# Patient Record
Sex: Female | Born: 1935 | Race: White | Hispanic: No | Smoking: Never smoker
Health system: Southern US, Community
[De-identification: ages and names within clinical notes are randomized; demographics above are authoritative.]

## PROBLEM LIST (undated history)

## (undated) DIAGNOSIS — M1990 Unspecified osteoarthritis, unspecified site: Secondary | ICD-10-CM

## (undated) DIAGNOSIS — D649 Anemia, unspecified: Secondary | ICD-10-CM

## (undated) DIAGNOSIS — E785 Hyperlipidemia, unspecified: Secondary | ICD-10-CM

## (undated) DIAGNOSIS — S32511A Fracture of superior rim of right pubis, initial encounter for closed fracture: Secondary | ICD-10-CM

## (undated) DIAGNOSIS — F419 Anxiety disorder, unspecified: Secondary | ICD-10-CM

## (undated) DIAGNOSIS — O2230 Deep phlebothrombosis in pregnancy, unspecified trimester: Secondary | ICD-10-CM

## (undated) DIAGNOSIS — Z950 Presence of cardiac pacemaker: Secondary | ICD-10-CM

## (undated) DIAGNOSIS — I517 Cardiomegaly: Secondary | ICD-10-CM

## (undated) DIAGNOSIS — S72142A Displaced intertrochanteric fracture of left femur, initial encounter for closed fracture: Secondary | ICD-10-CM

## (undated) DIAGNOSIS — F1020 Alcohol dependence, uncomplicated: Secondary | ICD-10-CM

## (undated) DIAGNOSIS — F32A Depression, unspecified: Secondary | ICD-10-CM

## (undated) DIAGNOSIS — S32059A Unspecified fracture of fifth lumbar vertebra, initial encounter for closed fracture: Secondary | ICD-10-CM

## (undated) DIAGNOSIS — I219 Acute myocardial infarction, unspecified: Secondary | ICD-10-CM

## (undated) DIAGNOSIS — M4800 Spinal stenosis, site unspecified: Secondary | ICD-10-CM

## (undated) DIAGNOSIS — G4700 Insomnia, unspecified: Secondary | ICD-10-CM

## (undated) DIAGNOSIS — I7090 Unspecified atherosclerosis: Secondary | ICD-10-CM

## (undated) DIAGNOSIS — S329XXA Fracture of unspecified parts of lumbosacral spine and pelvis, initial encounter for closed fracture: Secondary | ICD-10-CM

## (undated) DIAGNOSIS — I82409 Acute embolism and thrombosis of unspecified deep veins of unspecified lower extremity: Secondary | ICD-10-CM

## (undated) DIAGNOSIS — S5291XA Unspecified fracture of right forearm, initial encounter for closed fracture: Secondary | ICD-10-CM

## (undated) DIAGNOSIS — R2681 Unsteadiness on feet: Secondary | ICD-10-CM

## (undated) DIAGNOSIS — I2510 Atherosclerotic heart disease of native coronary artery without angina pectoris: Secondary | ICD-10-CM

## (undated) DIAGNOSIS — M21371 Foot drop, right foot: Secondary | ICD-10-CM

## (undated) DIAGNOSIS — K219 Gastro-esophageal reflux disease without esophagitis: Secondary | ICD-10-CM

## (undated) DIAGNOSIS — G319 Degenerative disease of nervous system, unspecified: Secondary | ICD-10-CM

## (undated) DIAGNOSIS — K5900 Constipation, unspecified: Secondary | ICD-10-CM

## (undated) DIAGNOSIS — F329 Major depressive disorder, single episode, unspecified: Secondary | ICD-10-CM

## (undated) DIAGNOSIS — C50919 Malignant neoplasm of unspecified site of unspecified female breast: Secondary | ICD-10-CM

## (undated) DIAGNOSIS — S3210XA Unspecified fracture of sacrum, initial encounter for closed fracture: Secondary | ICD-10-CM

## (undated) DIAGNOSIS — Z9181 History of falling: Secondary | ICD-10-CM

## (undated) DIAGNOSIS — E559 Vitamin D deficiency, unspecified: Principal | ICD-10-CM

## (undated) DIAGNOSIS — S32009A Unspecified fracture of unspecified lumbar vertebra, initial encounter for closed fracture: Secondary | ICD-10-CM

## (undated) DIAGNOSIS — Z7901 Long term (current) use of anticoagulants: Secondary | ICD-10-CM

## (undated) DIAGNOSIS — N390 Urinary tract infection, site not specified: Secondary | ICD-10-CM

## (undated) DIAGNOSIS — M21372 Foot drop, left foot: Secondary | ICD-10-CM

## (undated) DIAGNOSIS — I679 Cerebrovascular disease, unspecified: Secondary | ICD-10-CM

## (undated) DIAGNOSIS — R001 Bradycardia, unspecified: Secondary | ICD-10-CM

## (undated) DIAGNOSIS — I209 Angina pectoris, unspecified: Secondary | ICD-10-CM

## (undated) DIAGNOSIS — E538 Deficiency of other specified B group vitamins: Principal | ICD-10-CM

## (undated) DIAGNOSIS — C801 Malignant (primary) neoplasm, unspecified: Secondary | ICD-10-CM

## (undated) DIAGNOSIS — I2699 Other pulmonary embolism without acute cor pulmonale: Secondary | ICD-10-CM

## (undated) DIAGNOSIS — I10 Essential (primary) hypertension: Secondary | ICD-10-CM

## (undated) HISTORY — DX: Unspecified fracture of unspecified lumbar vertebra, initial encounter for closed fracture: S32.009A

## (undated) HISTORY — DX: Unsteadiness on feet: R26.81

## (undated) HISTORY — DX: Constipation, unspecified: K59.00

## (undated) HISTORY — PX: HIP FRACTURE SURGERY: SHX118

## (undated) HISTORY — DX: Displaced intertrochanteric fracture of left femur, initial encounter for closed fracture: S72.142A

## (undated) HISTORY — DX: Vitamin D deficiency, unspecified: E55.9

## (undated) HISTORY — DX: Fracture of superior rim of right pubis, initial encounter for closed fracture: S32.511A

## (undated) HISTORY — DX: Unspecified atherosclerosis: I70.90

## (undated) HISTORY — PX: ELBOW SURGERY: SHX618

## (undated) HISTORY — DX: History of falling: Z91.81

## (undated) HISTORY — DX: Acute myocardial infarction, unspecified: I21.9

## (undated) HISTORY — DX: Insomnia, unspecified: G47.00

## (undated) HISTORY — DX: Cardiomegaly: I51.7

## (undated) HISTORY — DX: Cerebrovascular disease, unspecified: I67.9

## (undated) HISTORY — DX: Degenerative disease of nervous system, unspecified: G31.9

## (undated) HISTORY — PX: ABDOMINAL HYSTERECTOMY: SHX81

## (undated) HISTORY — DX: Atherosclerotic heart disease of native coronary artery without angina pectoris: I25.10

## (undated) HISTORY — DX: Foot drop, right foot: M21.371

## (undated) HISTORY — DX: Deficiency of other specified B group vitamins: E53.8

## (undated) HISTORY — DX: Fracture of unspecified parts of lumbosacral spine and pelvis, initial encounter for closed fracture: S32.9XXA

## (undated) HISTORY — DX: Unspecified fracture of sacrum, initial encounter for closed fracture: S32.10XA

## (undated) HISTORY — PX: CATARACT EXTRACTION: SUR2

## (undated) HISTORY — DX: Anemia, unspecified: D64.9

## (undated) HISTORY — DX: Unspecified fracture of right forearm, initial encounter for closed fracture: S52.91XA

## (undated) HISTORY — DX: Alcohol dependence, uncomplicated: F10.20

## (undated) HISTORY — DX: Unspecified fracture of fifth lumbar vertebra, initial encounter for closed fracture: S32.059A

## (undated) HISTORY — DX: Unspecified osteoarthritis, unspecified site: M19.90

## (undated) HISTORY — DX: Long term (current) use of anticoagulants: Z79.01

## (undated) HISTORY — DX: Urinary tract infection, site not specified: N39.0

---
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
## 2000-02-27 ENCOUNTER — Encounter: Admission: RE | Admit: 2000-02-27 | Discharge: 2000-02-27 | Payer: Self-pay | Admitting: Gastroenterology

## 2000-02-27 ENCOUNTER — Encounter: Payer: Self-pay | Admitting: Gastroenterology

## 2000-11-07 ENCOUNTER — Encounter: Payer: Self-pay | Admitting: Gastroenterology

## 2000-11-07 ENCOUNTER — Encounter: Admission: RE | Admit: 2000-11-07 | Discharge: 2000-11-07 | Payer: Self-pay | Admitting: Gastroenterology

## 2001-03-17 ENCOUNTER — Encounter: Admission: RE | Admit: 2001-03-17 | Discharge: 2001-03-17 | Payer: Self-pay | Admitting: Gastroenterology

## 2001-03-17 ENCOUNTER — Encounter: Payer: Self-pay | Admitting: Gastroenterology

## 2003-03-15 ENCOUNTER — Ambulatory Visit (HOSPITAL_COMMUNITY): Admission: RE | Admit: 2003-03-15 | Discharge: 2003-03-15 | Payer: Self-pay | Admitting: Gastroenterology

## 2003-03-15 ENCOUNTER — Encounter: Payer: Self-pay | Admitting: Gastroenterology

## 2003-03-21 ENCOUNTER — Encounter (INDEPENDENT_AMBULATORY_CARE_PROVIDER_SITE_OTHER): Payer: Self-pay | Admitting: Specialist

## 2003-03-21 ENCOUNTER — Encounter: Payer: Self-pay | Admitting: Gastroenterology

## 2003-03-21 ENCOUNTER — Encounter: Admission: RE | Admit: 2003-03-21 | Discharge: 2003-03-21 | Payer: Self-pay | Admitting: Gastroenterology

## 2003-03-21 ENCOUNTER — Encounter (INDEPENDENT_AMBULATORY_CARE_PROVIDER_SITE_OTHER): Payer: Self-pay | Admitting: Radiology

## 2003-04-04 ENCOUNTER — Encounter: Admission: RE | Admit: 2003-04-04 | Discharge: 2003-04-04 | Payer: Self-pay | Admitting: General Surgery

## 2003-04-04 ENCOUNTER — Encounter: Payer: Self-pay | Admitting: General Surgery

## 2003-04-06 ENCOUNTER — Ambulatory Visit (HOSPITAL_COMMUNITY): Admission: RE | Admit: 2003-04-06 | Discharge: 2003-04-06 | Payer: Self-pay | Admitting: General Surgery

## 2003-04-06 ENCOUNTER — Encounter: Admission: RE | Admit: 2003-04-06 | Discharge: 2003-04-06 | Payer: Self-pay | Admitting: General Surgery

## 2003-04-06 ENCOUNTER — Ambulatory Visit (HOSPITAL_BASED_OUTPATIENT_CLINIC_OR_DEPARTMENT_OTHER): Admission: RE | Admit: 2003-04-06 | Discharge: 2003-04-06 | Payer: Self-pay | Admitting: General Surgery

## 2003-04-06 ENCOUNTER — Encounter: Payer: Self-pay | Admitting: General Surgery

## 2003-04-06 ENCOUNTER — Encounter (INDEPENDENT_AMBULATORY_CARE_PROVIDER_SITE_OTHER): Payer: Self-pay | Admitting: *Deleted

## 2003-04-14 ENCOUNTER — Ambulatory Visit: Admission: RE | Admit: 2003-04-14 | Discharge: 2003-06-23 | Payer: Self-pay | Admitting: Radiation Oncology

## 2003-08-16 ENCOUNTER — Encounter: Admission: RE | Admit: 2003-08-16 | Discharge: 2003-08-16 | Payer: Self-pay | Admitting: General Surgery

## 2003-12-26 ENCOUNTER — Encounter: Admission: RE | Admit: 2003-12-26 | Discharge: 2003-12-26 | Payer: Self-pay | Admitting: Sports Medicine

## 2004-03-22 ENCOUNTER — Encounter: Admission: RE | Admit: 2004-03-22 | Discharge: 2004-03-22 | Payer: Self-pay | Admitting: Sports Medicine

## 2004-04-06 ENCOUNTER — Encounter: Admission: RE | Admit: 2004-04-06 | Discharge: 2004-04-06 | Payer: Self-pay | Admitting: General Surgery

## 2004-05-31 ENCOUNTER — Encounter: Admission: RE | Admit: 2004-05-31 | Discharge: 2004-05-31 | Payer: Self-pay | Admitting: Sports Medicine

## 2004-07-18 ENCOUNTER — Encounter: Admission: RE | Admit: 2004-07-18 | Discharge: 2004-07-18 | Payer: Self-pay | Admitting: Sports Medicine

## 2004-10-03 ENCOUNTER — Encounter: Admission: RE | Admit: 2004-10-03 | Discharge: 2004-10-03 | Payer: Self-pay | Admitting: Sports Medicine

## 2004-11-15 ENCOUNTER — Encounter: Admission: RE | Admit: 2004-11-15 | Discharge: 2004-11-15 | Payer: Self-pay | Admitting: Sports Medicine

## 2004-12-24 ENCOUNTER — Ambulatory Visit: Payer: Self-pay | Admitting: Oncology

## 2005-01-17 ENCOUNTER — Encounter: Admission: RE | Admit: 2005-01-17 | Discharge: 2005-01-17 | Payer: Self-pay | Admitting: Sports Medicine

## 2005-04-19 ENCOUNTER — Encounter: Admission: RE | Admit: 2005-04-19 | Discharge: 2005-04-19 | Payer: Self-pay | Admitting: Gastroenterology

## 2005-05-21 ENCOUNTER — Encounter: Admission: RE | Admit: 2005-05-21 | Discharge: 2005-05-21 | Payer: Self-pay | Admitting: Sports Medicine

## 2005-07-09 ENCOUNTER — Encounter: Admission: RE | Admit: 2005-07-09 | Discharge: 2005-07-09 | Payer: Self-pay | Admitting: Sports Medicine

## 2005-09-12 ENCOUNTER — Encounter: Admission: RE | Admit: 2005-09-12 | Discharge: 2005-09-12 | Payer: Self-pay | Admitting: Gastroenterology

## 2005-09-18 ENCOUNTER — Encounter: Admission: RE | Admit: 2005-09-18 | Discharge: 2005-09-18 | Payer: Self-pay | Admitting: Sports Medicine

## 2005-10-08 ENCOUNTER — Encounter: Admission: RE | Admit: 2005-10-08 | Discharge: 2005-10-08 | Payer: Self-pay | Admitting: Sports Medicine

## 2005-12-19 ENCOUNTER — Encounter: Admission: RE | Admit: 2005-12-19 | Discharge: 2005-12-19 | Payer: Self-pay | Admitting: Sports Medicine

## 2005-12-27 ENCOUNTER — Ambulatory Visit: Payer: Self-pay | Admitting: Oncology

## 2006-01-06 LAB — CANCER ANTIGEN 27.29: CA 27.29: 25 U/mL (ref 0–39)

## 2006-01-06 LAB — CBC WITH DIFFERENTIAL/PLATELET
Basophils Absolute: 0 10*3/uL (ref 0.0–0.1)
EOS%: 0.5 % (ref 0.0–7.0)
HCT: 42.5 % (ref 34.8–46.6)
HGB: 14.5 g/dL (ref 11.6–15.9)
LYMPH%: 23 % (ref 14.0–48.0)
MCH: 32.8 pg (ref 26.0–34.0)
MCHC: 34.1 g/dL (ref 32.0–36.0)
MONO#: 0.8 10*3/uL (ref 0.1–0.9)
NEUT%: 68.1 % (ref 39.6–76.8)
Platelets: 278 10*3/uL (ref 145–400)
lymph#: 2.4 10*3/uL (ref 0.9–3.3)

## 2006-01-06 LAB — COMPREHENSIVE METABOLIC PANEL
BUN: 16 mg/dL (ref 6–23)
CO2: 26 mEq/L (ref 19–32)
Calcium: 9.1 mg/dL (ref 8.4–10.5)
Chloride: 104 mEq/L (ref 96–112)
Creatinine, Ser: 0.58 mg/dL (ref 0.40–1.20)
Total Bilirubin: 0.5 mg/dL (ref 0.3–1.2)

## 2006-04-28 ENCOUNTER — Encounter: Admission: RE | Admit: 2006-04-28 | Discharge: 2006-04-28 | Payer: Self-pay | Admitting: General Surgery

## 2006-05-19 ENCOUNTER — Encounter: Admission: RE | Admit: 2006-05-19 | Discharge: 2006-05-19 | Payer: Self-pay | Admitting: Sports Medicine

## 2006-08-12 ENCOUNTER — Encounter: Admission: RE | Admit: 2006-08-12 | Discharge: 2006-08-12 | Payer: Self-pay | Admitting: Sports Medicine

## 2006-10-16 ENCOUNTER — Encounter: Admission: RE | Admit: 2006-10-16 | Discharge: 2006-10-16 | Payer: Self-pay | Admitting: Sports Medicine

## 2007-01-16 ENCOUNTER — Ambulatory Visit: Payer: Self-pay | Admitting: Oncology

## 2007-01-21 LAB — COMPREHENSIVE METABOLIC PANEL
ALT: 9 U/L (ref 0–35)
AST: 13 U/L (ref 0–37)
Albumin: 4.2 g/dL (ref 3.5–5.2)
Alkaline Phosphatase: 135 U/L — ABNORMAL HIGH (ref 39–117)
BUN: 12 mg/dL (ref 6–23)
CO2: 25 mEq/L (ref 19–32)
Calcium: 8.7 mg/dL (ref 8.4–10.5)
Chloride: 109 mEq/L (ref 96–112)
Creatinine, Ser: 0.61 mg/dL (ref 0.40–1.20)
Glucose, Bld: 84 mg/dL (ref 70–99)
Potassium: 4.1 mEq/L (ref 3.5–5.3)
Sodium: 142 mEq/L (ref 135–145)
Total Bilirubin: 0.4 mg/dL (ref 0.3–1.2)
Total Protein: 6.4 g/dL (ref 6.0–8.3)

## 2007-01-21 LAB — CBC WITH DIFFERENTIAL/PLATELET
Basophils Absolute: 0 10*3/uL (ref 0.0–0.1)
EOS%: 1.2 % (ref 0.0–7.0)
Eosinophils Absolute: 0.1 10*3/uL (ref 0.0–0.5)
HCT: 40.7 % (ref 34.8–46.6)
HGB: 13.9 g/dL (ref 11.6–15.9)
MONO#: 0.5 10*3/uL (ref 0.1–0.9)
NEUT#: 3.1 10*3/uL (ref 1.5–6.5)
RDW: 13.6 % (ref 11.3–14.5)
lymph#: 1.6 10*3/uL (ref 0.9–3.3)

## 2007-01-21 LAB — CANCER ANTIGEN 27.29: CA 27.29: 21 U/mL (ref 0–39)

## 2007-05-06 ENCOUNTER — Encounter: Admission: RE | Admit: 2007-05-06 | Discharge: 2007-05-06 | Payer: Self-pay | Admitting: Gastroenterology

## 2007-05-18 ENCOUNTER — Encounter: Admission: RE | Admit: 2007-05-18 | Discharge: 2007-05-18 | Payer: Self-pay | Admitting: Sports Medicine

## 2007-09-22 ENCOUNTER — Encounter: Admission: RE | Admit: 2007-09-22 | Discharge: 2007-09-22 | Payer: Self-pay | Admitting: Sports Medicine

## 2008-01-19 ENCOUNTER — Ambulatory Visit: Payer: Self-pay | Admitting: Oncology

## 2008-01-21 LAB — CBC WITH DIFFERENTIAL/PLATELET
BASO%: 0.4 % (ref 0.0–2.0)
EOS%: 1.2 % (ref 0.0–7.0)
LYMPH%: 29.8 % (ref 14.0–48.0)
MCH: 32 pg (ref 26.0–34.0)
MCHC: 34.1 g/dL (ref 32.0–36.0)
MCV: 93.8 fL (ref 81.0–101.0)
MONO%: 7.1 % (ref 0.0–13.0)
NEUT#: 4.1 10*3/uL (ref 1.5–6.5)
Platelets: 274 10*3/uL (ref 145–400)
RBC: 4.03 10*6/uL (ref 3.70–5.32)
RDW: 13.3 % (ref 11.3–14.5)

## 2008-01-22 LAB — COMPREHENSIVE METABOLIC PANEL
AST: 13 U/L (ref 0–37)
Albumin: 4.1 g/dL (ref 3.5–5.2)
Alkaline Phosphatase: 68 U/L (ref 39–117)
Glucose, Bld: 87 mg/dL (ref 70–99)
Potassium: 3.8 mEq/L (ref 3.5–5.3)
Sodium: 140 mEq/L (ref 135–145)
Total Bilirubin: 0.4 mg/dL (ref 0.3–1.2)
Total Protein: 6.2 g/dL (ref 6.0–8.3)

## 2008-01-22 LAB — VITAMIN D 25 HYDROXY (VIT D DEFICIENCY, FRACTURES): Vit D, 25-Hydroxy: 34 ng/mL (ref 30–89)

## 2008-03-30 ENCOUNTER — Encounter: Admission: RE | Admit: 2008-03-30 | Discharge: 2008-03-30 | Payer: Self-pay | Admitting: Sports Medicine

## 2008-06-07 ENCOUNTER — Encounter: Admission: RE | Admit: 2008-06-07 | Discharge: 2008-06-07 | Payer: Self-pay | Admitting: Gastroenterology

## 2008-07-13 ENCOUNTER — Encounter: Admission: RE | Admit: 2008-07-13 | Discharge: 2008-07-13 | Payer: Self-pay | Admitting: Sports Medicine

## 2008-10-18 ENCOUNTER — Encounter: Admission: RE | Admit: 2008-10-18 | Discharge: 2008-10-18 | Payer: Self-pay | Admitting: Sports Medicine

## 2009-01-19 ENCOUNTER — Encounter: Admission: RE | Admit: 2009-01-19 | Discharge: 2009-01-19 | Payer: Self-pay | Admitting: Sports Medicine

## 2009-01-20 ENCOUNTER — Ambulatory Visit: Payer: Self-pay | Admitting: Oncology

## 2009-01-25 LAB — CBC WITH DIFFERENTIAL/PLATELET
Basophils Absolute: 0 10*3/uL (ref 0.0–0.1)
EOS%: 0.1 % (ref 0.0–7.0)
Eosinophils Absolute: 0 10*3/uL (ref 0.0–0.5)
HGB: 13.8 g/dL (ref 11.6–15.9)
MCH: 32.7 pg (ref 25.1–34.0)
NEUT#: 9.5 10*3/uL — ABNORMAL HIGH (ref 1.5–6.5)
RBC: 4.23 10*6/uL (ref 3.70–5.45)
RDW: 13.8 % (ref 11.2–14.5)
lymph#: 1.5 10*3/uL (ref 0.9–3.3)

## 2009-01-25 LAB — COMPREHENSIVE METABOLIC PANEL
ALT: 23 U/L (ref 0–35)
AST: 25 U/L (ref 0–37)
Albumin: 3.8 g/dL (ref 3.5–5.2)
Alkaline Phosphatase: 65 U/L (ref 39–117)
BUN: 29 mg/dL — ABNORMAL HIGH (ref 6–23)
Calcium: 9 mg/dL (ref 8.4–10.5)
Chloride: 101 mEq/L (ref 96–112)
Potassium: 3.4 mEq/L — ABNORMAL LOW (ref 3.5–5.3)
Sodium: 136 mEq/L (ref 135–145)
Total Protein: 6.7 g/dL (ref 6.0–8.3)

## 2009-04-18 ENCOUNTER — Encounter: Admission: RE | Admit: 2009-04-18 | Discharge: 2009-04-18 | Payer: Self-pay | Admitting: Sports Medicine

## 2009-06-09 ENCOUNTER — Encounter: Admission: RE | Admit: 2009-06-09 | Discharge: 2009-06-09 | Payer: Self-pay | Admitting: Oncology

## 2009-06-20 ENCOUNTER — Encounter: Admission: RE | Admit: 2009-06-20 | Discharge: 2009-06-20 | Payer: Self-pay

## 2009-07-01 DIAGNOSIS — I82409 Acute embolism and thrombosis of unspecified deep veins of unspecified lower extremity: Secondary | ICD-10-CM

## 2009-07-01 DIAGNOSIS — S32511A Fracture of superior rim of right pubis, initial encounter for closed fracture: Secondary | ICD-10-CM

## 2009-07-01 HISTORY — DX: Fracture of superior rim of right pubis, initial encounter for closed fracture: S32.511A

## 2009-07-01 HISTORY — DX: Acute embolism and thrombosis of unspecified deep veins of unspecified lower extremity: I82.409

## 2009-08-25 ENCOUNTER — Encounter: Admission: RE | Admit: 2009-08-25 | Discharge: 2009-08-25 | Payer: Self-pay | Admitting: Sports Medicine

## 2009-10-02 ENCOUNTER — Encounter: Admission: RE | Admit: 2009-10-02 | Discharge: 2009-10-02 | Payer: Self-pay | Admitting: Internal Medicine

## 2010-02-08 ENCOUNTER — Ambulatory Visit: Payer: Self-pay | Admitting: Oncology

## 2010-02-12 LAB — COMPREHENSIVE METABOLIC PANEL
Albumin: 4.4 g/dL (ref 3.5–5.2)
BUN: 24 mg/dL — ABNORMAL HIGH (ref 6–23)
CO2: 26 mEq/L (ref 19–32)
Calcium: 9.8 mg/dL (ref 8.4–10.5)
Chloride: 101 mEq/L (ref 96–112)
Creatinine, Ser: 1.21 mg/dL — ABNORMAL HIGH (ref 0.40–1.20)
Potassium: 3.6 mEq/L (ref 3.5–5.3)

## 2010-02-12 LAB — CBC WITH DIFFERENTIAL/PLATELET
Basophils Absolute: 0 10*3/uL (ref 0.0–0.1)
Eosinophils Absolute: 0.1 10*3/uL (ref 0.0–0.5)
HCT: 41.2 % (ref 34.8–46.6)
HGB: 13.5 g/dL (ref 11.6–15.9)
MONO#: 0.5 10*3/uL (ref 0.1–0.9)
NEUT#: 4.5 10*3/uL (ref 1.5–6.5)
NEUT%: 63.9 % (ref 38.4–76.8)
WBC: 7 10*3/uL (ref 3.9–10.3)
lymph#: 1.9 10*3/uL (ref 0.9–3.3)

## 2010-02-12 LAB — CANCER ANTIGEN 27.29: CA 27.29: 32 U/mL (ref 0–39)

## 2010-07-01 DIAGNOSIS — I2699 Other pulmonary embolism without acute cor pulmonale: Secondary | ICD-10-CM

## 2010-07-01 HISTORY — DX: Other pulmonary embolism without acute cor pulmonale: I26.99

## 2010-07-11 ENCOUNTER — Encounter
Admission: RE | Admit: 2010-07-11 | Discharge: 2010-07-11 | Payer: Self-pay | Source: Home / Self Care | Attending: Family Medicine | Admitting: Family Medicine

## 2010-07-22 ENCOUNTER — Encounter: Payer: Self-pay | Admitting: Sports Medicine

## 2010-07-22 ENCOUNTER — Encounter: Payer: Self-pay | Admitting: Gastroenterology

## 2010-07-24 ENCOUNTER — Inpatient Hospital Stay (HOSPITAL_COMMUNITY)
Admission: EM | Admit: 2010-07-24 | Discharge: 2010-07-27 | Payer: Self-pay | Source: Home / Self Care | Attending: Internal Medicine | Admitting: Internal Medicine

## 2010-07-25 LAB — CK TOTAL AND CKMB (NOT AT ARMC)
CK, MB: 6.4 ng/mL (ref 0.3–4.0)
CK, MB: 8.8 ng/mL (ref 0.3–4.0)
Relative Index: 7.8 — ABNORMAL HIGH (ref 0.0–2.5)
Total CK: 114 U/L (ref 7–177)
Total CK: 115 U/L (ref 7–177)
Total CK: 119 U/L (ref 7–177)

## 2010-07-25 LAB — BASIC METABOLIC PANEL
CO2: 24 mEq/L (ref 19–32)
Calcium: 9 mg/dL (ref 8.4–10.5)
Chloride: 99 mEq/L (ref 96–112)
Creatinine, Ser: 1.11 mg/dL (ref 0.4–1.2)
Potassium: 3.3 mEq/L — ABNORMAL LOW (ref 3.5–5.1)
Sodium: 135 mEq/L (ref 135–145)

## 2010-07-25 LAB — TROPONIN I
Troponin I: 1.13 ng/mL (ref 0.00–0.06)
Troponin I: 1.16 ng/mL (ref 0.00–0.06)

## 2010-07-25 LAB — URINALYSIS, ROUTINE W REFLEX MICROSCOPIC
Specific Gravity, Urine: 1.013 (ref 1.005–1.030)
Urine Glucose, Fasting: NEGATIVE mg/dL
Urobilinogen, UA: 0.2 mg/dL (ref 0.0–1.0)

## 2010-07-25 LAB — HEMOGLOBIN A1C
Hgb A1c MFr Bld: 5.6 % (ref <5.7)
Mean Plasma Glucose: 114 mg/dL (ref <117)

## 2010-07-25 LAB — POCT CARDIAC MARKERS
CKMB, poc: 6.8 ng/mL (ref 1.0–8.0)
Myoglobin, poc: 457 ng/mL (ref 12–200)
Troponin i, poc: 0.4 ng/mL (ref 0.00–0.09)
Troponin i, poc: 0.64 ng/mL (ref 0.00–0.09)

## 2010-07-25 LAB — DIFFERENTIAL
Basophils Absolute: 0 10*3/uL (ref 0.0–0.1)
Eosinophils Absolute: 0 10*3/uL (ref 0.0–0.7)
Lymphocytes Relative: 5 % — ABNORMAL LOW (ref 12–46)
Neutro Abs: 16.4 10*3/uL — ABNORMAL HIGH (ref 1.7–7.7)
Neutrophils Relative %: 85 % — ABNORMAL HIGH (ref 43–77)

## 2010-07-25 LAB — HEPARIN LEVEL (UNFRACTIONATED)
Heparin Unfractionated: 0.32 IU/mL (ref 0.30–0.70)
Heparin Unfractionated: 0.27 IU/mL — ABNORMAL LOW (ref 0.30–0.70)

## 2010-07-25 LAB — MRSA PCR SCREENING: MRSA by PCR: NEGATIVE

## 2010-07-25 LAB — LIPID PANEL
HDL: 111 mg/dL (ref >39)
VLDL: 15 mg/dL (ref 0–40)

## 2010-07-25 LAB — URINE MICROSCOPIC-ADD ON

## 2010-07-25 LAB — CBC: Platelets: 165 10*3/uL (ref 150–400)

## 2010-07-25 LAB — PROTIME-INR: Prothrombin Time: 14.1 seconds (ref 11.6–15.2)

## 2010-07-26 LAB — CARDIAC PANEL(CRET KIN+CKTOT+MB+TROPI)
CK, MB: 4.8 ng/mL — ABNORMAL HIGH (ref 0.3–4.0)
Relative Index: 4.3 — ABNORMAL HIGH (ref 0.0–2.5)
Relative Index: 4.4 — ABNORMAL HIGH (ref 0.0–2.5)
Total CK: 108 U/L (ref 7–177)
Troponin I: 0.41 ng/mL — ABNORMAL HIGH (ref 0.00–0.06)

## 2010-07-26 LAB — BASIC METABOLIC PANEL
CO2: 23 mEq/L (ref 19–32)
Chloride: 111 mEq/L (ref 96–112)
Creatinine, Ser: 0.8 mg/dL (ref 0.4–1.2)
GFR calc Af Amer: >60 mL/min (ref >60)
Potassium: 4.9 mEq/L (ref 3.5–5.1)
Sodium: 142 mEq/L (ref 135–145)

## 2010-07-26 LAB — PROTIME-INR: Prothrombin Time: 14.6 seconds (ref 11.6–15.2)

## 2010-07-26 LAB — CBC
HCT: 36.2 % (ref 36.0–46.0)
Hemoglobin: 12.1 g/dL (ref 12.0–15.0)
MCHC: 33.4 g/dL (ref 30.0–36.0)
RBC: 3.88 MIL/uL (ref 3.87–5.11)

## 2010-07-26 LAB — HEPARIN LEVEL (UNFRACTIONATED): Heparin Unfractionated: 0.43 IU/mL (ref 0.30–0.70)

## 2010-07-26 LAB — LIPID PANEL
Total CHOL/HDL Ratio: 2.1 RATIO
VLDL: 15 mg/dL (ref 0–40)

## 2010-07-26 LAB — MAGNESIUM: Magnesium: 1.7 mg/dL (ref 1.5–2.5)

## 2010-07-27 LAB — CBC
HCT: 32.7 % — ABNORMAL LOW (ref 36.0–46.0)
Hemoglobin: 11 g/dL — ABNORMAL LOW (ref 12.0–15.0)
MCHC: 33.6 g/dL (ref 30.0–36.0)
RBC: 3.5 MIL/uL — ABNORMAL LOW (ref 3.87–5.11)
WBC: 10.4 10*3/uL (ref 4.0–10.5)

## 2010-07-27 LAB — PROTIME-INR: INR: 1.76 — ABNORMAL HIGH (ref 0.00–1.49)

## 2010-07-27 LAB — BASIC METABOLIC PANEL
CO2: 25 mEq/L (ref 19–32)
Calcium: 8.9 mg/dL (ref 8.4–10.5)
GFR calc Af Amer: >60 mL/min (ref >60)
Glucose, Bld: 121 mg/dL — ABNORMAL HIGH (ref 70–99)
Potassium: 4.7 mEq/L (ref 3.5–5.1)
Sodium: 142 mEq/L (ref 135–145)

## 2010-07-27 LAB — URINE CULTURE
Colony Count: NO GROWTH
Culture: NO GROWTH
Special Requests: NEGATIVE

## 2010-07-27 NOTE — Discharge Summary (Signed)
Cathy Wallace, Cathy Wallace             ACCOUNT NO.:  [id number]  MEDICAL RECORD NO.:  [id number]          PATIENT TYPE:  INP  LOCATION:  1224                         FACILITY:  [facility]  PHYSICIAN:  Andreas Blower, MD       DATE OF BIRTH:  1917-05-02  DATE OF ADMISSION:  07/24/2010 DATE OF DISCHARGE:                              DISCHARGE SUMMARY   PRIMARY CARE PHYSICIAN:  Dr. Elby Showers.  CARDIOLOGIST:  The patient's cardiologist is [facility].  DISCHARGE DIAGNOSES: 1. Extensive bilateral pulmonary embolisms. 2. Syncope. 3. Minimal nasal fractures that are not grossly displaced. 4. Mild hypotension. 5. Leukocytosis. 6. Urinary tract infection. 7. Spinal stenosis. 8. Bradycardia. 9. Prior history of deep vein thrombosis approximately a year ago,     completed 76-month course of Coumadin. 10.Non-ST-elevation myocardial infarction due to pulmonary embolism. 11.Hypertension.  DISCHARGE MEDICATIONS: 1. Aspirin 81 mg p.o. daily to continue for 7 days, will defer to her     primary care physician whether to discontinue aspirin in light of     Coumadin. 2. Ciprofloxacin 500 mg p.o. daily for one more day. 3. Lovenox 600 mg p.o. twice daily for at least 3 more days, to be     discontinued after INR is greater than 2.0. 4. Warfarin 2 mg p.o. q.p.m. 5. Calcium with vitamin D 600 mg p.o. daily 6. Diltiazem extended release 240 mg p.o. daily 7. Loratadine 10 mg daily as needed. 8. Fioricet 2 tablets p.o. twice daily. 9. Gabapentin 300 mg p.o. 3 times a daily. 10.Imodium 1 mg daily as needed for diarrhea. 11.Ativan 1 mg 1 to 2 tablets 3 times day a day as needed for anxiety. 12.Omeprazole 20 mg p.o. daily 13.Tramadol/acetaminophen 37.5-325 mg p.o. twice daily as needed for     pain. 14.Triamterene/hydrochlorothiazide 37.5/25 p.o. daily. 15.Visine over-the-counter 2 drops for dry eyes daily as needed. 16.Vitamin C over-the-counter p.o. 3 times a week on Monday,     Wednesday,  Friday. 17.Vitamin E over-the-counter 1 tablet p.o. 3 times a week Monday,     Wednesday, Friday.  BRIEF ADMITTING HISTORY AND PHYSICAL:  Cathy Wallace is a 77 year old Caucasian female who had a syncopal episode on July 24, 2010, with nondisplaced nasal fractures who presented on July 24, 2010.  RADIOLOGY/IMAGING: 1. The patient had chest x-ray, 2-view, which shows no active     cardiopulmonary disease. 2. The patient had a head CT without contrast which shows minimal     nasal fractures. 3. The patient had a CT of the head without contrast which showed no     acute findings. 4. The patient had a CT of the maxillofacial without contrast which     showed minimal nasal fracture. 5. The patient had x-ray of the left knee which showed no acute bony     abnormality. 6. The patient had a CT angiogram with contrast which showed extensive     bilateral pulmonary emboli.  Associated right ventricular strain     pattern with marked flattening of left ventricle. 7. The patient had a 2-D echocardiogram which showed left ventricular     cavity size was normal, wall  thickness was normal, systolic     function was vigorous, ejection fraction was 65% to 70%.  Aortic     valve showed mild regurgitation.  The mitral valve showed calcified     annulus.  Right ventricle cavity size was mildly dilated.  Systolic     function was mildly reduced.  Right atrium was moderately dilated.     Tricuspid valve showed moderate regurgitation.  HOSPITAL COURSE BY PROBLEM: 1. Extensive bilateral pulmonary embolism.  Initially, the patient was     started on heparin which was then transitioned to Lovenox and     Coumadin combination.  The patient will continue Lovenox atleast     until July 29, 2010.  Lovenox to be discontinued after INR is     therapeutic above 2.  Since this is the patient's second event with     second episode of spontaneous clot formation, the patient will most     likely need lifelong  Coumadin therapy. 2. Syncope most likely secondary to bilateral PE.  Head CT was     negative.  No further events on tele. 3. Non-ST-elevation MI.  Initially prior to CT angio was done which     revealed bilateral PE.  The patient had elevated troponin for which     she was started on heparin.  After CT angiogram was done, the     patient cause for elevated troponin was attributed to bilateral PE.     The patient during the course of hospital stay had [facility]     cardiology evaluate the patient.  The patient declined cath or     any aggressive cardiac workup. 4. Leukocytosis most likely reactive leukocytosis versus mild UTI. 5. Mild urinary tract infection.  The patient was started on     ciprofloxacin which she will continue for 1 more days to complete     at least 3-day course of antibiotics. 6. Mild hypotension, resolved during the course of hospital stay. 7. Bradycardia.  The patient was started on metoprolol from non-ST-     elevation MI, inaddition of home medication of diltiazem.     However, given the patient's age and heart rate being in the     mid 48s, decision was made to discontinue metoprolol.  The     patient will be continued on diltiazem. 8. Minimal nasal fractures that are nondisplaced, conservative manage     for now.  The patient has some ecchymosis bilaterally beside her     nose which, as of fact, will improve with time.  LABORATORY DATA:  The patient had a CBC of 10.4, hemoglobin 11.0, hematocrit 37.4, platelet count 198.  INR 1.76.  Electrolytes normal with a creatinine of 0.97.  Troponin initially was 1.16 which trended down to 0.41.  LDL was 83.  UA was negative for nitrites, had trace leukocytes.  DISPOSITION AND FOLLOWUP:  The patient to follow up with Dr. Clent Ridges in 1 week.  The patient to have her PT/INR checked at [facility] on July 30, 2010, or at her PCP's office on July 30, 2010 if it cannot be done at [facility].  Time spent on discharge,  talking to the patient, patient's daughter, and coordinating care was 35 minutes.   Andreas Blower, MD   SR/MEDQ  D:  07/27/2010  T:  07/27/2010  Job:  [id number]  cc:   Dr. Clent Ridges  Electronically Signed by Wardell Heath REDDY  on 07/27/2010 08:25:23 PM

## 2010-07-31 NOTE — Consult Note (Signed)
NAMEALEXANDERA, Wallace NO.:  [id number]  MEDICAL RECORD NO.:  [id number]          PATIENT TYPE:  INP  LOCATION:  1224                         FACILITY:  [facility]  PHYSICIAN:  Lonia Blood, M.D.DATE OF BIRTH:  1916-09-20  DATE OF CONSULTATION:  07/25/2010 DATE OF DISCHARGE:                                CONSULTATION   REFERRING PHYSICIAN:  Corky Crafts, MD  PRIMARY CARE PHYSICIAN:  Elby Showers, MD  REASON FOR CONSULTATION:  Newly diagnosed bilateral pulmonary emboli.  HISTORY OF PRESENT ILLNESS:  Ms. Cathy Wallace is a remarkably independent 77 year old female who enjoys very good health with exception to chronic lumbar spinal stenosis.  She suffered a syncopal spell on July 24, 2010, during which she experienced nondisplaced nasal bone fractures.  She apparently was ambulating and then simply woke up an unspecified time later on the floor with bleeding from her nose.  During her evaluation in the emergency room, she was found to have mildly elevated troponins and therefore Cardiology was called.  The patient was admitted to the acute unit and placed on heparin anticoagulation with a presumptive initial diagnosis of a non-ST elevation MI.  After her hospitalization, the cardiology group continued to pursue the diagnostic evaluation and ultimately a CT angio of the chest was accomplished, which revealed multiple bilateral pulmonary emboli.  There was also a suggestion of severe right ventricular strain and flattening of the left ventricle noted at the time of CT scanning. The patient has had an echocardiogram during her hospital stay, which reveals actually only a mildly dilated right ventricle and right atrium and preserved LV systolic function with an EF of 65%.  PA pressure is moderately elevated at 44.  The patient herself is doing remarkably well.  She had no time has had any chest pain whatsoever.  She denies any complaints of  shortness of breath.  She is not hypoxic.  She is suffering some relative hypotension given the fact that she does have a baseline history of hypertension, requiring medical therapy.  Of note is the fact that the patient was diagnosed with a DVT approximately 1 year ago.  This apparently was a spontaneous DVT and not associated with surgery, long travel, or other known clear precipitating factors.  The patient's only risk factor appears to be her spinal stenosis, which causes her to lead a rather inactive lifestyle, though she is quite unhappy about this.  Nonetheless with the  diagnosis of DVT, the patient received 9 months of Coumadin therapy.  This Coumadin therapy ceased approximately 3 months ago.  The patient has not noticed any recurrent swelling of the lower extremities with exception to transient bilateral ankle edema, which she states occurs when she is on her feet.  REVIEW OF SYSTEMS:  Comprehensive review of systems is carried out and is unremarkable with exception to the positive elements noted in history of present illness above.  PAST MEDICAL HISTORY: 1. Known lumbar spinal stenosis. 2. Hypertension. 3. Prior history of DVT.     a.     Diagnosed approximately 1 year ago.     b.     Completed 9  months of Coumadin therapy.     c.     Coumadin therapy discontinued approximately 3 months ago. 4. No code blue/do not resuscitate.  OUTPATIENT MEDICATIONS:  A complete list of the patient's outpatient medications is available in the chart.  It is fully reviewed by this dictating physician.  ALLERGIES:  No known drug allergies.  FAMILY HISTORY:  Noncontributory, but reviewed with the patient.  SOCIAL HISTORY:  The patient is a widow.  She is retired.  She does not smoke nor has she ever to an extent.  She does not drink alcohol to excess.  She lives at [facility] in independent living unit.  PHYSICAL EXAMINATION:  VITAL SIGNS:  Temperature of 98.0,  blood pressure 106/91, heart rate 69, respiratory rate 18, O2 saturation is 96% on room air. GENERAL:  A well-developed and well-nourished female, in no acute respiratory distress. HEENT:  Normocephalic, atraumatic.  Pupils equal, round, reactive to light and accommodation.  Extraocular muscles intact bilaterally.  OC/OP clear. NECK:  No lymphadenopathy.  No thyromegaly. LUNGS:  Clear to auscultation bilaterally without wheezes or rhonchi. CARDIOVASCULAR:  Regular rate and rhythm without murmur, gallop, or rub with normal S1 and S2. ABDOMEN:  Nontender, nondistended, soft.  Bowel sounds present.  No organomegaly.  No rebound.  No ascites.  EXTREMITIES:  No significant cyanosis, clubbing, edema bilateral lower extremities. NEUROLOGIC:  Nonfocal neurologic exam - the patient is alert and oriented x4.  DATA REVIEWED:  White count was elevated at admission at 19.3 with a normal hemoglobin, platelet count, and MCV.  Potassium was low at 3.3 with electrolytes otherwise being balanced.  Serum glucose was elevated at 168, hemoglobin A1c was noted to be 5.6.  TSH is within normal range. Urinalysis suggest possible cystitis.  CT angio of the chest reveals extensive bilateral pulmonary emboli with RV strain and flattening of the LV.  Echocardiogram reveals EF of 65% to 75% with normal LV function, PA pressure of 44 mmHg, and mildly dilated right ventricle and right atrium.  Most recent troponin was 1.16 with a downward trend and a CK total of 119 and MB of 9.3.  RECOMMENDATIONS: 1. Extensive bilateral pulmonary emboli with question of RV strain -     at the present time, the patient is hemodynamically stable.     Fortunately, she has been fully anticoagulated since time of her     admission on heparin out of concern for non-ST segment elevation     MI.  It would now appear clear that the patient's modest troponin     elevation was likely related to pulmonary emboli and not a coronary      syndrome at all.  Nonetheless, the treatment that she has received     has been appropriate and that she simply needs full     anticoagulation.  We will add Coumadin to her treatment regimen in     anticipation that she will now need lifelong Coumadin therapy with     this being her second episode of apparent spontaneous clot     formation.  Though CT angio suggested quite dramatic right     ventricular strain, the patient's echocardiogram does not     corroborate this evidence.  Clinically, the patient is stable.  As     a result, she clearly does not need TPA at this time.  In this     patient who has just suffered a fall and has a known nasal  fracture, one would argue against TPA anyway unless the patient was     clearly actively dying with hemodynamic collapse.  Fortunately,     that is not an issue that we have to address at this point as the     patient is, in fact, quite stable.  We will continue her heparin as     you have done.  We will add Coumadin to her regimen in anticipation     of ultimate discharge back to [facility].  She will likely need     Lovenox at the time of her discharge as I suspect she will be     stable for discharge before her Coumadin has reached its     therapeutic level. 2. Mild hypotension - the patient is mildly hypotensive given the fact     that she has a baseline hypertension.  This is most likely related     to poor return of blood to the left atrium due to the patient's     multiple pulmonary emboli.  We will need to ensure the patient     remains well hydrated with her newly acquired and hopefully short-     term relative right ventricular failure.  I will discontinue her     diuretic.  We will gently hydrate her given that we know she has a     preserved LV function.  We will watch her blood pressure closely. 3. Syncope - it appears quite clear that the patient likely suffers     syncope as a result of multiple showering pulmonary emboli.      Further evaluation is not needed at this time. 4. Spinal stenosis - we will continue the patient's home medication     regimen. 5. No code blue status/do not resuscitate - we will continue on the     patient's wishes as these are indeed appropriate in her setting.  Thank you very much for your consultation on this remarkable and very pleasant lady.  Given that her primary issue has now proven to be medically related and not directly related to her coronary arteries, I feel it is appropriate for [place] to assume her primary care during her hospital stay.  As a result, I will change her attending to that of [facility].  We appreciate the opportunity to participate in her care.     Lonia Blood, M.D.     JTM/MEDQ  D:  07/25/2010  T:  07/25/2010  Job:  [id number]  cc:   Elby Showers, MD Fax: [phone]  Electronically Signed by Jetty Duhamel M.D. on 07/31/2010 09:50:14 AM

## 2010-09-07 NOTE — H&P (Signed)
Cathy Wallace, Cathy Wallace NO.:  [id number]  MEDICAL RECORD NO.:  [id number]          PATIENT TYPE:  EMS  LOCATION:  ED                           FACILITY:  [facility]  PHYSICIAN:  Wendi Snipes, MD DATE OF BIRTH:  1917/03/03  DATE OF ADMISSION:  07/24/2010 DATE OF DISCHARGE:                             HISTORY & PHYSICAL   ADMITTING DOCTOR:  Georga Hacking, M.D.  PRIMARY CARE DOCTOR:  Dr. Clent Ridges  CHIEF COMPLAINTS:  Fall.  HISTORY OF PRESENT ILLNESS:  This is a 77 year old white female with a history of spinal stenosis, hypertension, here after a syncopal episode today.  She states she has been feeling poorly for 6 months and is experiencing daily slowly progressive pain from her lower back secondary to spinal stenosis.  She states today she was feeling particularly weak and she experienced a mechanical fall earlier on the day.  However, she was walking in later on and her next experience was waking up on the ground with a bloody nose.  Previous to this, she did not feel any chest pain, palpitations, and she has not experienced any of these symptoms recently.  She has recently stopped Coumadin for treating a DVT and has not experienced any shortness of breath or increased lower extremity edema.  She was evaluated in emergency department and was found to be chest pain free though her troponin was elevated on initial labs.  PAST MEDICAL HISTORY: 1. Hypertension. 2. Spinal stenosis. 3. History of DVT.  She recently stopped Coumadin 3 months ago after 9-     month therapy.  ALLERGIES:  No known drug allergies.  MEDICATIONS ON ADMISSION:  Ativan, Cardizem, Fioricet, hydrochlorothiazide/triamterene  SOCIAL HISTORY:  She lives in [place] alone.  She is retired.  She does not smoke.  FAMILY HISTORY:  Negative for premature coronary artery disease.  CODE STATUS:  She is a DNR.  REVIEW OF SYSTEMS:  All 14 systems were reviewed and were negative except  as mentioned in detail in HPI.  PHYSICAL EXAMINATION:  VITAL SIGNS:  Blood pressure is 105/54, respiratory rate 16, pulse 88, satting 94% on room air. GENERAL:  She is 77 year old white female appearing stated age, no acute distress. HEENT:  Moist mucous membranes.  Pupils equal, round, reactive to light accommodation.  Anicteric sclerae.  She has bruised and swollen nose with associated ecchymoses. CARDIOVASCULAR:  Irregular rate and rhythm.  No murmurs, rubs or gallops. LUNGS:  Clear to auscultation bilaterally. ABDOMEN:  Nontender, nondistended.  Positive bowel sounds.  No mass. EXTREMITIES:  No clubbing, cyanosis, edema. NEUROLOGIC:  Alert and oriented x3.  Cranial nerves II through XII grossly intact.  No focal neurologic deficits. PSYCH:  Mood and affect are appropriate. SKIN:  Warm, dry and intact.  No rash except for bruising on her nose.  RADIOLOGY:  CT of her head showed nasal fracture and no intracranial process.  Chest x-ray showed no acute cardiopulmonary disease.  EKG showed normal sinus rhythm with a rate of 89 beats per minute with inferolateral ST depressions, 0.5 mm.  LABORATORY REVIEW:  White count 19.3, hematocrit 41, platelets 165. Potassium is 3.3, creatinine  is 1.1.  Troponin was 1.1 initially and then rose to 1.6.  ASSESSMENT/PLAN:  This is a 77 year old with syncope of unclear etiology found to have increased troponin: 1. Non-ST elevation myocardial infarction.  She does not have any     chest pain, though she has subtle ischemic changes on her EKG.  She     currently refuses any invasive procedures such as a heart cath.  We     will proceed conservative management at this point and I have     started her on heparin drip, aspirin and cycle cardiac enzymes.  We     will also additionally check a 2-D echocardiogram in the morning. 2. Syncope.  This may be a mechanical fall secondary to lower     extremity weakness.  However, it is unclear and I do not  suspect     malignant arrhythmia.  We will check a 2-D echocardiogram for     structural heart disease and monitor on telemetry. 3. Increased white blood cell count.  This may be due to stress     response and we will monitor this though I will consider hematology     consult if it remains elevated.     Wendi Snipes, MD     BHH/MEDQ  D:  07/25/2010  T:  07/25/2010  Job:  [id number]  Electronically Signed by Jim Desanctis MD on 09/07/2010 12:52:04 PM

## 2010-11-16 NOTE — Op Note (Signed)
Cathy Wallace, Cathy Wallace                       ACCOUNT NO.:  [id number]   MEDICAL RECORD NO.:  [id number]                   PATIENT TYPE:  AMB   LOCATION:  DSC                                  FACILITY:  [facility]   PHYSICIAN:  Rose Phi. Maple Hudson, M.D.                DATE OF BIRTH:  May 07, 1917   DATE OF PROCEDURE:  04/06/2003  DATE OF DISCHARGE:                                 OPERATIVE REPORT   PREOPERATIVE DIAGNOSIS:  Stage I carcinoma of the left breast.   POSTOPERATIVE DIAGNOSIS:  Stage I carcinoma of the left breast.   OPERATION:  1. Blue dye injection.  2. Left partial mastectomy with needle localization and specimen     mammography.  3. Left sentinel lymph node biopsy.   SURGEON:  Rose Phi. Maple Hudson, M.D.   ANESTHESIA:  General.   DESCRIPTION OF PROCEDURE:  Prior to coming to the operating room a mc of  technetium sulfur colloid was injected intradermally.  Also a localizing  wire had been placed for this nonpalpable tumor at the 6 o'clock position of  her left breast.  After suitable general anesthesia was induced, the patient  was placed in the supine position with the arms extended on the arm board,  and 5 mL of a mixture of 2 mL of methylene blue and 3 mL of injectable  saline was then injected in the subareolar tissue, and the breast gently  massaged for about three minutes.  After prepping and draping in the standard fashion, a curved incision in the  inferior portion of the left breast, using the previously-placed wire as a  guide, was then made.  Then an excision of the wire and surrounding tissue,  including the malignant nodule was then carried out.  Specimen oriented for  the pathologist.  The specimen mammography confirmed the removal of the  lesion.  While that was being done, a short transverse left axillary incision was  then made with dissection through the subcutaneous tissue to the  clavipectoral fascia.  Just deep to the fascia was the blue lymphatics,  going  into a blue and hot lymph node, and then adjacent to it was a hot  lymph node that was not blue.  These were removed as sentinel nodes.  There  were no palpable nodes, and no other blue or hot nodes.  Touch preps of the sentinel nodes were negative for metastatic disease, and  touch prep of the margins were clean.  The incisions were closed with #3-0 Vicryl and subcuticular #4-0 Monocryl  and Steri-Strips.  Dressings were applied.  The patient was transferred to the recovery room in satisfactory condition,  having tolerated the procedure well.  Rose Phi. Maple Hudson, M.D.    PRY/MEDQ  D:  04/06/2003  T:  04/06/2003  Job:  [id number]   cc:   Tasia Catchings, M.D.  [address]  [place]  [place] [address]  Fax: [phone]

## 2011-07-02 DIAGNOSIS — F419 Anxiety disorder, unspecified: Secondary | ICD-10-CM

## 2011-07-02 HISTORY — DX: Anxiety disorder, unspecified: F41.9

## 2011-11-21 ENCOUNTER — Other Ambulatory Visit: Payer: Self-pay | Admitting: Dermatology

## 2012-01-26 IMAGING — US US EXTREM LOW VENOUS*R*
1 series · 14 of 24 positions shown · non-contrast
Comparison: None.

CLINICAL DATA: Leg pain and swelling.

RIGHT LOWER EXTREMITY VENOUS DUPLEX ULTRASOUND
TECHNIQUE: Gray-scale sonography with graded compression, as well
as color Doppler and duplex ultrasound, were performed to evaluate
the deep venous system of the lower extremity from the level of the
common femoral vein through the popliteal and proximal calf veins.
Spectral Doppler was utilized to evaluate flow at rest and with
distal augmentation maneuvers.

[Series 1: us extrem low venous*right* · 14 of 36 slices shown]
[im 1/36]
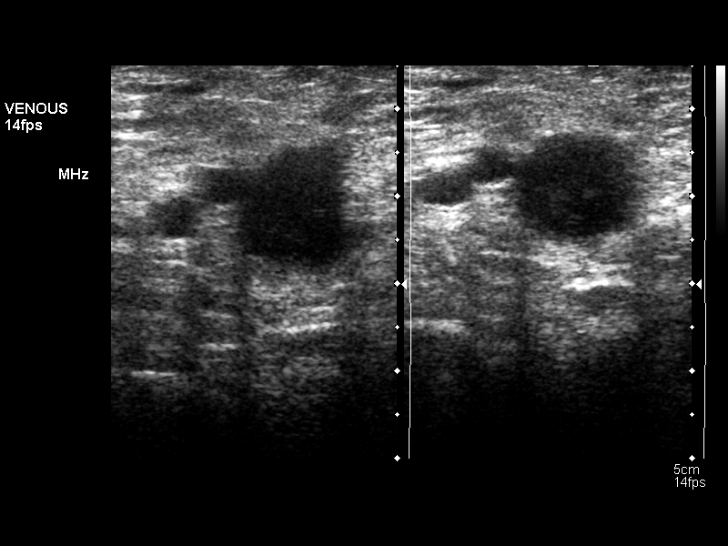
[im 4/36]
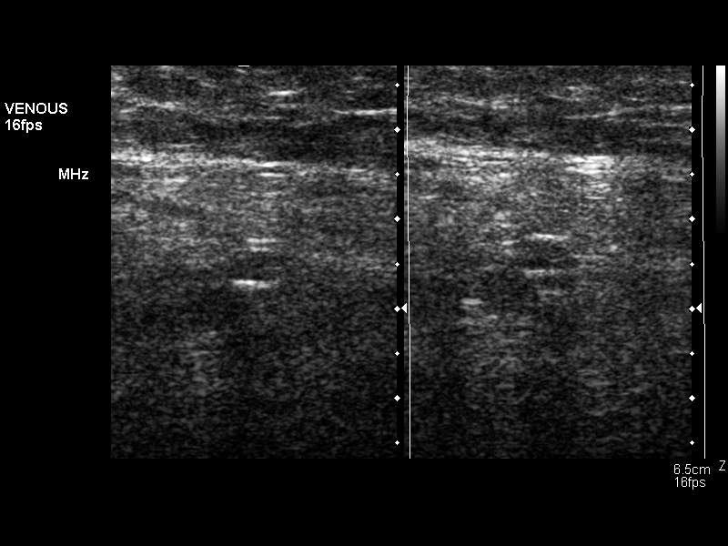
[im 7/36]
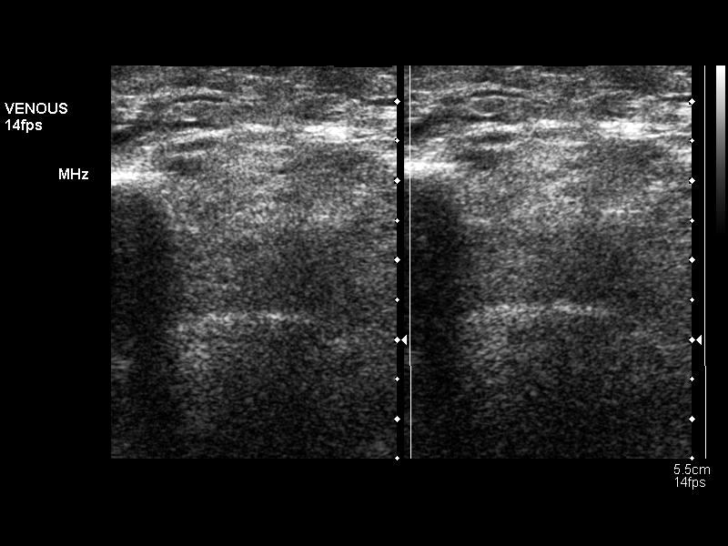
[im 10/36]
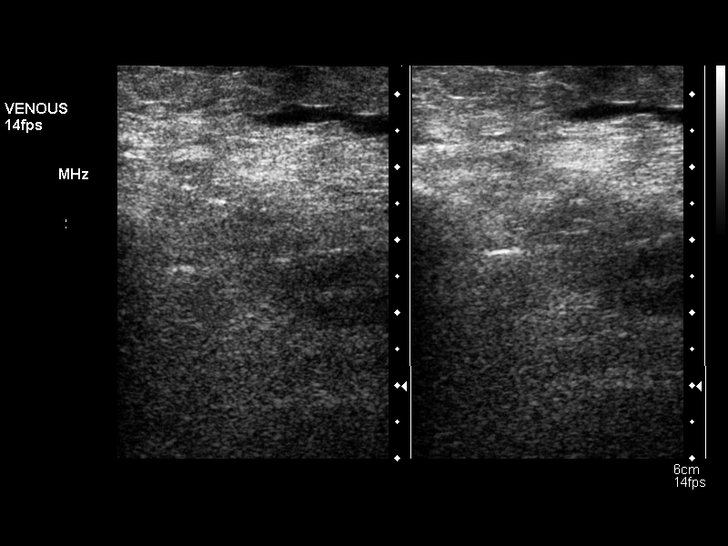
[im 11/36]
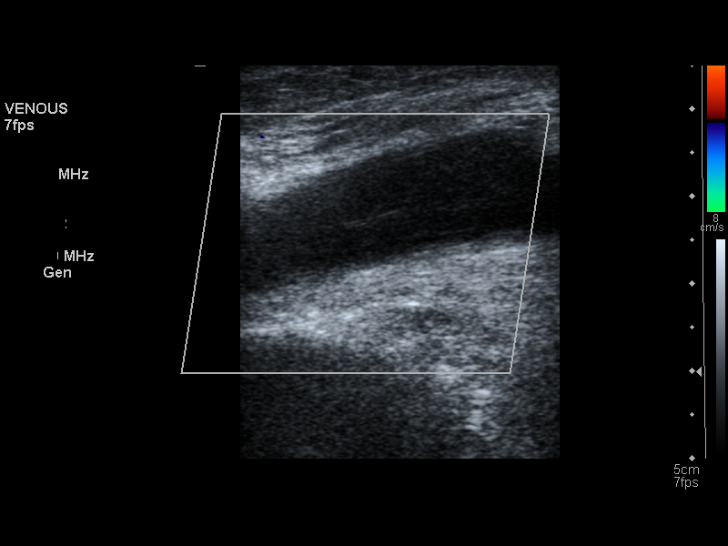
[im 14/36]
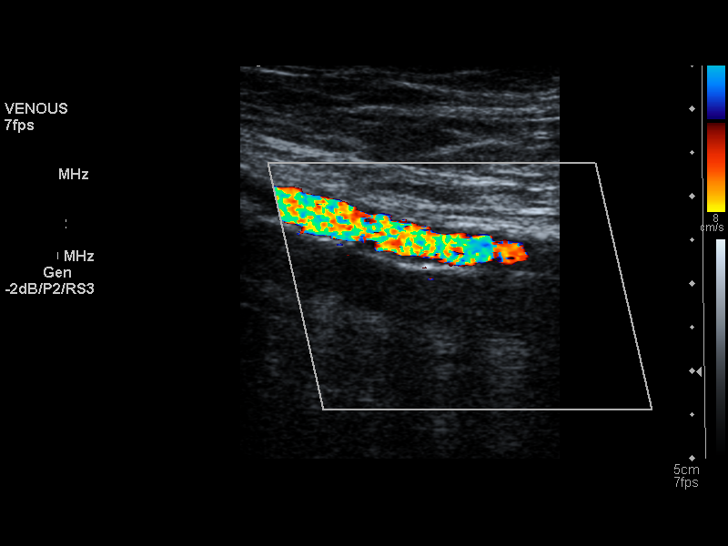
[im 17/36]
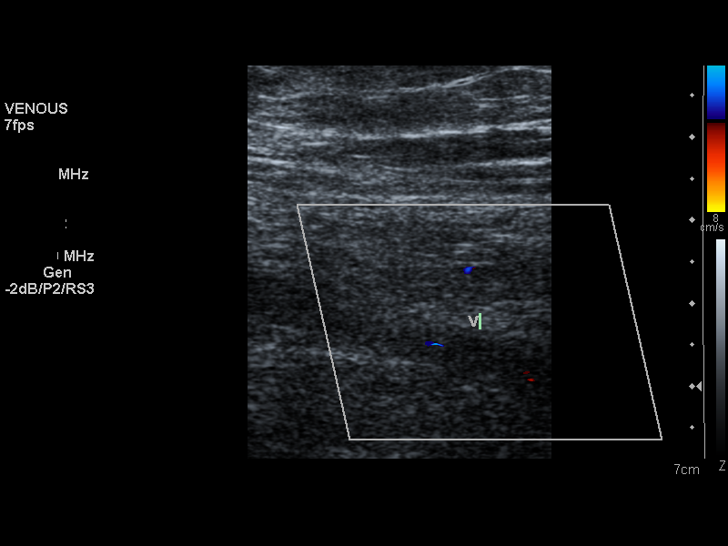
[im 19/36]
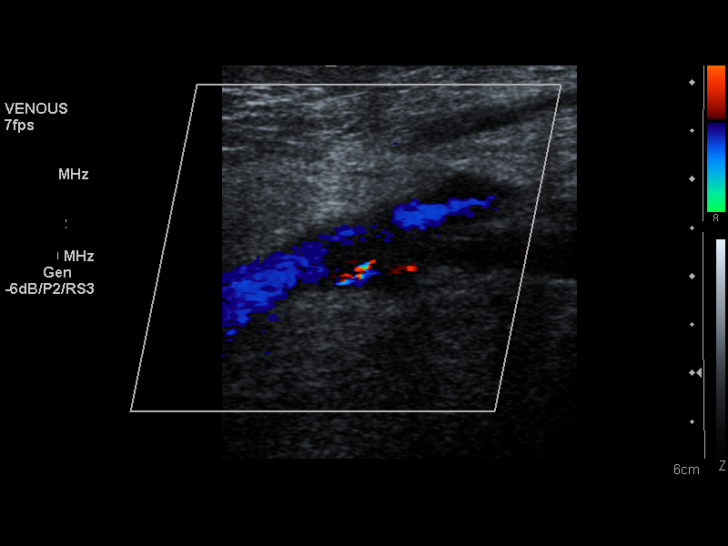
[im 22/36]
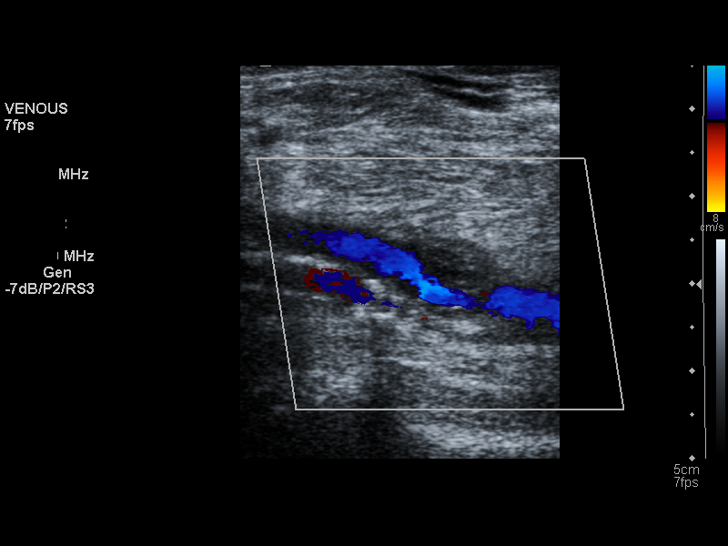
[im 25/36]
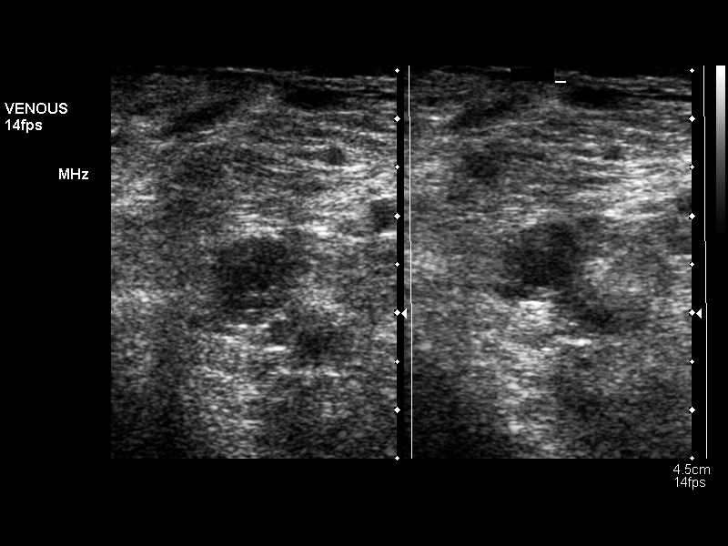
[im 28/36]
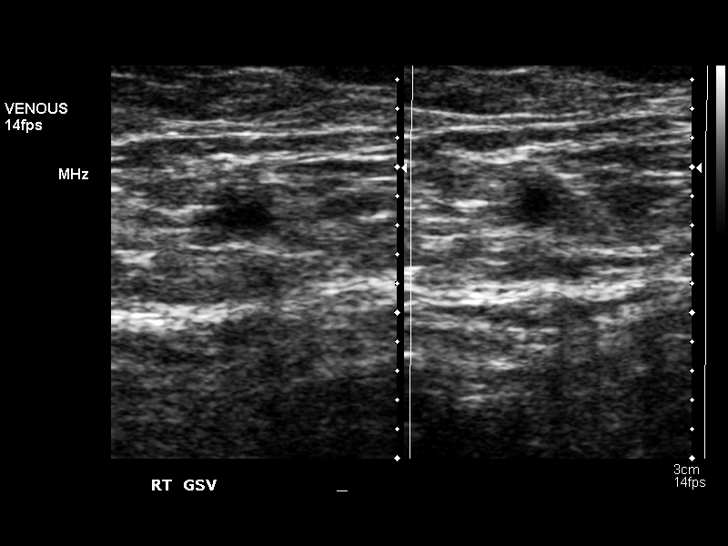
[im 29/36]
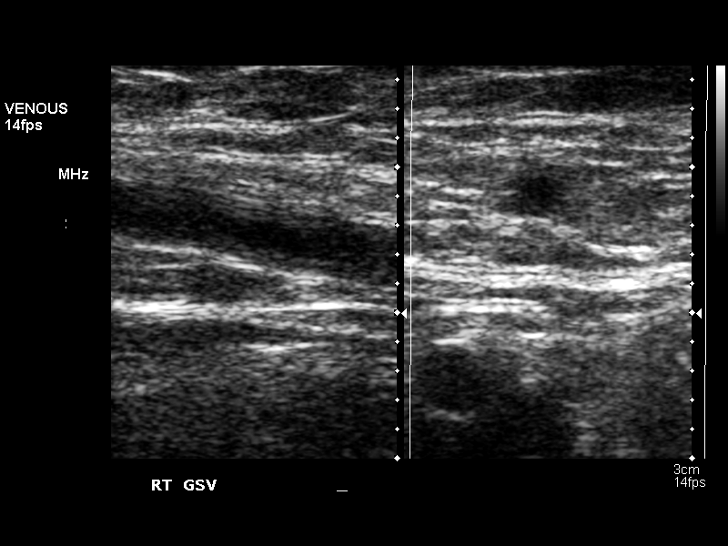
[im 32/36]
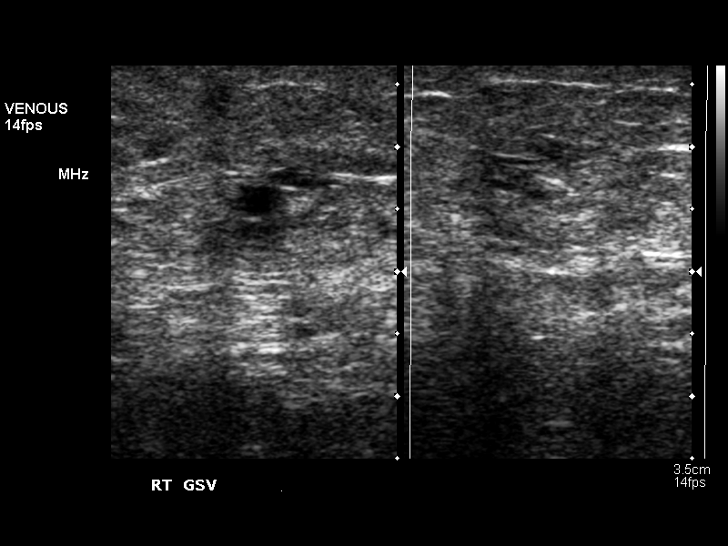
[im 36/36]
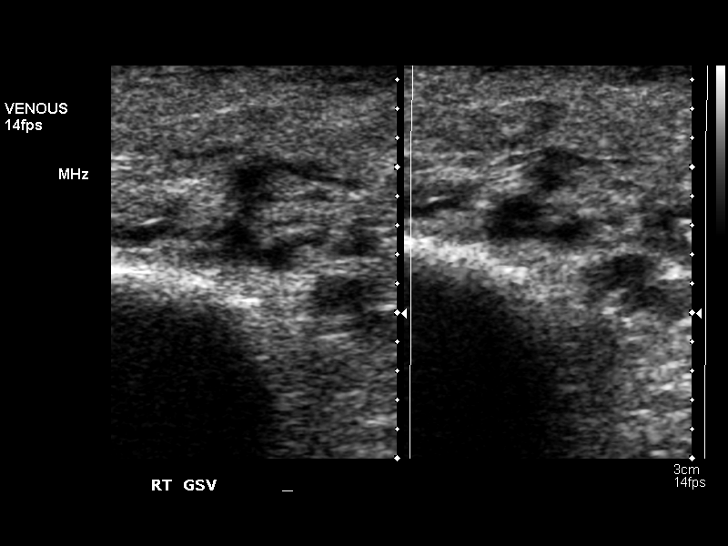

[14 of 24 positions shown; findings below may reference images not displayed]

FINDINGS: There is lack of compressibility in the right common
femoral, profunda femoral, and femoral veins.  Decreased
compressibility is seen in the right popliteal vein.  Greater
saphenous vein is noncompressible at the saphenofemoral junction,
but compresses distally.  Posterior tibial veins are poorly
visualized.
IMPRESSION: Right lower extremity deep venous thrombosis from the
saphenofemoral junction to the popliteal vein. Posterior tibial
veins are poorly visualized.  This report was called [REDACTED], in
Dr. [REDACTED], at [DATE].

## 2012-07-01 DIAGNOSIS — S5291XA Unspecified fracture of right forearm, initial encounter for closed fracture: Secondary | ICD-10-CM

## 2012-07-01 DIAGNOSIS — I209 Angina pectoris, unspecified: Secondary | ICD-10-CM

## 2012-07-01 DIAGNOSIS — M21371 Foot drop, right foot: Secondary | ICD-10-CM

## 2012-07-01 DIAGNOSIS — E785 Hyperlipidemia, unspecified: Secondary | ICD-10-CM

## 2012-07-01 DIAGNOSIS — Z9181 History of falling: Secondary | ICD-10-CM

## 2012-07-01 DIAGNOSIS — R2681 Unsteadiness on feet: Secondary | ICD-10-CM

## 2012-07-01 DIAGNOSIS — I2510 Atherosclerotic heart disease of native coronary artery without angina pectoris: Secondary | ICD-10-CM

## 2012-07-01 HISTORY — DX: Foot drop, right foot: M21.371

## 2012-07-01 HISTORY — DX: Unspecified fracture of right forearm, initial encounter for closed fracture: S52.91XA

## 2012-07-01 HISTORY — DX: Angina pectoris, unspecified: I20.9

## 2012-07-01 HISTORY — DX: History of falling: Z91.81

## 2012-07-01 HISTORY — DX: Atherosclerotic heart disease of native coronary artery without angina pectoris: I25.10

## 2012-07-01 HISTORY — DX: Unsteadiness on feet: R26.81

## 2012-07-01 HISTORY — DX: Hyperlipidemia, unspecified: E78.5

## 2012-08-08 ENCOUNTER — Encounter (HOSPITAL_COMMUNITY)
Admission: EM | Disposition: A | Discharge status: Skilled Nursing Facility | Payer: Self-pay | Source: Home / Self Care | Attending: Cardiovascular Disease

## 2012-08-08 ENCOUNTER — Other Ambulatory Visit: Payer: Self-pay

## 2012-08-08 ENCOUNTER — Ambulatory Visit (HOSPITAL_COMMUNITY): Admit: 2012-08-08 | Payer: Self-pay | Admitting: Cardiovascular Disease

## 2012-08-08 ENCOUNTER — Inpatient Hospital Stay (HOSPITAL_COMMUNITY)
Admission: EM | Admit: 2012-08-08 | Discharge: 2012-08-12 | DRG: 249 | Disposition: A | Discharge status: Skilled Nursing Facility | Payer: Medicare Other | Attending: Cardiovascular Disease | Admitting: Cardiovascular Disease

## 2012-08-08 ENCOUNTER — Encounter (HOSPITAL_COMMUNITY): Payer: Self-pay | Admitting: Cardiology

## 2012-08-08 DIAGNOSIS — I214 Non-ST elevation (NSTEMI) myocardial infarction: Principal | ICD-10-CM | POA: Diagnosis present

## 2012-08-08 DIAGNOSIS — I2782 Chronic pulmonary embolism: Secondary | ICD-10-CM | POA: Diagnosis present

## 2012-08-08 DIAGNOSIS — Z86718 Personal history of other venous thrombosis and embolism: Secondary | ICD-10-CM

## 2012-08-08 DIAGNOSIS — I2699 Other pulmonary embolism without acute cor pulmonale: Secondary | ICD-10-CM | POA: Diagnosis present

## 2012-08-08 DIAGNOSIS — N289 Disorder of kidney and ureter, unspecified: Secondary | ICD-10-CM | POA: Diagnosis present

## 2012-08-08 DIAGNOSIS — I213 ST elevation (STEMI) myocardial infarction of unspecified site: Secondary | ICD-10-CM | POA: Diagnosis present

## 2012-08-08 DIAGNOSIS — I498 Other specified cardiac arrhythmias: Secondary | ICD-10-CM | POA: Diagnosis present

## 2012-08-08 DIAGNOSIS — I82409 Acute embolism and thrombosis of unspecified deep veins of unspecified lower extremity: Secondary | ICD-10-CM | POA: Diagnosis present

## 2012-08-08 DIAGNOSIS — M4800 Spinal stenosis, site unspecified: Secondary | ICD-10-CM | POA: Diagnosis present

## 2012-08-08 DIAGNOSIS — I10 Essential (primary) hypertension: Secondary | ICD-10-CM | POA: Diagnosis present

## 2012-08-08 DIAGNOSIS — I442 Atrioventricular block, complete: Secondary | ICD-10-CM | POA: Diagnosis present

## 2012-08-08 DIAGNOSIS — R001 Bradycardia, unspecified: Secondary | ICD-10-CM | POA: Diagnosis present

## 2012-08-08 DIAGNOSIS — I2510 Atherosclerotic heart disease of native coronary artery without angina pectoris: Secondary | ICD-10-CM | POA: Diagnosis present

## 2012-08-08 DIAGNOSIS — Z7901 Long term (current) use of anticoagulants: Secondary | ICD-10-CM

## 2012-08-08 HISTORY — DX: Bradycardia, unspecified: R00.1

## 2012-08-08 HISTORY — DX: Other pulmonary embolism without acute cor pulmonale: I26.99

## 2012-08-08 HISTORY — DX: Essential (primary) hypertension: I10

## 2012-08-08 HISTORY — PX: PERCUTANEOUS CORONARY STENT INTERVENTION (PCI-S): SHX5485

## 2012-08-08 HISTORY — PX: CARDIAC CATHETERIZATION: SHX172

## 2012-08-08 HISTORY — DX: Spinal stenosis, site unspecified: M48.00

## 2012-08-08 HISTORY — PX: LEFT HEART CATHETERIZATION WITH CORONARY ANGIOGRAM: SHX5451

## 2012-08-08 HISTORY — DX: Acute embolism and thrombosis of unspecified deep veins of unspecified lower extremity: I82.409

## 2012-08-08 LAB — CBC
Hemoglobin: 12 g/dL (ref 12.0–15.0)
MCH: 30.4 pg (ref 26.0–34.0)
MCHC: 33.7 g/dL (ref 30.0–36.0)
Platelets: 277 10*3/uL (ref 150–400)
RBC: 3.95 MIL/uL (ref 3.87–5.11)

## 2012-08-08 LAB — POCT I-STAT, CHEM 8
BUN: 28 mg/dL — ABNORMAL HIGH (ref 6–23)
Chloride: 105 mEq/L (ref 96–112)
Creatinine, Ser: 1.8 mg/dL — ABNORMAL HIGH (ref 0.50–1.10)
Glucose, Bld: 169 mg/dL — ABNORMAL HIGH (ref 70–99)
Hemoglobin: 12.6 g/dL (ref 12.0–15.0)
Potassium: 4 mEq/L (ref 3.5–5.1)
Sodium: 138 mEq/L (ref 135–145)

## 2012-08-08 LAB — COMPREHENSIVE METABOLIC PANEL
ALT: 23 U/L (ref 0–35)
AST: 57 U/L — ABNORMAL HIGH (ref 0–37)
Alkaline Phosphatase: 91 U/L (ref 39–117)
Calcium: 10 mg/dL (ref 8.4–10.5)
Potassium: 4 mEq/L (ref 3.5–5.1)
Sodium: 137 mEq/L (ref 135–145)
Total Protein: 6.2 g/dL (ref 6.0–8.3)

## 2012-08-08 LAB — APTT: aPTT: 30 seconds (ref 24–37)

## 2012-08-08 LAB — POCT I-STAT TROPONIN I

## 2012-08-08 SURGERY — LEFT HEART CATHETERIZATION WITH CORONARY ANGIOGRAM
Anesthesia: LOCAL

## 2012-08-08 MED ORDER — FENTANYL CITRATE 0.05 MG/ML IJ SOLN
INTRAMUSCULAR | Status: AC
  Filled 2012-08-08: qty 2

## 2012-08-08 MED ORDER — ASPIRIN EC 325 MG PO TBEC
325.0000 mg | DELAYED_RELEASE_TABLET | Freq: Every day | ORAL | Status: DC
  Filled 2012-08-08: qty 1

## 2012-08-08 MED ORDER — LIDOCAINE HCL (PF) 1 % IJ SOLN
INTRAMUSCULAR | Status: AC
  Filled 2012-08-08: qty 30

## 2012-08-08 MED ORDER — MIDAZOLAM HCL 2 MG/2ML IJ SOLN
INTRAMUSCULAR | Status: AC
  Filled 2012-08-08: qty 2

## 2012-08-08 MED ORDER — ONDANSETRON HCL 4 MG/2ML IJ SOLN: 4.0000 mg | Freq: Four times a day (QID) | INTRAMUSCULAR | Status: DC | PRN

## 2012-08-08 MED ORDER — ASPIRIN 81 MG PO CHEW: 324.0000 mg | CHEWABLE_TABLET | Freq: Once | ORAL | Status: DC

## 2012-08-08 MED ORDER — HEPARIN SODIUM (PORCINE) 5000 UNIT/ML IJ SOLN
60.0000 [IU]/kg | INTRAMUSCULAR | Status: AC
  Administered 2012-08-08: 3400 [IU] via INTRAVENOUS
  Filled 2012-08-08: qty 1

## 2012-08-08 MED ORDER — SODIUM CHLORIDE 0.9 % IV SOLN
INTRAVENOUS | Status: DC
  Administered 2012-08-09 – 2012-08-11 (×5): via INTRAVENOUS

## 2012-08-08 MED ORDER — BIVALIRUDIN 250 MG IV SOLR
INTRAVENOUS | Status: AC
  Filled 2012-08-08: qty 250

## 2012-08-08 MED ORDER — ATROPINE SULFATE 1 MG/ML IJ SOLN
INTRAMUSCULAR | Status: AC
  Filled 2012-08-08: qty 1

## 2012-08-08 MED ORDER — ACETAMINOPHEN 325 MG PO TABS: 650.0000 mg | ORAL_TABLET | ORAL | Status: DC | PRN

## 2012-08-08 MED ORDER — CLOPIDOGREL BISULFATE 75 MG PO TABS
ORAL_TABLET | ORAL | Status: AC
  Filled 2012-08-08: qty 2

## 2012-08-08 MED ORDER — HEPARIN (PORCINE) IN NACL 2-0.9 UNIT/ML-% IJ SOLN
INTRAMUSCULAR | Status: AC
  Filled 2012-08-08: qty 1000

## 2012-08-08 MED ORDER — ASPIRIN EC 81 MG PO TBEC: 81.0000 mg | DELAYED_RELEASE_TABLET | Freq: Every day | ORAL | Status: DC

## 2012-08-08 MED ORDER — ONDANSETRON HCL 4 MG/2ML IJ SOLN
INTRAMUSCULAR | Status: AC
  Filled 2012-08-08: qty 2

## 2012-08-08 MED ORDER — CLOPIDOGREL BISULFATE 75 MG PO TABS
75.0000 mg | ORAL_TABLET | Freq: Every day | ORAL | Status: DC
  Administered 2012-08-09 – 2012-08-12 (×4): 75 mg via ORAL
  Filled 2012-08-08 (×6): qty 1

## 2012-08-08 MED ORDER — SODIUM CHLORIDE 0.9 % IV SOLN: 0.2500 mg/kg/h | INTRAVENOUS | Status: AC

## 2012-08-08 MED ORDER — SODIUM CHLORIDE 0.9 % IV SOLN
INTRAVENOUS | Status: DC
  Administered 2012-08-08: 1000 mL via INTRAVENOUS

## 2012-08-08 MED ORDER — NITROGLYCERIN 0.4 MG SL SUBL: 0.4000 mg | SUBLINGUAL_TABLET | SUBLINGUAL | Status: DC | PRN

## 2012-08-08 MED ORDER — NITROGLYCERIN 1 MG/10 ML FOR IR/CATH LAB
INTRA_ARTERIAL | Status: AC
  Filled 2012-08-08: qty 10

## 2012-08-08 MED ORDER — SIMVASTATIN 20 MG PO TABS
20.0000 mg | ORAL_TABLET | Freq: Every day | ORAL | Status: DC
  Administered 2012-08-09 – 2012-08-11 (×3): 20 mg via ORAL
  Filled 2012-08-08 (×4): qty 1

## 2012-08-08 MED ORDER — CLOPIDOGREL BISULFATE 75 MG PO TABS
ORAL_TABLET | ORAL | Status: AC
  Filled 2012-08-08: qty 6

## 2012-08-08 MED ORDER — NITROGLYCERIN IN D5W 200-5 MCG/ML-% IV SOLN
2.0000 ug/min | INTRAVENOUS | Status: DC
Start: 2012-08-08 — End: 2012-08-12

## 2012-08-08 MED ORDER — DOPAMINE-DEXTROSE 3.2-5 MG/ML-% IV SOLN
INTRAVENOUS | Status: AC
  Filled 2012-08-08: qty 250

## 2012-08-08 NOTE — ED Notes (Signed)
Lab at bedside

## 2012-08-08 NOTE — ED Notes (Signed)
Pt to Cath lab report given to Cath lab staff

## 2012-08-08 NOTE — ED Notes (Signed)
cardfiologist at bedside

## 2012-08-08 NOTE — Cardiovascular Report (Signed)
[name], [name] NO.:  [id number]  MEDICAL RECORD NO.:  [id number]  LOCATION:  2913                         FACILITY:  [facility]  PHYSICIAN:  Nicki Guadalajara, M.D.     DATE OF BIRTH:  Apr 03, 1917  DATE OF PROCEDURE: DATE OF DISCHARGE:                           CARDIAC CATHETERIZATION   PROCEDURE:  Acute ST-segment elevation inferior wall myocardial infarction, with marked bradycardia and heart block.  INDICATIONS:  Ms. Cathy Wallace is a 79 year old, fairly active independent woman who lives in independent living at [facility]. There is remote history of DVT as well as PE and patient had been on Coumadin therapy at some point.  Apparently at 10 o'clock this morning, she developed several hours of chest tightness.  Ultimately, her chest pain did somewhat improve.  It later returned and she was associated with significant nausea.  This evening, she presented to [facility] Emergency Room where ECG in the ER showed an inferior ST-segment elevation myocardial infarction with 1-2 mm ST-elevation in leads 2, 3 and F with possible 2:1 block.  Options were discussed with the patient and she wished aggressive intervention with cardiac catheterization, and was brought to the catheterization laboratory for acute catheterization.  DESCRIPTION OF PROCEDURE:  The patient was prepped and draped in usual fashion.  In the emergency room, she had received 3500 units of heparin. ACT was 196.  Her INR came back 1.16.  Right femoral artery was punctured anteriorly and a 6-French sheath was inserted.  Initially, a 6- [place] left catheter was inserted and selective angiography of left coronary artery was performed.  A right bypass guide 6-French was then inserted which showed total occlusion of the proximal RCA with TIMI 0 flow.  There was poor backup.  The patient was given Angiomax bolus plus infusion.  Asahi medium wire was able was advanced into the RCA. Initial 2.0 x12 Sprinter  balloon was used for initial balloon inflation at the site of total occlusion.  The patient did receive atropine 0.5 mg x2.  A temporary pacemaker was inserted via the right femoral vein and advanced to the RV apex.  With balloon support, the wire was able to be passed down to the distal RCA.  Multiple dilatations were done with a 2- 0 Sprinter balloon, but there was significant thrombus burden.  The balloon was then removed and an Expressway thrombectomy catheter was inserted.  Five runs were made into the RCA with significant clot retrieval.  With the patient's age of 34, she was not felt to be an Effient candidate and with her age, the decision was made to give Plavix 600 mg for antiplatelet therapy.  ACT was documented to be therapeutic with Angiomax.  A 2.5 x 15 mm Emerge balloon was then used for improved dilatation.  Since there was segmental disease beyond the initial occlusion, initial inflation was made in the mid RCA.  A 2.75 x 32 mm VeriFLEX bare-metal stent was then inserted to cover the proximal to mid segment, and all lesions.  This was dilated x2 up to 11 atmospheres. Noncompliant Sprinter balloon, 2.75 x 21 mm was used for post stent dilatation up to 2.80 mm.  Scout angiography confirmed an excellent angiographic  result with brisk TIMI-3 flow and no evidence for dissection.  Of note, the patient also at the start of the procedure, was started on dopamine.  Once the patient had brisk flow and had improved blood pressure, the dopamine was ultimately weaned and discontinued.  The right system was then removed and a pigtail catheter was inserted for LV pressure recording.  Left ventriculography was not performed due to the patient's renal insufficiency in this 79 year old female.  LVEDP was low at 5.6, suggesting the patient was drying, her fluids were increased.  Once the vessel was opened.  The patient no longer needed her temporary pacemaker and ultimately this was  removed. Thus, the arterial and venous sheaths were sutured in place.  The patient will be maintained on Angiomax infusion for 4 hours following the procedure, allowing time for the anti-platelet therapy to take affect.  She left the catheterization laboratory pain-free in stable condition.  During the early portion of the procedure, however with reperfusion, she did become nauseated and did require 2 mg of Zofran.  HEMODYNAMIC DATA:  Central aortic pressure 120/60.  Left ventricle pressure 120/5.  ANGIOGRAPHIC DATA:  Left main coronary artery was angiographically normal and bifurcated into an LAD and left circumflex system.  The LAD was moderate-sized vessel that was free of significant disease. However, there did appear to be 30-50% narrowing and a prominent septal proximal septal perforating artery.  The LAD gave rise to three diagonal vessels.  The circumflex vessel was angiographically normal and gave rise to 2 marginal vessels.  The right coronary artery had upward takeoff and then was totally occluded with proximal segment in the region of a shepherd crook configuration.  There was TIMI 0 flow.  Once the right coronary artery was able to be opened, there was significant thrombus burden, which significantly improved following 5 passes of thrombectomy.  There was luminal irregularity of approximately 60-70% in the proximal to mid segment.  Following ultimate stenting with a 2.75 x 32 mm bare-metal VeriFLEX stent post dilated to 2.8 mm the proximal to mid region was covered.  The entire region was reduced to 0%.  There was brisk TIMI-3 flow.  There was no evidence for dissection.  IMPRESSION: 1. Acute ST-segment elevation myocardial infarction secondary to total     proximal RCA occlusion, complicated by bradycardia with possible     2:1 AV block. 2. Normal left main.  Circumflex and LAD system with insignificant 30%     narrowing in the septal perforating artery. 3.  Temporary pacemaker for marked bradycardia with heart rates into     the 30s and heart block. 4. Successful percutaneous coronary intervention of the right coronary     artery with PTCA, thrombectomy, ultimate stenting with a 2.75 x 32     mm VeriFLEX bare-metal stent post dilated 2.8 mm. 5. 600 mg Plavix, Angiomax, bolus plus infusion, transient dopamine     and IC nitroglycerin, as well as Zofran during the procedure.  The patient arrived to [facility] Emergency Room at 1923, to the cardiac catheterization laboratory at 20.05 and time to first balloon inflation was 20:30, giving a daughter balloon time of 67 minutes from presentation to [facility] ER and 25 minutes from presentation to the catheterization laboratory.          ______________________________ Nicki Guadalajara, M.D.     TK/MEDQ  D:  08/08/2012  T:  08/08/2012  Job:  [id number]

## 2012-08-08 NOTE — H&P (Signed)
Patient ID: Cathy Wallace MRN: [id number], DOB/AGE: 09/24/1916   Admit date: 08/08/2012   Primary Physician: Pearla Dubonnet, MD Primary Cardiologist: Dr Tresa Endo (new)  HPI:    79 y/o active female who lives at [facility], admitted this evening with complaints of weakness. She apparently could not get up off the floor. She reportedly had 3 hours of chest pain around 10am today.  Her EKG in the ER showed sinus bradycardia and inferior ST elevation. After discussion with the pt and family it was felt that despite the pt's advanced age she was active and otherwise healthy and it would be appropriate to take her to the cath lab. Her past medical history is significant for DVT in 2011. She was Rx'd with Warfarin for 9 mos. Three months after Warfarin was stopped she was admitted for a "NSTEMI" with syncope. She was worked up by Dr Eldridge Dace and found to have bilateral pulmonary embolism. She reportedly is on Warfarin now but her INR on admission was 1.16. She is a DNR.   Problem List: Past Medical History  Diagnosis Date  . DVT (deep venous thrombosis) 2011  . Pulmonary embolism Jan 2012  . HTN (hypertension)   . Spinal stenosis     No past surgical history on file.   Allergies:  Allergies  Allergen Reactions  . Codeine   . Penicillins      Home Medications Warfarin Diltiazem 240 Omeprazole 20mg  Maxzide 37.5/25 Sertraline 25mg  Gabapentin 300mg  TID Fioricet prn     No family history on file.   History   Social History  . Marital Status: Married    Spouse Name: N/A    Number of Children: N/A  . Years of Education: N/A   Occupational History  . Not on file.   Social History Main Topics  . Smoking status: Never Smoker   . Smokeless tobacco: Not on file  . Alcohol Use: Not on file  . Drug Use: Not on file  . Sexually Active: Not on file   Other Topics Concern  . Not on file   Social History Narrative  . No narrative on file     Review of  Systems: Unobtainable as pt is currently in cath lab   Physical Exam: Blood pressure 98/79, pulse 41, temperature 97.4 F (36.3 C), temperature source Oral, resp. rate 15, height 5\' 3"  (1.6 m), weight 56.7 kg (125 lb), SpO2 100.00%.  Per Dr Tresa Endo, pt in cath lab    Labs:   Results for orders placed during the hospital encounter of 08/08/12 (from the past 24 hour(s))  APTT     Status: None   Collection Time    08/08/12  7:36 PM      Result Value Range   aPTT 30  24 - 37 seconds  CBC     Status: Abnormal   Collection Time    08/08/12  7:36 PM      Result Value Range   WBC 10.9 (*) 4.0 - 10.5 K/uL   RBC 3.95  3.87 - 5.11 MIL/uL   Hemoglobin 12.0  12.0 - 15.0 g/dL   HCT 01.0 (*) 27.2 - 53.6 %   MCV 90.1  78.0 - 100.0 fL   MCH 30.4  26.0 - 34.0 pg   MCHC 33.7  30.0 - 36.0 g/dL   RDW 64.4 (*) 03.4 - 74.2 %   Platelets 277  150 - 400 K/uL  COMPREHENSIVE METABOLIC PANEL     Status: Abnormal   Collection  Time    08/08/12  7:36 PM      Result Value Range   Sodium 137  135 - 145 mEq/L   Potassium 4.0  3.5 - 5.1 mEq/L   Chloride 100  96 - 112 mEq/L   CO2 21  19 - 32 mEq/L   Glucose, Bld 172 (*) 70 - 99 mg/dL   BUN 27 (*) 6 - 23 mg/dL   Creatinine, Ser 4.09 (*) 0.50 - 1.10 mg/dL   Calcium 81.1  8.4 - 91.4 mg/dL   Total Protein 6.2  6.0 - 8.3 g/dL   Albumin 3.2 (*) 3.5 - 5.2 g/dL   AST 57 (*) 0 - 37 U/L   ALT 23  0 - 35 U/L   Alkaline Phosphatase 91  39 - 117 U/L   Total Bilirubin 0.3  0.3 - 1.2 mg/dL   GFR calc non Af Amer 26 (*) >90 mL/min   GFR calc Af Amer 31 (*) >90 mL/min  PROTIME-INR     Status: None   Collection Time    08/08/12  7:36 PM      Result Value Range   Prothrombin Time 14.6  11.6 - 15.2 seconds   INR 1.16  0.00 - 1.49  POCT I-STAT TROPONIN I     Status: Abnormal   Collection Time    08/08/12  8:01 PM      Result Value Range   Troponin i, poc 3.89 (*) 0.00 - 0.08 ng/mL   Comment NOTIFIED PHYSICIAN     Comment 3           POCT I-STAT, CHEM 8      Status: Abnormal   Collection Time    08/08/12  8:03 PM      Result Value Range   Sodium 138  135 - 145 mEq/L   Potassium 4.0  3.5 - 5.1 mEq/L   Chloride 105  96 - 112 mEq/L   BUN 28 (*) 6 - 23 mg/dL   Creatinine, Ser 7.82 (*) 0.50 - 1.10 mg/dL   Glucose, Bld 956 (*) 70 - 99 mg/dL   Calcium, Ion 2.13  0.86 - 1.30 mmol/L   TCO2 23  0 - 100 mmol/L   Hemoglobin 12.6  12.0 - 15.0 g/dL   HCT 57.8  46.9 - 62.9 %     Radiology/Studies: No results found.  EKG:NSR, SB, inferior ST elevation  ASSESSMENT AND PLAN:  Principal Problem:   STEMI (ST elevation myocardial infarction) Active Problems:   Pulmonary embolism, Jan 2012   DVT (deep venous thrombosis), 2011   Acute renal insufficiency   HTN (hypertension)   Spinal stenosis    Signed, Abelino Derrick, PA-C 08/08/2012, 9:08 PM   Patient seen and examined. Agree with assessment and plan. Very pleasant 79 yo who remains active in independent living who developed chest pain today commencing at 10:00 am. She later became nauseated and tonight presented to ER where  ECG suggested STEMI inferiorly. Pt wished to be treated aggressively with cardiac catheterization and is now taken to cath lab for acute cath.   Lennette Bihari, MD, [facility] 08/08/2012 9:55 PM

## 2012-08-08 NOTE — ED Notes (Signed)
MD at bedside. 

## 2012-08-08 NOTE — ED Notes (Signed)
Pt had chest pain this am that has now resolved. Pt c/o ongoing weakness today

## 2012-08-08 NOTE — CV Procedure (Signed)
Emergency Cardiac Catheterization/Temporary Pacemaker/Thrombectomy/PTCA/Stent to RCA   Cathy Wallace, 79 y.o., female  Full note dictated; see diagram in chart  DICTATION # [phone], [id number]  Ao: 120/60 LV: 120/5  LM: nl LAD: no significant stenosis, 30% septal stenosis LCX: nl RCA: total prox occlusion with [place] 0 flow  PCI to RCA utilizing Asahi medium wire, 2.0 x 12 Sprinter, Expressway thrombectomy 5 runs, 2.5 x 15 Emerge, 2.75 x 32 BM Veriflex stent, and 2.75 [place] Sprinter: 100% and prox to mid  60 - 70% stenosis to 0.  Temporary pacemaker inserted for heart block and P in 30's. Received atropine and dopamine transiently.  LV gram not done due to renal insufficiency.  Lennette Bihari, MD, [facility] 08/08/2012 10:31 PM

## 2012-08-08 NOTE — ED Provider Notes (Signed)
History  This chart was scribed for Cathy Racer, MD by Bennett Scrape, ED Scribe. This patient was seen in room A06C/A06C and the patient's care was started at 7:28 PM.  CSN: [id number]  Arrival date & time 08/08/12  Cathy Wallace   First MD Initiated Contact with Patient 08/08/12 1928      Chief Complaint  Patient presents with  . Weakness    Patient is a 79 y.o. female presenting with weakness. The history is provided by the patient. No language interpreter was used.  Weakness This is a new problem. The current episode started 1 to 2 hours ago. The problem occurs constantly. The problem has not changed since onset.Associated symptoms include chest pain (earlier today, not currently). Pertinent negatives include no abdominal pain. She has tried nothing for the symptoms.    Cathy Wallace is a 79 y.o. female brought in by ambulance from [facility], who presents to the Emergency Department complaining of weakness described as an inability to get up off the floor that she noticed about 1.5 hours ago. She also reports having 3 hours of central CP that radiated into her mid back and down her left arm that occurred around 10 AM today. She states that she took ASA and Tums with improvement in the CP. She denies having CP currently. She denies having prior episodes of similar symptoms. She reports that she is currently on coumadin. She states that she has a h/o GERD but states that this episode was more severe than prior GERD episodes. Pt states that at baseline she is able to care for herself. She denies abdominal pain, leg swelling, nausea and emesis as associated symptoms. She has a h/o GERD but denies having a h/o prior cardiac or lung problems.   No past medical history on file. GERD per pt  No past surgical history on file.  No family history on file.  History  Substance Use Topics  . Smoking status: Not on file  . Smokeless tobacco: Not on file  . Alcohol Use: Not  on file    OB History   No data available      Review of Systems  Constitutional: Negative for fever and chills.  Cardiovascular: Positive for chest pain (earlier today, not currently). Negative for leg swelling.  Gastrointestinal: Negative for nausea, vomiting, abdominal pain and diarrhea.  Neurological: Positive for weakness.  All other systems reviewed and are negative.    Allergies  Codeine and Penicillins  Home Medications  No current outpatient prescriptions on file.  Triage Vitals: BP 92/48  Pulse 40  Temp(Src) 97.4 F (36.3 C) (Oral)  Resp 20  SpO2 99%  Physical Exam  Nursing note and vitals reviewed. Constitutional: She is oriented to person, place, and time. She appears well-developed and well-nourished. No distress.  HENT:  Head: Normocephalic and atraumatic.  Eyes: Conjunctivae and EOM are normal. Pupils are equal, round, and reactive to light.  Neck: Neck supple. No tracheal deviation present.  Cardiovascular: Normal rate and regular rhythm.   HR is 75  Pulmonary/Chest: Effort normal and breath sounds normal. No respiratory distress.  Abdominal: Soft. There is no tenderness.  Musculoskeletal: Normal range of motion.  Neurological: She is alert and oriented to person, place, and time.  Skin: Skin is warm and dry.  Psychiatric: She has a normal mood and affect. Her behavior is normal.    ED Course  Procedures (including critical care time)  DIAGNOSTIC STUDIES: Oxygen Saturation is 99% on room air,  normal by my interpretation.    COORDINATION OF CARE: 7:44 PM-Consult complete with [name], Cardiologist. Patient case explained and discussed. [name] expresses some concern over taking the pt at age 81 to the cath lab. Will fax over EKGs for his review. Call ended at 7:46 PM.  7:49 PM-Discussed treatment plan which includes cardiac cath with pt at bedside and pt agreed to plan. Pt had 6 or 7 beats of V-tachy.  7:50 PM- Finished consult with Dr.  Tresa Endo who states that pt's EKG appears to show an inferior STEMI. Informed [name] of pt agreeing to cardiac cath. He agrees to admission for cardiac cath.  7:55 PM- Cardiology fellow is present at pt's bedside.  Labs Reviewed  APTT  CBC  COMPREHENSIVE METABOLIC PANEL  PROTIME-INR   No results found.   No diagnosis found.   Date: 08/08/2012  Rate: 40  Rhythm: sinus bradycardia  QRS Axis: normal  Intervals: PR prolonged  ST/T Wave abnormalities: ST elevations inferiorly  Conduction Disutrbances:first-degree A-V block   Narrative Interpretation:   Old EKG Reviewed: changes noted  CRITICAL CARE Performed by: Ranae Palms, Sybol Morre   Total critical care time: 20  Critical care time was exclusive of separately billable procedures and treating other patients.  Critical care was necessary to treat or prevent imminent or life-threatening deterioration.  Critical care was time spent personally by me on the following activities: development of treatment plan with patient and/or surrogate as well as nursing, discussions with consultants, evaluation of patient's response to treatment, examination of patient, obtaining history from patient or surrogate, ordering and performing treatments and interventions, ordering and review of laboratory studies, ordering and review of radiographic studies, pulse oximetry and re-evaluation of patient's condition.   MDM  I personally performed the services described in this documentation, which was scribed in my presence. The recorded information has been reviewed and is accurate.     Cathy Racer, MD 08/08/12 2011

## 2012-08-08 NOTE — ED Notes (Signed)
Awaiting Heparin from Pharmacy.

## 2012-08-08 NOTE — ED Notes (Signed)
Pt son to bedside.  Bracelet given to son.

## 2012-08-09 ENCOUNTER — Encounter (HOSPITAL_COMMUNITY): Payer: Self-pay

## 2012-08-09 ENCOUNTER — Inpatient Hospital Stay (HOSPITAL_COMMUNITY): Payer: Medicare Other

## 2012-08-09 DIAGNOSIS — I2510 Atherosclerotic heart disease of native coronary artery without angina pectoris: Secondary | ICD-10-CM | POA: Diagnosis present

## 2012-08-09 DIAGNOSIS — R001 Bradycardia, unspecified: Secondary | ICD-10-CM | POA: Diagnosis present

## 2012-08-09 DIAGNOSIS — I442 Atrioventricular block, complete: Secondary | ICD-10-CM | POA: Diagnosis present

## 2012-08-09 HISTORY — DX: Bradycardia, unspecified: R00.1

## 2012-08-09 LAB — CBC
Hemoglobin: 10.5 g/dL — ABNORMAL LOW (ref 12.0–15.0)
RBC: 3.57 MIL/uL — ABNORMAL LOW (ref 3.87–5.11)

## 2012-08-09 LAB — BASIC METABOLIC PANEL
CO2: 19 mEq/L (ref 19–32)
GFR calc non Af Amer: 29 mL/min — ABNORMAL LOW (ref >90)
Glucose, Bld: 109 mg/dL — ABNORMAL HIGH (ref 70–99)
Potassium: 3.5 mEq/L (ref 3.5–5.1)
Sodium: 138 mEq/L (ref 135–145)

## 2012-08-09 LAB — TROPONIN I
Troponin I: >20 ng/mL (ref <0.30)
Troponin I: >20 ng/mL (ref <0.30)
Troponin I: >20 ng/mL (ref <0.30)

## 2012-08-09 LAB — HEPARIN LEVEL (UNFRACTIONATED): Heparin Unfractionated: 0.52 IU/mL (ref 0.30–0.70)

## 2012-08-09 LAB — POCT ACTIVATED CLOTTING TIME: Activated Clotting Time: 165 seconds

## 2012-08-09 MED ORDER — ATROPINE SULFATE 1 MG/ML IJ SOLN
INTRAMUSCULAR | Status: AC
  Filled 2012-08-09: qty 1

## 2012-08-09 MED ORDER — METOPROLOL TARTRATE 12.5 MG HALF TABLET
12.5000 mg | ORAL_TABLET | Freq: Two times a day (BID) | ORAL | Status: DC
  Administered 2012-08-10 – 2012-08-11 (×4): 12.5 mg via ORAL
  Filled 2012-08-09 (×8): qty 1

## 2012-08-09 MED ORDER — LORAZEPAM 1 MG PO TABS
1.0000 mg | ORAL_TABLET | Freq: Four times a day (QID) | ORAL | Status: DC | PRN
  Administered 2012-08-09 – 2012-08-12 (×7): 1 mg via ORAL
  Filled 2012-08-09 (×7): qty 1

## 2012-08-09 MED ORDER — ASPIRIN EC 81 MG PO TBEC
81.0000 mg | DELAYED_RELEASE_TABLET | Freq: Every day | ORAL | Status: DC
  Administered 2012-08-09 – 2012-08-12 (×4): 81 mg via ORAL
  Filled 2012-08-09 (×4): qty 1

## 2012-08-09 MED ORDER — HEPARIN (PORCINE) IN NACL 100-0.45 UNIT/ML-% IJ SOLN
850.0000 [IU]/h | INTRAMUSCULAR | Status: DC
  Administered 2012-08-09: 850 [IU]/h via INTRAVENOUS
  Filled 2012-08-09: qty 250

## 2012-08-09 NOTE — Progress Notes (Signed)
Chaplain Note:  Chaplain responded to page to Cath Lab team, providing spiritual comfort and support for pt's family.  Chaplain supported pt's family and Cath Lab team by acting as liaison between lab and Pt's family.  Pt's family and Cath Lab team expressed appreciation for chaplain support.  Chaplain will follow up as needed.  08/09/12 0000  Clinical Encounter Type  Visited With Patient;Family  Visit Type Spiritual support  Referral From Other (Comment) (Page to Cath Lab Team)  Spiritual Encounters  Spiritual Needs Emotional  Stress Factors  Patient Stress Factors Health changes;Major life changes  Family Stress Factors Lack of knowledge;Major life changes  Verdie Shire, [address] [phone]

## 2012-08-09 NOTE — Progress Notes (Signed)
Subjective: Mild chest pressure this am, no nausea  Objective: Vital signs in last 24 hours: Temp:  [97.4 F (36.3 C)-98.6 F (37 C)] 98.6 F (37 C) (02/09 0800) Pulse Rate:  [35-87] 63 (02/09 0800) Resp:  [8-59] 14 (02/09 0800) BP: (88-127)/(43-79) 97/50 mmHg (02/09 0800) SpO2:  [95 %-100 %] 96 % (02/09 0800) Arterial Line BP: (117-152)/(52-77) 121/52 mmHg (02/09 0630) Weight:  [56.7 kg (125 lb)] 56.7 kg (125 lb) (02/08 1940) Weight change:    Intake/Output from previous day: +646 02/08 0701 - 02/09 0700 In: 646.2 [I.V.:646.2] Out: -  Intake/Output this shift:    PE: General:alert and oriented, pleasant affect, answered questions Heart:S1S2 RRR Lungs:clear ant. Abd:+ BS soft, non tender Ext:no edema, rt. Pedal + with doppler, lt 1+ Neuro:alert and oriented X 3 MAE follows commands   Lab Results:  Recent Labs  08/08/12 1936 08/08/12 2003 08/09/12 0555  WBC 10.9*  --  8.5  HGB 12.0 12.6 10.5*  HCT 35.6* 37.0 31.9*  PLT 277  --  240   BMET  Recent Labs  08/08/12 1936 08/08/12 2003 08/09/12 0555  NA 137 138 138  K 4.0 4.0 3.5  CL 100 105 106  CO2 21  --  19  GLUCOSE 172* 169* 109*  BUN 27* 28* 25*  CREATININE 1.61* 1.80* 1.51*  CALCIUM 10.0  --  9.1    Recent Labs  08/09/12 0555  TROPONINI >20.00*    Lab Results  Component Value Date   CHOL  Value: 191        ATP III CLASSIFICATION:  <200     mg/dL   Desirable  161-096  mg/dL   Borderline High  >=045    mg/dL   High        10/07/8117   HDL 93 07/26/2010   LDLCALC  Value: 83        Total Cholesterol/HDL:CHD Risk Coronary Heart Disease Risk Table                     Men   Women  1/2 Average Risk   3.4   3.3  Average Risk       5.0   4.4  2 X Average Risk   9.6   7.1  3 X Average Risk  23.4   11.0        Use the calculated Patient Ratio above and the CHD Risk Table to determine the patient's CHD Risk.        ATP III CLASSIFICATION (LDL):  <100     mg/dL   Optimal  147-829  mg/dL   Near or Above                     Optimal  130-159  mg/dL   Borderline  562-130  mg/dL   High  >865     mg/dL   Very High 7/84/6962   TRIG 77 07/26/2010   CHOLHDL 2.1 07/26/2010   Lab Results  Component Value Date   HGBA1C  Value: 5.6 (NOTE)                                                                       According to the ADA  Clinical Practice Recommendations for 2011, when HbA1c is used as a screening test:   >=6.5%   Diagnostic of Diabetes Mellitus           (if abnormal result  is confirmed)  5.7-6.4%   Increased risk of developing Diabetes Mellitus  References:Diagnosis and Classification of Diabetes Mellitus,Diabetes Care,2011,34(Suppl 1):S62-S69 and Standards of Medical Care in         Diabetes - 2011,Diabetes Care,2011,34  (Suppl 1):S11-S61. 07/25/2010     Lab Results  Component Value Date   TSH 0.643 07/25/2010    Hepatic Function Panel  Recent Labs  08/08/12 1936  PROT 6.2  ALBUMIN 3.2*  AST 57*  ALT 23  ALKPHOS 91  BILITOT 0.3     Studies/Results: cardiac cath emergent 08/08/12: LM: nl  LAD: no significant stenosis, 30% septal stenosis  LCX: nl  RCA: total prox occlusion with Timi 0 flow  PCI to RCA utilizing Asahi medium wire, 2.0 x 12 Sprinter, Express[place] thrombectomy 5 runs, 2.5 x 15 Emerge, 2.75 x 32 BM Veriflex stent, and 2.75 [place] Sprinter: 100% and prox to mid 60 - 70% stenosis to 0.  Temporary pacemaker inserted for heart block and P in 30's. Received atropine and dopamine transiently.      Medications: I have reviewed the patient's current medications. Marland Kitchen aspirin  324 mg Oral Once  . aspirin EC  325 mg Oral Daily  . clopidogrel  75 mg Oral Q breakfast  . simvastatin  20 mg Oral q1800   Assessment/Plan: Principal Problem:   STEMI (ST elevation myocardial infarction), to RCA with PCI 08/08/12 Active Problems:   Acute renal insufficiency   CAD (coronary artery disease), residual 30% septal stenosis   Bradycardia, on admitand cath lab requiring temp pacer for cath   Pulmonary  embolism, Jan 2012   DVT (deep venous thrombosis), 2011   HTN (hypertension)   Spinal stenosis   Complete heart block, transient with STEMI  PLAN: one troponin 3.69, second in process. Cr improved. K+ borderline will replace. will check CXR. Troponin now >20. Was on coumadin previously for DVT/PE. Though INR was not therapeutic on admit. ? Resume coumadin.  Discussed DNR, she does not want to be shocked or intubated but she does agree to IV management of BP/ arrhythmias if needed.    Cannot void in bed. Will do I&O cath now then bedrest is up at 1100AM.     Pt not placed on BB due to bradycardia on admit. BP borderline.  IV fluids at 125 ? Decrease today.   Echo ordered.  LOS: 1 day   INGOLD,LAURA R 08/09/2012, 8:30 AM    Patient seen and examined. Agree with assessment and plan. No recurrent chest pain. Rhythm stable, sinus rhythm in 60 - 70's. Trop >20. ECG with resolution of ST elevation. Start heparin 6 hrs after sheath out. Will decrease ASA to 81 mg tomorrow with Plavix. Defer coumadin for now to avoid triple drug therapy in this 79 yo female. Follow BP and HR and start very low dose beta-blocker when able to tolerate. 2d echo to assess LV function (LV gram not done due to renal insufficiency).   Lennette Bihari, MD, [facility] 08/09/2012 8:42 AM

## 2012-08-09 NOTE — Progress Notes (Signed)
Utilization Review Completed.Denna Fryberger T2/03/2013  

## 2012-08-09 NOTE — Progress Notes (Signed)
Sheath pulled at [phone]. Pressure held for 30 minutes. Patient tolerated well. Right groin soft, level 0. Pressure dressing applied. Right pedal pulse dopplerable during procedure.

## 2012-08-09 NOTE — Progress Notes (Signed)
ANTICOAGULATION CONSULT NOTE - Initial Consult  Pharmacy Consult for Heparin  Indication: hx of dvt/pe   Allergies  Allergen Reactions  . Codeine Nausea And Vomiting  . Penicillins Rash    Patient Measurements: Height: 5\' 3"  (160 cm) Weight: 125 lb (56.7 kg) IBW/kg (Calculated) : 52.4  Vital Signs: Temp: 98.6 F (37 C) (02/09 0800) Temp src: Oral (02/09 0800) BP: 97/50 mmHg (02/09 0800) Pulse Rate: 63 (02/09 0800)  Labs:  Recent Labs  08/08/12 1936 08/08/12 2003 08/09/12 0555  HGB 12.0 12.6 10.5*  HCT 35.6* 37.0 31.9*  PLT 277  --  240  APTT 30  --   --   LABPROT 14.6  --   --   INR 1.16  --   --   CREATININE 1.61* 1.80* 1.51*  TROPONINI  --   --  >20.00*    Estimated Creatinine Clearance: 19.3 ml/min (by C-G formula based on Cr of 1.51).   Medical History: Past Medical History  Diagnosis Date  . DVT (deep venous thrombosis) 2011  . Pulmonary embolism Jan 2012  . HTN (hypertension)   . Spinal stenosis   . Bradycardia, on admit 08/09/2012    Medications:  Coumadin PTA for hx of dvt/pe at a dose of 1.25mg  on TTSS only  Assessment: Miss Cathy Wallace is a 65 YOF admitted on 2/8 and is s/p cath on 2/9 with STEMI. Noted she has a history of DVT/PE and supposedly takes coumadin at home. Her INR on admission was only 1.16. Heparin is to begin 6 hours s/p sheath removal (0630) with no bolus per MD. Her CBC on admit was within normal limits. Plts good at 240.   Goal of Therapy:  Heparin level 0.3-0.7 units/ml Monitor platelets by anticoagulation protocol: Yes   Plan:  Begin heparin at 850 units/hr starting at 1230 F/u with 8 hour Heparin level, timed collect Daily heparin level and CBC  Thank you,  Brett Fairy, PharmD, BCPS 08/09/2012 9:03 AM

## 2012-08-09 NOTE — Progress Notes (Signed)
ANTICOAGULATION CONSULT NOTE   Pharmacy Consult for Heparin  Indication: hx of dvt/pe   Allergies  Allergen Reactions  . Codeine Nausea And Vomiting  . Penicillins Rash    Patient Measurements: Height: 5\' 3"  (160 cm) Weight: 125 lb (56.7 kg) IBW/kg (Calculated) : 52.4  Vital Signs: Temp: 99.6 F (37.6 C) (02/09 2000) Temp src: Oral (02/09 2000) BP: 83/48 mmHg (02/09 2000) Pulse Rate: 75 (02/09 1700)  Labs:  Recent Labs  08/08/12 1936 08/08/12 2003 08/09/12 0555 08/09/12 1240 08/09/12 1851 08/09/12 2009  HGB 12.0 12.6 10.5*  --   --   --   HCT 35.6* 37.0 31.9*  --   --   --   PLT 277  --  240  --   --   --   APTT 30  --   --   --   --   --   LABPROT 14.6  --   --   --   --   --   INR 1.16  --   --   --   --   --   HEPARINUNFRC  --   --   --   --   --  0.52  CREATININE 1.61* 1.80* 1.51*  --   --   --   TROPONINI  --   --  >20.00* >20.00* >20.00*  --     Estimated Creatinine Clearance: 19.3 ml/min (by C-G formula based on Cr of 1.51).   Medical History: Past Medical History  Diagnosis Date  . DVT (deep venous thrombosis) 2011  . Pulmonary embolism Jan 2012  . HTN (hypertension)   . Spinal stenosis   . Bradycardia, on admit 08/09/2012    Medications:  Coumadin PTA for hx of dvt/pe at a dose of 1.25mg  on TTSS only  Assessment: Cathy Wallace is a 89 YOF admitted on 2/8 and is s/p cath on 2/9 with STEMI. Noted she has a history of DVT/PE and supposedly takes coumadin at home. Her INR on admission was only 1.16.   Heparin level therapeutic  Goal of Therapy:  Heparin level 0.3-0.7 units/ml Monitor platelets by anticoagulation protocol: Yes   Plan:  1) Continue heparin at 850 units / hr 2) Follow up AM level  Thank you,  Cathy Wallace, PharmD 08/09/2012 9:00 PM

## 2012-08-10 DIAGNOSIS — I219 Acute myocardial infarction, unspecified: Secondary | ICD-10-CM

## 2012-08-10 HISTORY — DX: Acute myocardial infarction, unspecified: I21.9

## 2012-08-10 LAB — BASIC METABOLIC PANEL
BUN: 26 mg/dL — ABNORMAL HIGH (ref 6–23)
Chloride: 107 mEq/L (ref 96–112)
GFR calc Af Amer: 36 mL/min — ABNORMAL LOW (ref >90)
GFR calc non Af Amer: 31 mL/min — ABNORMAL LOW (ref >90)
Potassium: 3 mEq/L — ABNORMAL LOW (ref 3.5–5.1)

## 2012-08-10 LAB — CBC
HCT: 30.5 % — ABNORMAL LOW (ref 36.0–46.0)
HCT: 29.6 % — ABNORMAL LOW (ref 36.0–46.0)
Hemoglobin: 10.2 g/dL — ABNORMAL LOW (ref 12.0–15.0)
Hemoglobin: 10 g/dL — ABNORMAL LOW (ref 12.0–15.0)
MCH: 30.3 pg (ref 26.0–34.0)
MCH: 30.6 pg (ref 26.0–34.0)
MCHC: 33.4 g/dL (ref 30.0–36.0)
MCHC: 33.8 g/dL (ref 30.0–36.0)
MCV: 90.5 fL (ref 78.0–100.0)
RDW: 16.8 % — ABNORMAL HIGH (ref 11.5–15.5)
RDW: 16.7 % — ABNORMAL HIGH (ref 11.5–15.5)

## 2012-08-10 LAB — TROPONIN I: Troponin I: >20 ng/mL (ref <0.30)

## 2012-08-10 LAB — PRO B NATRIURETIC PEPTIDE: Pro B Natriuretic peptide (BNP): 5090 pg/mL — ABNORMAL HIGH (ref 0–450)

## 2012-08-10 LAB — TSH: TSH: 2.082 u[IU]/mL (ref 0.350–4.500)

## 2012-08-10 LAB — POCT ACTIVATED CLOTTING TIME: Activated Clotting Time: 192 seconds

## 2012-08-10 LAB — HEMOGLOBIN A1C: Hgb A1c MFr Bld: 6.1 % — ABNORMAL HIGH (ref <5.7)

## 2012-08-10 LAB — HEPARIN LEVEL (UNFRACTIONATED): Heparin Unfractionated: 0.85 IU/mL — ABNORMAL HIGH (ref 0.30–0.70)

## 2012-08-10 MED ORDER — POTASSIUM CHLORIDE CRYS ER 20 MEQ PO TBCR
EXTENDED_RELEASE_TABLET | ORAL | Status: AC
  Administered 2012-08-10: 40 meq via ORAL
  Filled 2012-08-10: qty 2

## 2012-08-10 MED ORDER — POTASSIUM CHLORIDE CRYS ER 20 MEQ PO TBCR
40.0000 meq | EXTENDED_RELEASE_TABLET | Freq: Once | ORAL | Status: AC
  Administered 2012-08-10: 40 meq via ORAL

## 2012-08-10 MED ORDER — HEPARIN (PORCINE) IN NACL 100-0.45 UNIT/ML-% IJ SOLN
700.0000 [IU]/h | INTRAMUSCULAR | Status: DC
  Filled 2012-08-10: qty 250

## 2012-08-10 MED ORDER — LOPERAMIDE HCL 2 MG PO CAPS
2.0000 mg | ORAL_CAPSULE | ORAL | Status: DC | PRN
  Administered 2012-08-10 (×2): 2 mg via ORAL
  Filled 2012-08-10 (×2): qty 1

## 2012-08-10 MED FILL — Dextrose Inj 5%: INTRAVENOUS | Qty: 50 | Status: AC

## 2012-08-10 NOTE — Progress Notes (Signed)
  Echocardiogram 2D Echocardiogram has been performed.  Armella Stogner 08/10/2012, 11:40 AM

## 2012-08-10 NOTE — Care Management Note (Addendum)
    Page 1 of 1   08/12/2012     1:09:15 PM   CARE MANAGEMENT NOTE 08/12/2012  Patient:  NICOLA, HEINEMANN   Account Number:  [id number]  Date Initiated:  08/10/2012  Documentation initiated by:  Junius Creamer  Subjective/Objective Assessment:   adm w mi     Action/Plan:   lives in indep apt at [facility] but she states she may go to rehab at [facility]. will make sw ref.pcp dr r gates   Anticipated DC Date:  08/12/2012   Anticipated DC Plan:  SKILLED NURSING FACILITY  In-house referral  Clinical Social Worker      DC Planning Services  CM consult      Choice offered to / List presented to:             Status of service:  Completed, signed off Medicare Important Message given?   (If response is "NO", the following Medicare IM given date fields will be blank) Date Medicare IM given:   Date Additional Medicare IM given:    Discharge Disposition:  SKILLED NURSING FACILITY  Per UR Regulation:  Reviewed for med. necessity/level of care/duration of stay  If discussed at Long Length of Stay Meetings, dates discussed:    Comments:  08/12/12 Talli Kimmer,RN,BSN [phone] PT DISCHARGED TO SNF TODAY, PER CSW ARRRANGEMENTS.  08/11/12 Collier Bohnet,RN,BSN [phone] PT FROM INDEPENDENT LIVING AT [facility]; WISHES TO DC TO SKILLED NURSING AT [facility].  CSW FOLLOWING TO FACILITATE THIS.  WILL FOLLOW.  2/10 1240 debbie dowell rn,bsn

## 2012-08-10 NOTE — Progress Notes (Signed)
Clinical Social Work Department BRIEF PSYCHOSOCIAL ASSESSMENT 08/10/2012  Patient:  Cathy Wallace, Cathy Wallace     Account Number:  [id number]     Admit date:  08/08/2012  Clinical Social Worker:  Dennison Bulla  Date/Time:  08/10/2012 04:00 PM  Referred by:  Physician  Date Referred:  08/10/2012 Referred for  SNF Placement   Other Referral:   Interview type:  Patient Other interview type:    PSYCHOSOCIAL DATA Living Status:  FACILITY Admitted from facility:  [facility] Level of care:  Independent Living Primary support name:  Peyton Najjar Primary support relationship to patient:  CHILD, ADULT Degree of support available:   Strong    CURRENT CONCERNS Current Concerns  Post-Acute Placement   Other Concerns:    SOCIAL WORK ASSESSMENT / PLAN CSW received referral due to patient being admitted from a facility. CSW met with patient and son at bedside. Patient agreeable to son involvement.    CSW introduced myself and explained role. Patient lives at independent living at [facility] but reports that she feels she will need some rehab prior to returning to her apartment. CSW explained process and patient agreeable for CSW to contact facility. CSW spoke with Durward Mallard at [facility] who is agreeable to patient returning to SNF at dc.    CSW completed FL2 and placed in chart for MD signature. CSW will continue to follow.   Assessment/plan status:  Psychosocial Support/Ongoing Assessment of Needs Other assessment/ plan:   Information/referral to community resources:   Patient denied SNF list due to living at [facility] who provides all three levels of care. Patient desires to return to [facility] at dc.    PATIENT'S/FAMILY'S RESPONSE TO PLAN OF CARE: Patient alert and oriented. Patient and son engaged in assessment and appreciative of CSW consult.

## 2012-08-10 NOTE — Progress Notes (Signed)
[facility]  Subjective: SOB.  No Cp  Objective: Vital signs in last 24 hours: Temp:  [97.8 F (36.6 C)-99.6 F (37.6 C)] 97.8 F (36.6 C) (02/10 0400) Pulse Rate:  [62-75] 70 (02/09 2200) Resp:  [11-21] 16 (02/10 0400) BP: (64-153)/(38-108) 105/76 mmHg (02/10 0600) SpO2:  [90 %-97 %] 96 % (02/10 0400) Last BM Date: 08/07/12  Intake/Output from previous day: 02/09 0701 - 02/10 0700 In: 2971.4 [P.O.:840; I.V.:2131.4] Out: 1451 [Urine:1450; Stool:1] Intake/Output this shift:    Medications Current Facility-Administered Medications  Medication Dose Route Frequency Provider Last Rate Last Dose  . 0.9 %  sodium chloride infusion   Intravenous Continuous Loren Racer, MD 999 mL/hr at 08/08/12 1938 1,000 mL at 08/08/12 1938  . 0.9 %  sodium chloride infusion   Intravenous Continuous Nada Boozer, NP 75 mL/hr at 08/09/12 1506    . acetaminophen (TYLENOL) tablet 650 mg  650 mg Oral Q4H PRN Lennette Bihari, MD      . aspirin EC tablet 81 mg  81 mg Oral Daily Lennette Bihari, MD   81 mg at 08/09/12 1057  . clopidogrel (PLAVIX) tablet 75 mg  75 mg Oral Q breakfast Lennette Bihari, MD   75 mg at 08/10/12 0757  . heparin ADULT infusion 100 units/mL (25000 units/250 mL)  700 Units/hr Intravenous Continuous Lennette Bihari, MD 7 mL/hr at 08/10/12 0800 700 Units/hr at 08/10/12 0800  . LORazepam (ATIVAN) tablet 1 mg  1 mg Oral Q6H PRN Nada Boozer, NP   1 mg at 08/10/12 0757  . metoprolol tartrate (LOPRESSOR) tablet 12.5 mg  12.5 mg Oral BID Nada Boozer, NP      . nitroGLYCERIN (NITROSTAT) SL tablet 0.4 mg  0.4 mg Sublingual Q5 Min x 3 PRN Eda Paschal [place], PA      . nitroGLYCERIN 0.2 mg/mL in dextrose 5 % infusion  2-200 mcg/min Intravenous Continuous Lennette Bihari, MD      . ondansetron [facility]) injection 4 mg  4 mg Intravenous Q6H PRN Lennette Bihari, MD      . simvastatin (ZOCOR) tablet 20 mg  20 mg Oral q1800 Eda Paschal [place], [place]   20 mg at 08/09/12 1720     PE: General appearance: alert, cooperative and no distress Lungs: clear to auscultation bilaterally Heart: regular rate and rhythm, S1, S2 normal, no murmur, click, rub or gallop Extremities: No LEE Pulses: 2+ and symmetric Skin: Warm and dry Neurologic: Grossly normal  Lab Results:   Recent Labs  08/08/12 1936 08/08/12 2003 08/09/12 0555 08/10/12 0600  WBC 10.9*  --  8.5 9.1  HGB 12.0 12.6 10.5* 10.2*  HCT 35.6* 37.0 31.9* 30.5*  PLT 277  --  240 207   BMET  Recent Labs  08/08/12 1936 08/08/12 2003 08/09/12 0555  NA 137 138 138  K 4.0 4.0 3.5  CL 100 105 106  CO2 21  --  19  GLUCOSE 172* 169* 109*  BUN 27* 28* 25*  CREATININE 1.61* 1.80* 1.51*  CALCIUM 10.0  --  9.1   PT/INR  Recent Labs  08/08/12 1936  LABPROT 14.6  INR 1.16   Lipid Panel     Component Value Date/Time   CHOL  Value: 191        ATP III CLASSIFICATION:  <200     mg/dL   Desirable  469-629  mg/dL   Borderline High  >=528    mg/dL   High  07/26/2010 0345   TRIG 77 07/26/2010 0345   HDL 93 07/26/2010 0345   CHOLHDL 2.1 07/26/2010 0345   VLDL 15 07/26/2010 0345   LDLCALC  Value: 83        Total Cholesterol/HDL:CHD Risk Coronary Heart Disease Risk Table                     Men   Women  1/2 Average Risk   3.4   3.3  Average Risk       5.0   4.4  2 X Average Risk   9.6   7.1  3 X Average Risk  23.4   11.0        Use the calculated Patient Ratio above and the CHD Risk Table to determine the patient's CHD Risk.        ATP III CLASSIFICATION (LDL):  <100     mg/dL   Optimal  478-295  mg/dL   Near or Above                    Optimal  130-159  mg/dL   Borderline  621-308  mg/dL   High  >657     mg/dL   Very High 8/46/9629 5284    Cardiac Panel (last 3 results)  Recent Labs  08/09/12 1240 08/09/12 1851 08/10/12 0600  TROPONINI >20.00* >20.00* >20.00*     Studies/Results: Full note dictated; see diagram in chart  DICTATION # [id number], [id number]  Ao: 120/60  LV: 120/5  LM: nl  LAD: no  significant stenosis, 30% septal stenosis  LCX: nl  RCA: total prox occlusion with Timi 0 flow  PCI to RCA utilizing Asahi medium wire, 2.0 x 12 Sprinter, Expressway thrombectomy 5 runs, 2.5 x 15 Emerge, 2.75 x 32 BM Veriflex stent, and 2.75 [place] Sprinter: 100% and prox to mid 60 - 70% stenosis to 0.  Temporary pacemaker inserted for heart block and P in 30's. Received atropine and dopamine transiently.  LV gram not done due to renal insufficiency.  Lennette Bihari, MD, [facility]  08/08/2012  10:31 PM  Assessment/Plan Principal Problem:   STEMI (ST elevation myocardial infarction), to RCA with PCI 08/08/12 Active Problems:   Pulmonary embolism, Jan 2012   DVT (deep venous thrombosis), 2011   HTN (hypertension)   Acute renal insufficiency   Spinal stenosis   CAD (coronary artery disease), residual 30% septal stenosis   Bradycardia, on admitand cath lab requiring temp pacer for cath   Complete heart block, transient with STEMI  Plan:   S/P STEMI.  PCI to RCA utilizing Asahi medium wire, 2.0 x 12 Sprinter, Expressway thrombectomy 5 runs, 2.5 x 15 Emerge, 2.75 x 32 BM Veriflex stent, and 2.75 [place] Sprinter: 100% and prox to mid 60 - 70% stenosis to 0.  Temporary pacemaker inserted for heart block and P in 30's.  BP and HR stable.  ASA, plavix, lopressor.    BNP, TSH and A1C  in process.    DC heparin.   Transfer to tele later today   LOS: 2 days    HAGER, BRYAN 08/10/2012 8:38 AM  Patient seen and examined. Agree with assessment and plan. Day 2 s/p IMI with trop >20. No recurrent anginal symptoms or recurrent heart block. Has not yet received metoprolol secondary to BP. Will decrease fluids to 50 cc/hr, still not eating well. Renal function pending from this am.  Will transfer to 2000 later today. Aim for DC on 08/12/12. DC  heparin; will not restart coumadin while on ASA/Plavix.   Lennette Bihari, MD, [facility] 08/10/2012 8:57 AM

## 2012-08-10 NOTE — Progress Notes (Addendum)
ANTICOAGULATION CONSULT NOTE  Pharmacy Consult for Heparin  Indication: H/O DVT/PE  Allergies  Allergen Reactions  . Codeine Nausea And Vomiting  . Penicillins Rash    Patient Measurements: Height: 5\' 3"  (160 cm) Weight: 125 lb (56.7 kg) IBW/kg (Calculated) : 52.4  Vital Signs: Temp: 97.8 F (36.6 C) (02/10 0400) Temp src: Oral (02/10 0000) BP: 105/76 mmHg (02/10 0600) Pulse Rate: 70 (02/09 2200)  Labs:  Recent Labs  08/08/12 1936 08/08/12 2003 08/09/12 0555 08/09/12 1240 08/09/12 1851 08/09/12 2009 08/10/12 0600  HGB 12.0 12.6 10.5*  --   --   --  10.2*  HCT 35.6* 37.0 31.9*  --   --   --  30.5*  PLT 277  --  240  --   --   --  207  APTT 30  --   --   --   --   --   --   LABPROT 14.6  --   --   --   --   --   --   INR 1.16  --   --   --   --   --   --   HEPARINUNFRC  --   --   --   --   --  0.52 0.85*  CREATININE 1.61* 1.80* 1.51*  --   --   --   --   TROPONINI  --   --  >20.00* >20.00* >20.00*  --   --     Estimated Creatinine Clearance: 19.3 ml/min (by C-G formula based on Cr of 1.51).  Assessment:  79 YO Female with a history of DVT/PE s/p cath 2/9 for heparin.  RN reports some bleeding around IV sites as level has trended upward from yesterday's level.  Heparin level therapeutic  Goal of Therapy:  Heparin level 0.3-0.7 units/ml Monitor platelets by anticoagulation protocol: Yes   Plan:  Hold heparin x 1 hour, then decrease heparin 700 units/hr Check heparin level in 8 hours.  Geannie Risen, PharmD, BCPS  08/10/2012 7:07 AM

## 2012-08-10 NOTE — Progress Notes (Signed)
Clinical Social Work Department CLINICAL SOCIAL WORK PLACEMENT NOTE 08/10/2012  Patient:  Cathy Wallace, Cathy Wallace  Account Number:  [id number] Admit date:  08/08/2012  Clinical Social Worker:  Unk Lightning, LCSW  Date/time:  08/10/2012 04:00 PM  Clinical Social Work is seeking post-discharge placement for this patient at the following level of care:   SKILLED NURSING   (*CSW will update this form in Epic as items are completed)    N/A Patient/family provided with [facility] Department of Clinical Social Work's list of facilities offering this level of care within the geographic area requested by the patient (or if unable, by the patient's family).     N/A Patient/family informed of their freedom to choose among providers that offer the needed level of care, that participate in Medicare, Medicaid or managed care program needed by the patient, have an available bed and are willing to accept the patient.      N/APatient/family informed of [facility]' ownership interest in [facility], as well as of the fact that they are under no obligation to receive care at this facility.  PASARR submitted to EDS on 08/10/2012 PASARR number received from EDS on 08/10/2012  FL2 transmitted to all facilities in geographic area requested by pt/family on  08/10/2012 FL2 transmitted to all facilities within larger geographic area on   Patient informed that his/her managed care company has contracts with or will negotiate with  certain facilities, including the following:     Patient/family informed of bed offers received:  08/10/2012 Patient chooses bed at [facility] Physician recommends and patient chooses bed at    Patient to be transferred to [facility] on   Patient to be transferred to facility by   The following physician request were entered in Epic:   Additional Comments:

## 2012-08-10 NOTE — Progress Notes (Signed)
CARDIAC REHAB PHASE I   PRE:  Rate/Rhythm: 79 SR  BP:  Supine: 125/68  Sitting:   Standing:    SaO2: 93 RA  MODE:  Ambulation: 210 ft   POST:  Rate/Rhythem: 104 ST  BP:  Supine: 132/80  Sitting:   Standing:    SaO2: 95 RA 1020-1045 Assisted X 2 and used walker to ambulate. Gait steady  with walker. Pt tolerated walk well without c/o of cp, states that she SOB by end of walk. Pt tired by end of walk, back to bed. VS stable. Pt given MI book and discussed stent card. Call light in reach.  Beatrix Fetters

## 2012-08-11 LAB — BASIC METABOLIC PANEL
BUN: 25 mg/dL — ABNORMAL HIGH (ref 6–23)
CO2: 19 mEq/L (ref 19–32)
Chloride: 111 mEq/L (ref 96–112)
GFR calc Af Amer: 50 mL/min — ABNORMAL LOW (ref >90)
Glucose, Bld: 144 mg/dL — ABNORMAL HIGH (ref 70–99)
Potassium: 2.9 mEq/L — ABNORMAL LOW (ref 3.5–5.1)

## 2012-08-11 MED ORDER — POTASSIUM CHLORIDE CRYS ER 20 MEQ PO TBCR
40.0000 meq | EXTENDED_RELEASE_TABLET | Freq: Once | ORAL | Status: AC
  Administered 2012-08-11: 40 meq via ORAL
  Filled 2012-08-11: qty 2

## 2012-08-11 NOTE — Progress Notes (Signed)
08/11/2012 1150 Pt. Potassium noted to be 2.9 on labs this am. [facility] paged and made aware. Verbal orders received to give 40 meq Po KCL now. Orders enacted. Will continue to closely monitor patient.  Loyalty Brashier, Blanchard Kelch

## 2012-08-11 NOTE — Progress Notes (Signed)
[facility]  Subjective: Denies CP. Feels a bit depressed being in hospital. Ready to go home soon. Pt reports having hematochezia x 1, yesterday morning. She reported issue to [name] yesterday. The patient states that [name] believed it to be related to the combination of blood thinners and antiplatelets. She denies further episodes.   Objective: Vital signs in last 24 hours: Temp:  [97.7 F (36.5 C)-99.1 F (37.3 C)] 97.7 F (36.5 C) (02/11 0456) Pulse Rate:  [62-76] 62 (02/11 0456) Resp:  [16-18] 18 (02/11 0456) BP: (121-150)/(58-98) 148/64 mmHg (02/11 0456) SpO2:  [93 %-99 %] 99 % (02/11 0456) Last BM Date: 08/10/12  Intake/Output from previous day: 02/10 0701 - 02/11 0700 In: 1482 [P.O.:600; I.V.:882] Out: -  Intake/Output this shift:    Medications Current Facility-Administered Medications  Medication Dose Route Frequency Provider Last Rate Last Dose  . 0.9 %  sodium chloride infusion   Intravenous Continuous Loren Racer, MD 999 mL/hr at 08/08/12 1938 1,000 mL at 08/08/12 1938  . 0.9 %  sodium chloride infusion   Intravenous Continuous Lennette Bihari, MD 50 mL/hr at 08/10/12 2149    . acetaminophen (TYLENOL) tablet 650 mg  650 mg Oral Q4H PRN Lennette Bihari, MD      . aspirin EC tablet 81 mg  81 mg Oral Daily Lennette Bihari, MD   81 mg at 08/10/12 1011  . clopidogrel (PLAVIX) tablet 75 mg  75 mg Oral Q breakfast Lennette Bihari, MD   75 mg at 08/10/12 0757  . loperamide (IMODIUM) capsule 2 mg  2 mg Oral PRN Wilburt Finlay, PA   2 mg at 08/10/12 1218  . LORazepam (ATIVAN) tablet 1 mg  1 mg Oral Q6H PRN Nada Boozer, NP   1 mg at 08/10/12 1550  . metoprolol tartrate (LOPRESSOR) tablet 12.5 mg  12.5 mg Oral BID Nada Boozer, NP   12.5 mg at 08/10/12 2149  . nitroGLYCERIN (NITROSTAT) SL tablet 0.4 mg  0.4 mg Sublingual Q5 Min x 3 PRN Eda Paschal Kilroy, PA      . nitroGLYCERIN 0.2 mg/mL in dextrose 5 % infusion  2-200 mcg/min Intravenous  Continuous Lennette Bihari, MD      . ondansetron [facility]) injection 4 mg  4 mg Intravenous Q6H PRN Lennette Bihari, MD      . simvastatin (ZOCOR) tablet 20 mg  20 mg Oral q1800 Eda Paschal [place], [place]   20 mg at 08/10/12 1550    PE: General appearance: alert, cooperative and no distress Lungs: clear to auscultation bilaterally Heart: regular rate and rhythm Extremities: no LEE Pulses: 2+ and symmetric Skin: warm and dry Neurologic: Grossly normal  Lab Results:   Recent Labs  08/09/12 0555 08/10/12 0600 08/10/12 1552  WBC 8.5 9.1 10.8*  HGB 10.5* 10.2* 10.0*  HCT 31.9* 30.5* 29.6*  PLT 240 207 238   BMET  Recent Labs  08/08/12 1936 08/08/12 2003 08/09/12 0555 08/10/12 0600  NA 137 138 138 140  K 4.0 4.0 3.5 3.0*  CL 100 105 106 107  CO2 21  --  19 19  GLUCOSE 172* 169* 109* 103*  BUN 27* 28* 25* 26*  CREATININE 1.61* 1.80* 1.51* 1.40*  CALCIUM 10.0  --  9.1 8.6   PT/INR  Recent Labs  08/08/12 1936  LABPROT 14.6  INR 1.16   Cardiac Panel (last 3 results)  Recent Labs  08/09/12 1851 08/10/12 0600 08/11/12 0620  TROPONINI >  20.00* >20.00* 10.61*    Studies/Results: Stool guiac 2/10 - posititive  Assessment/Plan  Principal Problem:   STEMI (ST elevation myocardial infarction), to RCA with PCI 08/08/12 Active Problems:   Pulmonary embolism, Jan 2012   DVT (deep venous thrombosis), 2011   HTN (hypertension)   Acute renal insufficiency   Spinal stenosis   CAD (coronary artery disease), residual 30% septal stenosis   Bradycardia, on admitand cath lab requiring temp pacer for cath   Complete heart block, transient with STEMI   Plan: Day 3 S/P STEMI. PCI and stenting to RCA, using a BMS. Troponin level down-trending. CP free. She is on ASA and Plavix for dual antiplatelet therapy. Hematochezia has resolved. Heparin was D/C yesterday. Home warfarin will not be resumed. H/H stable. Low dose BB was resumed yesterday. It has initially held due to hypotension. BP  is now stable. Most recent BP was 148/64. Pt is also on a statin. Not on an ACE-I. Consider adding ACE-I once renal function improves. Cr. Has been down-trending, but remains elevated. Cr yesterday was 1.40. Today's labs pending. Will continue to follow. Pt has temporary pacer for heart block. HR stable. No red alarms on telemetry overnight.    LOS: 3 days    Brittainy M. Delmer Islam  Agree with note written by Boyce Medici  PAC  No CP. POD #3 inferior STEMI Rx with BMS to RCA. Good LV fxn. Lives at [facility]. [facility]. Ambulate today. Can prob be D/Cd home tomorrow to Rehab portion of [facility]. Pts Son to arrange. ROV with DR. Perry Mount J 08/11/2012 8:53 AM 08/11/2012 7:58 AM

## 2012-08-11 NOTE — Progress Notes (Signed)
CARDIAC REHAB PHASE I   PRE:  Rate/Rhythm: 62SR  BP:  Supine:   Sitting: 150/72  Standing:    SaO2: 93%RA  MODE:  Ambulation: 340 ft   POST:  Rate/Rhythem: 69SR  BP:  Supine:   Sitting: 150/72  Standing:    SaO2: 99%RA 1140-1205 Pt walked 340 ft on RA with rolling walker and asst x 1. Gait fairly steady. Used gait belt. Encouraged pt to walk as tolerated at Rehab. Pt states very nervous and would like something for her nerves. Notified pt's RN.  To chair with call bell. No CP.  Duanne Limerick

## 2012-08-12 LAB — BASIC METABOLIC PANEL
BUN: 19 mg/dL (ref 6–23)
CO2: 17 mEq/L — ABNORMAL LOW (ref 19–32)
Chloride: 114 mEq/L — ABNORMAL HIGH (ref 96–112)
GFR calc Af Amer: 55 mL/min — ABNORMAL LOW (ref >90)
Potassium: 3.8 mEq/L (ref 3.5–5.1)

## 2012-08-12 MED ORDER — CLOPIDOGREL BISULFATE 75 MG PO TABS: 75.0000 mg | ORAL_TABLET | Freq: Every day | ORAL | Status: DC

## 2012-08-12 MED ORDER — NITROGLYCERIN 0.4 MG SL SUBL: 0.4000 mg | SUBLINGUAL_TABLET | SUBLINGUAL | Status: AC | PRN

## 2012-08-12 MED ORDER — ASPIRIN 81 MG PO TBEC: 81.0000 mg | DELAYED_RELEASE_TABLET | Freq: Every day | ORAL | Status: DC

## 2012-08-12 MED ORDER — METOPROLOL TARTRATE 25 MG PO TABS
25.0000 mg | ORAL_TABLET | Freq: Two times a day (BID) | ORAL | Status: DC
  Filled 2012-08-12 (×2): qty 1

## 2012-08-12 MED ORDER — METOPROLOL TARTRATE 25 MG PO TABS: 25.0000 mg | ORAL_TABLET | Freq: Two times a day (BID) | ORAL | Status: DC

## 2012-08-12 MED ORDER — SIMVASTATIN 20 MG PO TABS: 20.0000 mg | ORAL_TABLET | Freq: Every day | ORAL | Status: DC

## 2012-08-12 NOTE — Progress Notes (Signed)
Pt. Seen and examined. Agree with the NP/PA-C note as written.  No further chest pain or bradycardia. Blood pressure is elevated, will treat. Ok for d/c today to rehab. Follow-up in our office in 5-7 days.  Chrystie Nose, MD, [facility] Attending Cardiologist [facility]

## 2012-08-12 NOTE — Progress Notes (Signed)
[id number] Cardiac Rehab On arrival to pt's room she was up in room anxious wanting to get on clothes to go home. Attempted to get pt to relax and explained to her that we have to wait for discharge order before she could go to SNF. Got pt in recliner and things where she could reach them and reassured her. This seemed to make pt more comfortable. Completed NTG education with her. She voices understanding.

## 2012-08-12 NOTE — Discharge Summary (Signed)
Physician Discharge Summary  Patient ID: MONETTE OMARA MRN: [id number] DOB/AGE: 01/08/1917 79 y.o.  Admit date: 08/08/2012 Discharge date: 08/12/2012  Admission Diagnoses:  STEMI  Discharge Diagnoses:  Principal Problem:   STEMI (ST elevation myocardial infarction), to RCA with PCI 08/08/12 Active Problems:   Pulmonary embolism, Jan 2012   DVT (deep venous thrombosis), 2011   HTN (hypertension)   Acute renal insufficiency   Spinal stenosis   CAD (coronary artery disease), residual 30% septal stenosis   Bradycardia, on admitand cath lab requiring temp pacer for cath   Complete heart block, transient with STEMI   Discharged Condition: stable  Hospital Course:   79 y/o active female who lives at [facility], admitted with complaints of weakness. She apparently could not get up off the floor. She reportedly had 3 hours of chest pain around 10am. Her EKG in the ER showed sinus bradycardia and inferior ST elevation. After discussion with the pt and family it was felt that despite the pt's advanced age she was active and otherwise healthy and it would be appropriate to take her to the cath lab. Her past medical history is significant for DVT in 2011. She was Rx'd with Warfarin for 9 mos. Three months after Warfarin was stopped she was admitted for a "NSTEMI" with syncope. She was worked up by Dr Eldridge Dace and found to have bilateral pulmonary embolism. She reportedly is on Warfarin now but her INR on admission was 1.16. She is a DNR.  She was taken to the cath lab and received a BM Veriflex stent to the RCA.  A temporary PM was placed for heart block and ultimately removed.  She was continued on IV fluids to help with hypotension.  Lopressor was initiated then titrated to 25mg   BID.  2D echo showed good LV function 55-60%.  Grade one diastolic dysfunction.  Mild Pulm HTN.   Heparin was discontinued and it was decided not to restart coumadin.  She was seen by Dr. Rennis Golden who felt she was stable for  DC to [facility].  ROV with [name].   Consults: None  Significant Diagnostic Studies: 2D Echo, Study Conclusions  - Left ventricle: Systolic function was normal. The estimated ejection fraction was in the range of 55% to 60%. Although no diagnostic regional wall motion abnormality was identified, this possibility cannot be completely excluded on the basis of this study. Doppler parameters are consistent with abnormal left ventricular relaxation (grade 1 diastolic dysfunction). - Ventricular septum: The contour showed diastolic flattening. These changes are consistent with RV volume overload. - Aortic valve: Trivial regurgitation. - Mitral valve: Calcified annulus. - Right ventricle: The cavity size was moderately dilated. Systolic function was mildly reduced. - Right atrium: The atrium was mildly dilated. - Atrial septum: A septal defect cannot be excluded. - Tricuspid valve: Moderate-severe regurgitation directed centrally. - Pulmonary arteries: PA peak pressure: 40mm Hg (S).    DICTATION # [id number], [id number]  Ao: 120/60  LV: 120/5  LM: nl  LAD: no significant stenosis, 30% septal stenosis  LCX: nl  RCA: total prox occlusion with Timi 0 flow  PCI to RCA utilizing Asahi medium wire, 2.0 x 12 Sprinter, Expressway thrombectomy 5 runs, 2.5 x 15 Emerge, 2.75 x 32 BM Veriflex stent, and 2.75 Kyle Sprinter: 100% and prox to mid 60 - 70% stenosis to 0.  Temporary pacemaker inserted for heart block and P in 30's. Received atropine and dopamine transiently.  LV gram not done due to renal insufficiency.  Nicki Guadalajara  A, MD, [facility]  08/08/2012  CBC    Component Value Date/Time   WBC 10.8* 08/10/2012 1552   WBC 7.0 02/12/2010 1327   RBC 3.27* 08/10/2012 1552   RBC 4.41 02/12/2010 1327   HGB 10.0* 08/10/2012 1552   HGB 13.5 02/12/2010 1327   HCT 29.6* 08/10/2012 1552   HCT 41.2 02/12/2010 1327   PLT 238 08/10/2012 1552   PLT 283 02/12/2010 1327   MCV 90.5 08/10/2012 1552   MCV 93.5  02/12/2010 1327   MCH 30.6 08/10/2012 1552   MCH 30.6 02/12/2010 1327   MCHC 33.8 08/10/2012 1552   MCHC 32.7 02/12/2010 1327   RDW 16.7* 08/10/2012 1552   RDW 15.2* 02/12/2010 1327   LYMPHSABS 1.0 07/24/2010 2130   LYMPHSABS 1.9 02/12/2010 1327   MONOABS 1.8* 07/24/2010 2130   MONOABS 0.5 02/12/2010 1327   EOSABS 0.0 07/24/2010 2130   EOSABS 0.1 02/12/2010 1327   BASOSABS 0.0 07/24/2010 2130   BASOSABS 0.0 02/12/2010 1327    BMET    Component Value Date/Time   NA 144 08/12/2012 0645   K 3.8 08/12/2012 0645   CL 114* 08/12/2012 0645   CO2 17* 08/12/2012 0645   GLUCOSE 109* 08/12/2012 0645   BUN 19 08/12/2012 0645   CREATININE 0.99 08/12/2012 0645   CALCIUM 8.4 08/12/2012 0645   GFRNONAA 48* 08/12/2012 0645   GFRAA 55* 08/12/2012 0645    Lipid Panel     Component Value Date/Time   CHOL  Value: 191        ATP III CLASSIFICATION:  <200     mg/dL   Desirable  409-811  mg/dL   Borderline High  >=914    mg/dL   High        7/82/9562 0345   TRIG 77 07/26/2010 0345   HDL 93 07/26/2010 0345   CHOLHDL 2.1 07/26/2010 0345   VLDL 15 07/26/2010 0345   LDLCALC  Value: 83        Total Cholesterol/HDL:CHD Risk Coronary Heart Disease Risk Table                     Men   Women  1/2 Average Risk   3.4   3.3  Average Risk       5.0   4.4  2 X Average Risk   9.6   7.1  3 X Average Risk  23.4   11.0        Use the calculated Patient Ratio above and the CHD Risk Table to determine the patient's CHD Risk.        ATP III CLASSIFICATION (LDL):  <100     mg/dL   Optimal  130-865  mg/dL   Near or Above                    Optimal  130-159  mg/dL   Borderline  784-696  mg/dL   High  >295     mg/dL   Very High 2/84/1324 4010       Discharge Exam: Blood pressure 158/72, pulse 68, temperature 98.3 F (36.8 C), temperature source Oral, resp. rate 18, height 5\' 3"  (1.6 m), weight 56.7 kg (125 lb), SpO2 98.00%.   Disposition:   Discharge Orders   Future Orders Complete By Expires     Diet - low sodium heart healthy  As  directed     Increase activity slowly  As directed  Medication List    STOP taking these medications       diltiazem 180 MG 24 hr capsule  Commonly known as:  DILACOR XR     triamterene-hydrochlorothiazide 37.5-25 MG per capsule  Commonly known as:  DYAZIDE     warfarin 2.5 MG tablet  Commonly known as:  COUMADIN      TAKE these medications       aspirin 81 MG EC tablet  Take 1 tablet (81 mg total) by mouth daily.     clopidogrel 75 MG tablet  Commonly known as:  PLAVIX  Take 1 tablet (75 mg total) by mouth daily with breakfast.     gabapentin 300 MG capsule  Commonly known as:  NEURONTIN  Take 300 mg by mouth 3 (three) times daily as needed. Back pain     loratadine 10 MG tablet  Commonly known as:  CLARITIN  Take 10 mg by mouth daily.     LORazepam 1 MG tablet  Commonly known as:  ATIVAN  Take 1-2 mg by mouth See admin instructions. Take 1 tablet in the morning as needed and 2 at bedtime     metoprolol tartrate 25 MG tablet  Commonly known as:  LOPRESSOR  Take 1 tablet (25 mg total) by mouth 2 (two) times daily.     nitroGLYCERIN 0.4 MG SL tablet  Commonly known as:  NITROSTAT  Place 1 tablet (0.4 mg total) under the tongue every 5 (five) minutes x 3 doses as needed for chest pain.     sertraline 25 MG tablet  Commonly known as:  ZOLOFT  Take 25 mg by mouth daily.     simvastatin 20 MG tablet  Commonly known as:  ZOCOR  Take 1 tablet (20 mg total) by mouth daily at 6 PM.           Follow-up Information   Follow up with Abelino Derrick, PA On 08/24/2012. (11:15 am)    Contact information:   [address] [place] [address] [phone]       Signed: Wilburt Finlay 08/12/2012, 10:33 AM

## 2012-08-12 NOTE — Clinical Social Work Note (Signed)
CSW faxed dc info. Pt's son at bedside and able to transport Pt to [facility]. RN given number to call report to SNF. No further CSW needs.   Frederico Hamman, LCSW  [phone]

## 2012-08-12 NOTE — Progress Notes (Signed)
[facility]  Subjective: No complaints.  Ready to go home!  Objective: Vital signs in last 24 hours: Temp:  [97.9 F (36.6 C)-98.3 F (36.8 C)] 98.3 F (36.8 C) (02/12 0456) Pulse Rate:  [60-99] 99 (02/12 0456) Resp:  [18] 18 (02/12 0456) BP: (131-155)/(58-80) 155/80 mmHg (02/12 0456) SpO2:  [97 %-99 %] 98 % (02/12 0456) Last BM Date: 08/10/12  Intake/Output from previous day: 02/11 0701 - 02/12 0700 In: 720 [P.O.:720] Out: 201 [Urine:200; Stool:1] Intake/Output this shift:    Medications Current Facility-Administered Medications  Medication Dose Route Frequency Provider Last Rate Last Dose  . 0.9 %  sodium chloride infusion   Intravenous Continuous Loren Racer, MD 999 mL/hr at 08/08/12 1938 1,000 mL at 08/08/12 1938  . 0.9 %  sodium chloride infusion   Intravenous Continuous Lennette Bihari, MD 50 mL/hr at 08/11/12 1737    . acetaminophen (TYLENOL) tablet 650 mg  650 mg Oral Q4H PRN Lennette Bihari, MD      . aspirin EC tablet 81 mg  81 mg Oral Daily Lennette Bihari, MD   81 mg at 08/11/12 0948  . clopidogrel (PLAVIX) tablet 75 mg  75 mg Oral Q breakfast Lennette Bihari, MD   75 mg at 08/11/12 0948  . loperamide (IMODIUM) capsule 2 mg  2 mg Oral PRN Wilburt Finlay, PA   2 mg at 08/10/12 1218  . LORazepam (ATIVAN) tablet 1 mg  1 mg Oral Q6H PRN Nada Boozer, NP   1 mg at 08/12/12 1610  . metoprolol tartrate (LOPRESSOR) tablet 12.5 mg  12.5 mg Oral BID Nada Boozer, NP   12.5 mg at 08/11/12 2106  . nitroGLYCERIN (NITROSTAT) SL tablet 0.4 mg  0.4 mg Sublingual Q5 Min x 3 PRN Eda Paschal Kilroy, PA      . nitroGLYCERIN 0.2 mg/mL in dextrose 5 % infusion  2-200 mcg/min Intravenous Continuous Lennette Bihari, MD      . ondansetron [facility]) injection 4 mg  4 mg Intravenous Q6H PRN Lennette Bihari, MD      . simvastatin (ZOCOR) tablet 20 mg  20 mg Oral q1800 Eda Paschal [place], [place]   20 mg at 08/11/12 1703    PE: General appearance: alert, cooperative and no  distress Lungs: clear to auscultation bilaterally Heart: regular rate and rhythm, S1, S2 normal, no murmur, click, rub or gallop Extremities: No LEE Pulses: 2+ and symmetric Skin: WArm and dry Neurologic: Grossly normal  Lab Results:   Recent Labs  08/10/12 0600 08/10/12 1552  WBC 9.1 10.8*  HGB 10.2* 10.0*  HCT 30.5* 29.6*  PLT 207 238   BMET  Recent Labs  08/10/12 0600 08/11/12 0835 08/11/12 2037 08/12/12 0645  NA 140 142  --  144  K 3.0* 2.9* 3.9 3.8  CL 107 111  --  114*  CO2 19 19  --  17*  GLUCOSE 103* 144*  --  109*  BUN 26* 25*  --  19  CREATININE 1.40* 1.08  --  0.99  CALCIUM 8.6 8.4  --  8.4    Assessment/Plan   Principal Problem:   STEMI (ST elevation myocardial infarction), to RCA with PCI 08/08/12 Active Problems:   Pulmonary embolism, Jan 2012   DVT (deep venous thrombosis), 2011   HTN (hypertension)   Acute renal insufficiency   Spinal stenosis   CAD (coronary artery disease), residual 30% septal stenosis   Bradycardia, on admitand cath lab requiring temp  pacer for cath   Complete heart block, transient with STEMI  Plan:  S/P STEMI. POD #4.  PCI to RCA utilizing Asahi medium wire, 2.0 x 12 Sprinter, Expressway thrombectomy 5 runs, 2.5 x 15 Emerge, 2.75 x 32 BM Veriflex stent, and 2.75 Lillian Sprinter: 100% and prox to mid 60 - 70% stenosis to 0.  Temporary pacemaker inserted for heart block and P in 30's. BP and HR stable. ASA, plavix, lopressor, zocor.   BP elevated.  Will increase lopressor to 25mg  BID.   DC to rehab today.   LOS: 4 days    Irja Wheless 08/12/2012 9:22 AM

## 2012-08-12 NOTE — Clinical Social Work Note (Signed)
CSW confirmed SNF bed ready this morning at [facility]. Pt anxious to dc. CSW will fax over dc summary once ready for dc.   Frederico Hamman, LCSW [phone]

## 2012-08-12 NOTE — Evaluation (Addendum)
Physical Therapy Evaluation Patient Details Name: Cathy Wallace MRN: [id number] DOB: 1917/05/24 Today's Date: 08/12/2012 Time: 4098-1191 PT Time Calculation (min): 13 min  PT Assessment / Plan / Recommendation Clinical Impression  Pt s/p MI with PCI with decr mobility secondary to decr endurance and decr balance after procedure and bedrest.  Will benefit from PT to address endurance and balance.  Needs NHP with therapy.  Plan is to go today therefore no goals set in hospital.      PT Assessment  All further PT needs can be met in the next venue of care (Pt d/cing to SNF today; will not set in hospital goals)    Follow Up Recommendations  SNF;Supervision/Assistance - 24 hour                Equipment Recommendations  None recommended by PT               Precautions / Restrictions Precautions Precautions: Fall Restrictions Weight Bearing Restrictions: No   Pertinent Vitals/Pain VSS< No pain      Mobility  Bed Mobility Bed Mobility: Not assessed Transfers Transfers: Sit to Stand;Stand to Sit Sit to Stand: 4: Min assist;With upper extremity assist;With armrests;From chair/3-in-1 Stand to Sit: 4: Min assist;With upper extremity assist;With armrests;To chair/3-in-1 Details for Transfer Assistance: Needs steadying assist and cues for hand placement for sit to stand.  Pt with poor awareness for safety as well.   Ambulation/Gait Ambulation/Gait Assistance: 4: Min assist Ambulation Distance (Feet): 50 Feet Assistive device: None Ambulation/Gait Assistance Details: Pt unsteady ambulating with NT in room without device.  Pt needs to use RW but even with RW demonstrating unsafe behavior today.  Agree with Rehab prior to d/c home.  Gait Pattern: Trunk flexed;Step-to pattern;Shuffle;Decreased step length - right;Decreased step length - left Gait velocity: decreased General Gait Details: Pt grabbing furniture as she was ambulating around the room. Stairs: No Wheelchair  Mobility Wheelchair Mobility: No         PT Diagnosis: Generalized weakness  PT Problem List: Decreased activity tolerance;Decreased mobility;Decreased balance;Decreased knowledge of precautions;Decreased safety awareness;Decreased knowledge of use of DME PT Treatment Interventions: Gait training;DME instruction;Functional mobility training;Balance training;Therapeutic activities;Patient/family education   PT Goals  N/A secondary to d/c home  Visit Information  Last PT Received On: 08/12/12 Assistance Needed: +1    Subjective Data  Subjective: "I need to get out of here." Patient Stated Goal: Go to Rehab at [facility]   Prior Functioning  Home Living Lives With: Alone Available Help at Discharge: Other (Comment) (planning to go to [facility] for therapy) Type of Home: Independent living facility Home Access: Level entry Home Layout: One level Bathroom Shower/Tub: Tub/shower unit;Walk-in shower Bathroom Toilet: Standard Home Adaptive Equipment: Bedside commode/3-in-1;Grab bars around toilet;Grab bars in shower;Hand-held shower hose;Shower chair with back;Walker - four wheeled;Walker - rolling;Tub transfer bench Prior Function Level of Independence: Independent with assistive device(s) Able to Take Stairs?: No Driving: No Vocation: Retired Musician: No difficulties    Copywriter, advertising Overall Cognitive Status: Appears within functional limits for tasks assessed/performed Arousal/Alertness: Awake/alert Orientation Level: Appears intact for tasks assessed Behavior During Session: [facility] for tasks performed    Extremity/Trunk Assessment Right Lower Extremity Assessment RLE ROM/Strength/Tone: [facility] for tasks assessed Left Lower Extremity Assessment LLE ROM/Strength/Tone: [facility] for tasks assessed Trunk Assessment Trunk Assessment: Kyphotic   Balance Static Standing Balance Static Standing - Balance Support: During functional activity;No upper  extremity supported Static Standing - Level of Assistance: 4: Min assist Static Standing -  Comment/# of Minutes: needs steadying assist for static stance secondary to poor postural stability  End of Session PT - End of Session Equipment Utilized During Treatment: Gait belt Activity Tolerance: Patient tolerated treatment well Patient left: in chair;with call bell/phone within reach;with family/visitor present Nurse Communication: Mobility status       INGOLD,Marysue Fait 08/12/2012, 12:15 PM  [facility] [phone] (pager)

## 2012-08-12 NOTE — Progress Notes (Signed)
Noted pt has been accepted to [facility]. Will defer OT eval to [facility] since pt set to leave today.  Please reorder if pt is not d/c'd  For some reason. Tory Emerald, [place] [phone]

## 2012-11-10 ENCOUNTER — Inpatient Hospital Stay (HOSPITAL_COMMUNITY)
Admission: EM | Admit: 2012-11-10 | Discharge: 2012-11-13 | DRG: 536 | Disposition: A | Discharge status: Skilled Nursing Facility | Payer: Medicare Other | Attending: Internal Medicine | Admitting: Internal Medicine

## 2012-11-10 ENCOUNTER — Encounter (HOSPITAL_COMMUNITY): Payer: Self-pay | Admitting: Radiology

## 2012-11-10 ENCOUNTER — Emergency Department (HOSPITAL_COMMUNITY): Payer: Medicare Other

## 2012-11-10 DIAGNOSIS — Z86711 Personal history of pulmonary embolism: Secondary | ICD-10-CM

## 2012-11-10 DIAGNOSIS — S3210XA Unspecified fracture of sacrum, initial encounter for closed fracture: Secondary | ICD-10-CM | POA: Diagnosis present

## 2012-11-10 DIAGNOSIS — I82409 Acute embolism and thrombosis of unspecified deep veins of unspecified lower extremity: Secondary | ICD-10-CM | POA: Diagnosis present

## 2012-11-10 DIAGNOSIS — I2699 Other pulmonary embolism without acute cor pulmonale: Secondary | ICD-10-CM | POA: Diagnosis present

## 2012-11-10 DIAGNOSIS — Y9210 Unspecified residential institution as the place of occurrence of the external cause: Secondary | ICD-10-CM | POA: Diagnosis present

## 2012-11-10 DIAGNOSIS — M810 Age-related osteoporosis without current pathological fracture: Secondary | ICD-10-CM | POA: Diagnosis present

## 2012-11-10 DIAGNOSIS — K921 Melena: Secondary | ICD-10-CM | POA: Diagnosis present

## 2012-11-10 DIAGNOSIS — E739 Lactose intolerance, unspecified: Secondary | ICD-10-CM | POA: Diagnosis present

## 2012-11-10 DIAGNOSIS — D500 Iron deficiency anemia secondary to blood loss (chronic): Secondary | ICD-10-CM | POA: Diagnosis present

## 2012-11-10 DIAGNOSIS — I2510 Atherosclerotic heart disease of native coronary artery without angina pectoris: Secondary | ICD-10-CM

## 2012-11-10 DIAGNOSIS — Z86718 Personal history of other venous thrombosis and embolism: Secondary | ICD-10-CM

## 2012-11-10 DIAGNOSIS — S32591A Other specified fracture of right pubis, initial encounter for closed fracture: Secondary | ICD-10-CM

## 2012-11-10 DIAGNOSIS — M4800 Spinal stenosis, site unspecified: Secondary | ICD-10-CM | POA: Diagnosis present

## 2012-11-10 DIAGNOSIS — Z7901 Long term (current) use of anticoagulants: Secondary | ICD-10-CM

## 2012-11-10 DIAGNOSIS — S32009A Unspecified fracture of unspecified lumbar vertebra, initial encounter for closed fracture: Secondary | ICD-10-CM | POA: Diagnosis present

## 2012-11-10 DIAGNOSIS — E559 Vitamin D deficiency, unspecified: Secondary | ICD-10-CM | POA: Diagnosis present

## 2012-11-10 DIAGNOSIS — I10 Essential (primary) hypertension: Secondary | ICD-10-CM

## 2012-11-10 DIAGNOSIS — S32509A Unspecified fracture of unspecified pubis, initial encounter for closed fracture: Principal | ICD-10-CM | POA: Diagnosis present

## 2012-11-10 DIAGNOSIS — E876 Hypokalemia: Secondary | ICD-10-CM | POA: Diagnosis not present

## 2012-11-10 DIAGNOSIS — D649 Anemia, unspecified: Secondary | ICD-10-CM

## 2012-11-10 DIAGNOSIS — K922 Gastrointestinal hemorrhage, unspecified: Secondary | ICD-10-CM

## 2012-11-10 DIAGNOSIS — Z79899 Other long term (current) drug therapy: Secondary | ICD-10-CM

## 2012-11-10 DIAGNOSIS — W19XXXA Unspecified fall, initial encounter: Secondary | ICD-10-CM | POA: Diagnosis present

## 2012-11-10 DIAGNOSIS — K219 Gastro-esophageal reflux disease without esophagitis: Secondary | ICD-10-CM | POA: Diagnosis present

## 2012-11-10 DIAGNOSIS — Z66 Do not resuscitate: Secondary | ICD-10-CM | POA: Diagnosis present

## 2012-11-10 DIAGNOSIS — S329XXA Fracture of unspecified parts of lumbosacral spine and pelvis, initial encounter for closed fracture: Secondary | ICD-10-CM

## 2012-11-10 HISTORY — DX: Depression, unspecified: F32.A

## 2012-11-10 HISTORY — DX: Unspecified osteoarthritis, unspecified site: M19.90

## 2012-11-10 HISTORY — DX: Malignant (primary) neoplasm, unspecified: C80.1

## 2012-11-10 HISTORY — DX: Presence of cardiac pacemaker: Z95.0

## 2012-11-10 HISTORY — DX: Gastro-esophageal reflux disease without esophagitis: K21.9

## 2012-11-10 HISTORY — DX: Anxiety disorder, unspecified: F41.9

## 2012-11-10 HISTORY — DX: Major depressive disorder, single episode, unspecified: F32.9

## 2012-11-10 LAB — URINALYSIS, ROUTINE W REFLEX MICROSCOPIC
Hgb urine dipstick: NEGATIVE
Nitrite: NEGATIVE
Protein, ur: NEGATIVE mg/dL
Specific Gravity, Urine: 1.025 (ref 1.005–1.030)
Urobilinogen, UA: 0.2 mg/dL (ref 0.0–1.0)

## 2012-11-10 LAB — POCT I-STAT, CHEM 8
Calcium, Ion: 1.18 mmol/L (ref 1.13–1.30)
Creatinine, Ser: 1 mg/dL (ref 0.50–1.10)
Glucose, Bld: 123 mg/dL — ABNORMAL HIGH (ref 70–99)
HCT: 28 % — ABNORMAL LOW (ref 36.0–46.0)
Hemoglobin: 9.5 g/dL — ABNORMAL LOW (ref 12.0–15.0)
Potassium: 3.3 mEq/L — ABNORMAL LOW (ref 3.5–5.1)
TCO2: 24 mmol/L (ref 0–100)

## 2012-11-10 LAB — CBC WITH DIFFERENTIAL/PLATELET
Basophils Relative: 0 % (ref 0–1)
HCT: 27.8 % — ABNORMAL LOW (ref 36.0–46.0)
Hemoglobin: 8.6 g/dL — ABNORMAL LOW (ref 12.0–15.0)
Lymphocytes Relative: 14 % (ref 12–46)
MCHC: 30.9 g/dL (ref 30.0–36.0)
MCV: 78.3 fL (ref 78.0–100.0)
Monocytes Absolute: 0.9 10*3/uL (ref 0.1–1.0)
Monocytes Relative: 8 % (ref 3–12)
Neutro Abs: 8.9 10*3/uL — ABNORMAL HIGH (ref 1.7–7.7)

## 2012-11-10 LAB — PROTIME-INR
INR: 3.17 — ABNORMAL HIGH (ref 0.00–1.49)
Prothrombin Time: 30.8 seconds — ABNORMAL HIGH (ref 11.6–15.2)

## 2012-11-10 MED ORDER — ONDANSETRON HCL 4 MG/2ML IJ SOLN
4.0000 mg | Freq: Once | INTRAMUSCULAR | Status: AC
  Administered 2012-11-10: 4 mg via INTRAVENOUS
  Filled 2012-11-10: qty 2

## 2012-11-10 MED ORDER — FENTANYL CITRATE 0.05 MG/ML IJ SOLN
50.0000 ug | Freq: Once | INTRAMUSCULAR | Status: AC
  Administered 2012-11-10: 50 ug via INTRAVENOUS
  Filled 2012-11-10: qty 2

## 2012-11-10 NOTE — ED Provider Notes (Signed)
Patient is a 79 y/o female who presents from [facility] with right groin pain with inability to bear weight on her R leg. Patient fell about 4 weeks ago and had another fall about 2 weeks ago. CT pelvis significant for displaced fractures of the right superior and inferior pubic rami as well as fat stranding within the retropubic space - ? blood secondary to the rami fractures. Labs significant for drop in Hgb to 8.6 from baseline of ~10.5. Will consult orthopedics as well as hospitalist for admission.   Has spoken to Dr. Carola Frost regarding the patient. Dr. Carola Frost to see in AM for further evaluation. Consult to internal medicine placed for admission.  Dr. Ellis Savage to admit.   Results for orders placed during the hospital encounter of 11/10/12  CBC WITH DIFFERENTIAL      Result Value Range   WBC 11.3 (*) 4.0 - 10.5 K/uL   RBC 3.55 (*) 3.87 - 5.11 MIL/uL   Hemoglobin 8.6 (*) 12.0 - 15.0 g/dL   HCT 16.1 (*) 09.6 - 04.5 %   MCV 78.3  78.0 - 100.0 fL   MCH 24.2 (*) 26.0 - 34.0 pg   MCHC 30.9  30.0 - 36.0 g/dL   RDW 40.9 (*) 81.1 - 91.4 %   Platelets 472 (*) 150 - 400 K/uL   Neutrophils Relative % 78 (*) 43 - 77 %   Neutro Abs 8.9 (*) 1.7 - 7.7 K/uL   Lymphocytes Relative 14  12 - 46 %   Lymphs Abs 1.6  0.7 - 4.0 K/uL   Monocytes Relative 8  3 - 12 %   Monocytes Absolute 0.9  0.1 - 1.0 K/uL   Eosinophils Relative 0  0 - 5 %   Eosinophils Absolute 0.0  0.0 - 0.7 K/uL   Basophils Relative 0  0 - 1 %   Basophils Absolute 0.0  0.0 - 0.1 K/uL  PROTIME-INR      Result Value Range   Prothrombin Time 30.8 (*) 11.6 - 15.2 seconds   INR 3.17 (*) 0.00 - 1.49  URINALYSIS, ROUTINE W REFLEX MICROSCOPIC      Result Value Range   Color, Urine YELLOW  YELLOW   APPearance CLEAR  CLEAR   Specific Gravity, Urine 1.025  1.005 - 1.030   pH 5.0  5.0 - 8.0   Glucose, UA NEGATIVE  NEGATIVE mg/dL   Hgb urine dipstick NEGATIVE  NEGATIVE   Bilirubin Urine SMALL (*) NEGATIVE   Ketones, ur 15  (*) NEGATIVE mg/dL   Protein, ur NEGATIVE  NEGATIVE mg/dL   Urobilinogen, UA 0.2  0.0 - 1.0 mg/dL   Nitrite NEGATIVE  NEGATIVE   Leukocytes, UA NEGATIVE  NEGATIVE  POCT I-STAT, CHEM 8      Result Value Range   Sodium 139  135 - 145 mEq/L   Potassium 3.3 (*) 3.5 - 5.1 mEq/L   Chloride 104  96 - 112 mEq/L   BUN 20  6 - 23 mg/dL   Creatinine, Ser 7.82  0.50 - 1.10 mg/dL   Glucose, Bld 956 (*) 70 - 99 mg/dL   Calcium, Ion 2.13  0.86 - 1.30 mmol/L   TCO2 24  0 - 100 mmol/L   Hemoglobin 9.5 (*) 12.0 - 15.0 g/dL   HCT 57.8 (*) 46.9 - 62.9 %   Dg Lumbar Spine Complete  11/10/2012  *RADIOLOGY REPORT*  Clinical Data: Groin pain.  LUMBAR SPINE - COMPLETE 4+ VIEW  Comparison: None.  Findings: There  is a transitional anatomy at the lumbosacral junction.  Severe compression fracture at L1 with moderate compression fractures at L3 and L4.  The L1 compression fracture appears chronic.  The L3 and L4 compression fractures are age indeterminate.  Diffuse osteopenia.  IMPRESSION: Old severe L1 compression fracture.  Age indeterminate moderate compression fractures at L3 and L4.   Original Report Authenticated By: Charlett Nose, M.D.    Dg Hip Complete Right  11/10/2012  *RADIOLOGY REPORT*  Clinical Data: Fall, groin pain.  RIGHT HIP - COMPLETE 2+ VIEW  Comparison: None.  Findings: There are fractures through the right superior and inferior pubic rami, slightly displaced.  Diffuse bone demineralization.  No proximal femoral abnormality.  Joint spaces are symmetric within the hip and SI joints.  IMPRESSION: Right superior and inferior pubic rami fractures.   Original Report Authenticated By: Charlett Nose, M.D.    Ct Pelvis Wo Contrast  11/10/2012  *RADIOLOGY REPORT*  Clinical Data: Fall, rami fractures  CT PELVIS WITHOUT CONTRAST  Technique:  Multidetector CT imaging of the pelvis was performed following the standard protocol without intravenous contrast.  Comparison: 11/10/2012 radiograph  Findings: Multiple  images are degraded by motion.  No atherosclerotic disease of the aorta and branch vessels.  There is a fat stranding within the retropubic space, abutting the anterior margin of the bladder.  Diffuse osteopenia.  Bilateral sacral ala fractures are nondisplaced.  Fracture of the L5 left transverse process.  Displaced fractures of the right parasymphyseal superior pubic ramus and inferior pubic ramus.  The femoral heads remains seated within the acetabula.  No displaced femur fracture is identified.  IMPRESSION: Displaced fractures of the right superior and inferior pubic rami.  Fat stranding within the retropubic space may reflect blood secondary to the rami fractures.  A bladder injury cannot be excluded in the appropriate clinical setting.  Bilateral nondisplaced sacral fractures.  No femoral fracture identified.  If clinical concern persists, MRI has increased sensitivity.  Discussed via telephone with Dr. Rulon Abide at 10:58 p.m. on 11/10/2012.   Original Report Authenticated By: Jearld Lesch, M.D.       Antony Madura, PA-C 11/11/12 4098

## 2012-11-10 NOTE — ED Provider Notes (Signed)
History     CSN: [id number]  Arrival date & time 11/10/12  1807   First MD Initiated Contact with Patient 11/10/12 1816      Chief Complaint  Patient presents with  . Groin Pain    (Consider location/radiation/quality/duration/timing/severity/associated sxs/prior treatment) HPI Comments: Patient presents with right groin pain. She lives [facility]. She states she fell about 4 weeks ago and had another fall about 2 weeks ago. She states she fell onto her back. She's been complaining of some pain to her lower back but more recently has increased pain to her right groin. She states they did x-rays of her back and her hip about 3 weeks ago. She states there is no evidence of fracture at that time. She's having increasing difficulty bearing weight on her right leg. She also was seen by her primary care provider today who noted that her hemoglobin was lower than normal. She denies any other pain complaints other than her right hip. She says it's worse with bearing weight and movement the right leg. She has some mild pain to her low back but denies any neck or other back pain. She denies any other injuries from the fall. Of note she is on Coumadin.  Patient is a 79 y.o. female presenting with groin pain.  Groin Pain Pertinent negatives include no chest pain, no abdominal pain, no headaches and no shortness of breath.    Past Medical History  Diagnosis Date  . DVT (deep venous thrombosis) 2011  . Pulmonary embolism Jan 2012  . HTN (hypertension)   . Spinal stenosis   . Bradycardia, on admit 08/09/2012    No past surgical history on file.  No family history on file.  History  Substance Use Topics  . Smoking status: Never Smoker   . Smokeless tobacco: Not on file  . Alcohol Use: Not on file    OB History   Grav Para Term Preterm Abortions TAB SAB Ect Mult Living                  Review of Systems  Constitutional: Negative for fever, chills, diaphoresis and  fatigue.  HENT: Negative for congestion, rhinorrhea and sneezing.   Eyes: Negative.   Respiratory: Negative for cough, chest tightness and shortness of breath.   Cardiovascular: Negative for chest pain and leg swelling.  Gastrointestinal: Negative for nausea, vomiting, abdominal pain, diarrhea and blood in stool.  Genitourinary: Negative for frequency, hematuria, flank pain and difficulty urinating.  Musculoskeletal: Positive for back pain and arthralgias.  Skin: Negative for rash.  Neurological: Negative for dizziness, speech difficulty, weakness, numbness and headaches.    Allergies  Aspirin; Benadryl; Codeine; and Penicillins  Home Medications   Current Outpatient Rx  Name  Route  Sig  Dispense  Refill  . butalbital-acetaminophen-caffeine (FIORICET, ESGIC) 50-325-40 MG per tablet   Oral   Take 1 tablet by mouth 2 (two) times daily as needed for pain or headache.         . diltiazem (CARDIZEM CD) 180 MG 24 hr capsule   Oral   Take 180 mg by mouth daily.         . fluticasone (FLONASE) 50 MCG/ACT nasal spray   Nasal   Place 2 sprays into the nose daily.         Marland Kitchen loratadine (CLARITIN) 10 MG tablet   Oral   Take 10 mg by mouth daily as needed for allergies.          Marland Kitchen  LORazepam (ATIVAN) 1 MG tablet   Oral   Take 1-2 mg by mouth See admin instructions. Take 1 tablet in the morning as needed and 2 at bedtime         . meloxicam (MOBIC) 15 MG tablet   Oral   Take 15 mg by mouth daily.         . metoprolol tartrate (LOPRESSOR) 25 MG tablet   Oral   Take 12.5 mg by mouth 2 (two) times daily.         . sertraline (ZOLOFT) 25 MG tablet   Oral   Take 25 mg by mouth daily.         Marland Kitchen triamterene-hydrochlorothiazide (DYAZIDE) 37.5-25 MG per capsule   Oral   Take 1 capsule by mouth daily.         Marland Kitchen warfarin (COUMADIN) 2.5 MG tablet   Oral   Take 1.25 mg by mouth daily.         . nitroGLYCERIN (NITROSTAT) 0.4 MG SL tablet   Sublingual   Place 1  tablet (0.4 mg total) under the tongue every 5 (five) minutes x 3 doses as needed for chest pain.   25 tablet   12     BP 136/86  Pulse 60  Temp(Src) 97.3 F (36.3 C) (Oral)  Resp 19  SpO2 97%  Physical Exam  Constitutional: She is oriented to person, place, and time. She appears well-developed and well-nourished.  HENT:  Head: Normocephalic and atraumatic.  Mouth/Throat: Oropharynx is clear and moist.  Eyes: Pupils are equal, round, and reactive to light.  Neck: Normal range of motion. Neck supple.  No pain to the cervical or thoracic spine. She has some moderate tenderness to the mid and lower lumbosacral spine. No step-offs or deformities are noted.  Cardiovascular: Normal rate, regular rhythm and normal heart sounds.   Pulmonary/Chest: Effort normal and breath sounds normal. No respiratory distress. She has no wheezes. She has no rales. She exhibits no tenderness.  Abdominal: Soft. Bowel sounds are normal. There is no tenderness. There is no rebound and no guarding.  Musculoskeletal: Normal range of motion. She exhibits no edema.  Patient has some mild tenderness on palpation of the pubic symphysis. She has pain on range of motion of the right hip. There is no pain to the knee or ankle. Distal pulses are intact.  Lymphadenopathy:    She has no cervical adenopathy.  Neurological: She is alert and oriented to person, place, and time.  Skin: Skin is warm and dry. No rash noted.  Psychiatric: She has a normal mood and affect.    ED Course  Procedures (including critical care time)  Results for orders placed during the hospital encounter of 11/10/12  CBC WITH DIFFERENTIAL      Result Value Range   WBC 11.3 (*) 4.0 - 10.5 K/uL   RBC 3.55 (*) 3.87 - 5.11 MIL/uL   Hemoglobin 8.6 (*) 12.0 - 15.0 g/dL   HCT 82.9 (*) 56.2 - 13.0 %   MCV 78.3  78.0 - 100.0 fL   MCH 24.2 (*) 26.0 - 34.0 pg   MCHC 30.9  30.0 - 36.0 g/dL   RDW 86.5 (*) 78.4 - 69.6 %   Platelets 472 (*) 150 - 400  K/uL   Neutrophils Relative % 78 (*) 43 - 77 %   Neutro Abs 8.9 (*) 1.7 - 7.7 K/uL   Lymphocytes Relative 14  12 - 46 %   Lymphs Abs 1.6  0.7 - 4.0 K/uL   Monocytes Relative 8  3 - 12 %   Monocytes Absolute 0.9  0.1 - 1.0 K/uL   Eosinophils Relative 0  0 - 5 %   Eosinophils Absolute 0.0  0.0 - 0.7 K/uL   Basophils Relative 0  0 - 1 %   Basophils Absolute 0.0  0.0 - 0.1 K/uL  PROTIME-INR      Result Value Range   Prothrombin Time 30.8 (*) 11.6 - 15.2 seconds   INR 3.17 (*) 0.00 - 1.49  POCT I-STAT, CHEM 8      Result Value Range   Sodium 139  135 - 145 mEq/L   Potassium 3.3 (*) 3.5 - 5.1 mEq/L   Chloride 104  96 - 112 mEq/L   BUN 20  6 - 23 mg/dL   Creatinine, Ser 1.61  0.50 - 1.10 mg/dL   Glucose, Bld 096 (*) 70 - 99 mg/dL   Calcium, Ion 0.45  4.09 - 1.30 mmol/L   TCO2 24  0 - 100 mmol/L   Hemoglobin 9.5 (*) 12.0 - 15.0 g/dL   HCT 81.1 (*) 91.4 - 78.2 %   No results found.   Date: 11/10/2012  Rate: 58  Rhythm: normal sinus rhythm  QRS Axis: normal  Intervals: normal  ST/T Wave abnormalities: nonspecific ST/T changes  Conduction Disutrbances:none  Narrative Interpretation:   Old EKG Reviewed: none available, similar to EKG in Feb   No results found.    No diagnosis found.    MDM  Patient with right hip pain and anemia. Her x-rays show pubic rami fractures. However given her drop in hemoglobin and inability to bear weight on the right side I will go ahead and do a CT scan of the hip to rule out an occult fracture. I anticipate that she will need to be admitted regardless due to her drop in hemoglobin. I will turn over in his the patient to the oncoming mid-level provider pending results of the CT scan.        Rolan Bucco, MD 11/10/12 2100

## 2012-11-10 NOTE — ED Notes (Signed)
MD at bedside. 

## 2012-11-10 NOTE — ED Notes (Signed)
Patient being transported to Xray via stretcher

## 2012-11-10 NOTE — ED Notes (Addendum)
Per EMS pt from [facility]. Pt fell 2-3 weeks ago, dx home. Three days ago pt started to have right groin/hip pain. Denies fall/ injury to cause new pain. Seen today by PCP, x ray of hip was performed which was negative but pt slightly anemic so sent here for further tx. Pt hasn't been able to walk since yesterday due to pain. AO x4. VSS.

## 2012-11-11 ENCOUNTER — Encounter (HOSPITAL_COMMUNITY): Payer: Self-pay | Admitting: General Practice

## 2012-11-11 ENCOUNTER — Inpatient Hospital Stay (HOSPITAL_COMMUNITY): Payer: Medicare Other

## 2012-11-11 DIAGNOSIS — S329XXA Fracture of unspecified parts of lumbosacral spine and pelvis, initial encounter for closed fracture: Secondary | ICD-10-CM

## 2012-11-11 DIAGNOSIS — S32509A Unspecified fracture of unspecified pubis, initial encounter for closed fracture: Principal | ICD-10-CM

## 2012-11-11 DIAGNOSIS — K922 Gastrointestinal hemorrhage, unspecified: Secondary | ICD-10-CM

## 2012-11-11 DIAGNOSIS — D649 Anemia, unspecified: Secondary | ICD-10-CM | POA: Diagnosis present

## 2012-11-11 DIAGNOSIS — I2510 Atherosclerotic heart disease of native coronary artery without angina pectoris: Secondary | ICD-10-CM

## 2012-11-11 HISTORY — DX: Fracture of unspecified parts of lumbosacral spine and pelvis, initial encounter for closed fracture: S32.9XXA

## 2012-11-11 LAB — CBC
HCT: 26.9 % — ABNORMAL LOW (ref 36.0–46.0)
HCT: 27.5 % — ABNORMAL LOW (ref 36.0–46.0)
HCT: 28 % — ABNORMAL LOW (ref 36.0–46.0)
Hemoglobin: 8.4 g/dL — ABNORMAL LOW (ref 12.0–15.0)
Hemoglobin: 8.8 g/dL — ABNORMAL LOW (ref 12.0–15.0)
MCH: 24.6 pg — ABNORMAL LOW (ref 26.0–34.0)
MCH: 24.6 pg — ABNORMAL LOW (ref 26.0–34.0)
MCH: 24.8 pg — ABNORMAL LOW (ref 26.0–34.0)
MCHC: 31.2 g/dL (ref 30.0–36.0)
MCHC: 31.4 g/dL (ref 30.0–36.0)
MCV: 78.8 fL (ref 78.0–100.0)
RBC: 3.42 MIL/uL — ABNORMAL LOW (ref 3.87–5.11)
RDW: 18.2 % — ABNORMAL HIGH (ref 11.5–15.5)
RDW: 18.4 % — ABNORMAL HIGH (ref 11.5–15.5)
WBC: 13.2 10*3/uL — ABNORMAL HIGH (ref 4.0–10.5)

## 2012-11-11 LAB — ABO/RH: ABO/RH(D): O POS

## 2012-11-11 MED ORDER — FOLIC ACID 1 MG PO TABS
1.0000 mg | ORAL_TABLET | Freq: Every day | ORAL | Status: DC
  Administered 2012-11-11 – 2012-11-13 (×3): 1 mg via ORAL
  Filled 2012-11-11 (×3): qty 1

## 2012-11-11 MED ORDER — SERTRALINE HCL 25 MG PO TABS
25.0000 mg | ORAL_TABLET | Freq: Every day | ORAL | Status: DC
  Administered 2012-11-11 – 2012-11-13 (×3): 25 mg via ORAL
  Filled 2012-11-11 (×3): qty 1

## 2012-11-11 MED ORDER — ONDANSETRON HCL 4 MG PO TABS: 4.0000 mg | ORAL_TABLET | Freq: Four times a day (QID) | ORAL | Status: DC | PRN

## 2012-11-11 MED ORDER — SODIUM CHLORIDE 0.9 % IJ SOLN
3.0000 mL | Freq: Two times a day (BID) | INTRAMUSCULAR | Status: DC
  Administered 2012-11-11 – 2012-11-12 (×3): 3 mL via INTRAVENOUS

## 2012-11-11 MED ORDER — LORAZEPAM 1 MG PO TABS: 1.0000 mg | ORAL_TABLET | ORAL | Status: DC

## 2012-11-11 MED ORDER — ONDANSETRON HCL 4 MG/2ML IJ SOLN: 4.0000 mg | Freq: Four times a day (QID) | INTRAMUSCULAR | Status: DC | PRN

## 2012-11-11 MED ORDER — NITROGLYCERIN 0.4 MG SL SUBL: 0.4000 mg | SUBLINGUAL_TABLET | SUBLINGUAL | Status: DC | PRN

## 2012-11-11 MED ORDER — ACETAMINOPHEN 325 MG PO TABS: 325.0000 mg | ORAL_TABLET | Freq: Four times a day (QID) | ORAL | Status: DC | PRN

## 2012-11-11 MED ORDER — PANTOPRAZOLE SODIUM 40 MG PO TBEC
40.0000 mg | DELAYED_RELEASE_TABLET | Freq: Every day | ORAL | Status: DC
  Administered 2012-11-11 – 2012-11-13 (×3): 40 mg via ORAL
  Filled 2012-11-11 (×2): qty 1

## 2012-11-11 MED ORDER — FLUTICASONE PROPIONATE 50 MCG/ACT NA SUSP
2.0000 | Freq: Every day | NASAL | Status: DC
  Administered 2012-11-11 – 2012-11-12 (×2): 2 via NASAL
  Filled 2012-11-11: qty 16

## 2012-11-11 MED ORDER — LORATADINE 10 MG PO TABS
10.0000 mg | ORAL_TABLET | Freq: Every day | ORAL | Status: DC | PRN
  Filled 2012-11-11: qty 1

## 2012-11-11 MED ORDER — VITAMIN B-1 100 MG PO TABS
100.0000 mg | ORAL_TABLET | Freq: Every day | ORAL | Status: DC
  Administered 2012-11-11 – 2012-11-13 (×3): 100 mg via ORAL
  Filled 2012-11-11 (×3): qty 1

## 2012-11-11 MED ORDER — METOPROLOL TARTRATE 12.5 MG HALF TABLET
12.5000 mg | ORAL_TABLET | Freq: Two times a day (BID) | ORAL | Status: DC
  Administered 2012-11-11 – 2012-11-13 (×4): 12.5 mg via ORAL
  Filled 2012-11-11 (×6): qty 1

## 2012-11-11 MED ORDER — LORAZEPAM 1 MG PO TABS
1.0000 mg | ORAL_TABLET | Freq: Four times a day (QID) | ORAL | Status: DC | PRN
  Administered 2012-11-12: 1 mg via ORAL
  Filled 2012-11-11: qty 1

## 2012-11-11 MED ORDER — THIAMINE HCL 100 MG/ML IJ SOLN
100.0000 mg | Freq: Every day | INTRAMUSCULAR | Status: DC
  Filled 2012-11-11 (×3): qty 1

## 2012-11-11 MED ORDER — LORAZEPAM 2 MG/ML IJ SOLN
1.0000 mg | Freq: Four times a day (QID) | INTRAMUSCULAR | Status: DC | PRN
  Administered 2012-11-11: 22:00:00 via INTRAVENOUS
  Filled 2012-11-11: qty 1

## 2012-11-11 MED ORDER — SODIUM CHLORIDE 0.9 % IJ SOLN: 3.0000 mL | INTRAMUSCULAR | Status: DC | PRN

## 2012-11-11 MED ORDER — LORAZEPAM 2 MG/ML IJ SOLN
0.0000 mg | Freq: Four times a day (QID) | INTRAMUSCULAR | Status: AC
  Administered 2012-11-12: 1 mg via INTRAVENOUS
  Filled 2012-11-11: qty 1

## 2012-11-11 MED ORDER — MORPHINE SULFATE 2 MG/ML IJ SOLN
1.0000 mg | INTRAMUSCULAR | Status: DC | PRN
  Administered 2012-11-11: 1 mg via INTRAVENOUS
  Filled 2012-11-11: qty 1

## 2012-11-11 MED ORDER — LORAZEPAM 2 MG/ML IJ SOLN: 0.0000 mg | Freq: Two times a day (BID) | INTRAMUSCULAR | Status: DC

## 2012-11-11 MED ORDER — SODIUM CHLORIDE 0.9 % IV SOLN: 250.0000 mL | INTRAVENOUS | Status: DC | PRN

## 2012-11-11 MED ORDER — MORPHINE SULFATE 2 MG/ML IJ SOLN: 2.0000 mg | INTRAMUSCULAR | Status: DC | PRN

## 2012-11-11 MED ORDER — TRIAMTERENE-HCTZ 37.5-25 MG PO CAPS
1.0000 | ORAL_CAPSULE | Freq: Every day | ORAL | Status: DC
  Filled 2012-11-11: qty 1

## 2012-11-11 MED ORDER — TRIAMTERENE-HCTZ 37.5-25 MG PO TABS
1.0000 | ORAL_TABLET | Freq: Every day | ORAL | Status: DC
  Administered 2012-11-11 – 2012-11-13 (×3): 1 via ORAL
  Filled 2012-11-11 (×3): qty 1

## 2012-11-11 MED ORDER — DILTIAZEM HCL ER COATED BEADS 180 MG PO CP24
180.0000 mg | ORAL_CAPSULE | Freq: Every day | ORAL | Status: DC
  Administered 2012-11-11 – 2012-11-13 (×3): 180 mg via ORAL
  Filled 2012-11-11 (×3): qty 1

## 2012-11-11 MED ORDER — HYDROCODONE-ACETAMINOPHEN 5-325 MG PO TABS
1.0000 | ORAL_TABLET | Freq: Four times a day (QID) | ORAL | Status: DC | PRN
  Administered 2012-11-11: 1 via ORAL
  Administered 2012-11-11 – 2012-11-12 (×2): 2 via ORAL
  Administered 2012-11-12: 1 via ORAL
  Filled 2012-11-11 (×2): qty 1
  Filled 2012-11-11 (×2): qty 2

## 2012-11-11 MED ORDER — SODIUM CHLORIDE 0.9 % IJ SOLN
3.0000 mL | Freq: Two times a day (BID) | INTRAMUSCULAR | Status: DC
  Administered 2012-11-11: 3 mL via INTRAVENOUS

## 2012-11-11 MED ORDER — ADULT MULTIVITAMIN W/MINERALS CH
1.0000 | ORAL_TABLET | Freq: Every day | ORAL | Status: DC
  Administered 2012-11-11 – 2012-11-13 (×3): 1 via ORAL
  Filled 2012-11-11 (×3): qty 1

## 2012-11-11 MED ORDER — DOCUSATE SODIUM 100 MG PO CAPS
100.0000 mg | ORAL_CAPSULE | Freq: Two times a day (BID) | ORAL | Status: DC
  Administered 2012-11-11 – 2012-11-13 (×5): 100 mg via ORAL
  Filled 2012-11-11 (×6): qty 1

## 2012-11-11 NOTE — H&P (Signed)
[facility] History and Physical  Cathy Wallace ZOX:[id number] DOB: [birth year]    PCP:   Pearla Dubonnet, MD   Chief Complaint: pain in her hips unable to walk.  HPI: Cathy Wallace is an 79 y.o. female presents to ER from her [facility] home complaining of pain in her right hip for the past 4 weeks.  She has hx of DVT, and PE on chronic anticoagulation with coumadin, along with HTN, spinal stenosis which are all stable.  She fell about 4 weeks ago, had pain in her hips, but it improved.  She fell again 2 weeks ago, and had progressive increased in pain to the point where she can no longer walk.  In the ER, evaluation included a pelvic CT which showed displaced sup and inf ramus Fx, along with bilateral nondisplaced sacral Fx, Left L5 transverse process Fx.  She was incidentally found to have a Hb of 8.6 grams where her usual Hb is 10grams/DL.  There were some stranding in the pelvic CT suggestive of some retroperitoneal bleed.  This could be bladder disruption, but she has no GU complaints.  She did say that she had one bout of melanotic stool without any abdominal pain.  She denied using any ASA, but has been on Mobic along with Coumadin.  She has had hx of guaic positive stool.  Rewiew of Systems:  Constitutional: Negative for malaise, fever and chills. No significant weight loss or weight gain Eyes: Negative for eye pain, redness and discharge, diplopia, visual changes, or flashes of light. ENMT: Negative for ear pain, hoarseness, nasal congestion, sinus pressure and sore throat. No headaches; tinnitus, drooling, or problem swallowing. Cardiovascular: Negative for chest pain, palpitations, diaphoresis, dyspnea and peripheral edema. ; No orthopnea, PND Respiratory: Negative for cough, hemoptysis, wheezing and stridor. No pleuritic chestpain. Gastrointestinal: Negative for nausea, vomiting, diarrhea, constipation, abdominal pain, blood in stool, hematemesis, jaundice and rectal  bleeding.    Genitourinary: Negative for frequency, dysuria, incontinence,flank pain and hematuria; Musculoskeletal: Negative for back pain and neck pain. Negative for swelling and trauma.;  Skin: . Negative for pruritus, rash, abrasions, bruising and skin lesion.; ulcerations Neuro: Negative for headache, lightheadedness and neck stiffness. Negative for weakness, altered level of consciousness , altered mental status, extremity weakness, burning feet, involuntary movement, seizure and syncope.  Psych: negative for anxiety, depression, insomnia, tearfulness, panic attacks, hallucinations, paranoia, suicidal or homicidal ideation    Past Medical History  Diagnosis Date  . DVT (deep venous thrombosis) 2011  . Pulmonary embolism Jan 2012  . HTN (hypertension)   . Spinal stenosis   . Bradycardia, on admit 08/09/2012    No past surgical history on file.  Medications:  HOME MEDS: Prior to Admission medications   Medication Sig Start Date End Date Taking? Authorizing Provider  butalbital-acetaminophen-caffeine (FIORICET, ESGIC) 50-325-40 MG per tablet Take 1 tablet by mouth 2 (two) times daily as needed for pain or headache.   Yes Historical Provider, MD  diltiazem (CARDIZEM CD) 180 MG 24 hr capsule Take 180 mg by mouth daily.   Yes Historical Provider, MD  fluticasone (FLONASE) 50 MCG/ACT nasal spray Place 2 sprays into the nose daily.   Yes Historical Provider, MD  loratadine (CLARITIN) 10 MG tablet Take 10 mg by mouth daily as needed for allergies.    Yes Historical Provider, MD  LORazepam (ATIVAN) 1 MG tablet Take 1-2 mg by mouth See admin instructions. Take 1 tablet in the morning as needed and 2 at bedtime   Yes  Historical Provider, MD  meloxicam (MOBIC) 15 MG tablet Take 15 mg by mouth daily.   Yes Historical Provider, MD  metoprolol tartrate (LOPRESSOR) 25 MG tablet Take 12.5 mg by mouth 2 (two) times daily.   Yes Historical Provider, MD  sertraline (ZOLOFT) 25 MG tablet Take 25 mg by  mouth daily.   Yes Historical Provider, MD  triamterene-hydrochlorothiazide (DYAZIDE) 37.5-25 MG per capsule Take 1 capsule by mouth daily.   Yes Historical Provider, MD  warfarin (COUMADIN) 2.5 MG tablet Take 1.25 mg by mouth daily.   Yes Historical Provider, MD  nitroGLYCERIN (NITROSTAT) 0.4 MG SL tablet Place 1 tablet (0.4 mg total) under the tongue every 5 (five) minutes x 3 doses as needed for chest pain. 08/12/12   Wilburt Finlay, PA-C     Allergies:  Allergies  Allergen Reactions  . Aspirin   . Benadryl (Diphenhydramine Hcl)   . Codeine Nausea And Vomiting  . Penicillins Rash    Social History:   reports that she has never smoked. She does not have any smokeless tobacco history on file. Her alcohol and drug histories are not on file.  Family History: No family history on file.   Physical Exam: Filed Vitals:   11/10/12 1915 11/10/12 1930 11/10/12 1945 11/11/12 0215  BP: 141/57 136/86  153/62  Pulse: 55 54 60 61  Temp:    98.3 F (36.8 C)  TempSrc:    Oral  Resp: 15 19 19 20   Height:    5\' 3"  (1.6 m)  Weight:    51.71 kg (114 lb)  SpO2: 98% 100% 97% 94%   Blood pressure 153/62, pulse 61, temperature 98.3 F (36.8 C), temperature source Oral, resp. rate 20, height 5\' 3"  (1.6 m), weight 51.71 kg (114 lb), SpO2 94.00%.  GEN:  Pleasant  patient lying in the stretcher in no acute distress; cooperative with exam. PSYCH:  alert and oriented x4; does not appear anxious or depressed; affect is appropriate. HEENT: Mucous membranes pink and anicteric; PERRLA; EOM intact; no cervical lymphadenopathy nor thyromegaly or carotid bruit; no JVD; There were no stridor. Neck is very supple. Breasts:: Not examined CHEST WALL: No tenderness CHEST: Normal respiration, clear to auscultation bilaterally.  HEART: Regular rate and rhythm.  There are no murmur, rub, or gallops.   BACK: No kyphosis or scoliosis; no CVA tenderness ABDOMEN: soft and non-tender; no masses, no organomegaly, normal  abdominal bowel sounds; no pannus; no intertriginous candida. There is no rebound and no distention. Rectal Exam: Not done EXTREMITIES: No bone or joint deformity; age-appropriate arthropathy of the hands and knees; no edema; no ulcerations.  There is no calf tenderness. She has pain in the right hip with any movement of her right leg.  Neurovasc exams are intact. Genitalia: not examined PULSES: 2+ and symmetric SKIN: Normal hydration no rash or ulceration CNS: Cranial nerves 2-12 grossly intact no focal lateralizing neurologic deficit.  Speech is fluent; uvula elevated with phonation, facial symmetry and tongue midline. DTR are normal bilaterally, cerebella exam is intact, barbinski is negative and strengths are equaled bilaterally.  No sensory loss.   Labs on Admission:  Basic Metabolic Panel:  Recent Labs Lab 11/10/12 2014  NA 139  K 3.3*  CL 104  GLUCOSE 123*  BUN 20  CREATININE 1.00   Liver Function Tests: No results found for this basename: AST, ALT, ALKPHOS, BILITOT, PROT, ALBUMIN,  in the last 168 hours No results found for this basename: LIPASE, AMYLASE,  in the last  168 hours No results found for this basename: AMMONIA,  in the last 168 hours CBC:  Recent Labs Lab 11/10/12 1949 11/10/12 2014  WBC 11.3*  --   NEUTROABS 8.9*  --   HGB 8.6* 9.5*  HCT 27.8* 28.0*  MCV 78.3  --   PLT 472*  --    Cardiac Enzymes: No results found for this basename: CKTOTAL, CKMB, CKMBINDEX, TROPONINI,  in the last 168 hours  CBG: No results found for this basename: GLUCAP,  in the last 168 hours   Radiological Exams on Admission: Dg Lumbar Spine Complete  11/10/2012   *RADIOLOGY REPORT*  Clinical Data: Groin pain.  LUMBAR SPINE - COMPLETE 4+ VIEW  Comparison: None.  Findings: There is a transitional anatomy at the lumbosacral junction.  Severe compression fracture at L1 with moderate compression fractures at L3 and L4.  The L1 compression fracture appears chronic.  The L3 and L4  compression fractures are age indeterminate.  Diffuse osteopenia.  IMPRESSION: Old severe L1 compression fracture.  Age indeterminate moderate compression fractures at L3 and L4.   Original Report Authenticated By: Charlett Nose, M.D.   Dg Hip Complete Right  11/10/2012   *RADIOLOGY REPORT*  Clinical Data: Fall, groin pain.  RIGHT HIP - COMPLETE 2+ VIEW  Comparison: None.  Findings: There are fractures through the right superior and inferior pubic rami, slightly displaced.  Diffuse bone demineralization.  No proximal femoral abnormality.  Joint spaces are symmetric within the hip and SI joints.  IMPRESSION: Right superior and inferior pubic rami fractures.   Original Report Authenticated By: Charlett Nose, M.D.   Ct Pelvis Wo Contrast  11/10/2012   *RADIOLOGY REPORT*  Clinical Data: Fall, rami fractures  CT PELVIS WITHOUT CONTRAST  Technique:  Multidetector CT imaging of the pelvis was performed following the standard protocol without intravenous contrast.  Comparison: 11/10/2012 radiograph  Findings: Multiple images are degraded by motion.  No atherosclerotic disease of the aorta and branch vessels.  There is a fat stranding within the retropubic space, abutting the anterior margin of the bladder.  Diffuse osteopenia.  Bilateral sacral ala fractures are nondisplaced.  Fracture of the L5 left transverse process.  Displaced fractures of the right parasymphyseal superior pubic ramus and inferior pubic ramus.  The femoral heads remains seated within the acetabula.  No displaced femur fracture is identified.  IMPRESSION: Displaced fractures of the right superior and inferior pubic rami.  Fat stranding within the retropubic space may reflect blood secondary to the rami fractures.  A bladder injury cannot be excluded in the appropriate clinical setting.  Bilateral nondisplaced sacral fractures.  No femoral fracture identified.  If clinical concern persists, MRI has increased sensitivity.  Discussed via telephone with  Dr. Rulon Abide at 10:58 p.m. on 11/10/2012.   Original Report Authenticated By: Jearld Lesch, M.D.    Assessment/Plan Present on Admission:  . Anemia . Spinal stenosis . HTN (hypertension) . CAD (coronary artery disease), residual 30% septal stenosis . Pulmonary embolism, Jan 2012 . DVT (deep venous thrombosis), 2011  PLAN:  She has likely nonsurgical hip fracture, but will defer to ortho who will see her later today.  In the interim, will give her pain medication.  For the anemia, I don't think she has significant bleeding.  I don't think it is in her hip, therefore, it may be a slow upper GI bleed.  Since she has supratherapeutic INR, and with GI bleed, I will hold her coumadin.  Will start her on an PPI  and check H/H q 6 hours.  Type and Screen was done.  I started her on clear liquid.  Please consult GI in the morning as she may need to be scoped.  She is stable and will be admitted to Dr Isac Sarna service as per prior arrangement.  Her daughter took me aside and told me she drink alcohol a bit too much.  Will place her on Ativan CIWA protocol.  I also discuss her code status and confirmed she is a DNR.  Will honor her wishes. Thank you for allowing me to partake in the care of your nice patient.  Other plans as per orders.  Code Status: DNR.   Houston Siren, MD. [facility] Pager [phone] 7pm to 7am.  11/11/2012, 4:54 AM

## 2012-11-11 NOTE — Progress Notes (Signed)
Referral received today for SNF referral to [facility] or a [facility].  Full assessment to follow.   Sherald Barge, LCSW-A Clinical Social Worker [phone]

## 2012-11-11 NOTE — Consult Note (Signed)
Orthopaedic Trauma Service consult  Requesting: M. Fredderick Phenix, MD (EDP) Reason: pelvic ring fracture, insufficiency fracture   HPI  Patient is a very pleasant 79 year old white female who resides at [facility]. Over the last 6 weeks or so she's had several incidences where she has fallen. Most recent one was about a week and a half to 2 weeks ago when she fell with her walker. She does not recall the exact mechanism. She did have some pain after the fall but was able to mobilize. About 5 days ago she began to have a tremendous difficulty ambulating particularly using her right leg. She did wait a couple days and ultimately presented to the emergency department yesterday for evaluation. hip x-rays and CT scan were performed and demonstrated the insufficiency fractures to the right superior inferior pubic rami as well as bilateral sacral ala fractures in the L5 transverse process fracture on the left side. Orthopedic service was consulted for management of these injuries.  The patient is currently in 3 [place] 28 she does complain of groin and back pain. She is noted tremendous difficulty bearing weight on her right side. She denies any new-onset numbness or tingling. No additional injuries as a result of the fall and noted. She denies any chest pain or shortness of breath. No nausea or vomiting. No abdominal pain. No lightheadedness. No loss of consciousness after her falls.  Past Medical History  Diagnosis Date  . DVT (deep venous thrombosis) 2011  . Pulmonary embolism Jan 2012  . HTN (hypertension)   . Spinal stenosis   . Bradycardia, on admit 08/09/2012   Surgical hx   Denies  Social Hx   Pt lives at [employer] a walker at home for ambulation  Family hx   Denies  Medications Prior to Admission  Medication Sig Dispense Refill  . butalbital-acetaminophen-caffeine (FIORICET, ESGIC) 50-325-40 MG per tablet Take 1 tablet by mouth 2 (two) times daily as needed for pain or  headache.      . diltiazem (CARDIZEM CD) 180 MG 24 hr capsule Take 180 mg by mouth daily.      . fluticasone (FLONASE) 50 MCG/ACT nasal spray Place 2 sprays into the nose daily.      Marland Kitchen loratadine (CLARITIN) 10 MG tablet Take 10 mg by mouth daily as needed for allergies.       Marland Kitchen LORazepam (ATIVAN) 1 MG tablet Take 1-2 mg by mouth See admin instructions. Take 1 tablet in the morning as needed and 2 at bedtime      . meloxicam (MOBIC) 15 MG tablet Take 15 mg by mouth daily.      . metoprolol tartrate (LOPRESSOR) 25 MG tablet Take 12.5 mg by mouth 2 (two) times daily.      . sertraline (ZOLOFT) 25 MG tablet Take 25 mg by mouth daily.      Marland Kitchen triamterene-hydrochlorothiazide (DYAZIDE) 37.5-25 MG per capsule Take 1 capsule by mouth daily.      Marland Kitchen warfarin (COUMADIN) 2.5 MG tablet Take 1.25 mg by mouth daily.      . nitroGLYCERIN (NITROSTAT) 0.4 MG SL tablet Place 1 tablet (0.4 mg total) under the tongue every 5 (five) minutes x 3 doses as needed for chest pain.  25 tablet  12   I have reviewed current meds as well  Review of Systems  Constitutional: Negative for fever and chills.  Respiratory: Negative for shortness of breath.   Cardiovascular: Negative for chest pain.  Gastrointestinal: Negative for abdominal pain.  Musculoskeletal:       R hip/groin pain, back pain   Neurological: Negative for dizziness, tingling, sensory change, focal weakness and headaches.   Exam  BP 110/62  Pulse 68  Temp(Src) 98.4 F (36.9 C) (Oral)  Resp 18  Ht 5\' 3"  (1.6 m)  Wt 51.71 kg (114 lb)  BMI 20.2 kg/m2  SpO2 96%  Physical Exam  Constitutional: She is oriented to person, place, and time. She is cooperative. No distress.  Very pleasant 79 y/o female  Cardiovascular:  Reg, s1 and s2  Pulmonary/Chest:  Clear B   Musculoskeletal:  Pelvis   No instability   Minimal discomfort with lateral compression L>R   Groin pain with mov't of R hip  B LEx   As above   DPN, SPN, TN sensation intact B    EHL,  FHL, AT, PT, peroneals, gastroc motor intact   EHL 5/5 B    + DP pulse B    No significant swelling   Ext warm   No gross deformities noted to LEx B    Femur, knee, tibia, ankle and foot w/o acute findings B, no pain with evaluation   Neurological: She is alert and oriented to person, place, and time.    Imaging   CT pelvis/xray pelvis       B sacral ala fxs      L L5 TVP fx      R superior and inferior rami fxs  Assessment and plan  79 year old female status post ground level fall with insufficiency fractures to pelvic ring  1. Fall 2. pelvic ring fractures, insufficiency type fracture, stable  WBAT  ROM as tolerated  No activity restrictions  To tolerance with pain   PT/OT evals  Ice prn    Will check vitamin D levels as pt has clinical osteoporosis. Also vitamin d deficiency has been associated with increased fall risk   3. DVT/PE prophylaxis  Pt on chronic coumadin due to PE/DVT hx   Currently on hold due to elevated INR   Defer to primary  4. Dispo  Continue per primary   PT/OT  Non-op management of ortho issues    Will follow along   Mearl Latin, PA-C [facility] [phone] (P) 11/11/2012 11:45 AM

## 2012-11-11 NOTE — Progress Notes (Addendum)
Subjective: Patient found to have pelvic and sacral fractures.  She also fractured a transverse process of L5.  Hemoglobin is low and she did have one melanotic stool.  She is slightly super therapeutic on INR and will hold Coumadin.  Consider GI evaluation.  Recovery process from fracture will be slow  Objective: Weight change:   Intake/Output Summary (Last 24 hours) at 11/11/12 0830 Last data filed at 11/10/12 2118  Gross per 24 hour  Intake      0 ml  Output    150 ml  Net   -150 ml   Filed Vitals:   11/10/12 1930 11/10/12 1945 11/11/12 0215 11/11/12 0800  BP: 136/86  153/62 142/74  Pulse: 54 60 61 60  Temp:   98.3 F (36.8 C) 98.4 F (36.9 C)  TempSrc:   Oral Oral  Resp: 19 19 20 18   Height:   5\' 3"  (1.6 m)   Weight:   51.71 kg (114 lb)   SpO2: 100% 97% 94% 96%    General Appearance: Alert, cooperative, complaining of moderate pelvic pain, appears stated age Lungs: Clear to auscultation bilaterally, respirations unlabored Heart: Regular  rhythm, S1 and S2 normal, no murmur, rub or gallop Abdomen: Soft, non-tender, bowel sounds active all four quadrants, no masses, no organomegaly Extremities: Right hip pain with movement of the right leg.  No significant peripheral edema Neuro: Moves feet well.  Nonfocal  Lab Results: Results for orders placed during the hospital encounter of 11/10/12 (from the past 48 hour(s))  CBC WITH DIFFERENTIAL     Status: Abnormal   Collection Time    11/10/12  7:49 PM      Result Value Range   WBC 11.3 (*) 4.0 - 10.5 K/uL   RBC 3.55 (*) 3.87 - 5.11 MIL/uL   Hemoglobin 8.6 (*) 12.0 - 15.0 g/dL   HCT 16.1 (*) 09.6 - 04.5 %   MCV 78.3  78.0 - 100.0 fL   MCH 24.2 (*) 26.0 - 34.0 pg   MCHC 30.9  30.0 - 36.0 g/dL   RDW 40.9 (*) 81.1 - 91.4 %   Platelets 472 (*) 150 - 400 K/uL   Neutrophils Relative % 78 (*) 43 - 77 %   Neutro Abs 8.9 (*) 1.7 - 7.7 K/uL   Lymphocytes Relative 14  12 - 46 %   Lymphs Abs 1.6  0.7 - 4.0 K/uL   Monocytes Relative  8  3 - 12 %   Monocytes Absolute 0.9  0.1 - 1.0 K/uL   Eosinophils Relative 0  0 - 5 %   Eosinophils Absolute 0.0  0.0 - 0.7 K/uL   Basophils Relative 0  0 - 1 %   Basophils Absolute 0.0  0.0 - 0.1 K/uL  PROTIME-INR     Status: Abnormal   Collection Time    11/10/12  7:49 PM      Result Value Range   Prothrombin Time 30.8 (*) 11.6 - 15.2 seconds   INR 3.17 (*) 0.00 - 1.49  POCT I-STAT, CHEM 8     Status: Abnormal   Collection Time    11/10/12  8:14 PM      Result Value Range   Sodium 139  135 - 145 mEq/L   Potassium 3.3 (*) 3.5 - 5.1 mEq/L   Chloride 104  96 - 112 mEq/L   BUN 20  6 - 23 mg/dL   Creatinine, Ser 7.82  0.50 - 1.10 mg/dL   Glucose, Bld 956 (*)  70 - 99 mg/dL   Calcium, Ion 1.61  0.96 - 1.30 mmol/L   TCO2 24  0 - 100 mmol/L   Hemoglobin 9.5 (*) 12.0 - 15.0 g/dL   HCT 04.5 (*) 40.9 - 81.1 %  URINALYSIS, ROUTINE W REFLEX MICROSCOPIC     Status: Abnormal   Collection Time    11/10/12  9:16 PM      Result Value Range   Color, Urine YELLOW  YELLOW   APPearance CLEAR  CLEAR   Specific Gravity, Urine 1.025  1.005 - 1.030   pH 5.0  5.0 - 8.0   Glucose, UA NEGATIVE  NEGATIVE mg/dL   Hgb urine dipstick NEGATIVE  NEGATIVE   Bilirubin Urine SMALL (*) NEGATIVE   Ketones, ur 15 (*) NEGATIVE mg/dL   Protein, ur NEGATIVE  NEGATIVE mg/dL   Urobilinogen, UA 0.2  0.0 - 1.0 mg/dL   Nitrite NEGATIVE  NEGATIVE   Leukocytes, UA NEGATIVE  NEGATIVE   Comment: MICROSCOPIC NOT DONE ON URINES WITH NEGATIVE PROTEIN, BLOOD, LEUKOCYTES, NITRITE, OR GLUCOSE <1000 mg/dL.  CBC     Status: Abnormal   Collection Time    11/11/12  3:31 AM      Result Value Range   WBC 11.3 (*) 4.0 - 10.5 K/uL   RBC 3.42 (*) 3.87 - 5.11 MIL/uL   Hemoglobin 8.4 (*) 12.0 - 15.0 g/dL   HCT 91.4 (*) 78.2 - 95.6 %   MCV 78.7  78.0 - 100.0 fL   MCH 24.6 (*) 26.0 - 34.0 pg   MCHC 31.2  30.0 - 36.0 g/dL   RDW 21.3 (*) 08.6 - 57.8 %   Platelets 411 (*) 150 - 400 K/uL  TYPE AND SCREEN     Status: None    Collection Time    11/11/12  4:58 AM      Result Value Range   ABO/RH(D) O POS     Antibody Screen NEG     Sample Expiration 11/14/2012    ABO/RH     Status: None   Collection Time    11/11/12  4:58 AM      Result Value Range   ABO/RH(D) O POS      Studies/Results: Dg Lumbar Spine Complete  11/10/2012   *RADIOLOGY REPORT*  Clinical Data: Groin pain.  LUMBAR SPINE - COMPLETE 4+ VIEW  Comparison: None.  Findings: There is a transitional anatomy at the lumbosacral junction.  Severe compression fracture at L1 with moderate compression fractures at L3 and L4.  The L1 compression fracture appears chronic.  The L3 and L4 compression fractures are age indeterminate.  Diffuse osteopenia.  IMPRESSION: Old severe L1 compression fracture.  Age indeterminate moderate compression fractures at L3 and L4.   Original Report Authenticated By: Charlett Nose, M.D.   Dg Hip Complete Right  11/10/2012   *RADIOLOGY REPORT*  Clinical Data: Fall, groin pain.  RIGHT HIP - COMPLETE 2+ VIEW  Comparison: None.  Findings: There are fractures through the right superior and inferior pubic rami, slightly displaced.  Diffuse bone demineralization.  No proximal femoral abnormality.  Joint spaces are symmetric within the hip and SI joints.  IMPRESSION: Right superior and inferior pubic rami fractures.   Original Report Authenticated By: Charlett Nose, M.D.   Ct Pelvis Wo Contrast  11/10/2012   *RADIOLOGY REPORT*  Clinical Data: Fall, rami fractures  CT PELVIS WITHOUT CONTRAST  Technique:  Multidetector CT imaging of the pelvis was performed following the standard protocol without intravenous contrast.  Comparison:  11/10/2012 radiograph  Findings: Multiple images are degraded by motion.  No atherosclerotic disease of the aorta and branch vessels.  There is a fat stranding within the retropubic space, abutting the anterior margin of the bladder.  Diffuse osteopenia.  Bilateral sacral ala fractures are nondisplaced.  Fracture of the L5  left transverse process.  Displaced fractures of the right parasymphyseal superior pubic ramus and inferior pubic ramus.  The femoral heads remains seated within the acetabula.  No displaced femur fracture is identified.  IMPRESSION: Displaced fractures of the right superior and inferior pubic rami.  Fat stranding within the retropubic space may reflect blood secondary to the rami fractures.  A bladder injury cannot be excluded in the appropriate clinical setting.  Bilateral nondisplaced sacral fractures.  No femoral fracture identified.  If clinical concern persists, MRI has increased sensitivity.  Discussed via telephone with Dr. Rulon Abide at 10:58 p.m. on 11/10/2012.   Original Report Authenticated By: Jearld Lesch, M.D.   Medications: Scheduled Meds: . diltiazem  180 mg Oral Daily  . docusate sodium  100 mg Oral BID  . fluticasone  2 spray Each Nare Daily  . folic acid  1 mg Oral Daily  . LORazepam  0-4 mg Intravenous Q6H   Followed by  . [START ON 11/13/2012] LORazepam  0-4 mg Intravenous Q12H  . metoprolol tartrate  12.5 mg Oral BID  . multivitamin with minerals  1 tablet Oral Daily  . pantoprazole  40 mg Oral Q1200  . sertraline  25 mg Oral Daily  . sodium chloride  3 mL Intravenous Q12H  . sodium chloride  3 mL Intravenous Q12H  . thiamine  100 mg Oral Daily   Or  . thiamine  100 mg Intravenous Daily  . triamterene-hydrochlorothiazide  1 tablet Oral Daily   Continuous Infusions:  PRN Meds:.sodium chloride, loratadine, LORazepam, LORazepam, morphine injection, nitroGLYCERIN, ondansetron (ZOFRAN) IV, ondansetron, sodium chloride  Assessment/Plan: Principal Problem:   Closed pelvic fracture   Sacral fracture   L5 transverse process fracture   Probable nonsurgical hip fracture - orthopedics to see Active Problems:   Pulmonary embolism, Jan 2012   DVT (deep venous thrombosis), 2011   HTN (hypertension) - controlled   Spinal stenosis - aware   CAD (coronary artery disease),  residual 30% septal stenosis   Anemia - possibly from pelvic bleed and also possibly from chronic GI bleed.  [facility] consult gastroenterology for possible upper endoscopy over the next few days.  Check hemoglobin in a.m.   Anticoagulation therapy - on hold until INR below 2.0 and bleeding shows no signs of continuing   No CODE BLUE    LOS: 1 day   Pearla Dubonnet, MD 11/11/2012, 8:30 AM

## 2012-11-12 DIAGNOSIS — K219 Gastro-esophageal reflux disease without esophagitis: Secondary | ICD-10-CM | POA: Diagnosis present

## 2012-11-12 DIAGNOSIS — M810 Age-related osteoporosis without current pathological fracture: Secondary | ICD-10-CM | POA: Diagnosis present

## 2012-11-12 DIAGNOSIS — E739 Lactose intolerance, unspecified: Secondary | ICD-10-CM | POA: Diagnosis present

## 2012-11-12 DIAGNOSIS — C50919 Malignant neoplasm of unspecified site of unspecified female breast: Secondary | ICD-10-CM | POA: Insufficient documentation

## 2012-11-12 DIAGNOSIS — K5731 Diverticulosis of large intestine without perforation or abscess with bleeding: Secondary | ICD-10-CM | POA: Insufficient documentation

## 2012-11-12 LAB — BASIC METABOLIC PANEL
CO2: 21 mEq/L (ref 19–32)
Calcium: 8.9 mg/dL (ref 8.4–10.5)
Chloride: 102 mEq/L (ref 96–112)
Creatinine, Ser: 1.56 mg/dL — ABNORMAL HIGH (ref 0.50–1.10)
Glucose, Bld: 106 mg/dL — ABNORMAL HIGH (ref 70–99)

## 2012-11-12 LAB — CBC
MCH: 24.9 pg — ABNORMAL LOW (ref 26.0–34.0)
MCV: 78.3 fL (ref 78.0–100.0)
Platelets: 434 10*3/uL — ABNORMAL HIGH (ref 150–400)
RDW: 18.4 % — ABNORMAL HIGH (ref 11.5–15.5)

## 2012-11-12 LAB — OCCULT BLOOD X 1 CARD TO LAB, STOOL: Fecal Occult Bld: NEGATIVE

## 2012-11-12 MED ORDER — VITAMIN D3 25 MCG (1000 UNIT) PO TABS
5000.0000 [IU] | ORAL_TABLET | Freq: Every day | ORAL | Status: DC
  Administered 2012-11-12 – 2012-11-13 (×2): 5000 [IU] via ORAL
  Filled 2012-11-12 (×3): qty 5

## 2012-11-12 MED ORDER — FUROSEMIDE 10 MG/ML IJ SOLN
INTRAMUSCULAR | Status: AC
  Administered 2012-11-12: 20 mg via INTRAVENOUS
  Filled 2012-11-12: qty 4

## 2012-11-12 MED ORDER — FUROSEMIDE 10 MG/ML IJ SOLN: 20.0000 mg | Freq: Once | INTRAMUSCULAR | Status: AC

## 2012-11-12 NOTE — Evaluation (Signed)
Physical Therapy Evaluation Patient Details Name: Cathy Wallace MRN: [id number] DOB: 07-27-1916 Today's Date: 11/12/2012 Time:  -     PT Assessment / Plan / Recommendation Clinical Impression  79 yo female admitted with R displaced sup/inf ramus fracture, bil nondisplaced sacral fx, L L5 transverse fx. Pt is from [facility] where she has personal aide from 9-3. On eval, pt required Min assist for mobility. Pt was unable to ambulate due to pain. Recommend SNF for continued rehab    PT Assessment  Patient needs continued PT services    Follow Up Recommendations  SNF;Supervision/Assistance - 24 hour    Does the patient have the potential to tolerate intense rehabilitation      Barriers to Discharge        Equipment Recommendations  None recommended by PT    Recommendations for Other Services OT consult   Frequency Min 3X/week    Precautions / Restrictions Precautions Precautions: Fall   Pertinent Vitals/Pain R groin area with activity-unrated      Mobility  Bed Mobility Bed Mobility: Supine to Sit;Sit to Supine Supine to Sit: 4: Min assist Sitting - Scoot to Edge of Bed: 4: Min assist Sit to Supine: 4: Min assist Details for Bed Mobility Assistance: Assist to get to EOB-used bedpad to assist with scooting. Assist to get LEs back onto bed. Increased time.  Transfers Transfers: Sit to Stand;Stand to Sit Sit to Stand: 4: Min assist Stand to Sit: 4: Min guard Details for Transfer Assistance: assist to rise, stabilize.  Ambulation/Gait Ambulation/Gait Assistance: Not tested (comment) Ambulation/Gait Assistance Details: pt unable due to pain    Exercises     PT Diagnosis: Difficulty walking;Abnormality of gait;Acute pain  PT Problem List: Decreased range of motion;Decreased activity tolerance;Decreased mobility;Pain;Decreased knowledge of use of DME;Decreased balance PT Treatment Interventions: DME instruction;Gait training;Functional mobility  training;Therapeutic activities;Therapeutic exercise;Balance training;Patient/family education   PT Goals Acute Rehab PT Goals PT Goal Formulation: With patient Time For Goal Achievement: 11/26/12 Potential to Achieve Goals: Good Pt will go Supine/Side to Sit: with supervision PT Goal: Supine/Side to Sit - Progress: Goal set today Pt will go Sit to Supine/Side: with supervision PT Goal: Sit to Supine/Side - Progress: Goal set today Pt will go Sit to Stand: with supervision PT Goal: Sit to Stand - Progress: Goal set today Pt will Transfer Bed to Chair/Chair to Bed: with supervision PT Transfer Goal: Bed to Chair/Chair to Bed - Progress: Goal set today Pt will Ambulate: 1 - 15 feet;with supervision;with least restrictive assistive device PT Goal: Ambulate - Progress: Goal set today  Visit Information  Assistance Needed: +1    Subjective Data      Prior Functioning  Home Living Available Help at Discharge: Personal care attendant Type of Home: Independent living facility ([facility]) Home Access: Level entry;Elevator Home Layout: One level Bathroom Shower/Tub: Health visitor: Handicapped height Home Adaptive Equipment: Grab bars around toilet;Walker - four wheeled Additional Comments: Pt is planning to d/c to rehab section of [facility]. Prior Function Level of Independence: Independent with assistive device(s) Needs Assistance: Bathing;Dressing Bath: Supervision/set-up Dressing: Supervision/set-up Able to Take Stairs?: No Driving: No Comments: ambulates to dining room for meals Communication Communication: No difficulties    Cognition  Cognition Arousal/Alertness: Awake/alert Behavior During Therapy: WFL for tasks assessed/performed Overall Cognitive Status: Within Functional Limits for tasks assessed (some minor memory issues)    Extremity/Trunk Assessment Right Upper Extremity Assessment RUE ROM/Strength/Tone: [facility] for tasks assessed Left Upper  Extremity Assessment LUE  ROM/Strength/Tone: [facility] for tasks assessed Right Lower Extremity Assessment RLE ROM/Strength/Tone: Unable to fully assess;Due to pain Left Lower Extremity Assessment LLE ROM/Strength/Tone: [facility] for tasks assessed   Balance Balance Balance Assessed: Yes Static Sitting Balance Static Sitting - Balance Support: Bilateral upper extremity supported;Feet unsupported Static Sitting - Level of Assistance: 6: Modified independent (Device/Increase time) Static Sitting - Comment/# of Minutes: sat eob for a few minutes. pt stated pain was not bad in sitting position Static Standing Balance Static Standing - Balance Support: Bilateral upper extremity supported Static Standing - Level of Assistance: 5: Stand by assistance Static Standing - Comment/# of Minutes: stood at eob for ~1 minute. Had pt attempt a step but L LE too painful. Had pt weigh shfit laterally but shift to R side was painful as well  End of Session PT - End of Session Activity Tolerance: Patient limited by pain Patient left: in bed;with call bell/phone within reach;with family/visitor present  GP     Rebeca Alert, MPT Pager: [phone]

## 2012-11-12 NOTE — Progress Notes (Signed)
Dr Particia Lather made aware of pt HR 53.  Per MD, okay to hold patient night dose of lopressor 12.5mg .  Pt assymptomatic.

## 2012-11-12 NOTE — ED Provider Notes (Signed)
Medical screening examination/treatment/procedure(s) were performed by non-physician practitioner and as supervising physician I was immediately available for consultation/collaboration.  John-Adam Isaiha Asare, M.D.      John-Adam Eulogio Requena, MD 11/12/12 0432 

## 2012-11-12 NOTE — Evaluation (Signed)
Occupational Therapy Evaluation Patient Details Name: Cathy Wallace MRN: [id number] DOB: [birth year] Today's Date: 11/12/2012 Time: 7829-5621 OT Time Calculation (min): 24 min  OT Assessment / Plan / Recommendation Clinical Impression  Pt admitted with Right groin/hip pain and great difficulty walking. CT pelvis significant for displaced fractures of the right superior and inferior pubic rami as well as fat stranding within the retropubic space.  Pt reports at least 2 falls within past 4 weeks.  Will continue to follow acutely. Re[facility]ending [facility] (pt hoping to go to [place] rehab).    OT Assessment  Patient needs continued OT Services    Follow Up Recommendations  SNF;Supervision/Assistance - 24 hour ([facility])    Barriers to Discharge      Equipment Recommendations  3 in 1 bedside comode    Recommendations for Other Services    Frequency  Min 2X/week    Precautions / Restrictions Precautions Precautions: Fall   Pertinent Vitals/Pain See vitals    ADL  Eating/Feeding: Performed;Independent Where Assessed - Eating/Feeding: Bed level Grooming: Performed;Brushing hair;Set up Where Assessed - Grooming: Unsupported sitting Upper Body Bathing: Simulated;Set up Where Assessed - Upper Body Bathing: Unsupported sitting Lower Body Bathing: Simulated;Moderate assistance Where Assessed - Lower Body Bathing: Supported sit to stand Upper Body Dressing: Simulated;Set up Where Assessed - Upper Body Dressing: Unsupported sitting Lower Body Dressing: Simulated;Maximal assistance Where Assessed - Lower Body Dressing: Supported sit to stand Toilet Transfer: Simulated;Min Pension scheme manager Method: Sit to Barista:  (bed) Equipment Used: Gait belt;Rolling walker Transfers/Ambulation Related to ADLs: min guard for sit<>stand from bed.  Pt refusing to ambulate due to pain (not experiencing significant pain in standing but anxious about  experiencing it if she moves). Pt stating over and over, "I can't put weight through my right leg because it hurts too bad." However, while pt was standing she was putting significant, if not equal, weight through RLE. ADL Comments: Limited by pelvic pain.    OT Diagnosis: Generalized weakness;Acute pain  OT Problem List: Decreased strength;Decreased activity tolerance;Pain;Decreased knowledge of use of DME or AE OT Treatment Interventions: Self-care/ADL training;DME and/or AE instruction;Therapeutic activities;Patient/family education   OT Goals Acute Rehab OT Goals OT Goal Formulation: With patient Time For Goal Achievement: 11/19/12 Potential to Achieve Goals: Good ADL Goals Pt Will Perform Grooming: with modified independence;Standing at sink ADL Goal: Grooming - Progress: Goal set today Pt Will Transfer to Toilet: with modified independence;Ambulation;with DME;Comfort height toilet ADL Goal: Toilet Transfer - Progress: Goal set today Pt Will Perform Toileting - Clothing Manipulation: with modified independence;Sitting on 3-in-1 or toilet;Standing ADL Goal: Toileting - Clothing Manipulation - Progress: Goal set today Pt Will Perform Toileting - Hygiene: with modified independence;Sit to stand from 3-in-1/toilet ADL Goal: Toileting - Hygiene - Progress: Goal set today Miscellaneous OT Goals Miscellaneous OT Goal #1: Pt will perform bed mobility at mod I level as precursor for EOB ADLs. OT Goal: Miscellaneous Goal #1 - Progress: Goal set today  Visit Information  Last OT Received On: 11/12/12 Assistance Needed: +1 PT/OT Co-Evaluation/Treatment: Yes    Subjective Data      Prior Functioning     Home Living Available Help at Discharge: Personal care attendant Type of Home: Independent living facility ([facility]) Home Access: Level entry;Elevator Home Layout: One level Bathroom Shower/Tub: Health visitor: Handicapped height Home Adaptive Equipment: Grab  bars around toilet;Walker - four wheeled Additional Comments: Pt is planning to d/c to rehab section of [facility].  Prior Function Level of Independence: Independent with assistive device(s) Needs Assistance: Bathing;Dressing Bath: Supervision/set-up Dressing: Supervision/set-up Able to Take Stairs?: No Driving: No Comments: ambulates to dining room for meals Communication Communication: No difficulties         Vision/Perception Vision - History Baseline Vision: Wears glasses only for reading   Cognition  Cognition Arousal/Alertness: Awake/alert Behavior During Therapy: WFL for tasks assessed/performed Overall Cognitive Status: Within Functional Limits for tasks assessed (some memory deficits- pt unable to recall her age but able to correctly state birth date. Likely age related)    Extremity/Trunk Assessment Right Upper Extremity Assessment RUE ROM/Strength/Tone: [facility] for tasks assessed Left Upper Extremity Assessment LUE ROM/Strength/Tone: WFL for tasks assessed     Mobility Bed Mobility Bed Mobility: Supine to Sit;Sitting - Scoot to [place] of Bed;Sit to Supine Supine to Sit: 4: Min assist Sitting - Scoot to [place] of Bed: 4: Min guard Sit to Supine: 4: Min assist Details for Bed Mobility Assistance: Pt came up to sit on left side of bed (too painful to go toward right due to RLE pain). Assist to bring LEs back into bed. Transfers Transfers: Sit to Stand;Stand to Sit Sit to Stand: 4: Min guard;From bed Stand to Sit: 4: Min guard;To bed     Exercise     Balance     End of Session OT - End of Session Equipment Utilized During Treatment: Gait belt Activity Tolerance: Patient limited by pain Patient left: in bed;with call bell/phone within reach;with family/visitor present Nurse Communication: Mobility status;Patient requests pain meds  GO    11/12/2012 Cipriano Mile OTR/L Pager [phone] Office [phone]  Cipriano Mile 11/12/2012, 3:26 PM

## 2012-11-12 NOTE — Progress Notes (Signed)
Subjective: Cathy Wallace is is feeling well this morning but quite weak.  Hemoglobin is less than 8.  Pelvic pain tolerable.  We'll plan to transfuse today and back to [facility] tomorrow with rehabilitation.  Blood pressure low this morning but not tachycardic or short of breath  Objective: Weight change:   Intake/Output Summary (Last 24 hours) at 11/12/12 0742 Last data filed at 11/11/12 1700  Gross per 24 hour  Intake    480 ml  Output    300 ml  Net    180 ml   Filed Vitals:   11/11/12 1400 11/11/12 2015 11/12/12 0205 11/12/12 0218  BP: 102/54 114/41 125/35 73/59  Pulse: 67 59 47   Temp: 98.9 F (37.2 C) 98.6 F (37 C) 97.6 F (36.4 C)   TempSrc:  Oral Oral   Resp: 16 18 16    Height:      Weight:      SpO2: 94% 97% 91%    General Appearance: Alert, cooperative, complaining of moderate pelvic pain, appears stated age, pale-appearing  Lungs: Clear to auscultation bilaterally, respirations unlabored  Heart: Regular rhythm, S1 and S2 normal, no murmur, rub or gallop  Abdomen: Soft, non-tender, bowel sounds active all four quadrants, no masses, no organomegaly  Extremities: Right hip pain with movement of the right leg. No significant peripheral edema  Neuro: Moves feet well. Nonfocal      Lab Results: Results for orders placed during the hospital encounter of 11/10/12 (from the past 48 hour(s))  CBC WITH DIFFERENTIAL     Status: Abnormal   Collection Time    11/10/12  7:49 PM      Result Value Range   WBC 11.3 (*) 4.0 - 10.5 K/uL   RBC 3.55 (*) 3.87 - 5.11 MIL/uL   Hemoglobin 8.6 (*) 12.0 - 15.0 g/dL   HCT 14.7 (*) 82.9 - 56.2 %   MCV 78.3  78.0 - 100.0 fL   MCH 24.2 (*) 26.0 - 34.0 pg   MCHC 30.9  30.0 - 36.0 g/dL   RDW 13.0 (*) 86.5 - 78.4 %   Platelets 472 (*) 150 - 400 K/uL   Neutrophils Relative % 78 (*) 43 - 77 %   Neutro Abs 8.9 (*) 1.7 - 7.7 K/uL   Lymphocytes Relative 14  12 - 46 %   Lymphs Abs 1.6  0.7 - 4.0 K/uL   Monocytes Relative 8  3 - 12 %    Monocytes Absolute 0.9  0.1 - 1.0 K/uL   Eosinophils Relative 0  0 - 5 %   Eosinophils Absolute 0.0  0.0 - 0.7 K/uL   Basophils Relative 0  0 - 1 %   Basophils Absolute 0.0  0.0 - 0.1 K/uL  PROTIME-INR     Status: Abnormal   Collection Time    11/10/12  7:49 PM      Result Value Range   Prothrombin Time 30.8 (*) 11.6 - 15.2 seconds   INR 3.17 (*) 0.00 - 1.49  POCT I-STAT, CHEM 8     Status: Abnormal   Collection Time    11/10/12  8:14 PM      Result Value Range   Sodium 139  135 - 145 mEq/L   Potassium 3.3 (*) 3.5 - 5.1 mEq/L   Chloride 104  96 - 112 mEq/L   BUN 20  6 - 23 mg/dL   Creatinine, Ser 6.96  0.50 - 1.10 mg/dL   Glucose, Bld 295 (*) 70 - 99 mg/dL  Calcium, Ion 1.18  1.13 - 1.30 mmol/L   TCO2 24  0 - 100 mmol/L   Hemoglobin 9.5 (*) 12.0 - 15.0 g/dL   HCT 16.1 (*) 09.6 - 04.5 %  URINALYSIS, ROUTINE W REFLEX MICROSCOPIC     Status: Abnormal   Collection Time    11/10/12  9:16 PM      Result Value Range   Color, Urine YELLOW  YELLOW   APPearance CLEAR  CLEAR   Specific Gravity, Urine 1.025  1.005 - 1.030   pH 5.0  5.0 - 8.0   Glucose, UA NEGATIVE  NEGATIVE mg/dL   Hgb urine dipstick NEGATIVE  NEGATIVE   Bilirubin Urine SMALL (*) NEGATIVE   Ketones, ur 15 (*) NEGATIVE mg/dL   Protein, ur NEGATIVE  NEGATIVE mg/dL   Urobilinogen, UA 0.2  0.0 - 1.0 mg/dL   Nitrite NEGATIVE  NEGATIVE   Leukocytes, UA NEGATIVE  NEGATIVE   Comment: MICROSCOPIC NOT DONE ON URINES WITH NEGATIVE PROTEIN, BLOOD, LEUKOCYTES, NITRITE, OR GLUCOSE <1000 mg/dL.  CBC     Status: Abnormal   Collection Time    11/11/12  3:31 AM      Result Value Range   WBC 11.3 (*) 4.0 - 10.5 K/uL   RBC 3.42 (*) 3.87 - 5.11 MIL/uL   Hemoglobin 8.4 (*) 12.0 - 15.0 g/dL   HCT 40.9 (*) 81.1 - 91.4 %   MCV 78.7  78.0 - 100.0 fL   MCH 24.6 (*) 26.0 - 34.0 pg   MCHC 31.2  30.0 - 36.0 g/dL   RDW 78.2 (*) 95.6 - 21.3 %   Platelets 411 (*) 150 - 400 K/uL  TYPE AND SCREEN     Status: None   Collection Time     11/11/12  4:58 AM      Result Value Range   ABO/RH(D) O POS     Antibody Screen NEG     Sample Expiration 11/14/2012    ABO/RH     Status: None   Collection Time    11/11/12  4:58 AM      Result Value Range   ABO/RH(D) O POS    CBC     Status: Abnormal   Collection Time    11/11/12 11:15 AM      Result Value Range   WBC 13.2 (*) 4.0 - 10.5 K/uL   RBC 3.49 (*) 3.87 - 5.11 MIL/uL   Hemoglobin 8.6 (*) 12.0 - 15.0 g/dL   HCT 08.6 (*) 57.8 - 46.9 %   MCV 78.8  78.0 - 100.0 fL   MCH 24.6 (*) 26.0 - 34.0 pg   MCHC 31.3  30.0 - 36.0 g/dL   RDW 62.9 (*) 52.8 - 41.3 %   Platelets 493 (*) 150 - 400 K/uL  CBC     Status: Abnormal   Collection Time    11/11/12  5:35 PM      Result Value Range   WBC 11.9 (*) 4.0 - 10.5 K/uL   RBC 3.55 (*) 3.87 - 5.11 MIL/uL   Hemoglobin 8.8 (*) 12.0 - 15.0 g/dL   HCT 24.4 (*) 01.0 - 27.2 %   MCV 78.9  78.0 - 100.0 fL   MCH 24.8 (*) 26.0 - 34.0 pg   MCHC 31.4  30.0 - 36.0 g/dL   RDW 53.6 (*) 64.4 - 03.4 %   Platelets 502 (*) 150 - 400 K/uL  VITAMIN D 25 HYDROXY     Status: Abnormal   Collection Time  11/11/12  5:35 PM      Result Value Range   Vit D, 25-Hydroxy 13 (*) 30 - 89 ng/mL   Comment: (NOTE)     This assay accurately quantifies Vitamin D, which is the sum of the     25-Hydroxy forms of Vitamin D2 and D3.  Studies have shown that the     optimum concentration of 25-Hydroxy Vitamin D is 30 ng/mL or higher.      Concentrations of Vitamin D between 20 and 29 ng/mL are considered to     be insufficient and concentrations less than 20 ng/mL are considered     to be deficient for Vitamin D.  CBC     Status: Abnormal   Collection Time    11/11/12 11:13 PM      Result Value Range   WBC 12.0 (*) 4.0 - 10.5 K/uL   RBC 3.13 (*) 3.87 - 5.11 MIL/uL   Hemoglobin 7.8 (*) 12.0 - 15.0 g/dL   HCT 16.1 (*) 09.6 - 04.5 %   MCV 78.3  78.0 - 100.0 fL   MCH 24.9 (*) 26.0 - 34.0 pg   MCHC 31.8  30.0 - 36.0 g/dL   RDW 40.9 (*) 81.1 - 91.4 %   Platelets  434 (*) 150 - 400 K/uL  HEMOGLOBIN AND HEMATOCRIT, BLOOD     Status: Abnormal   Collection Time    11/12/12  6:45 AM      Result Value Range   Hemoglobin 8.1 (*) 12.0 - 15.0 g/dL   HCT 78.2 (*) 95.6 - 21.3 %    Studies/Results: Dg Lumbar Spine Complete  11/10/2012   *RADIOLOGY REPORT*  Clinical Data: Groin pain.  LUMBAR SPINE - COMPLETE 4+ VIEW  Comparison: None.  Findings: There is a transitional anatomy at the lumbosacral junction.  Severe compression fracture at L1 with moderate compression fractures at L3 and L4.  The L1 compression fracture appears chronic.  The L3 and L4 compression fractures are age indeterminate.  Diffuse osteopenia.  IMPRESSION: Old severe L1 compression fracture.  Age indeterminate moderate compression fractures at L3 and L4.   Original Report Authenticated By: Charlett Nose, M.D.   Dg Hip Complete Right  11/10/2012   *RADIOLOGY REPORT*  Clinical Data: Fall, groin pain.  RIGHT HIP - COMPLETE 2+ VIEW  Comparison: None.  Findings: There are fractures through the right superior and inferior pubic rami, slightly displaced.  Diffuse bone demineralization.  No proximal femoral abnormality.  Joint spaces are symmetric within the hip and SI joints.  IMPRESSION: Right superior and inferior pubic rami fractures.   Original Report Authenticated By: Charlett Nose, M.D.   Ct Pelvis Wo Contrast  11/10/2012   *RADIOLOGY REPORT*  Clinical Data: Fall, rami fractures  CT PELVIS WITHOUT CONTRAST  Technique:  Multidetector CT imaging of the pelvis was performed following the standard protocol without intravenous contrast.  Comparison: 11/10/2012 radiograph  Findings: Multiple images are degraded by motion.  No atherosclerotic disease of the aorta and branch vessels.  There is a fat stranding within the retropubic space, abutting the anterior margin of the bladder.  Diffuse osteopenia.  Bilateral sacral ala fractures are nondisplaced.  Fracture of the L5 left transverse process.  Displaced  fractures of the right parasymphyseal superior pubic ramus and inferior pubic ramus.  The femoral heads remains seated within the acetabula.  No displaced femur fracture is identified.  IMPRESSION: Displaced fractures of the right superior and inferior pubic rami.  Fat stranding within the retropubic  space may reflect blood secondary to the rami fractures.  A bladder injury cannot be excluded in the appropriate clinical setting.  Bilateral nondisplaced sacral fractures.  No femoral fracture identified.  If clinical concern persists, MRI has increased sensitivity.  Discussed via telephone with Dr. Rulon Abide at 10:58 p.m. on 11/10/2012.   Original Report Authenticated By: Jearld Lesch, M.D.   Dg Pelvis Comp Min 3v  11/11/2012   *RADIOLOGY REPORT*  Clinical Data: Pelvic fractures.  JUDET PELVIS - 3+ VIEW  Comparison: CT scan dated to 11/10/2012  Findings: There is slight displacement of the comminuted fracture of the right superior pubic ramus and right pubic body.  Minimal displacement of the right inferior pubic ramus fracture.  The bilateral sacral ala fractures are not appreciable on these radiographs.  Diffuse osteopenia.  IMPRESSION: Slightly displaced fractures of the right inferior and superior pubic rami and right pubic body.   Original Report Authenticated By: Francene Boyers, M.D.   Medications: Scheduled Meds: . diltiazem  180 mg Oral Daily  . docusate sodium  100 mg Oral BID  . fluticasone  2 spray Each Nare Daily  . folic acid  1 mg Oral Daily  . furosemide  20 mg Intravenous Once  . LORazepam  0-4 mg Intravenous Q6H   Followed by  . [START ON 11/13/2012] LORazepam  0-4 mg Intravenous Q12H  . metoprolol tartrate  12.5 mg Oral BID  . multivitamin with minerals  1 tablet Oral Daily  . pantoprazole  40 mg Oral Q1200  . sertraline  25 mg Oral Daily  . sodium chloride  3 mL Intravenous Q12H  . sodium chloride  3 mL Intravenous Q12H  . thiamine  100 mg Oral Daily   Or  . thiamine  100 mg  Intravenous Daily  . triamterene-hydrochlorothiazide  1 tablet Oral Daily   Continuous Infusions:  PRN Meds:.sodium chloride, acetaminophen, HYDROcodone-acetaminophen, loratadine, LORazepam, LORazepam, morphine injection, nitroGLYCERIN, ondansetron (ZOFRAN) IV, ondansetron, sodium chloride  Assessment/Plan:  Principal Problem:  Closed pelvic fracture - continue pain medication as needed and physical therapy and occupational therapy as tolerated Sacral fracture  L5 transverse process fracture  Probable nonsurgical hip fracture - nonsurgical.  Orthopedics consulted yesterday.  Weightbearing as tolerated  Active Problems:  Pulmonary embolism, Jan 2012 Vitamin D deficiency - replace with 5000 units daily  DVT (deep venous thrombosis), 2011  HTN (hypertension) - controlled  Spinal stenosis - aware  CAD (coronary artery disease), residual 30% septal stenosis  Anemia - possibly from pelvic bleed and also possibly from chronic GI bleed. [facility] consult gastroenterology for possible upper endoscopy over the next few days. Check hemoglobin in a.m.  Anticoagulation therapy - on hold until INR below 2.0 and bleeding shows no signs of continuing - INR today pending.  When INR less than 2.0, we'll resume Coumadin to keep INR between 2 and 2.5 No CODE BLUE  Disposition: Transfuse 2 units today and make sure that hemoglobin is stable over 24 hours and then transferred back to [facility] for skilled nursing for physical therapy and occupational therapy.   LOS: 2 days   Pearla Dubonnet, MD 11/12/2012, 7:42 AM

## 2012-11-12 NOTE — Clinical Documentation Improvement (Signed)
Anemia Blood Loss Clarification  THIS DOCUMENT IS NOT A PERMANENT PART OF THE MEDICAL RECORD  RESPOND TO THE THIS QUERY, FOLLOW THE INSTRUCTIONS BELOW:  1. If needed, update documentation for the patient's encounter via the notes activity.  2. Access this query again and click edit on the In [employer].  3. After updating, or not, click F2 to complete all highlighted (required) fields concerning your review. Select "additional documentation in the medical record" OR "no additional documentation provided".  4. Click Sign note button.  5. The deficiency will fall out of your In Basket *Please let us know if you are not able to complete this workflow by phone or e-mail (listed below).        11/12/12  Dear Dr. Kevan Ny,   In an effort to better capture your patient's severity of illness, reflect appropriate length of stay and utilization of resources, a review of the patient medical record has revealed the following indicators.    Possible Clinical Conditions?  - Acute Blood Loss Anemia - Acute on chronic blood loss anemia - Chronic blood loss anemia - Precipitous drop in Hematocrit - Other Condition________________  Supporting Information - Risk Factors: Falls w/Insufficiency fractures of Sacrum, L5 transverse process, Pubic rami - H&H on admit: 8.8/28.0 to 7.8/24.9 - Current H&H: 8.1/27.5 - Transfusion: PRBC -  H&H monitoring, IVF 10 ml/hr   Reviewed: additional documentation in the medical record  Thank You,  Beverley Fiedler RN BSN Clinical Documentation Specialist: Tele Contact:  [phone]  Health Information Management [place]

## 2012-11-13 ENCOUNTER — Non-Acute Institutional Stay (SKILLED_NURSING_FACILITY): Payer: Medicare Other | Admitting: Geriatric Medicine

## 2012-11-13 DIAGNOSIS — S3210XA Unspecified fracture of sacrum, initial encounter for closed fracture: Secondary | ICD-10-CM

## 2012-11-13 DIAGNOSIS — Z7901 Long term (current) use of anticoagulants: Secondary | ICD-10-CM

## 2012-11-13 DIAGNOSIS — S32009A Unspecified fracture of unspecified lumbar vertebra, initial encounter for closed fracture: Secondary | ICD-10-CM

## 2012-11-13 DIAGNOSIS — S32059A Unspecified fracture of fifth lumbar vertebra, initial encounter for closed fracture: Secondary | ICD-10-CM

## 2012-11-13 DIAGNOSIS — F1020 Alcohol dependence, uncomplicated: Secondary | ICD-10-CM

## 2012-11-13 DIAGNOSIS — D649 Anemia, unspecified: Secondary | ICD-10-CM

## 2012-11-13 DIAGNOSIS — E876 Hypokalemia: Secondary | ICD-10-CM | POA: Diagnosis not present

## 2012-11-13 DIAGNOSIS — S329XXA Fracture of unspecified parts of lumbosacral spine and pelvis, initial encounter for closed fracture: Secondary | ICD-10-CM

## 2012-11-13 LAB — CBC WITH DIFFERENTIAL/PLATELET
Basophils Absolute: 0 10*3/uL (ref 0.0–0.1)
Basophils Relative: 0 % (ref 0–1)
HCT: 34 % — ABNORMAL LOW (ref 36.0–46.0)
Hemoglobin: 11.3 g/dL — ABNORMAL LOW (ref 12.0–15.0)
Lymphocytes Relative: 10 % — ABNORMAL LOW (ref 12–46)
MCHC: 33.2 g/dL (ref 30.0–36.0)
Monocytes Absolute: 1 10*3/uL (ref 0.1–1.0)
Neutro Abs: 9.4 10*3/uL — ABNORMAL HIGH (ref 1.7–7.7)
Neutrophils Relative %: 80 % — ABNORMAL HIGH (ref 43–77)
RDW: 17.4 % — ABNORMAL HIGH (ref 11.5–15.5)
WBC: 11.8 10*3/uL — ABNORMAL HIGH (ref 4.0–10.5)

## 2012-11-13 LAB — BASIC METABOLIC PANEL
BUN: 29 mg/dL — ABNORMAL HIGH (ref 6–23)
Chloride: 98 mEq/L (ref 96–112)
Creatinine, Ser: 1.45 mg/dL — ABNORMAL HIGH (ref 0.50–1.10)
GFR calc Af Amer: 35 mL/min — ABNORMAL LOW (ref >90)
GFR calc non Af Amer: 30 mL/min — ABNORMAL LOW (ref >90)
Potassium: 2.9 mEq/L — ABNORMAL LOW (ref 3.5–5.1)

## 2012-11-13 LAB — TYPE AND SCREEN: ABO/RH(D): O POS

## 2012-11-13 LAB — PROTIME-INR
INR: 2.21 — ABNORMAL HIGH (ref 0.00–1.49)
Prothrombin Time: 23.6 seconds — ABNORMAL HIGH (ref 11.6–15.2)

## 2012-11-13 MED ORDER — POTASSIUM CHLORIDE CRYS ER 20 MEQ PO TBCR
40.0000 meq | EXTENDED_RELEASE_TABLET | Freq: Two times a day (BID) | ORAL | Status: DC
  Administered 2012-11-13: 40 meq via ORAL
  Filled 2012-11-13: qty 2

## 2012-11-13 MED ORDER — WARFARIN SODIUM 2.5 MG PO TABS: 1.2500 mg | ORAL_TABLET | Freq: Every day | ORAL | Status: DC

## 2012-11-13 MED ORDER — ACETAMINOPHEN 325 MG PO TABS: 325.0000 mg | ORAL_TABLET | Freq: Four times a day (QID) | ORAL | Status: DC | PRN

## 2012-11-13 MED ORDER — DSS 100 MG PO CAPS: 100.0000 mg | ORAL_CAPSULE | Freq: Two times a day (BID) | ORAL | Status: DC

## 2012-11-13 MED ORDER — POTASSIUM CHLORIDE CRYS ER 20 MEQ PO TBCR: 40.0000 meq | EXTENDED_RELEASE_TABLET | Freq: Two times a day (BID) | ORAL | Status: DC

## 2012-11-13 MED ORDER — HYDROCODONE-ACETAMINOPHEN 5-325 MG PO TABS: 1.0000 | ORAL_TABLET | ORAL | Status: DC | PRN

## 2012-11-13 MED ORDER — PANTOPRAZOLE SODIUM 40 MG PO TBEC: 40.0000 mg | DELAYED_RELEASE_TABLET | Freq: Every day | ORAL | Status: DC

## 2012-11-13 NOTE — Progress Notes (Signed)
Pt alert and oriented x4.  Pt and patient's daugther given discharge instructions. Denies any questions or concerns. IV access removed site clean dry and intact; no drainage, swelling, bruising noted.  Packet for [facility] given to daughter along with prescriptions.  Pt discharged per wheelchair with volunteer to private vehicle to be discharged to [facility].

## 2012-11-13 NOTE — Progress Notes (Signed)
Clinical Social Work Department CLINICAL SOCIAL WORK PLACEMENT NOTE 11/13/2012  Patient:  Cathy Wallace, Cathy Wallace  Account Number:  [id number] Admit date:  11/10/2012  Clinical Social Worker:  Sherald Barge, Theresia Majors  Date/time:  11/12/2012 05:32 PM  Clinical Social Work is seeking post-discharge placement for this patient at the following level of care:   SKILLED NURSING   (*CSW will update this form in Epic as items are completed)   11/12/2012  Patient/family provided with [facility] Department of Clinical Social Work's list of facilities offering this level of care within the geographic area requested by the patient (or if unable, by the patient's family).  11/12/2012  Patient/family informed of their freedom to choose among providers that offer the needed level of care, that participate in Medicare, Medicaid or managed care program needed by the patient, have an available bed and are willing to accept the patient.  11/12/2012  Patient/family informed of [facility]' ownership interest in [facility], as well as of the fact that they are under no obligation to receive care at this facility.  PASARR submitted to EDS on 11/12/2012 PASARR number received from EDS on 11/12/2012  FL2 transmitted to all facilities in geographic area requested by pt/family on  11/12/2012 FL2 transmitted to all facilities within larger geographic area on 11/12/2012  Patient informed that his/her managed care company has contracts with or will negotiate with  certain facilities, including the following:     Patient/family informed of bed offers received:  11/13/2012 Patient chooses bed at [facility] Physician recommends and patient chooses bed at  [facility]  Patient to be transferred to [facility] on  11/13/2012 Patient to be transferred to facility by family  The following physician request were entered in Epic:   Additional Comments: Sherald Barge, LCSW-A Clinical Social  Worker [phone]

## 2012-11-13 NOTE — Discharge Summary (Signed)
Physician Discharge Summary  NAME:Cathy Wallace  WUJ:[id number]  DOB: 1917/03/27   Admit date: 11/10/2012 Discharge date: 11/13/2012  Discharge Diagnoses:   Closed pelvic fracture - continue pain medication as needed and physical therapy and occupational therapy as tolerated  Sacral fracture - as above  L5 transverse process fracture - as above   History of Pulmonary embolism, Jan 2012 - continue anticoagulation therapy and try to keep INR between 2 and 2.5 Vitamin D deficiency - continue 5000 units daily  History of DVT (deep venous thrombosis), 2011 - continue anticoagulation therapy as above HTN (hypertension) - controlled  Spinal stenosis - aware  CAD (coronary artery disease), residual 30% septal stenosis  Anemia - likely secondary to pelvic bleed and also possibly from chronic GI bleed from over anticoagulation.  Hemoglobin has stabilized after transfusion.  Continue to monitor hemoglobin at skilled nursing facility.  May need GI evaluation with upper endoscopy etc.  Would Hemoccult stools periodically to make sure the are negative  Anticoagulation therapy -we'll resume Coumadin at 1.25 milligrams daily and try to keep INR between 2 and 2.5 Hypokalemia - potassium 2.9 on a.m. of discharge.  We'll start potassium chloride 40 milliequivalents twice daily for 3 days and potassium should be rechecked on 11/16/2012 History of lactose intolerance History of GERD History of osteoporosis History of spinal stenosis causing gait disorder at baseline   No CODE BLUE  Discharge Physical Exam:  General Appearance: Alert, cooperative, no distress, appears stated age  Weight change:   Intake/Output Summary (Last 24 hours) at 11/13/12 0744 Last data filed at 11/13/12 0243  Gross per 24 hour  Intake  502.5 ml  Output    300 ml  Net  202.5 ml   Filed Vitals:   11/12/12 1421 11/12/12 1625 11/12/12 2000 11/13/12 0500  BP: 131/72 118/70 122/72 137/68  Pulse: 57 51 53 63  Temp: 98.2 F  (36.8 C) 98.6 F (37 C) 97.9 F (36.6 C) 97.4 F (36.3 C)  TempSrc: Oral  Oral Oral  Resp: 16 16    Height:   5\' 3"  (1.6 m)   Weight:   54.205 kg (119 lb 8 oz) 54.205 kg (119 lb 8 oz)  SpO2: 92% 97% 93% 99%   General Appearance: Alert, cooperative, complaining of moderate pelvic pain, appears stated age, pale-appearing  Lungs: Clear to auscultation bilaterally, respirations unlabored  Heart: Regular rhythm, S1 and S2 normal, no murmur, rub or gallop  Abdomen: Soft, non-tender, bowel sounds active all four quadrants, no masses, no organomegaly  Extremities: Right hip pain with movement of the right leg. No significant peripheral edema  Neuro: Moves feet well. Nonfocal   Discharge Condition: Improved  Hospital Course: Cathy Wallace as a pleasant 79 year old female who fell approximately 4 weeks prior to admission and had had pain in her hips but it seemed to improve.  She then fell 2 weeks prior to admission and had progressive pain which she can no longer walk.  In the ER a pelvic CT showed displaced superior and inferior she ramus fracture on the right and bilateral nondisplaced sacral fractures and a left L5 transverse process fracture.  Hemoglobin was low at 8.6 grams per deciliter she also complained that she had one episode of melanotic stool prior to admission.  She was taking Mobic along with her Coumadin and the Mobic has been discontinued. She was admitted and treated for pain and will have his long course of physical therapy to ambulate again. Anemia was treated with  transfusion. INR was supratherapeutic and has been allowed to drift down and will try to be maintained in the 2-2.5 range.  Things to follow up in the outpatient setting: Hemoglobin and INR and pain control and continuation of physical therapy and occupational therapy.  Hemoccult stools periodically to assure no further GI bleeding and also repeat potassium level on May 19 to make sure it is normalizing and  adjust potassium dosing accordingly  Consults: Treatment Team:  Budd Palmer, MD  Disposition: 03-Skilled Nursing Facility  Discharge Orders   Future Appointments Provider Department Dept Phone   11/18/2012 9:45 AM Lennette Bihari, MD [facility] [place] [phone]   Future Orders Complete By Expires     Diet - low sodium heart healthy  As directed     Discharge instructions  As directed     Comments:      Would check CBC on May 19 to assure her stable hemoglobin on continued anticoagulation therapy 4 history of recurrent DVT/PE    Increase activity slowly  As directed     Scheduling Instructions:      Patient to start physical therapy with ambulation as tolerated with walker.  This may progress very slowly because of pelvic and sacral fracture.        Medication List    STOP taking these medications       meloxicam 15 MG tablet  Commonly known as:  MOBIC      TAKE these medications       acetaminophen 325 MG tablet  Commonly known as:  TYLENOL  Take 1-2 tablets (325-650 mg total) by mouth every 6 (six) hours as needed for pain.     butalbital-acetaminophen-caffeine 50-325-40 MG per tablet  Commonly known as:  FIORICET, ESGIC  Take 1 tablet by mouth 2 (two) times daily as needed for pain or headache.     diltiazem 180 MG 24 hr capsule  Commonly known as:  CARDIZEM CD  Take 180 mg by mouth daily.     DSS 100 MG Caps  Take 100 mg by mouth 2 (two) times daily.     fluticasone 50 MCG/ACT nasal spray  Commonly known as:  FLONASE  Place 2 sprays into the nose daily.     HYDROcodone-acetaminophen 5-325 MG per tablet  Commonly known as:  NORCO/VICODIN  Take 1 tablet by mouth every 4 (four) hours as needed for pain.     loratadine 10 MG tablet  Commonly known as:  CLARITIN  Take 10 mg by mouth daily as needed for allergies.     LORazepam 1 MG tablet  Commonly known as:  ATIVAN  Take 1-2 mg by mouth See admin instructions. Take 1  tablet in the morning as needed and 2 at bedtime     metoprolol tartrate 25 MG tablet  Commonly known as:  LOPRESSOR  Take 12.5 mg by mouth 2 (two) times daily.     nitroGLYCERIN 0.4 MG SL tablet  Commonly known as:  NITROSTAT  Place 1 tablet (0.4 mg total) under the tongue every 5 (five) minutes x 3 doses as needed for chest pain.     pantoprazole 40 MG tablet  Commonly known as:  PROTONIX  Take 1 tablet (40 mg total) by mouth daily at 12 noon.     potassium chloride SA 20 MEQ tablet  Commonly known as:  K-DUR,KLOR-CON  Take 2 tablets (40 mEq total) by mouth 2 (two) times daily.     sertraline  25 MG tablet  Commonly known as:  ZOLOFT  Take 25 mg by mouth daily.     triamterene-hydrochlorothiazide 37.5-25 MG per capsule  Commonly known as:  DYAZIDE  Take 1 capsule by mouth daily.     warfarin 2.5 MG tablet  Commonly known as:  COUMADIN  Take 0.5 tablets (1.25 mg total) by mouth daily.         The results of significant diagnostics from this hospitalization (including imaging, microbiology, ancillary and laboratory) are listed below for reference.    Significant Diagnostic Studies: Dg Lumbar Spine Complete  11/10/2012   *RADIOLOGY REPORT*  Clinical Data: Groin pain.  LUMBAR SPINE - COMPLETE 4+ VIEW  Comparison: None.  Findings: There is a transitional anatomy at the lumbosacral junction.  Severe compression fracture at L1 with moderate compression fractures at L3 and L4.  The L1 compression fracture appears chronic.  The L3 and L4 compression fractures are age indeterminate.  Diffuse osteopenia.  IMPRESSION: Old severe L1 compression fracture.  Age indeterminate moderate compression fractures at L3 and L4.   Original Report Authenticated By: Charlett Nose, M.D.   Dg Hip Complete Right  11/10/2012   *RADIOLOGY REPORT*  Clinical Data: Fall, groin pain.  RIGHT HIP - COMPLETE 2+ VIEW  Comparison: None.  Findings: There are fractures through the right superior and inferior pubic  rami, slightly displaced.  Diffuse bone demineralization.  No proximal femoral abnormality.  Joint spaces are symmetric within the hip and SI joints.  IMPRESSION: Right superior and inferior pubic rami fractures.   Original Report Authenticated By: Charlett Nose, M.D.   Ct Pelvis Wo Contrast  11/10/2012   *RADIOLOGY REPORT*  Clinical Data: Fall, rami fractures  CT PELVIS WITHOUT CONTRAST  Technique:  Multidetector CT imaging of the pelvis was performed following the standard protocol without intravenous contrast.  Comparison: 11/10/2012 radiograph  Findings: Multiple images are degraded by motion.  No atherosclerotic disease of the aorta and branch vessels.  There is a fat stranding within the retropubic space, abutting the anterior margin of the bladder.  Diffuse osteopenia.  Bilateral sacral ala fractures are nondisplaced.  Fracture of the L5 left transverse process.  Displaced fractures of the right parasymphyseal superior pubic ramus and inferior pubic ramus.  The femoral heads remains seated within the acetabula.  No displaced femur fracture is identified.  IMPRESSION: Displaced fractures of the right superior and inferior pubic rami.  Fat stranding within the retropubic space may reflect blood secondary to the rami fractures.  A bladder injury cannot be excluded in the appropriate clinical setting.  Bilateral nondisplaced sacral fractures.  No femoral fracture identified.  If clinical concern persists, MRI has increased sensitivity.  Discussed via telephone with Dr. Rulon Abide at 10:58 p.m. on 11/10/2012.   Original Report Authenticated By: Jearld Lesch, M.D.   Dg Pelvis Comp Min 3v  11/11/2012   *RADIOLOGY REPORT*  Clinical Data: Pelvic fractures.  JUDET PELVIS - 3+ VIEW  Comparison: CT scan dated to 11/10/2012  Findings: There is slight displacement of the comminuted fracture of the right superior pubic ramus and right pubic body.  Minimal displacement of the right inferior pubic ramus fracture.  The  bilateral sacral ala fractures are not appreciable on these radiographs.  Diffuse osteopenia.  IMPRESSION: Slightly displaced fractures of the right inferior and superior pubic rami and right pubic body.   Original Report Authenticated By: Francene Boyers, M.D.    Microbiology: No results found for this or any previous visit (from the past  240 hour(s)).   Labs: Results for orders placed during the hospital encounter of 11/10/12  CBC WITH DIFFERENTIAL      Result Value Range   WBC 11.3 (*) 4.0 - 10.5 K/uL   RBC 3.55 (*) 3.87 - 5.11 MIL/uL   Hemoglobin 8.6 (*) 12.0 - 15.0 g/dL   HCT 40.9 (*) 81.1 - 91.4 %   MCV 78.3  78.0 - 100.0 fL   MCH 24.2 (*) 26.0 - 34.0 pg   MCHC 30.9  30.0 - 36.0 g/dL   RDW 78.2 (*) 95.6 - 21.3 %   Platelets 472 (*) 150 - 400 K/uL   Neutrophils Relative % 78 (*) 43 - 77 %   Neutro Abs 8.9 (*) 1.7 - 7.7 K/uL   Lymphocytes Relative 14  12 - 46 %   Lymphs Abs 1.6  0.7 - 4.0 K/uL   Monocytes Relative 8  3 - 12 %   Monocytes Absolute 0.9  0.1 - 1.0 K/uL   Eosinophils Relative 0  0 - 5 %   Eosinophils Absolute 0.0  0.0 - 0.7 K/uL   Basophils Relative 0  0 - 1 %   Basophils Absolute 0.0  0.0 - 0.1 K/uL  PROTIME-INR      Result Value Range   Prothrombin Time 30.8 (*) 11.6 - 15.2 seconds   INR 3.17 (*) 0.00 - 1.49  URINALYSIS, ROUTINE W REFLEX MICROSCOPIC      Result Value Range   Color, Urine YELLOW  YELLOW   APPearance CLEAR  CLEAR   Specific Gravity, Urine 1.025  1.005 - 1.030   pH 5.0  5.0 - 8.0   Glucose, UA NEGATIVE  NEGATIVE mg/dL   Hgb urine dipstick NEGATIVE  NEGATIVE   Bilirubin Urine SMALL (*) NEGATIVE   Ketones, ur 15 (*) NEGATIVE mg/dL   Protein, ur NEGATIVE  NEGATIVE mg/dL   Urobilinogen, UA 0.2  0.0 - 1.0 mg/dL   Nitrite NEGATIVE  NEGATIVE   Leukocytes, UA NEGATIVE  NEGATIVE  CBC      Result Value Range   WBC 11.3 (*) 4.0 - 10.5 K/uL   RBC 3.42 (*) 3.87 - 5.11 MIL/uL   Hemoglobin 8.4 (*) 12.0 - 15.0 g/dL   HCT 08.6 (*) 57.8 - 46.9 %   MCV  78.7  78.0 - 100.0 fL   MCH 24.6 (*) 26.0 - 34.0 pg   MCHC 31.2  30.0 - 36.0 g/dL   RDW 62.9 (*) 52.8 - 41.3 %   Platelets 411 (*) 150 - 400 K/uL  CBC      Result Value Range   WBC 13.2 (*) 4.0 - 10.5 K/uL   RBC 3.49 (*) 3.87 - 5.11 MIL/uL   Hemoglobin 8.6 (*) 12.0 - 15.0 g/dL   HCT 24.4 (*) 01.0 - 27.2 %   MCV 78.8  78.0 - 100.0 fL   MCH 24.6 (*) 26.0 - 34.0 pg   MCHC 31.3  30.0 - 36.0 g/dL   RDW 53.6 (*) 64.4 - 03.4 %   Platelets 493 (*) 150 - 400 K/uL  CBC      Result Value Range   WBC 11.9 (*) 4.0 - 10.5 K/uL   RBC 3.55 (*) 3.87 - 5.11 MIL/uL   Hemoglobin 8.8 (*) 12.0 - 15.0 g/dL   HCT 74.2 (*) 59.5 - 63.8 %   MCV 78.9  78.0 - 100.0 fL   MCH 24.8 (*) 26.0 - 34.0 pg   MCHC 31.4  30.0 - 36.0 g/dL  RDW 18.4 (*) 11.5 - 15.5 %   Platelets 502 (*) 150 - 400 K/uL  CBC      Result Value Range   WBC 12.0 (*) 4.0 - 10.5 K/uL   RBC 3.13 (*) 3.87 - 5.11 MIL/uL   Hemoglobin 7.8 (*) 12.0 - 15.0 g/dL   HCT 96.0 (*) 45.4 - 09.8 %   MCV 78.3  78.0 - 100.0 fL   MCH 24.9 (*) 26.0 - 34.0 pg   MCHC 31.8  30.0 - 36.0 g/dL   RDW 11.9 (*) 14.7 - 82.9 %   Platelets 434 (*) 150 - 400 K/uL  VITAMIN D 25 HYDROXY      Result Value Range   Vit D, 25-Hydroxy 13 (*) 30 - 89 ng/mL  HEMOGLOBIN AND HEMATOCRIT, BLOOD      Result Value Range   Hemoglobin 8.1 (*) 12.0 - 15.0 g/dL   HCT 56.2 (*) 13.0 - 86.5 %  BASIC METABOLIC PANEL      Result Value Range   Sodium 138  135 - 145 mEq/L   Potassium 3.9  3.5 - 5.1 mEq/L   Chloride 102  96 - 112 mEq/L   CO2 21  19 - 32 mEq/L   Glucose, Bld 106 (*) 70 - 99 mg/dL   BUN 27 (*) 6 - 23 mg/dL   Creatinine, Ser 7.84 (*) 0.50 - 1.10 mg/dL   Calcium 8.9  8.4 - 69.6 mg/dL   GFR calc non Af Amer 27 (*) >90 mL/min   GFR calc Af Amer 32 (*) >90 mL/min  PROTIME-INR      Result Value Range   Prothrombin Time 28.8 (*) 11.6 - 15.2 seconds   INR 2.90 (*) 0.00 - 1.49  OCCULT BLOOD X 1 CARD TO LAB, STOOL      Result Value Range   Fecal Occult Bld NEGATIVE   NEGATIVE  PROTIME-INR      Result Value Range   Prothrombin Time 23.6 (*) 11.6 - 15.2 seconds   INR 2.21 (*) 0.00 - 1.49  BASIC METABOLIC PANEL      Result Value Range   Sodium 135  135 - 145 mEq/L   Potassium 2.9 (*) 3.5 - 5.1 mEq/L   Chloride 98  96 - 112 mEq/L   CO2 24  19 - 32 mEq/L   Glucose, Bld 116 (*) 70 - 99 mg/dL   BUN 29 (*) 6 - 23 mg/dL   Creatinine, Ser 2.95 (*) 0.50 - 1.10 mg/dL   Calcium 8.9  8.4 - 28.4 mg/dL   GFR calc non Af Amer 30 (*) >90 mL/min   GFR calc Af Amer 35 (*) >90 mL/min  CBC WITH DIFFERENTIAL      Result Value Range   WBC 11.8 (*) 4.0 - 10.5 K/uL   RBC 4.36  3.87 - 5.11 MIL/uL   Hemoglobin 11.3 (*) 12.0 - 15.0 g/dL   HCT 13.2 (*) 44.0 - 10.2 %   MCV 78.0  78.0 - 100.0 fL   MCH 25.9 (*) 26.0 - 34.0 pg   MCHC 33.2  30.0 - 36.0 g/dL   RDW 72.5 (*) 36.6 - 44.0 %   Platelets 433 (*) 150 - 400 K/uL   Neutrophils Relative % 80 (*) 43 - 77 %   Neutro Abs 9.4 (*) 1.7 - 7.7 K/uL   Lymphocytes Relative 10 (*) 12 - 46 %   Lymphs Abs 1.2  0.7 - 4.0 K/uL   Monocytes Relative 8  3 -  12 %   Monocytes Absolute 1.0  0.1 - 1.0 K/uL   Eosinophils Relative 2  0 - 5 %   Eosinophils Absolute 0.2  0.0 - 0.7 K/uL   Basophils Relative 0  0 - 1 %   Basophils Absolute 0.0  0.0 - 0.1 K/uL  POCT I-STAT, CHEM 8      Result Value Range   Sodium 139  135 - 145 mEq/L   Potassium 3.3 (*) 3.5 - 5.1 mEq/L   Chloride 104  96 - 112 mEq/L   BUN 20  6 - 23 mg/dL   Creatinine, Ser 1.61  0.50 - 1.10 mg/dL   Glucose, Bld 096 (*) 70 - 99 mg/dL   Calcium, Ion 0.45  4.09 - 1.30 mmol/L   TCO2 24  0 - 100 mmol/L   Hemoglobin 9.5 (*) 12.0 - 15.0 g/dL   HCT 81.1 (*) 91.4 - 78.2 %  TYPE AND SCREEN      Result Value Range   ABO/RH(D) O POS     Antibody Screen NEG     Sample Expiration 11/14/2012     Unit Number [id number]     Blood Component Type RED CELLS,LR     Unit division 00     Status of Unit ISSUED     Transfusion Status OK TO TRANSFUSE     Crossmatch Result Compatible      Unit Number [id number]     Blood Component Type RBC LR PHER2     Unit division 00     Status of Unit ISSUED     Transfusion Status OK TO TRANSFUSE     Crossmatch Result Compatible    ABO/RH      Result Value Range   ABO/RH(D) O POS    PREPARE RBC (CROSSMATCH)      Result Value Range   Order Confirmation ORDER PROCESSED BY BLOOD BANK      Time coordinating discharge: 40 minutes  Signed: Pearla Dubonnet, MD 11/13/2012, 7:44 AM

## 2012-11-13 NOTE — Progress Notes (Signed)
Gave report Juliann Mule, Charity fundraiser at [facility].

## 2012-11-13 NOTE — Progress Notes (Signed)
Pt will be going back to [facility] today. Dtr will be transporting her back to the facility. No other needs at this time.   Sherald Barge, LCSW-A Clinical Social Worker [phone]

## 2012-11-13 NOTE — Progress Notes (Signed)
Patient ID: Cathy Wallace, female   DOB: 12/13/1916, 79 y.o.   MRN: [id number] [name] (31) Code Status: DNR  Allergies  Allergen Reactions  . Aspirin   . Benadryl (Diphenhydramine Hcl)   . Codeine Nausea And Vomiting  . Penicillins Rash    Chief Complaint:  Chief Complaint  Patient presents with  . Hospitalization Follow-up    Pelvic fracture, sacral fracture, anemia         HPI: This is a 79 year old female resident of [name], independent living section. The patient was admitted to [facility] on 11/10/2012 due to right hip pain and inability to walk. She has fallen twice in the 6 weeks previous to admission, had some right sided pain, previous x-rays did not show fractures. ED evaluation included right hip x-rays and CT scan the abdomen and pelvis which revealed bilateral nondisplaced sacral fractures, right superior and inferior pubic fractures and fracture of the L5 left L5 transverse process. Patient was admitted to hospital for pain management. She was also found to be anemic with a hemoglobin of 8.8, and mildly elevated INR. Warfarin was placed on hold, patient was transfused with one unit of packed red blood cells, hemoglobin improved. Warfarin was restarted, INR was therapeutic at hospital discharge. Patient's pain was managed adequately with hydrocodone. Patient noted to be a daily alcohol drinker, she was placed on institutional withdrawal assessment protocol (CIWA), per nursing report did require dosing with lorazepam for symptoms. None required day of discharge. Patient patient was evaluated by PT and OT, recommendation was made for discharge to skilled nursing facility rehabilitation for continued therapies until able to manage ambulation and ADLs independently. BMP done on day of discharge noted potassium 2.9, supplement was started.  Patient was discharged to [facility] on 11/13/2012.  Her primary care provider is Dr. Kevan Ny, while in the rehabilitation section she will be followed by [facility].   Past Medical History  Diagnosis Date  . DVT (deep venous thrombosis) 2011  . Pulmonary embolism Jan 2012  . HTN (hypertension)   . Spinal stenosis   . Bradycardia, on admit 08/09/2012  . Myocardial infarction   . Pacemaker   . Depression   . Anxiety   . GERD (gastroesophageal reflux disease)   . Cancer     hx of breast cancer  . Arthritis   . Closed pelvic fracture 11/11/2012  . Alcohol dependence 11/15/2012  . Long term (current) use of anticoagulants 11/15/2012  . Sacral fracture, closed 11/15/2012    Bilateral nondisplaced sacral fractures.   . L5 vertebral fracture 11/15/2012    Fracture of the L5 left transverse process.     Past Surgical History  Procedure Laterality Date  . Abdominal hysterectomy    . Elbow surgery    . Cataract extraction Bilateral    Social History:   History   Social History Narrative   Widowed since 2007. Resides at [facility], Independent Livning section since 2006. She was a Insurance underwriter.    No smoking history   Daily alcohol intake (2+oz)     No family history on file.  Medications: Current outpatient prescriptions:acetaminophen (TYLENOL) 325 MG tablet, Take 1-2 tablets (325-650 mg total) by mouth every 6 (six) hours as needed for pain., Disp: 60 tablet, Rfl: 1;  butalbital-acetaminophen-caffeine (FIORICET, ESGIC) 50-325-40 MG per tablet, Take 1 tablet by mouth 2 (two) times daily as needed for pain or headache., Disp: , Rfl: ;  diltiazem (CARDIZEM  CD) 180 MG 24 hr capsule, Take 180 mg by mouth daily., Disp: , Rfl:  docusate sodium 100 MG CAPS, Take 100 mg by mouth 2 (two) times daily., Disp: 10 capsule, Rfl: 0;  fluticasone (FLONASE) 50 MCG/ACT nasal spray, Place 2 sprays into the nose daily., Disp: , Rfl: ;  HYDROcodone-acetaminophen (NORCO/VICODIN) 5-325 MG per tablet, Take 1 tablet by mouth every 4  (four) hours as needed for pain., Disp: 30 tablet, Rfl: 0 loratadine (CLARITIN) 10 MG tablet, Take 10 mg by mouth daily as needed for allergies. , Disp: , Rfl: ;  LORazepam (ATIVAN) 1 MG tablet, Take 1-2 mg by mouth See admin instructions. Take 1 tablet in the morning as needed and 2 at bedtime, Disp: , Rfl: ;  metoprolol tartrate (LOPRESSOR) 25 MG tablet, Take 12.5 mg by mouth 2 (two) times daily., Disp: , Rfl:  nitroGLYCERIN (NITROSTAT) 0.4 MG SL tablet, Place 1 tablet (0.4 mg total) under the tongue every 5 (five) minutes x 3 doses as needed for chest pain., Disp: 25 tablet, Rfl: 12;  pantoprazole (PROTONIX) 40 MG tablet, Take 1 tablet (40 mg total) by mouth daily at 12 noon., Disp: 30 tablet, Rfl: 1;  potassium chloride SA (K-DUR,KLOR-CON) 20 MEQ tablet, Take 2 tablets (40 mEq total) by mouth 2 (two) times daily., Disp: 12 tablet, Rfl: 0 sertraline (ZOLOFT) 25 MG tablet, Take 25 mg by mouth daily., Disp: , Rfl: ;  triamterene-hydrochlorothiazide (DYAZIDE) 37.5-25 MG per capsule, Take 1 capsule by mouth daily., Disp: , Rfl: ;  warfarin (COUMADIN) 2.5 MG tablet, Take 0.5 tablets (1.25 mg total) by mouth daily., Disp: 30 tablet, Rfl: 1  Review of Systems:  DATA OBTAINED: from patient, nurse, medical record, family member (daughter, Meriam Sprague) GENERAL: Feels well No fevers, fatigue.Decreased appetite SKIN: No itch, rash or open wounds EYES: No eye pain, dryness or itching  No change in vision EARS: No earache, tinnitus, decreased hearing NOSE: No congestion, drainage or bleeding MOUTH/THROAT: No mouth or tooth pain  No difficulty chewing or swallowing RESPIRATORY: No cough, wheezing, SOB CARDIAC: No chest pain, palpitations  No edema. GI: No abdominal pain  No N/V/D or constipation  No heartburn or reflux. Last BM 5/14 GU: No dysuria, frequency or urgency  No change in urine volume or character  MUSCULOSKELETAL: Pain rt. groin with any  Movement.  NEUROLOGIC: No dizziness, fainting, headache No  change in mental status.  PSYCHIATRIC: No feelings of anxiety, depression Sleeps well.  No behavior issue.   Filed Vitals:   11/13/12 1652  BP: 126/62  Pulse: 58  Temp: 98.2 F (36.8 C)  Resp: 13  Height: 5\' 3"  (1.6 m)  Weight: 114 lb 9.6 oz (51.982 kg)  SpO2: 94%  Body mass index is 20.31 kg/(m^2).  Physical Exam: GENERAL APPEARANCE: No acute distress, appropriately groomed, normal body habitus. Alert, pleasant, conversant. HEAD: Normocephalic, atraumatic EYES: Conjunctiva/lids clear. Pupils round, reactive. EOMs intact.  EARS: External exam WNL, canals clear, TM WNL. HOH NOSE: No deformity or discharge. MOUTH/THROAT: Lips w/o lesions. Oral mucosa, tongue moist, w/o lesion. Oropharynx w/o redness or lesions.  NECK: Supple, full ROM. No thyroid tenderness, enlargement or nodule LYMPHATICS: No head, neck or supraclavicular adenopathy RESPIRATORY: Breathing is even, unlabored. Lung sounds are clear and full.  CARDIOVASCULAR: Heart RRR. No murmur or extra heart sounds  ARTERIAL: No carotid bruit. Carotid pulse 2+. No palpable DP pulse  VENOUS: No varicosities. No venous stasis skin changes  EDEMA: No peripheral or periorbital edema.  GASTROINTESTINAL: Abdomen is  soft, non-tender, not distended w/ normal bowel sounds.  GENITOURINARY: Bladder non tender, not distended. MUSCULOSKELETAL: Moves Bil UE and Left LE with full ROM, strength and tone. Limited ROM RT.LE, decreased strength, tenderness rt. Groin, medial thigh, no ecchymosis. Diffuse tenderness lower back.  NEUROLOGIC: Oriented to time, place, person. Cranial nerves 2-12 grossly intact, speech clear, no tremor.  PSYCHIATRIC: Mood and affect appropriate to situation  Labs reviewed: Basic Metabolic Panel:  Recent Labs  19/14/78 0645 11/10/12 2014 11/12/12 0645 11/13/12 0523  NA 144 139 138 135  K 3.8 3.3* 3.9 2.9*  CL 114* 104 102 98  CO2 17*  --  21 24  GLUCOSE 109* 123* 106* 116*  BUN 19 20 27* 29*  CREATININE 0.99  1.00 1.56* 1.45*  CALCIUM 8.4  --  8.9 8.9    CBC:  Recent Labs  11/10/12 1949  11/11/12 1735 11/11/12 2313 11/12/12 0645 11/13/12 0523  WBC 11.3*  < > 11.9* 12.0*  --  11.8*  NEUTROABS 8.9*  --   --   --   --  9.4*  HGB 8.6*  < > 8.8* 7.8* 8.1* 11.3*  HCT 27.8*  < > 28.0* 24.5* 25.7* 34.0*  MCV 78.3  < > 78.9 78.3  --  78.0  PLT 472*  < > 502* 434*  --  433*  < > = values in this interval not displayed.  Radiological Exams:  Reviewed R. Hip and Lumbar spine xrays CT abd/ pelvis from 5/13,14/2014  Assessment/Plan Alcohol dependence Patient was placed on withdrawal assessment protocol during hospitalization due to daily intake of scotch. She did require some medication with lorazepam. Patient admits to 2 drinks of scotch daily, daughter reports this is frequently more. Patient was instructed at hospital discharge for no alcohol intake, she understands this but does not agree. Discussed again today, she'll not be having any alcohol during rehabilitation stay, strongly recommend no alcohol after discharge due to chronic warfarin therapy, risk to fall.  Anemia Patient received transfusion during hospitalization for hemoglobin 8.8, hemoglobin at discharge was 11.3 the will repeat CBC 11/16/2012  Long term (current) use of anticoagulants INR at hospital discharge therapeutic at 2.21. Will continue current warfarin dose 1.25 mg q. day, Next INR 11/16/12  Closed pelvic fracture Right superior and inferior pubic rami fractures noted on right hip x-ray 11/10/2012, confirmed by CT scan later that day. These are non-operative fractures, treatment will be supportive with pain medication and limited ambulation until pain resolves. Patient will be evaluated by PT and OT to maximize functional status.   Sacral fracture, closed Bilateral nondisplaced sacral fractures noted on CT scan 11/10/2012. Treatment is supportive with pain management and assistance with ambulation and ADLs until fractures  heal.    Bathing: Dependent, Bladder Management: Continent, Bowel Management: Continent, Feeding: Independent, Hygiene and Grooming: Maximal Assist, Sit to Stand: Maximal Assist, Toileting / Clothing: Maximal Assist, Transfers: Maximal Assist  Family/ staff Communication: Extensive discussion this afternoon with patient's daughter Meriam Sprague regarding Education officer, community. It is very clear she and her brother did not wish their mother to return to the hospital. They each feel hospitalization is very upsetting and confusing to their Mom,  prefer care to be provided at facility. This patient has a DO NOT RESUSCITATE in place already, we discussed Medical Orders for Scope of Treatment. (MOST) from. Meriam Sprague will discuss further with her Mom and brother. Anticipate will complete MOST form early next week. Daughter also is asking about changing primary care services to  [facility] due to Facilities manager on site at [employer]. She will discuss this further with her with her mom and brother.   Goals of care:  Return to [place] status  Labs/tests ordered: CBC, BMP, INR 11/16/12  Greater than 45 minutes spent with patient, daughter, and chart  Riot Barrick T.Kashtyn Jankowski, NP-C 11/13/2012

## 2012-11-15 ENCOUNTER — Encounter: Payer: Self-pay | Admitting: Geriatric Medicine

## 2012-11-15 DIAGNOSIS — D649 Anemia, unspecified: Secondary | ICD-10-CM

## 2012-11-15 DIAGNOSIS — S32059A Unspecified fracture of fifth lumbar vertebra, initial encounter for closed fracture: Secondary | ICD-10-CM

## 2012-11-15 DIAGNOSIS — F1020 Alcohol dependence, uncomplicated: Secondary | ICD-10-CM

## 2012-11-15 DIAGNOSIS — S3210XA Unspecified fracture of sacrum, initial encounter for closed fracture: Secondary | ICD-10-CM

## 2012-11-15 DIAGNOSIS — Z7901 Long term (current) use of anticoagulants: Secondary | ICD-10-CM | POA: Insufficient documentation

## 2012-11-15 HISTORY — DX: Unspecified fracture of fifth lumbar vertebra, initial encounter for closed fracture: S32.059A

## 2012-11-15 HISTORY — DX: Alcohol dependence, uncomplicated: F10.20

## 2012-11-15 HISTORY — DX: Anemia, unspecified: D64.9

## 2012-11-15 HISTORY — DX: Long term (current) use of anticoagulants: Z79.01

## 2012-11-15 HISTORY — DX: Unspecified fracture of sacrum, initial encounter for closed fracture: S32.10XA

## 2012-11-15 NOTE — Assessment & Plan Note (Signed)
Bilateral nondisplaced sacral fractures noted on CT scan 11/10/2012. Treatment is supportive with pain management and assistance with ambulation and ADLs until fractures heal.

## 2012-11-15 NOTE — Assessment & Plan Note (Signed)
Patient received transfusion during hospitalization for hemoglobin 8.8, hemoglobin at discharge was 11.3 the will repeat CBC 11/16/2012

## 2012-11-15 NOTE — Assessment & Plan Note (Signed)
Patient was placed on withdrawal assessment protocol during hospitalization due to daily intake of scotch. She did require some medication with lorazepam. Patient admits to 2 drinks of scotch daily, daughter reports this is frequently more. Patient was instructed at hospital discharge for no alcohol intake, she understands this but does not agree. Discussed again today, she'll not be having any alcohol during rehabilitation stay, strongly recommend no alcohol after discharge due to chronic warfarin therapy, risk to fall.

## 2012-11-15 NOTE — Assessment & Plan Note (Signed)
INR at hospital discharge therapeutic at 2.21. Will continue current warfarin dose 1.25 mg q. day, Next INR 11/16/12

## 2012-11-15 NOTE — Assessment & Plan Note (Signed)
Right superior and inferior pubic rami fractures noted on right hip x-ray 11/10/2012, confirmed by CT scan later that day. These are non-operative fractures, treatment will be supportive with pain medication and limited ambulation until pain resolves. Patient will be evaluated by PT and OT to maximize functional status.

## 2012-11-17 ENCOUNTER — Telehealth: Payer: Self-pay | Admitting: Cardiovascular Disease

## 2012-11-17 ENCOUNTER — Encounter: Payer: Self-pay | Admitting: Cardiovascular Disease

## 2012-11-17 ENCOUNTER — Encounter: Payer: Self-pay | Admitting: Geriatric Medicine

## 2012-11-17 LAB — VITAMIN D 1,25 DIHYDROXY
Vitamin D 1, 25 (OH)2 Total: 37 pg/mL (ref 18–72)
Vitamin D2 1, 25 (OH)2: <8 pg/mL
Vitamin D3 1, 25 (OH)2: 37 pg/mL

## 2012-11-17 NOTE — Consult Note (Signed)
I have seen and examined the patient. I agree with the findings above. Pelvic ring fracture PT w [facility] w/u  Budd Palmer, MD

## 2012-11-17 NOTE — Telephone Encounter (Signed)
Message forwarded to W. Waddell, CMA.  

## 2012-11-17 NOTE — Telephone Encounter (Signed)
[name] (daughter in Social worker) , wants to let us know that Cathy Wallace is in rehab and will not be able to get around for about a month and [name] is her doctor at rehab and wants to know is there  any other treatment that is needed while she is there. Unless [name] has something particular that he has to do to care for they want [name] to be able to take car of what is needed while she is at [facility]. They are changing her pcp to [name]   Thanks

## 2012-11-18 ENCOUNTER — Ambulatory Visit: Payer: Medicare Other | Admitting: Cardiovascular Disease

## 2012-11-24 ENCOUNTER — Other Ambulatory Visit: Payer: Self-pay | Admitting: Geriatric Medicine

## 2012-11-24 MED ORDER — HYDROCODONE-ACETAMINOPHEN 5-325 MG PO TABS: 1.0000 | ORAL_TABLET | ORAL | Status: DC | PRN

## 2012-12-01 ENCOUNTER — Non-Acute Institutional Stay (SKILLED_NURSING_FACILITY): Payer: Medicare Other | Admitting: Geriatric Medicine

## 2012-12-01 ENCOUNTER — Encounter: Payer: Self-pay | Admitting: Geriatric Medicine

## 2012-12-01 DIAGNOSIS — E538 Deficiency of other specified B group vitamins: Secondary | ICD-10-CM

## 2012-12-01 DIAGNOSIS — G4700 Insomnia, unspecified: Secondary | ICD-10-CM

## 2012-12-01 DIAGNOSIS — D649 Anemia, unspecified: Secondary | ICD-10-CM

## 2012-12-01 DIAGNOSIS — S3210XA Unspecified fracture of sacrum, initial encounter for closed fracture: Secondary | ICD-10-CM

## 2012-12-01 DIAGNOSIS — Z7901 Long term (current) use of anticoagulants: Secondary | ICD-10-CM

## 2012-12-01 DIAGNOSIS — IMO0001 Reserved for inherently not codable concepts without codable children: Secondary | ICD-10-CM

## 2012-12-01 DIAGNOSIS — S329XXD Fracture of unspecified parts of lumbosacral spine and pelvis, subsequent encounter for fracture with routine healing: Secondary | ICD-10-CM

## 2012-12-01 HISTORY — DX: Insomnia, unspecified: G47.00

## 2012-12-01 HISTORY — DX: Deficiency of other specified B group vitamins: E53.8

## 2012-12-01 NOTE — Assessment & Plan Note (Signed)
Continues to have some discomfort while walking, mostly in the lower back. He is making progress with independent activities. For home OT evaluate safety evaluation today. Patient is hopeful she will be able to discharge to her independent living home the end of next week.

## 2012-12-01 NOTE — Assessment & Plan Note (Signed)
Patient with increased pain in the last day or so. Possibly due to "popping down" in chairs. She's been giving a cushion to use in a recliner, reports less discomfort today

## 2012-12-01 NOTE — Progress Notes (Signed)
Patient ID: Cathy Wallace, female   DOB: Jun 25, 1917, 79 y.o.   MRN: [id number] Patient ID: Cathy Wallace, female   DOB: [birth year], 79 y.o.   MRN: [id number] [name] (31) Code Status: DNR  Allergies  Allergen Reactions  . Aspirin   . Benadryl (Diphenhydramine Hcl)   . Codeine Nausea And Vomiting  . Penicillins Rash    Chief Complaint:  Chief Complaint  Patient presents with  . F/U pelvic fracture  . Insomnia   HPI: This is a 79 year old female resident of [name] Independent Living section admitted to [facility] on 11/10/2012 due to right hip pain and inability to walk. She has fallen twice in the 6 weeks previous to admission, had some right sided pain, previous x-rays did not show fractures. ED evaluation included right hip x-rays and CT scan the abdomen and pelvis which revealed bilateral nondisplaced sacral fractures, right superior and inferior pubic fractures and fracture of the L5 left L5 transverse process. Patient was admitted to hospital for pain management. She was also found to be anemic with a hemoglobin of 8.8, and mildly elevated INR. Warfarin was placed on hold, patient was transfused with one unit of packed red blood cells, hemoglobin improved. Warfarin was restarted, INR was therapeutic at hospital discharge. Patient's pain was managed adequately with hydrocodone. Patient noted to be a daily alcohol drinker, she was placed on institutional withdrawal assessment protocol (CIWA), per nursing report did require dosing with lorazepam for symptoms. None required day of discharge. Patient patient was evaluated by PT and OT, recommendation was made for discharge to skilled nursing facility rehabilitation for continued therapies until able to manage ambulation and ADLs independently. BMP done on day of discharge noted potassium 2.9, supplement was started.  Since admission [facility] on  11/13/2012, patient's PCP has been changed to [facility]. She has been working consistently with PT and OT, regaining some independent function. She has been complaining of some lower back pain for the last day or so.  Anemia is improving with most recent hematocrit 12.0 on 11/24/2012. Hypokalemia has improved as well. Patient was noted to have low B12 level, supplement has been started. Anticoagulation has been changed to Xarelto. Patient has resumed daily scotch beverage with dinner time. No adverse effects have been noted. In the last day or so patient has complaint of not being able to sleep well, requested a sleeping pill. Melatonin was started last night.  Functional Status:  For OT home evaluation today Bathing: Set up , stand by assist  Bladder Management: Continent, Bowel Management: Continent Feeding: Independent Hygiene and Grooming: Set up , stand by assist, Toileting / Clothing: Maximal Assist,  Transfers:Set up , stand by assist  Medications reviewed  Data Reviewed:  Lab, [facility], external  11/16/2012: WBC 11.0, hemoglobin 11.5, hematocrit 35.7, platelets 199.  INR 2.73   glucose 111, BUN 25, creatinine 0.99, sodium 138, potassium 5.0 11/24/2012 WBC 10.4, hemoglobin 12.0, 30 hematocrit 37.0, platelets 465  Glucose 111, BUN 27, creatinine 1.03, sodium 136, potassium 3.7  TSH 0.651  Vitamin B12 280    Review of Systems:  DATA OBTAINED: from patient, nurse, medical record GENERAL: Feels well No fevers, fatigue.Eating well at most meals, has lost weight SKIN: No itch, rash or open wounds EYES: No eye pain, dryness or itching  No change in vision EARS: No earache, tinnitus, decreased hearing NOSE: No congestion, drainage or bleeding MOUTH/THROAT: No mouth or tooth pain  No  difficulty chewing or swallowing RESPIRATORY: No cough, wheezing, SOB CARDIAC: No chest pain, palpitations  No edema. GI: No abdominal pain  No N/V/D BM hard to pass  No heartburn or reflux.   GU: No dysuria, frequency or urgency  No change in urine volume or character  MUSCULOSKELETAL:  Lower back pain NEUROLOGIC: No dizziness, fainting, headache No change in mental status.  PSYCHIATRIC: No feelings of anxiety, depression. Slept well last night.  No behavior issue.   Filed Vitals:   12/01/12 1248  BP: 138/61  Pulse: 52  Weight: 111 lb 6.4 oz (50.531 kg)  SpO2: 95%  Body mass index is 19.74 kg/(m^2).  Physical Exam: GENERAL APPEARANCE: No acute distress, appropriately groomed, normal body habitus. Alert, pleasant, conversant. HEAD: Normocephalic, atraumatic EYES: Conjunctiva/lids clear. Pupils round, reactive. EOMs intact.  EARS: External exam WNL, canals clear, TM WNL. HOH NOSE: No deformity or discharge. MOUTH/THROAT: Lips w/o lesions. Oral mucosa, tongue moist, w/o lesion. Oropharynx w/o redness or lesions.  NECK: Supple, full ROM. No thyroid tenderness, enlargement or nodule LYMPHATICS: No head, neck or supraclavicular adenopathy RESPIRATORY: Breathing is even, unlabored. Lung sounds are clear and full.  CARDIOVASCULAR: Heart RRR. No murmur or extra heart sounds  ARTERIAL: No carotid bruit. Carotid pulse 2+. No palpable DP pulse  VENOUS: No varicosities. No venous stasis skin changes  EDEMA: No peripheral or periorbital edema. She GASTROINTESTINAL: Abdomen is soft, non-tender, not distended w/ normal bowel sounds.  GENITOURINARY: Bladder non tender, not distended. MUSCULOSKELETAL: Moves Bil UE and Left LE with full ROM, strength and tone. Limited ROM RT.LE, decreased strength, tenderness rt. Groin, medial thigh, no ecchymosis. Diffuse tenderness lower back.  NEUROLOGIC: Oriented to time, place, person. Cranial nerves 2-12 grossly intact, speech clear, no tremor.  PSYCHIATRIC: Mood and affect appropriate to situation   Assessment/Plan B12 deficiency Borderline low B12 level on laboratory study,  may be related to poor nutrition and alcohol intake. Supplement  started.   Closed pelvic fracture Continues to have some discomfort while walking, mostly in the lower back. He is making progress with independent activities. For home OT evaluate safety evaluation today. Patient is hopeful she will be able to discharge to her independent living home the end of next week.  Anemia Anemia related to pelvic fracture. No further bleeding, hemoglobin and hematocrit much improved. We'll follow CBC intervals due to anticoagulation  Sacral fracture, closed Patient with increased pain in the last day or so. Possibly due to "popping down" in chairs. She's been giving a cushion to use in a recliner, reports less discomfort today  Long term (current) use of anticoagulants Anticoagulation changed from warfarin to several toe on 11/17/2012. Patient is tolerating this medication without any adverse effects, specifically no signs of bleeding. Will check CBC at intervals.  Insomnia Patient reports long-standing problem with sleep. Did not sleep well over the weekend. Melatonin started last night with good result.    Goals of care:  Return to [place] status  Labs/tests ordered: 7/12014: CBC, CMP, B12  Follow Up: As needed  Makyle Eslick T.Elba Dendinger, NP-C 12/01/2012

## 2012-12-01 NOTE — Assessment & Plan Note (Signed)
Borderline low B12 level on laboratory study,  may be related to poor nutrition and alcohol intake. Supplement started.

## 2012-12-01 NOTE — Assessment & Plan Note (Signed)
Anticoagulation changed from warfarin to several toe on 11/17/2012. Patient is tolerating this medication without any adverse effects, specifically no signs of bleeding. Will check CBC at intervals.

## 2012-12-01 NOTE — Assessment & Plan Note (Signed)
Patient reports long-standing problem with sleep. Did not sleep well over the weekend. Melatonin started last night with good result.

## 2012-12-01 NOTE — Assessment & Plan Note (Signed)
Anemia related to pelvic fracture. No further bleeding, hemoglobin and hematocrit much improved. We'll follow CBC intervals due to anticoagulation

## 2012-12-07 ENCOUNTER — Non-Acute Institutional Stay (SKILLED_NURSING_FACILITY): Payer: Medicare Other | Admitting: Geriatric Medicine

## 2012-12-07 ENCOUNTER — Encounter: Payer: Self-pay | Admitting: Geriatric Medicine

## 2012-12-07 DIAGNOSIS — S329XXD Fracture of unspecified parts of lumbosacral spine and pelvis, subsequent encounter for fracture with routine healing: Secondary | ICD-10-CM

## 2012-12-07 DIAGNOSIS — D649 Anemia, unspecified: Secondary | ICD-10-CM

## 2012-12-07 DIAGNOSIS — IMO0001 Reserved for inherently not codable concepts without codable children: Secondary | ICD-10-CM

## 2012-12-07 DIAGNOSIS — S3210XA Unspecified fracture of sacrum, initial encounter for closed fracture: Secondary | ICD-10-CM

## 2012-12-07 DIAGNOSIS — I2699 Other pulmonary embolism without acute cor pulmonale: Secondary | ICD-10-CM

## 2012-12-07 DIAGNOSIS — Z7901 Long term (current) use of anticoagulants: Secondary | ICD-10-CM

## 2012-12-07 DIAGNOSIS — E538 Deficiency of other specified B group vitamins: Secondary | ICD-10-CM

## 2012-12-07 DIAGNOSIS — E559 Vitamin D deficiency, unspecified: Secondary | ICD-10-CM

## 2012-12-07 DIAGNOSIS — F1020 Alcohol dependence, uncomplicated: Secondary | ICD-10-CM

## 2012-12-07 HISTORY — DX: Vitamin D deficiency, unspecified: E55.9

## 2012-12-07 NOTE — Progress Notes (Signed)
Patient ID: Cathy Wallace, female   DOB: 01/04/1917, 79 y.o.   MRN: [id number] [name] (31) Code Status: DNR  Allergies  Allergen Reactions  . Aspirin   . Benadryl (Diphenhydramine Hcl)   . Codeine Nausea And Vomiting  . Penicillins Rash    Chief Complaint:  Chief Complaint  Patient presents with  . F/U pelvic/sacral fractures    Rehab discharge   HPI: This 79 year old female resident of [name] section admitted to [facility] on 11/10/2012 due to right hip pain and inability to walk. She has fallen twice in the 6 weeks previous to admission, had some right sided pain, previous x-rays did not show fractures. ED evaluation included right hip x-rays and CT scan of the abdomen and pelvis which revealed bilateral nondisplaced sacral fractures, right superior and inferior pubic fractures and fracture of the left L5 transverse process. Patient was admitted to hospital for pain management. She was also found to be anemic with a hemoglobin of 8.8, and mildly elevated INR. Warfarin was placed on hold, patient was transfused with one unit of packed red blood cells, hemoglobin improved. Warfarin was restarted, INR was therapeutic at hospital discharge. Patient's pain was managed adequately with hydrocodone. Patient noted to be a daily alcohol drinker, she was placed on institutional withdrawal assessment protocol (CIWA), per nursing report did require dosing with lorazepam for symptoms. None required day of discharge. Patient was evaluated by PT and OT, recommendation was made for discharge to skilled nursing facility rehabilitation for continued therapies until able to manage ambulation and ADLs independently. BMP done on day of discharge noted potassium 2.9, supplement was started.  Since admission [facility] on 11/13/2012, patient's PCP has been changed to [facility]. She has worked  consistently with PT and OT, regaining  independent function with ambulation/ ADLs.  A home safety evaluation was completed by OT last week, no modifications required.   Anemia improved with most recent hematocrit 12.0 on 11/24/2012. Hypokalemia has improved as well. Patient was noted to have low B12 level supplement was started. Vitamin D supplementation was also started. Anticoagulation was changed to Xarelto. Patient has complaint of not being able to sleep well, requested a sleeping pill. Melatonin was started last week as sleep aid. Patient has resumed daily scotch beverage with dinner time. No adverse effects have been noted. Reinforced with pt. Today to limit herself to 1 drink.  Functional Status:   Bathing: Set up , stand by assist Bladder Management: Continent, Bowel Management: Continent Feeding: Independent Hygiene and Grooming: Independent, Lobbyist / Clothing: Independent Transfers:Independent, Walking: Independent  Medications  Current outpatient prescriptions:acetaminophen (TYLENOL) 325 MG tablet, Take 1-2 tablets (325-650 mg total) by mouth every 6 (six) hours as needed for pain., Disp: 60 tablet, Rfl: 1;  butalbital-acetaminophen-caffeine (FIORICET, ESGIC) 50-325-40 MG per tablet, Take 1 tablet by mouth 2 (two) times daily as needed for pain or headache., Disp: , Rfl: ;  diltiazem (CARDIZEM CD) 180 MG 24 hr capsule, Take 180 mg by mouth daily., Disp: , Rfl:  fluticasone (FLONASE) 50 MCG/ACT nasal spray, Place 2 sprays into the nose daily., Disp: , Rfl: ;  HYDROcodone-acetaminophen (NORCO/VICODIN) 5-325 MG per tablet, Take 1 tablet by mouth every 4 (four) hours as needed for pain., Disp: 180 tablet, Rfl: 0;  loratadine (CLARITIN) 10 MG tablet, Take 10 mg by mouth daily as needed for allergies. , Disp: , Rfl: ;  Melatonin 3 MG TABS, Take 1 tablet by  mouth at bedtime., Disp: , Rfl:  metoprolol tartrate (LOPRESSOR) 25 MG tablet, Take 12.5 mg by mouth 2 (two) times daily., Disp: , Rfl: ;   Multiple Vitamin (MULTIVITAMIN) capsule, Take 1 capsule by mouth daily., Disp: , Rfl: ;  nitroGLYCERIN (NITROSTAT) 0.4 MG SL tablet, Place 1 tablet (0.4 mg total) under the tongue every 5 (five) minutes x 3 doses as needed for chest pain., Disp: 25 tablet, Rfl: 12 pantoprazole (PROTONIX) 40 MG tablet, Take 1 tablet (40 mg total) by mouth daily at 12 noon., Disp: 30 tablet, Rfl: 1;  Rivaroxaban (XARELTO) 20 MG TABS, Take 20 mg by mouth daily., Disp: , Rfl: ;  sertraline (ZOLOFT) 25 MG tablet, Take 25 mg by mouth daily., Disp: , Rfl: ;  triamterene-hydrochlorothiazide (DYAZIDE) 37.5-25 MG per capsule, Take 1 capsule by mouth daily., Disp: , Rfl:  Vitamin D, Ergocalciferol, (DRISDOL) 50000 UNITS CAPS, Take 50,000 Units by mouth every 7 (seven) days., Disp: , Rfl: ;  LORazepam (ATIVAN) 1 MG tablet, Take 0.5 mg by mouth every 6 (six) hours as needed for anxiety. Take 1 tablet in the morning as needed and 2 at bedtime, Disp: , Rfl:    Data Reviewed:  Lab, [facility], external  11/16/2012: WBC 11.0, hemoglobin 11.5, hematocrit 35.7, platelets 199.  INR 2.73   glucose 111, BUN 25, creatinine 0.99, sodium 138, potassium 5.0 11/24/2012 WBC 10.4, hemoglobin 12.0, 30 hematocrit 37.0, platelets 465  Glucose 111, BUN 27, creatinine 1.03, sodium 136, potassium 3.7  TSH 0.651  Vitamin B12 280    Review of Systems:  DATA OBTAINED: from patient, nurse, medical record GENERAL: Feels well, stronger "I'm getting antsy here.."  No fevers, fatigue.Eating well at most meals, has lost weight SKIN: No itch, rash or open wounds EYES: No eye pain, dryness or itching  No change in vision EARS: No earache, tinnitus, decreased hearing NOSE: No congestion, drainage or bleeding MOUTH/THROAT: No mouth or tooth pain  No difficulty chewing or swallowing RESPIRATORY: No cough, wheezing, SOB CARDIAC: No chest pain, palpitations  No edema. GI: No abdominal pain  No N/V/D  No heartburn or reflux.  GU: No dysuria, frequency or  urgency  No change in urine volume or character  MUSCULOSKELETAL:  Less Lower back pain NEUROLOGIC: No dizziness, fainting, headache No change in mental status.  PSYCHIATRIC: No feelings of anxiety, depression. Slept well last night.  No behavior issue.   Filed Vitals:   12/10/12 1623  BP: 151/82  Pulse: 82  Temp: 97.4 F (36.3 C)  Weight: 113 lb (51.256 kg)  SpO2: 93%  Body mass index is 20.02 kg/(m^2).  Physical Exam: GENERAL APPEARANCE: No acute distress, appropriately groomed, normal body habitus. Alert, pleasant, conversant. HEAD: Normocephalic, atraumatic EYES: Conjunctiva/lids clear. Pupils round, reactive. EOMs intact.  EARS: External exam WNL, canals clear, TM WNL. HOH NOSE: No deformity or discharge. MOUTH/THROAT: Lips w/o lesions. Oral mucosa, tongue moist, w/o lesion. Oropharynx w/o redness or lesions.  NECK: Supple, full ROM. No thyroid tenderness, enlargement or nodule LYMPHATICS: No head, neck or supraclavicular adenopathy RESPIRATORY: Breathing is even, unlabored. Lung sounds are clear and full.  CARDIOVASCULAR: Heart RRR. No murmur or extra heart sounds  ARTERIAL: No carotid bruit. Carotid pulse 2+. No palpable DP pulse  VENOUS: No varicosities. No venous stasis skin changes  EDEMA: No peripheral or periorbital edema. She GASTROINTESTINAL: Abdomen is soft, non-tender, not distended w/ normal bowel sounds.  GENITOURINARY: Bladder non tender, not distended. MUSCULOSKELETAL: Moves Bil UE and Left LE with full ROM,  strength and tone. Improved strength, ROM RT.LE, No tenderness rt. Groin, medial thigh, no ecchymosis. Mild Diffuse tenderness lower back.  NEUROLOGIC: Oriented to time, place, person. Cranial nerves 2-12 grossly intact, speech clear, no tremor.  PSYCHIATRIC: Mood and affect appropriate to situation   Assessment/Plan Sacral fracture, closed Less lower back discomfort, ambulating well. Moderate use of pain medication  Pulmonary embolism, Jan 2012 No  sign of recurrent PE, anticoagulation changed to [place]. Patient is tolerating his medicine without adverse effect, will recheck CBC/BMP in July.  Long term (current) use of anticoagulants Anticoagulation changed from warfarin to Xarelto on 11/17/2012. Patient is tolerating this medication without any adverse effects, specifically no signs of bleeding. Will check CBC 12/2012.        Closed pelvic fracture Less overall discomfort with ambulation and ADLs. Patient is ready for  discharge to her independent living home. PT and OT will continue working with her at home. Patient will followup in the [facility] clinic in approximately 3 weeks.  B12 deficiency Supplements started during her rehabilitation stay due to borderline low B12 level. She's tolerating supplement well. Will recheck level at intervals.  Anemia Hemoglobin and hematocrit were improved at last measure, no sign of bleeding. Will repeat CBC  July 2014  Alcohol dependence Patient was evaluated by [name] during his rehabilitation stay. He agreed to resume daily scotch drink before dinner to aid her appetite. I discussed with patient the importance of limiting her scotch intake to less than 2 ounces daily to avoid adverse effects of unsteady gait, possible confusion, possible worsening insomnia.   Labs/tests ordered: 12/29/2012: CBC, CMP, B12  Follow Up: In clinic 3 weeks  Zayden Hahne T.Tashari Schoenfelder, NP-C 12/07/2012

## 2012-12-10 ENCOUNTER — Encounter: Payer: Self-pay | Admitting: Geriatric Medicine

## 2012-12-10 NOTE — Assessment & Plan Note (Signed)
Less lower back discomfort, ambulating well. Moderate use of pain medication

## 2012-12-10 NOTE — Assessment & Plan Note (Signed)
Patient was evaluated by [name] during his rehabilitation stay. He agreed to resume daily scotch drink before dinner to aid her appetite. I discussed with patient the importance of limiting her scotch intake to less than 2 ounces daily to avoid adverse effects of unsteady gait, possible confusion, possible worsening insomnia.

## 2012-12-10 NOTE — Assessment & Plan Note (Signed)
Supplements started during her rehabilitation stay due to borderline low B12 level. She's tolerating supplement well. Will recheck level at intervals.

## 2012-12-10 NOTE — Assessment & Plan Note (Signed)
No sign of recurrent PE, anticoagulation changed to [place]. Patient is tolerating his medicine without adverse effect, will recheck CBC/BMP in July.

## 2012-12-10 NOTE — Assessment & Plan Note (Signed)
Anticoagulation changed from warfarin to Xarelto on 11/17/2012. Patient is tolerating this medication without any adverse effects, specifically no signs of bleeding. Will check CBC 12/2012.

## 2012-12-10 NOTE — Assessment & Plan Note (Signed)
Less overall discomfort with ambulation and ADLs. Patient is ready for  discharge to her independent living home. PT and OT will continue working with her at home. Patient will followup in the [facility] clinic in approximately 3 weeks.

## 2012-12-10 NOTE — Assessment & Plan Note (Signed)
Hemoglobin and hematocrit were improved at last measure, no sign of bleeding. Will repeat CBC  July 2014

## 2012-12-16 ENCOUNTER — Other Ambulatory Visit: Payer: Self-pay | Admitting: Geriatric Medicine

## 2012-12-16 ENCOUNTER — Other Ambulatory Visit: Payer: Self-pay

## 2012-12-16 MED ORDER — LORAZEPAM 1 MG PO TABS: 0.5000 mg | ORAL_TABLET | Freq: Four times a day (QID) | ORAL | Status: DC | PRN

## 2012-12-28 ENCOUNTER — Encounter: Payer: Self-pay | Admitting: *Deleted

## 2012-12-30 ENCOUNTER — Encounter: Payer: Self-pay | Admitting: Geriatric Medicine

## 2012-12-30 ENCOUNTER — Non-Acute Institutional Stay: Payer: Medicare Other | Admitting: Geriatric Medicine

## 2012-12-30 VITALS — BP 150/68 | HR 60 | Ht 63.0 in | Wt 113.5 lb

## 2012-12-30 DIAGNOSIS — F419 Anxiety disorder, unspecified: Secondary | ICD-10-CM

## 2012-12-30 DIAGNOSIS — S81809A Unspecified open wound, unspecified lower leg, initial encounter: Secondary | ICD-10-CM

## 2012-12-30 DIAGNOSIS — S329XXD Fracture of unspecified parts of lumbosacral spine and pelvis, subsequent encounter for fracture with routine healing: Secondary | ICD-10-CM

## 2012-12-30 DIAGNOSIS — S81802A Unspecified open wound, left lower leg, initial encounter: Secondary | ICD-10-CM

## 2012-12-30 DIAGNOSIS — F411 Generalized anxiety disorder: Secondary | ICD-10-CM | POA: Insufficient documentation

## 2012-12-30 DIAGNOSIS — IMO0001 Reserved for inherently not codable concepts without codable children: Secondary | ICD-10-CM

## 2012-12-30 DIAGNOSIS — I10 Essential (primary) hypertension: Secondary | ICD-10-CM

## 2012-12-30 DIAGNOSIS — D649 Anemia, unspecified: Secondary | ICD-10-CM

## 2012-12-30 DIAGNOSIS — E559 Vitamin D deficiency, unspecified: Secondary | ICD-10-CM

## 2012-12-30 DIAGNOSIS — E538 Deficiency of other specified B group vitamins: Secondary | ICD-10-CM

## 2012-12-30 NOTE — Assessment & Plan Note (Signed)
Most recent vitamin D level well within normal range, patient may stop the supplement.

## 2012-12-30 NOTE — Assessment & Plan Note (Signed)
Level improved with daily dose of B12 supplement, can reduce to weekly dosing

## 2012-12-30 NOTE — Assessment & Plan Note (Signed)
BP stable, lab satisfactory. Continue current medications, repeat lab at intervals.

## 2012-12-30 NOTE — Assessment & Plan Note (Signed)
Patient notes nerves and anxiety are her biggest problem. She also reports she feels depressed and worries about her family a lot. No specific family issues are occurring at this time. She has been taking Zoloft for quite some time at low dose, will increase to 50 mg and change time of dosing to bedtime. This may help her with sleep as well as anxiety

## 2012-12-30 NOTE — Assessment & Plan Note (Signed)
Pt. Reports significantly less back pian. Continue prn dosing of Vicodin

## 2012-12-30 NOTE — Assessment & Plan Note (Signed)
H/H is stable. No sign of active bleeding. Follow CBC at intervals (anticoagulaiton w/ Xarelto)

## 2012-12-30 NOTE — Assessment & Plan Note (Signed)
Left lateral lower leg wound approximately 28 or 5 days old. Wound edges a bit macerated, send her home with will have nurse do every other day dressing changes, reevaluate healing in one week.

## 2012-12-30 NOTE — Progress Notes (Signed)
Patient ID: Cathy Wallace, female   DOB: [birth year], 79 y.o.   MRN: [id number] [facility] [phone])  Chief Complaint  Patient presents with  . Medical Managment of Chronic Issues    follow-up after discharged 12/07/12 from Rehab  . Right pubic fracture after fall on 11/10/2012    HPI: This is a 79 y.o. female resident of [name], Independent Living section evaluated today for management of ongoing medical issues.  She was discharged from the rehabilitation section of [facility] on 12/07/2012 after recovering from pubic and sacral fractures.  Patient has been experiencing less pain, is taking up to 3 Vicodin daily this is reduced from one every 4 hours. Worse sleeping okay, some nights better than others. Does feel like nerves and anxiety are her major problem, feels a little bit depressed and worries about her family. Patient reports she is drinking only 2 ounces of alcohol daily, reports this helps her to relax. Review of recent lab studies show that her anemia is stable, vitamin B12 and vitamin D are in satisfactory ranges.  Patient injured her leg last week, clinic nurse has been doing dressing changes every few days and is concerned about the way the wound appears today.  Allergies  Allergies  Allergen Reactions  . Aspirin   . Benadryl (Diphenhydramine Hcl)   . Codeine Nausea And Vomiting  . Flu Virus Vaccine   . Pneumococcal Vaccines   . Penicillins Rash   Medications  Current outpatient prescriptions:acetaminophen (TYLENOL) 325 MG tablet, Take 1-2 tablets (325-650 mg total) by mouth every 6 (six) hours as needed for pain., Disp: 60 tablet, Rfl: 1;  aspirin 81 MG tablet, Take 81 mg by mouth daily. Take 1 tablet daily to help prevent heart attack and stroke., Disp: , Rfl:  butalbital-acetaminophen-caffeine (FIORICET, ESGIC) 50-325-40 MG per tablet, Take 1 tablet by mouth 2 (two) times daily as needed for pain or headache., Disp: , Rfl: ;   diltiazem (CARDIZEM CD) 180 MG 24 hr capsule, Take 180 mg by mouth daily., Disp: , Rfl: ;  fluticasone (FLONASE) 50 MCG/ACT nasal spray, Place 2 sprays into the nose daily., Disp: , Rfl:  gabapentin (NEURONTIN) 300 MG capsule, Take 300 mg by mouth 3 (three) times daily. Take 1 capsule three times daily as needed for chronic back pain., Disp: , Rfl: ;  HYDROcodone-acetaminophen (NORCO/VICODIN) 5-325 MG per tablet, Take 1 tablet by mouth every 4 (four) hours as needed for pain., Disp: 180 tablet, Rfl: 0;  loratadine (CLARITIN) 10 MG tablet, Take 10 mg by mouth daily as needed for allergies. , Disp: , Rfl:  LORazepam (ATIVAN) 1 MG tablet, Take 0.5 tablets (0.5 mg total) by mouth every 6 (six) hours as needed for anxiety. Take 1 tablet in the morning as needed and 2 at bedtime, Disp: 120 tablet, Rfl: 5;  Melatonin 3 MG TABS, Take 1 tablet by mouth at bedtime., Disp: , Rfl: ;  metoprolol tartrate (LOPRESSOR) 25 MG tablet, Take 12.5 mg by mouth 2 (two) times daily. Take 1/2 tablet twice daily for blood pressure mangement., Disp: , Rfl:  Multiple Vitamin (MULTIVITAMIN) capsule, Take 1 capsule by mouth daily., Disp: , Rfl: ;  nitroGLYCERIN (NITROSTAT) 0.4 MG SL tablet, Place 1 tablet (0.4 mg total) under the tongue every 5 (five) minutes x 3 doses as needed for chest pain., Disp: 25 tablet, Rfl: 12;  pantoprazole (PROTONIX) 40 MG tablet, Take 1 tablet (40 mg total) by mouth daily at 12 noon., Disp: 30 tablet, Rfl:  1 Rivaroxaban (XARELTO) 20 MG TABS, Take 20 mg by mouth daily., Disp: , Rfl: ;  sertraline (ZOLOFT) 25 MG tablet, Take 25 mg by mouth daily. Take 1 tablet daily to treat anxiety and depression., Disp: , Rfl: ;  simvastatin (ZOCOR) 20 MG tablet, Take 20 mg by mouth every evening. Take 1 tablet daily to treat hyperlipidemia., Disp: , Rfl:  triamterene-hydrochlorothiazide (DYAZIDE) 37.5-25 MG per capsule, Take 1 capsule by mouth daily. Take 1 capsule  daily for high blood pressure., Disp: , Rfl: ;  Vitamin D,  Ergocalciferol, (DRISDOL) 50000 UNITS CAPS, Take 50,000 Units by mouth every 7 (seven) days. Take 1 vitamin D capsule for vitamin d supplement., Disp: , Rfl:   Data Reviewed    Radiology:   [id number], external  11/16/2012: WBC 11.0, hemoglobin 11.5, hematocrit 35.7, platelets 199.   INR 2.73   glucose 111, BUN 25, creatinine 0.99, sodium 138, potassium 5.0  11/24/2012 WBC 10.4, hemoglobin 12.0, 30 hematocrit 37.0, platelets 465   Glucose 111, BUN 27, creatinine 1.03, sodium 136, potassium 3.7   TSH 0.651   Vitamin B12 280 12/29/2012 WBC 7.5, hemoglobin 11.8, hematocrit 35.8, platelets 439  Glucose 114, BUN 20, creatinine 1.23, sodium 138, potassium 3.4. LFTs/proteins WNL.  B12 786  Vit D 51  Review of Systems  DATA OBTAINED: from patient, GENERAL: Feels well, tires easily  No fevers, change in appetite or weight SKIN: No itch, rash. Wound left lower leg EYES: No eye pain, dryness or itching  No change in vision EARS: No earache, tinnitus, change in hearing NOSE: No congestion, drainage or bleeding MOUTH/THROAT: No mouth or tooth pain  No sore throat No difficulty chewing or swallowing RESPIRATORY: No cough, wheezing, SOB CARDIAC: No chest pain, palpitations  No edema. GI: No abdominal pain  No N/V/D or constipation  No heartburn or reflux  GU: No dysuria, frequency or urgency  No change in urine volume or character  urinary incontinence present, wears pads MUSCULOSKELETAL: No joint pain, swelling or stiffness  Back pain  No muscle ache, pain, weakness  Gait is steady  No recent falls.  NEUROLOGIC: No dizziness, fainting, headache  No change in mental status.  PSYCHIATRIC: See HPI- mild anxiety, depression Sleeps well.  No behavior issue.   Physical Exam Filed Vitals:   12/30/12 1306  BP: 150/68  Pulse: 60  Height: 5\' 3"  (1.6 m)  Weight: 113 lb 8 oz (51.483 kg)   Body mass index is 20.11 kg/(m^2).  GENERAL APPEARANCE: No acute distress, appropriately groomed, Frail body  habitus. Alert, pleasant, conversant. SKIN: Left lateral lower leg wound is full thickness center section with what appears to be nonviable skin patch. Wound base is clean no surrounding erythema or edema.  HEAD: Normocephalic, atraumatic EYES: Conjunctiva/lids clear. Pupils round, reactive. Marland Kitchen  EARS: Decreased  Hearing . NOSE: No deformity or discharge. MOUTH/THROAT: Lips w/o lesions. Oral mucosa, tongue moist, w/o lesion. Oropharynx w/o redness or lesions.  NECK: Supple, full ROM. No thyroid tenderness, enlargement or nodule LYMPHATICS: No head, neck or supraclavicular adenopathy RESPIRATORY: Breathing is even, unlabored. Lung sounds are clear and full.  CARDIOVASCULAR: Heart RRR. No murmur or extra heart sounds  EDEMA: No peripheral  edema.  GASTROINTESTINAL: Abdomen is soft, non-tender, not distended w/ normal bowel sounds. No hepatic or splenic enlargement. No mass, ventral or inguinal hernia. MUSCULOSKELETAL: Moves all extremities with full ROM, strength and tone. Back is without kyphosis, scoliosis or spinal process tenderness. Gait is steady NEUROLOGIC: Oriented to time, place, person.  Speech clear, no tremor.  PSYCHIATRIC: Mood and affect appropriate to situation  ASSESSMENT/PLAN  Anemia H/H is stable. No sign of active bleeding. Follow CBC at intervals (anticoagulaiton w/ Xarelto)  B12 deficiency Level improved with daily dose of B12 supplement, can reduce to weekly dosing  Closed pelvic fracture Pt. Reports significantly less back pian. Continue prn dosing of Vicodin  HTN (hypertension) BP stable, lab satisfactory. Continue current medications, repeat lab at intervals.  Leg wound, left Left lateral lower leg wound approximately 35 or 46 days old. Wound edges a bit macerated, send her home with will have nurse do every other day dressing changes, reevaluate healing in one week.  Unspecified vitamin D deficiency Most recent vitamin D level well within normal range, patient may  stop the supplement.  Anxiety Patient notes nerves and anxiety are her biggest problem. She also reports she feels depressed and worries about her family a lot. No specific family issues are occurring at this time. She has been taking Zoloft for quite some time at low dose, will increase to 50 mg and change time of dosing to bedtime. This may help her with sleep as well as anxiety    Follow up: 1 week  Kynnedy Carreno T.Neida Ellegood, NP-C 12/30/2012

## 2013-01-05 MED ORDER — SERTRALINE HCL 25 MG PO TABS: 50.0000 mg | ORAL_TABLET | Freq: Every day | ORAL | Status: DC

## 2013-01-06 ENCOUNTER — Encounter: Payer: Self-pay | Admitting: Geriatric Medicine

## 2013-01-06 ENCOUNTER — Non-Acute Institutional Stay: Payer: Medicare Other | Admitting: Geriatric Medicine

## 2013-01-06 VITALS — BP 152/62 | HR 72 | Temp 97.2°F

## 2013-01-06 DIAGNOSIS — Z5189 Encounter for other specified aftercare: Secondary | ICD-10-CM

## 2013-01-06 DIAGNOSIS — S81802D Unspecified open wound, left lower leg, subsequent encounter: Secondary | ICD-10-CM

## 2013-01-06 NOTE — Assessment & Plan Note (Signed)
Skin at center of wound necrotic, debrided  Today. Wound edges dry. Dress with Recruitment consultant. Dressing change QOD.
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
## 2013-01-13 ENCOUNTER — Encounter: Payer: Self-pay | Admitting: Geriatric Medicine

## 2013-01-13 ENCOUNTER — Non-Acute Institutional Stay: Payer: Medicare Other | Admitting: Geriatric Medicine

## 2013-01-13 VITALS — BP 144/62 | HR 62 | Ht 63.0 in | Wt 112.0 lb

## 2013-01-13 DIAGNOSIS — Z5189 Encounter for other specified aftercare: Secondary | ICD-10-CM

## 2013-01-13 DIAGNOSIS — S81802D Unspecified open wound, left lower leg, subsequent encounter: Secondary | ICD-10-CM

## 2013-01-13 NOTE — Assessment & Plan Note (Signed)
Leg wound is improving, skin edges are contracting wound base is with granulation tissue. Decreased dressing changes to Monday Wednesday Friday. Will reassess wound in 2 weeks

## 2013-01-13 NOTE — Progress Notes (Signed)
Patient ID: Cathy Wallace, female   DOB: 1916-09-14, 79 y.o.   MRN: [id number] [facility] [id number])  Chief Complaint  Patient presents with  . Wound Check    recheck left leg wound    HPI: This is a 79 y.o. female resident of [name] section returns to clinic today for re-evaluation of left leg wound. Patient injured her leg 3 weeks ago. At visit last week, wound was clean, with evidence of healing. Clinic nurse has been changing dressing every other day.  Allergies  Allergies  Allergen Reactions  . Aspirin   . Benadryl (Diphenhydramine Hcl)   . Codeine Nausea And Vomiting  . Flu Virus Vaccine   . Pneumococcal Vaccines   . Penicillins Rash      Medication List       This list is accurate as of: 01/13/13  3:28 PM.  Always use your most recent med list.               acetaminophen 325 MG tablet  Commonly known as:  TYLENOL  Take 1-2 tablets (325-650 mg total) by mouth every 6 (six) hours as needed for pain.     butalbital-acetaminophen-caffeine 50-325-40 MG per tablet  Commonly known as:  FIORICET, ESGIC  Take 1 tablet by mouth 2 (two) times daily as needed for pain or headache.     diltiazem 180 MG 24 hr capsule  Commonly known as:  CARDIZEM CD  Take 180 mg by mouth daily.     fluticasone 50 MCG/ACT nasal spray  Commonly known as:  FLONASE  Place 2 sprays into the nose daily.     gabapentin 300 MG capsule  Commonly known as:  NEURONTIN  Take 300 mg by mouth 3 (three) times daily. Take 1 capsule three times daily as needed for chronic back pain.     HYDROcodone-acetaminophen 5-325 MG per tablet  Commonly known as:  NORCO/VICODIN  Take 1 tablet by mouth every 4 (four) hours as needed for pain.     loratadine 10 MG tablet  Commonly known as:  CLARITIN  Take 10 mg by mouth daily as needed for allergies.     LORazepam 1 MG tablet  Commonly known as:  ATIVAN  Take 0.5 tablets (0.5 mg total) by  mouth every 6 (six) hours as needed for anxiety. Take 1 tablet in the morning as needed and 2 at bedtime     Melatonin 3 MG Tabs  Take 1 tablet by mouth at bedtime.     metoprolol tartrate 25 MG tablet  Commonly known as:  LOPRESSOR  Take 12.5 mg by mouth 2 (two) times daily. Take 1/2 tablet twice daily for blood pressure mangement.     multivitamin capsule  Take 1 capsule by mouth daily.     nitroGLYCERIN 0.4 MG SL tablet  Commonly known as:  NITROSTAT  Place 1 tablet (0.4 mg total) under the tongue every 5 (five) minutes x 3 doses as needed for chest pain.     pantoprazole 40 MG tablet  Commonly known as:  PROTONIX  Take 1 tablet (40 mg total) by mouth daily at 12 noon.     sertraline 25 MG tablet  Commonly known as:  ZOLOFT  Take 2 tablets (50 mg total) by mouth at bedtime. Take 1 tablet daily to treat anxiety and depression.     simvastatin 20 MG tablet  Commonly known as:  ZOCOR  Take 20 mg by mouth every evening.  Take 1 tablet daily to treat hyperlipidemia.     triamterene-hydrochlorothiazide 37.5-25 MG per capsule  Commonly known as:  DYAZIDE  Take 1 capsule by mouth daily. Take 1 capsule  daily for high blood pressure.     XARELTO 20 MG Tabs  Generic drug:  Rivaroxaban  Take 20 mg by mouth daily.       Data Reviewed       ZOX:WRUEAVW, external  11/16/2012: WBC 11.0, hemoglobin 11.5, hematocrit 35.7, platelets 199.   INR 2.73   glucose 111, BUN 25, creatinine 0.99, sodium 138, potassium 5.0  11/24/2012 WBC 10.4, hemoglobin 12.0, 30 hematocrit 37.0, platelets 465   Glucose 111, BUN 27, creatinine 1.03, sodium 136, potassium 3.7   TSH 0.651   Vitamin B12 280 12/29/2012 WBC 7.5, hemoglobin 11.8, hematocrit 35.8, platelets 439  Glucose 114, BUN 20, creatinine 1.23, sodium 138, potassium 3.4. LFTs/proteins WNL.  B12 786  Vit D 51  Review of Systems  DATA OBTAINED: from patient, GENERAL: Feels well, tires easily  No fevers, change in appetite or weight SKIN:  No itch, rash. Wound left lower leg RESPIRATORY: No cough, wheezing, SOB CARDIAC: No chest pain, palpitations  No edema. MUSCULOSKELETAL: No joint pain, swelling or stiffness  Back pain  No muscle ache, pain, weakness  Gait is steady  No recent falls.  NEUROLOGIC: No dizziness, fainting, headache  No change in mental status.  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 Alert, pleasant, conversant. SKIN: Left lateral lower leg wound, less than 3 x 3 cm, full thickness center section. Wound edges are flat, beginning to contract. No surrounding erythema. Wound cleaned,  Wound edges covered with thin layer of mupirocin ointment, dressed with Mepilex Border Lite. HEAD: Normocephalic, atraumatic EYES: Conjunctiva/lids clear. Pupils round, reactive. Marland Kitchen  EARS: Significantly Decreased  Hearing (aides are broken) . RESPIRATORY: Breathing is even, unlabored. MUSCULOSKELETAL:  Gait is steady NEUROLOGIC: Oriented to time, place, person. Speech clear, no tremor.  PSYCHIATRIC: Very anxious  ASSESSMENT/PLAN  Leg wound, left Leg wound is improving, skin edges are contracting wound base is with granulation tissue. Decreased dressing changes to Monday Wednesday Friday. Will reassess wound in 2 weeks   Follow up: 2 weeks  Liya Strollo T.Abdulla Pooley, NP-C 01/13/2013

## 2013-01-21 ENCOUNTER — Encounter: Payer: Self-pay | Admitting: Geriatric Medicine

## 2013-01-26 ENCOUNTER — Other Ambulatory Visit: Payer: Self-pay | Admitting: Geriatric Medicine

## 2013-01-26 MED ORDER — HYDROCODONE-ACETAMINOPHEN 5-325 MG PO TABS: 1.0000 | ORAL_TABLET | ORAL | Status: DC | PRN

## 2013-01-27 ENCOUNTER — Non-Acute Institutional Stay: Payer: Medicare Other | Admitting: Geriatric Medicine

## 2013-01-27 ENCOUNTER — Encounter: Payer: Self-pay | Admitting: Geriatric Medicine

## 2013-01-27 VITALS — BP 140/64 | HR 76 | Ht 63.0 in | Wt 112.0 lb

## 2013-01-27 DIAGNOSIS — Z66 Do not resuscitate: Secondary | ICD-10-CM

## 2013-01-27 DIAGNOSIS — S81802D Unspecified open wound, left lower leg, subsequent encounter: Secondary | ICD-10-CM

## 2013-01-27 DIAGNOSIS — Z5189 Encounter for other specified aftercare: Secondary | ICD-10-CM

## 2013-02-01 NOTE — Assessment & Plan Note (Signed)
Left lower extremity wound is healing very well, now superficial. Continue every other day dressing changes with the clinic nurse. Anticipate this wound will heal without complication.
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
## 2013-02-02 ENCOUNTER — Telehealth: Payer: Self-pay

## 2013-02-02 NOTE — Telephone Encounter (Signed)
Received letter from [facility] for prior authorization for Xarelto. Called [phone], received authorization for 2 months 03-09-2013, then will have to call back again. Left message to patient that she will be able to pick up her medication, and about the authorization. Roberts Gaudy, CMA

## 2013-02-10 ENCOUNTER — Non-Acute Institutional Stay: Payer: Medicare Other | Admitting: Geriatric Medicine

## 2013-02-10 ENCOUNTER — Encounter: Payer: Self-pay | Admitting: Geriatric Medicine

## 2013-02-10 VITALS — BP 142/62 | HR 68 | Ht 63.0 in | Wt 111.0 lb

## 2013-02-10 DIAGNOSIS — M4800 Spinal stenosis, site unspecified: Secondary | ICD-10-CM

## 2013-02-10 DIAGNOSIS — Z5189 Encounter for other specified aftercare: Secondary | ICD-10-CM

## 2013-02-10 DIAGNOSIS — M1990 Unspecified osteoarthritis, unspecified site: Secondary | ICD-10-CM | POA: Insufficient documentation

## 2013-02-10 DIAGNOSIS — S81802D Unspecified open wound, left lower leg, subsequent encounter: Secondary | ICD-10-CM

## 2013-02-10 HISTORY — DX: Unspecified osteoarthritis, unspecified site: M19.90

## 2013-02-10 NOTE — Assessment & Plan Note (Signed)
Wound continues slow healing process.

## 2013-02-10 NOTE — Progress Notes (Signed)
Patient ID: Cathy Wallace, female   DOB: June 26, 1917, 79 y.o.   MRN: [id number] [facility] [phone])  Code Status: DNR  Contact Information   Name Relation Home Work [employer] Other [phone]    Nadel,Beverly Daughter [phone]  [phone]   Cardenas,Sherry Other [phone]        Chief Complaint  Patient presents with  . Shoulder Pain    left for one week, new. The right shoulder pain has gotten better with therapy, but starting to hurt again.  Back pain gots down right leg, pain medications helps. Eyes swollen.     HPI: This is a 79 y.o. female resident of [name] section. She returns to clinic today due to complaints of "just not feeling well", left shoulder pain , back and right leg pain. Patient feels this pain is getting her down making her feel depressed. Patient describes left shoulder pain as very bad. Worse when she lifts her arms. Notes she has been doing more household activities lately since her long-term caregiver is ill. Activities include making the bed and pulling wet laundry out of the washing machine. Back pain is in the lower right back and extends to the buttock and down the right posterior leg. She denies any numbness or tingling in the leg, no new urinary incontinence (has chronic urine dribbling), no fecal incontinence, no falls. Patient has been taking hydrocodone several times a day with good results. She reports that this medicine really reduces the pain quickly and last for many hours. Patient is taking 3-4 tablets daily.   Allergies  Allergen Reactions  . Aspirin   . Benadryl [Diphenhydramine Hcl]   . Codeine Nausea And Vomiting  . Flu Virus Vaccine   . Pneumococcal Vaccines   . Penicillins Rash      Medication List       This list is accurate as of: 02/10/13  4:48 PM.  Always use your most recent med list.               acetaminophen 325 MG  tablet  Commonly known as:  TYLENOL  Take 650 mg by mouth every 4 (four) hours as needed for pain. Take 2 tablets as needed for breakthrough pain. No more than 6 tablets in 1 day     diltiazem 180 MG 24 hr capsule  Commonly known as:  CARDIZEM CD  Take 180 mg by mouth daily.     fluticasone 50 MCG/ACT nasal spray  Commonly known as:  FLONASE  Place 2 sprays into the nose daily.     gabapentin 300 MG capsule  Commonly known as:  NEURONTIN  Take 300 mg by mouth 3 (three) times daily. Take 1 capsule three times daily as needed for chronic back pain.     HYDROcodone-acetaminophen 5-325 MG per tablet  Commonly known as:  NORCO/VICODIN  Take 1 tablet by mouth 3 (three) times daily. Take 1 tablet in morning, mid-afternoon, at bedtime for back pain     loratadine 10 MG tablet  Commonly known as:  CLARITIN  Take 10 mg by mouth daily as needed for allergies.     LORazepam 1 MG tablet  Commonly known as:  ATIVAN  Take 0.5 tablets (0.5 mg total) by mouth every 6 (six) hours as needed for anxiety. Take 1 tablet in the morning as needed and 2 at bedtime     Melatonin 3 MG Tabs  Take 1 tablet by mouth at  bedtime.     metoprolol tartrate 25 MG tablet  Commonly known as:  LOPRESSOR  Take 12.5 mg by mouth 2 (two) times daily. Take 1/2 tablet twice daily for blood pressure mangement.     nitroGLYCERIN 0.4 MG SL tablet  Commonly known as:  NITROSTAT  Place 1 tablet (0.4 mg total) under the tongue every 5 (five) minutes x 3 doses as needed for chest pain.     pantoprazole 40 MG tablet  Commonly known as:  PROTONIX  Take 1 tablet (40 mg total) by mouth daily at 12 noon.     sertraline 25 MG tablet  Commonly known as:  ZOLOFT  Take 2 tablets (50 mg total) by mouth at bedtime. Take 1 tablet daily to treat anxiety and depression.     simvastatin 20 MG tablet  Commonly known as:  ZOCOR  Take 20 mg by mouth every evening. Take 1 tablet daily to treat hyperlipidemia.      triamterene-hydrochlorothiazide 37.5-25 MG per capsule  Commonly known as:  DYAZIDE  Take 1 capsule by mouth daily. Take 1 capsule  daily for high blood pressure.     XARELTO 20 MG Tabs tablet  Generic drug:  Rivaroxaban  Take 20 mg by mouth daily.       Data Reviewed       EAV:[id number], external  11/16/2012: WBC 11.0, hemoglobin 11.5, hematocrit 35.7, platelets 199.   INR 2.73   glucose 111, BUN 25, creatinine 0.99, sodium 138, potassium 5.0  11/24/2012 WBC 10.4, hemoglobin 12.0, 30 hematocrit 37.0, platelets 465   Glucose 111, BUN 27, creatinine 1.03, sodium 136, potassium 3.7   TSH 0.651   Vitamin B12 280 12/29/2012 WBC 7.5, hemoglobin 11.8, hematocrit 35.8, platelets 439  Glucose 114, BUN 20, creatinine 1.23, sodium 138, potassium 3.4. LFTs/proteins WNL.  B12 786  Vit D 51  Review of Systems  DATA OBTAINED: from patient, GENERAL:  Tires easily  No fevers, change in appetite or weight SKIN: No itch, rash. Wound left lower leg healing well, nurse changes dressing every few days RESPIRATORY: No cough, wheezing, SOB CARDIAC: No chest pain, palpitations  No edema. MUSCULOSKELETAL: SEE HPI Gait is unsteady, uses walker AAT  No recent falls.  NEUROLOGIC: No dizziness, fainting, headache  No change in mental status.  PSYCHIATRIC: "Feeling down..probably from this pain..."   Physical Exam Filed Vitals:   02/10/13 1103  BP: 142/62  Pulse: 68  Height: 5\' 3"  (1.6 m)  Weight: 111 lb (50.349 kg)  Body mass index is 19.67 kg/(m^2).  GENERAL APPEARANCE: No acute distress, appropriately groomed, Frail body habitus. Alert, pleasant, conversant. SKIN: Left lateral lower leg wound, < 2 x 2 cm, superficial.  HEAD: Normocephalic, atraumatic EYES: Conjunctiva/lids clear. Pupils round, reactive. Marland Kitchen  EARS: Significantly Decreased  Hearing (aides are broken)  NOSE: No deformity or discharge. MOUTH/THROAT: Lips w/o lesions. Oral mucosa, tongue moist, w/o lesion. Oropharynx w/o redness or  lesions.  NECK: Supple, full ROM. No thyroid tenderness, enlargement or nodule LYMPHATICS: No head, neck or supraclavicular adenopathy RESPIRATORY: Breathing is even, unlabored. Lung sounds are clear and full.  CARDIOVASCULAR: Heart RRR. No murmur or extra heart sounds  ARTERIAL: No carotid bruit.   VENOUS: No varicosities. No venous stasis skin changes  EDEMA: No peripheral edema.  MUSCULOSKELETAL:  Passive movement of bilateral UE full ROM. Active range of motion of left shoulder is limited in extension.  Back is without kyphosis, scoliosis or spinal process tenderness. Gait is unsteady NEUROLOGIC: Oriented to time,  place, person. Speech clear, no tremor.  PSYCHIATRIC: Less anxious than last visit  ASSESSMENT/PLAN  Spinal stenosis Right lower back pain that extends into right leg is likely due to spinal stenosis. No numbness or tingling in the leg no GI or GU issues. Patient is getting decent relief with hydrocodone, have recommended a scheduled dosing pattern 3 times a day with Tylenol in between if needed.  Osteoarthritis Shoulders and hips with significant crepitus, pain with movement; indicative of arthritis. Right shoulder pain responded nicely to occupational therapy interventions, recommend these interventions for the left shoulder.  Leg wound, left Wound continues slow healing process.   Follow up:  As scheduled  Dunya Meiners T.Naziyah Tieszen, NP-C 02/10/2013

## 2013-02-10 NOTE — Assessment & Plan Note (Signed)
Right lower back pain that extends into right leg is likely due to spinal stenosis. No numbness or tingling in the leg no GI or GU issues. Patient is getting decent relief with hydrocodone, have recommended a scheduled dosing pattern 3 times a day with Tylenol in between if needed.

## 2013-02-10 NOTE — Assessment & Plan Note (Signed)
Shoulders and hips with significant crepitus, pain with movement; indicative of arthritis. Right shoulder pain responded nicely to occupational therapy interventions, recommend these interventions for the left shoulder.

## 2013-04-28 ENCOUNTER — Encounter: Payer: Self-pay | Admitting: Geriatric Medicine

## 2013-04-28 ENCOUNTER — Non-Acute Institutional Stay: Payer: Medicare Other | Admitting: Geriatric Medicine

## 2013-04-28 VITALS — BP 112/62 | HR 64 | Ht 63.0 in | Wt 112.0 lb

## 2013-04-28 DIAGNOSIS — D649 Anemia, unspecified: Secondary | ICD-10-CM

## 2013-04-28 DIAGNOSIS — F411 Generalized anxiety disorder: Secondary | ICD-10-CM

## 2013-04-28 DIAGNOSIS — E876 Hypokalemia: Secondary | ICD-10-CM

## 2013-04-28 DIAGNOSIS — M4800 Spinal stenosis, site unspecified: Secondary | ICD-10-CM

## 2013-04-28 DIAGNOSIS — I2510 Atherosclerotic heart disease of native coronary artery without angina pectoris: Secondary | ICD-10-CM

## 2013-04-28 DIAGNOSIS — Z7901 Long term (current) use of anticoagulants: Secondary | ICD-10-CM

## 2013-04-28 MED ORDER — SERTRALINE HCL 50 MG PO TABS: 50.0000 mg | ORAL_TABLET | Freq: Every day | ORAL | Status: DC

## 2013-04-28 NOTE — Assessment & Plan Note (Signed)
Continues Xarelto, no adverse effects, lab satisfactory.

## 2013-04-28 NOTE — Progress Notes (Signed)
Patient ID: Cathy Wallace, female   DOB: [birth year], 79 y.o.   MRN: [id number] [facility] [phone])  Code Status: DNR      Contact Information   Name Relation Home Work [employer] Other [phone]    Nadel,Beverly Daughter [phone]  [phone]   Lapka,Sherry Other [phone]        Chief Complaint  Patient presents with  . Medical Managment of Chronic Issues    osteoarthritis, insomnia, anxiety " very nervous more so then usual"     HPI: This is a 79 y.o. female resident of [name]  section evaluated today for management of ongoing medical issues.  Patient tells me her main problem is ''nerves.Marland KitchenMarland KitchenI'm just a mess...driving my family crazy...". Her long time caregiver/ companion, Shirlee Limerick, is no longer able to work. Sh ehas been supplementing with [employer] caregivers, but not on a regular basis. Did not increase setraline dose as recommended in July. Is taking prn doses lorazepam more often.  Back pianis much improved, taking hydrocodone  Only "once in a while".  Last visit: Spinal stenosis Right lower back pain that extends into right leg is likely due to spinal stenosis. No numbness or tingling in the leg no GI or GU issues. Patient is getting decent relief with hydrocodone, have recommended a scheduled dosing pattern 3 times a day with Tylenol in between if needed.  Osteoarthritis Shoulders and hips with significant crepitus, pain with movement; indicative of arthritis. Right shoulder pain responded nicely to occupational therapy interventions, recommend these interventions for the left shoulder.  Leg wound, left Wound continues slow healing process.   Allergies  Allergen Reactions  . Aspirin   . Benadryl [Diphenhydramine Hcl]   . Codeine Nausea And Vomiting  . Flu Virus Vaccine   . Pneumococcal Vaccines   . Penicillins Rash      Medication List       This list is  accurate as of: 04/28/13 12:49 PM.  Always use your most recent med list.               diltiazem 180 MG 24 hr capsule  Commonly known as:  CARDIZEM CD  Take 180 mg by mouth daily.     fluticasone 50 MCG/ACT nasal spray  Commonly known as:  FLONASE  Place 2 sprays into the nose daily.     HYDROcodone-acetaminophen 5-325 MG per tablet  Commonly known as:  NORCO/VICODIN  Take 1 tablet by mouth every 6 (six) hours as needed. Take 1 tablet in morning, mid-afternoon, at bedtime for back pain     loratadine 10 MG tablet  Commonly known as:  CLARITIN  Take 10 mg by mouth daily as needed for allergies.     LORazepam 1 MG tablet  Commonly known as:  ATIVAN  Take 0.5 tablets (0.5 mg total) by mouth every 6 (six) hours as needed for anxiety. Take 1 tablet in the morning as needed and 2 at bedtime     Melatonin 3 MG Tabs  Take 1 tablet by mouth at bedtime.     metoprolol tartrate 25 MG tablet  Commonly known as:  LOPRESSOR  Take 12.5 mg by mouth 2 (two) times daily. Take 1/2 tablet twice daily for blood pressure mangement.     nitroGLYCERIN 0.4 MG SL tablet  Commonly known as:  NITROSTAT  Place 1 tablet (0.4 mg total) under the tongue every 5 (five) minutes x 3 doses as needed for chest pain.  pantoprazole 40 MG tablet  Commonly known as:  PROTONIX  Take 1 tablet (40 mg total) by mouth daily at 12 noon.     sertraline 50 MG tablet  Commonly known as:  ZOLOFT  Take 1 tablet (50 mg total) by mouth daily.     triamterene-hydrochlorothiazide 37.5-25 MG per capsule  Commonly known as:  DYAZIDE  Take 1 capsule by mouth daily. Take 1 capsule  daily for high blood pressure.     XARELTO 20 MG Tabs tablet  Generic drug:  Rivaroxaban  Take 20 mg by mouth daily.       Data Reviewed       NWG:NFAOZHY, external  11/16/2012: WBC 11.0, hemoglobin 11.5, hematocrit 35.7, platelets 199.   INR 2.73   glucose 111, BUN 25, creatinine 0.99, sodium 138, potassium 5.0  11/24/2012 WBC 10.4,  hemoglobin 12.0, 30 hematocrit 37.0, platelets 465   Glucose 111, BUN 27, creatinine 1.03, sodium 136, potassium 3.7   TSH 0.651   Vitamin B12 280 12/29/2012 WBC 7.5, hemoglobin 11.8, hematocrit 35.8, platelets 439  Glucose 114, BUN 20, creatinine 1.23, sodium 138, potassium 3.4. LFTs/proteins WNL.  B12 786  Vit D 51 04/26/2013 WBC 8.4, Hgb 13.2, Hct 39.7, Plt 330  Glu 127, BUN 21, Cr. 1.07, NA 140, K+ 3.5  TC 246, TG 114, HDL 80, LDL 143  Review of Systems  DATA OBTAINED: from patient, GENERAL:  Tires easily  No fevers, change in appetite or weight SKIN: No itch, rash. Wound left lower leg has healed RESPIRATORY: No cough, wheezing, SOB CARDIAC: No chest pain, palpitations  No edema. MUSCULOSKELETAL: No back or leg pian today, walking well with walker.  No recent falls.  NEUROLOGIC: No dizziness, fainting, headache  No change in mental status.  PSYCHIATRIC: Increased anxiety- see HPI  Physical Exam Filed Vitals:   04/28/13 1152  BP: 112/62  Pulse: 64  Height: 5\' 3"  (1.6 m)  Weight: 112 lb (50.803 kg)  There is no weight on file to calculate BMI.  GENERAL APPEARANCE: No acute distress, appropriately groomed, Frail body habitus. Alert, pleasant, conversant. SKIN: Left lateral lower leg wound is healed, there is mild residual skin defect and irregular scar.  HEAD: Normocephalic, atraumatic EYES: Conjunctiva/lids clear. Pupils round, reactive. Marland Kitchen  EARS: Significantly Decreased  Hearing   NOSE: No deformity or discharge. MOUTH/THROAT: Lips w/o lesions. Oral mucosa, tongue moist, w/o lesion. Oropharynx w/o redness or lesions.  NECK: Supple, full ROM. No thyroid tenderness, enlargement or nodule LYMPHATICS: No head, neck or supraclavicular adenopathy RESPIRATORY: Breathing is even, unlabored. Lung sounds are clear and full.  CARDIOVASCULAR: Heart RRR. No murmur or extra heart sounds  ARTERIAL: No carotid bruit.   VENOUS: No varicosities. No venous stasis skin changes  EDEMA: No  peripheral edema.  MUSCULOSKELETAL:    Back is without kyphosis, scoliosis or spinal process tenderness. Gait is steady w/ walker NEUROLOGIC: Oriented to time, place, person. Speech clear, no tremor.  PSYCHIATRIC: More anxious than last visit  ASSESSMENT/PLAN   Generalized anxiety disorder Patient did not increase dose of sertraline as recommended, will increase now. Have sent a new Rx to pharmacy. Extensive discussion re: what causes anxiety; was very dependent on caregiver Shirlee Limerick- she is no longer working (illness). WiIl benefit from scheduled caregiver 3days/ week to assist with appointments and other errands. Patient asks that I do not contact her children about this problem.  Long term (current) use of anticoagulants Continues Xarelto, no adverse effects, lab satisfactory.  Hypokalemia Most recent  lab satisfactory  Anemia Most recent H/H improved, monitor at intervals  Spinal stenosis Patient with very little back or leg pian. Using hydrocodone in frequently.   CAD (coronary artery disease), residual 30% septal stenosis No recent CP. Does not appear as if pt. Is taking statin, recent lipid levels OK. Unclear if statin will provide significant benefit in this 79 yo female.   Follow up:  1 month Cathy Wallace T.Cathy Scarboro, NP-C 04/28/2013

## 2013-04-28 NOTE — Assessment & Plan Note (Addendum)
Patient did not increase dose of sertraline as recommended, will increase now. Have sent a new Rx to pharmacy. Extensive discussion re: what causes anxiety; was very dependent on caregiver Shirlee Limerick- she is no longer working (illness). WiIl benefit from scheduled caregiver 3days/ week to assist with appointments and other errands. Patient asks that I do not contact her children about this problem.

## 2013-04-28 NOTE — Assessment & Plan Note (Signed)
Most recent lab satisfactory

## 2013-04-28 NOTE — Assessment & Plan Note (Signed)
No recent CP. Does not appear as if pt. Is taking statin, recent lipid levels OK. Unclear if statin will provide significant benefit in this 79 yo female.

## 2013-04-28 NOTE — Assessment & Plan Note (Signed)
Patient with very little back or leg pian. Using hydrocodone in frequently.

## 2013-04-28 NOTE — Assessment & Plan Note (Signed)
Most recent H/H improved, monitor at intervals

## 2013-05-14 ENCOUNTER — Encounter: Payer: Self-pay | Admitting: Internal Medicine

## 2013-05-14 ENCOUNTER — Telehealth: Payer: Self-pay | Admitting: *Deleted

## 2013-05-14 NOTE — Telephone Encounter (Signed)
[facility] faxed a prior authorization for Xarelto 20mg  for approval. I called [phone] and received approval until 05/14/2014 PA# [id number] by Bland Span. [place] [phone] Notified.

## 2013-05-26 ENCOUNTER — Other Ambulatory Visit: Payer: Self-pay

## 2013-05-26 MED ORDER — HYDROCODONE-ACETAMINOPHEN 5-325 MG PO TABS: ORAL_TABLET | ORAL | Status: DC

## 2013-05-26 NOTE — Telephone Encounter (Signed)
Patient called triage line indicating she needs a refill on Hydrocodone. Patient will see if someone can bring her to pick-up rx today. Otherwise patient will wait until the next time she see's [facility]   RX printed and placed up front for pick-up

## 2013-06-02 ENCOUNTER — Encounter: Payer: Self-pay | Admitting: Geriatric Medicine

## 2013-06-02 ENCOUNTER — Non-Acute Institutional Stay: Payer: Medicare Other | Admitting: Geriatric Medicine

## 2013-06-02 VITALS — BP 140/66 | HR 64 | Ht 63.0 in | Wt 113.0 lb

## 2013-06-02 DIAGNOSIS — M4800 Spinal stenosis, site unspecified: Secondary | ICD-10-CM

## 2013-06-02 DIAGNOSIS — I10 Essential (primary) hypertension: Secondary | ICD-10-CM

## 2013-06-02 DIAGNOSIS — F329 Major depressive disorder, single episode, unspecified: Secondary | ICD-10-CM | POA: Insufficient documentation

## 2013-06-02 DIAGNOSIS — F411 Generalized anxiety disorder: Secondary | ICD-10-CM

## 2013-06-02 NOTE — Progress Notes (Signed)
Patient ID: Cathy Wallace, female   DOB: 1917-06-04, 79 y.o.   MRN: [id number]  [facility] [id number])  Code Status: DNR      Contact Information   Name Relation Home Work [place] Other [phone]    Nadel,Beverly Daughter [phone]  [phone]   Abraha,Sherry Other [phone]        Chief Complaint  Patient presents with  . Medical Managment of Chronic Issues    anxiety "very depressed with the holidays"    HPI: This is a 79 y.o. female resident of [name]  section evaluated today for management of ongoing medical issues.   Last visit: Generalized anxiety disorder Patient did not increase dose of sertraline as recommended, will increase now. Have sent a new Rx to pharmacy. Extensive discussion re: what causes anxiety; was very dependent on caregiver Shirlee Limerick- she is no longer working (illness). WiIl benefit from scheduled caregiver 3days/ week to assist with appointments and other errands. Patient asks that I do not contact her children about this problem.  Long term (current) use of anticoagulants Continues Xarelto, no adverse effects, lab satisfactory.  Hypokalemia Most recent lab satisfactory  Anemia Most recent H/H improved, monitor at intervals  Spinal stenosis Patient with very little back or leg pian. Using hydrocodone in frequently.   CAD (coronary artery disease), residual 30% septal stenosis No recent CP. Does not appear as if pt. Is taking statin, recent lipid levels OK. Unclear if statin will provide significant benefit in this 79 yo female.  Since last visit, patient has been experiencing very little back pain, she's not using hydrocodone any longer. Does take Tylenol twice day.  Her main concern has been anxiety and depression. She feels she's very depressed with the holiday season. Her long-term caregiver Shirlee Limerick is no longer able to work. Patient's main  social activity has been Bridge, these games are down to about one week. She will no longer travel to see her children who live out of town, feels unsteady on her feet. Tells me she's not interested in socializing. Hearing is very difficult, she has low energy and is not sleeping well. Patient has been taking her medications as prescribed.  Has not received flu or pneumococcal vaccine; does not take vaccines.  Allergies  Allergen Reactions  . Aspirin   . Benadryl [Diphenhydramine Hcl]   . Codeine Nausea And Vomiting  . Flu Virus Vaccine   . Pneumococcal Vaccines   . Penicillins Rash      Medication List       This list is accurate as of: 06/02/13 11:59 PM.  Always use your most recent med list.               diltiazem 180 MG 24 hr capsule  Commonly known as:  CARDIZEM CD  Take 180 mg by mouth daily.     fluticasone 50 MCG/ACT nasal spray  Commonly known as:  FLONASE  Place 2 sprays into the nose daily.     loratadine 10 MG tablet  Commonly known as:  CLARITIN  Take 10 mg by mouth daily as needed for allergies.     LORazepam 1 MG tablet  Commonly known as:  ATIVAN  Take 0.5 tablets (0.5 mg total) by mouth every 6 (six) hours as needed for anxiety. Take 1 tablet in the morning as needed and 2 at bedtime     Melatonin 3 MG Tabs  Take 1 tablet by mouth at  bedtime.     metoprolol tartrate 25 MG tablet  Commonly known as:  LOPRESSOR  Take 12.5 mg by mouth 2 (two) times daily. Take 1/2 tablet twice daily for blood pressure mangement.     nitroGLYCERIN 0.4 MG SL tablet  Commonly known as:  NITROSTAT  Place 1 tablet (0.4 mg total) under the tongue every 5 (five) minutes x 3 doses as needed for chest pain.     pantoprazole 40 MG tablet  Commonly known as:  PROTONIX  Take 1 tablet (40 mg total) by mouth daily at 12 noon.     sertraline 100 MG tablet  Commonly known as:  ZOLOFT  Take 1 tablet (100 mg total) by mouth daily.     triamterene-hydrochlorothiazide 37.5-25 MG  per capsule  Commonly known as:  DYAZIDE  Take 1 capsule by mouth daily. Take 1 capsule  daily for high blood pressure.     XARELTO 20 MG Tabs tablet  Generic drug:  Rivaroxaban  Take 20 mg by mouth daily.       Data Reviewed       WUJ:WJXBJYN, external  11/16/2012: WBC 11.0, hemoglobin 11.5, hematocrit 35.7, platelets 199.   INR 2.73   glucose 111, BUN 25, creatinine 0.99, sodium 138, potassium 5.0  11/24/2012 WBC 10.4, hemoglobin 12.0, 30 hematocrit 37.0, platelets 465   Glucose 111, BUN 27, creatinine 1.03, sodium 136, potassium 3.7   TSH 0.651   Vitamin B12 280 12/29/2012 WBC 7.5, hemoglobin 11.8, hematocrit 35.8, platelets 439  Glucose 114, BUN 20, creatinine 1.23, sodium 138, potassium 3.4. LFTs/proteins WNL.  B12 786  Vit D 51 04/26/2013 WBC 8.4, Hgb 13.2, Hct 39.7, Plt 330  Glu 127, BUN 21, Cr. 1.07, NA 140, K+ 3.5  TC 246, TG 114, HDL 80, LDL 143  Review of Systems  DATA OBTAINED: from patient, GENERAL:  Tires easily  No fevers, change in appetite or weight SKIN: No itch, rash, wound MOUTH/THROAT: No mouth or tooth pain  No sore throat No difficulty chewing or swallowing RESPIRATORY: No cough, wheezing, SOB CARDIAC: No chest pain, palpitations  No edema. GASTROINTESTINAL: No abdominal pain  No N/V/D or constipation  No heartburn or reflux  GU: No dysuria, frequency or urgency  No change in urine volume or character   MUSCULOSKELETAL: No joint pain, swelling or stiffness  No back pain  No muscle ache, pain, weakness  Gait is steady  No recent falls.  NEUROLOGIC: No dizziness, fainting, headache  No change in mental status.  PSYCHIATRIC: Admits to feelings of anxiety, depression, not sleeping well.    Physical Exam Filed Vitals:   06/02/13 1147  BP: 140/66  Pulse: 64  Height: 5\' 3"  (1.6 m)  Weight: 113 lb (51.256 kg)  Body mass index is 20.02 kg/(m^2).  GENERAL APPEARANCE: No acute distress, appropriately groomed, Frail body habitus. Alert, pleasant,  conversant. SKIN: No diaphoresis, rash, wounds  HEAD: Normocephalic, atraumatic EYES: Conjunctiva/lids clear. Pupils round, reactive. Marland Kitchen  EARS: Significantly Decreased  Hearing   NOSE: No deformity or discharge. MOUTH/THROAT: Lips w/o lesions. Oral mucosa, tongue moist, w/o lesion. Oropharynx w/o redness or lesions.  NECK: Supple, full ROM. No thyroid tenderness, enlargement or nodule LYMPHATICS: No head, neck or supraclavicular adenopathy RESPIRATORY: Breathing is even, unlabored. Lung sounds are clear and full.  CARDIOVASCULAR: Heart RRR. No murmur or extra heart sounds  ARTERIAL: No carotid bruit.   VENOUS: No varicosities. No venous stasis skin changes  EDEMA: No peripheral edema.  MUSCULOSKELETAL:  Back is without kyphosis, scoliosis or spinal process tenderness. Gait is steady w/ walker NEUROLOGIC: Oriented to time, place, person. Speech clear, no tremor.  PSYCHIATRIC: Mild anxiety, mildly depressed affect  ASSESSMENT/PLAN   HTN (hypertension) Blood pressure stable, continue current medications  Generalized anxiety disorder Anxiety has not changed with increased dose of sertraline 50 mg daily, increase to 100 mg at bedtime.  Depression Patient with multiple symptoms of depression including low energy, decreasing socialization, poor sleep as well as subjective feeling of depression. She is in the midst of a life transition with the loss of her long-term companion and caregiver as well as health and living situation changes with her peers. Family is supportive but live out of town. Will increase sertraline but feel this patient will benefit from counseling as well. Have recommended counselor at [facility],  Dorothe Pea. Pt. Agrees.   Spinal stenosis Patient currently with very little back pain, continue daily Tylenol.   Follow up:  3 months  Lab 08/2013   Mineola Duan T.Riyana Biel, NP-C 06/02/2013

## 2013-06-06 MED ORDER — SERTRALINE HCL 100 MG PO TABS: 100.0000 mg | ORAL_TABLET | Freq: Every day | ORAL | Status: DC

## 2013-06-06 NOTE — Assessment & Plan Note (Signed)
Patient currently with very little back pain, continue daily Tylenol.

## 2013-06-06 NOTE — Assessment & Plan Note (Signed)
Anxiety has not changed with increased dose of sertraline 50 mg daily, increase to 100 mg at bedtime.

## 2013-06-06 NOTE — Assessment & Plan Note (Signed)
Patient with multiple symptoms of depression including low energy, decreasing socialization, poor sleep as well as subjective feeling of depression. She is in the midst of a life transition with the loss of her long-term companion and caregiver as well as health and living situation changes with her peers. Family is supportive but live out of town. Will increase sertraline but feel this patient will benefit from counseling as well. Have recommended counselor at [facility],  Dorothe Pea. Pt. Agrees.

## 2013-06-06 NOTE — Assessment & Plan Note (Signed)
Blood pressure stable, continue current medications.  ?

## 2013-06-07 ENCOUNTER — Other Ambulatory Visit: Payer: Self-pay | Admitting: Internal Medicine

## 2013-06-14 ENCOUNTER — Non-Acute Institutional Stay: Payer: Medicare Other | Admitting: Internal Medicine

## 2013-06-14 ENCOUNTER — Encounter (HOSPITAL_COMMUNITY): Payer: Self-pay | Admitting: Emergency Medicine

## 2013-06-14 ENCOUNTER — Emergency Department (HOSPITAL_COMMUNITY): Payer: Medicare Other

## 2013-06-14 ENCOUNTER — Emergency Department (HOSPITAL_COMMUNITY)
Admission: EM | Admit: 2013-06-14 | Discharge: 2013-06-14 | Disposition: A | Discharge status: Home / Self Care | Payer: Medicare Other | Attending: Emergency Medicine | Admitting: Emergency Medicine

## 2013-06-14 ENCOUNTER — Encounter: Payer: Self-pay | Admitting: Internal Medicine

## 2013-06-14 VITALS — BP 140/64 | HR 68

## 2013-06-14 DIAGNOSIS — Z862 Personal history of diseases of the blood and blood-forming organs and certain disorders involving the immune mechanism: Secondary | ICD-10-CM | POA: Insufficient documentation

## 2013-06-14 DIAGNOSIS — F411 Generalized anxiety disorder: Secondary | ICD-10-CM | POA: Insufficient documentation

## 2013-06-14 DIAGNOSIS — S32000A Wedge compression fracture of unspecified lumbar vertebra, initial encounter for closed fracture: Secondary | ICD-10-CM

## 2013-06-14 DIAGNOSIS — Z880 Allergy status to penicillin: Secondary | ICD-10-CM | POA: Insufficient documentation

## 2013-06-14 DIAGNOSIS — Z86711 Personal history of pulmonary embolism: Secondary | ICD-10-CM | POA: Insufficient documentation

## 2013-06-14 DIAGNOSIS — S32009A Unspecified fracture of unspecified lumbar vertebra, initial encounter for closed fracture: Secondary | ICD-10-CM | POA: Insufficient documentation

## 2013-06-14 DIAGNOSIS — Y9389 Activity, other specified: Secondary | ICD-10-CM | POA: Insufficient documentation

## 2013-06-14 DIAGNOSIS — R58 Hemorrhage, not elsewhere classified: Secondary | ICD-10-CM | POA: Insufficient documentation

## 2013-06-14 DIAGNOSIS — W010XXA Fall on same level from slipping, tripping and stumbling without subsequent striking against object, initial encounter: Secondary | ICD-10-CM | POA: Insufficient documentation

## 2013-06-14 DIAGNOSIS — Z86718 Personal history of other venous thrombosis and embolism: Secondary | ICD-10-CM | POA: Insufficient documentation

## 2013-06-14 DIAGNOSIS — I252 Old myocardial infarction: Secondary | ICD-10-CM | POA: Insufficient documentation

## 2013-06-14 DIAGNOSIS — Z79899 Other long term (current) drug therapy: Secondary | ICD-10-CM | POA: Insufficient documentation

## 2013-06-14 DIAGNOSIS — Z9181 History of falling: Secondary | ICD-10-CM

## 2013-06-14 DIAGNOSIS — R269 Unspecified abnormalities of gait and mobility: Secondary | ICD-10-CM | POA: Insufficient documentation

## 2013-06-14 DIAGNOSIS — S0081XA Abrasion of other part of head, initial encounter: Secondary | ICD-10-CM

## 2013-06-14 DIAGNOSIS — Z7901 Long term (current) use of anticoagulants: Secondary | ICD-10-CM | POA: Insufficient documentation

## 2013-06-14 DIAGNOSIS — F1021 Alcohol dependence, in remission: Secondary | ICD-10-CM | POA: Insufficient documentation

## 2013-06-14 DIAGNOSIS — S0990XA Unspecified injury of head, initial encounter: Secondary | ICD-10-CM

## 2013-06-14 DIAGNOSIS — F3289 Other specified depressive episodes: Secondary | ICD-10-CM | POA: Insufficient documentation

## 2013-06-14 DIAGNOSIS — Z8639 Personal history of other endocrine, nutritional and metabolic disease: Secondary | ICD-10-CM | POA: Insufficient documentation

## 2013-06-14 DIAGNOSIS — Z8739 Personal history of other diseases of the musculoskeletal system and connective tissue: Secondary | ICD-10-CM | POA: Insufficient documentation

## 2013-06-14 DIAGNOSIS — Z950 Presence of cardiac pacemaker: Secondary | ICD-10-CM | POA: Insufficient documentation

## 2013-06-14 DIAGNOSIS — I10 Essential (primary) hypertension: Secondary | ICD-10-CM | POA: Insufficient documentation

## 2013-06-14 DIAGNOSIS — Z8781 Personal history of (healed) traumatic fracture: Secondary | ICD-10-CM | POA: Insufficient documentation

## 2013-06-14 DIAGNOSIS — S60222A Contusion of left hand, initial encounter: Secondary | ICD-10-CM

## 2013-06-14 DIAGNOSIS — K219 Gastro-esophageal reflux disease without esophagitis: Secondary | ICD-10-CM | POA: Insufficient documentation

## 2013-06-14 DIAGNOSIS — G4700 Insomnia, unspecified: Secondary | ICD-10-CM | POA: Insufficient documentation

## 2013-06-14 DIAGNOSIS — W19XXXA Unspecified fall, initial encounter: Secondary | ICD-10-CM

## 2013-06-14 DIAGNOSIS — F329 Major depressive disorder, single episode, unspecified: Secondary | ICD-10-CM | POA: Insufficient documentation

## 2013-06-14 DIAGNOSIS — Z853 Personal history of malignant neoplasm of breast: Secondary | ICD-10-CM | POA: Insufficient documentation

## 2013-06-14 DIAGNOSIS — Y92009 Unspecified place in unspecified non-institutional (private) residence as the place of occurrence of the external cause: Secondary | ICD-10-CM | POA: Insufficient documentation

## 2013-06-14 DIAGNOSIS — IMO0002 Reserved for concepts with insufficient information to code with codable children: Secondary | ICD-10-CM | POA: Insufficient documentation

## 2013-06-14 DIAGNOSIS — I998 Other disorder of circulatory system: Secondary | ICD-10-CM

## 2013-06-14 DIAGNOSIS — S60229A Contusion of unspecified hand, initial encounter: Secondary | ICD-10-CM | POA: Insufficient documentation

## 2013-06-14 MED ORDER — OXYCODONE-ACETAMINOPHEN 5-325 MG PO TABS: 1.0000 | ORAL_TABLET | Freq: Three times a day (TID) | ORAL | Status: DC | PRN

## 2013-06-14 MED ORDER — ONDANSETRON 4 MG PO TBDP
4.0000 mg | ORAL_TABLET | Freq: Once | ORAL | Status: AC
  Administered 2013-06-14: 4 mg via ORAL
  Filled 2013-06-14: qty 1

## 2013-06-14 MED ORDER — OXYCODONE-ACETAMINOPHEN 5-325 MG PO TABS
1.0000 | ORAL_TABLET | Freq: Once | ORAL | Status: AC
  Administered 2013-06-14: 1 via ORAL
  Filled 2013-06-14: qty 1

## 2013-06-14 NOTE — ED Notes (Signed)
PT transported via [facility] to independent living center.

## 2013-06-14 NOTE — Patient Instructions (Signed)
You were advised to go to the Rehab Unit. You have chosen to return to your apartment. Please be careful as the Percocet could further affect your balance.

## 2013-06-14 NOTE — ED Provider Notes (Signed)
CSN: [id number]     Arrival date & time 06/14/13  1035 History   None    Chief Complaint  Patient presents with  . Fall   (Consider location/radiation/quality/duration/timing/severity/associated sxs/prior Treatment) HPI Comments: 79 yo female with hx of DVT/PE--currently anticoagulated on xarelto--HTN, MI, pacemaker, closed pelvic fracture, lumbar compression fractures who resides at [facility] presents for evaluation after a fall sustained at her apartment today. Reports that she has balance problems at baseline and uses a walker. Today, she was bending over to put soap in the dishwasher and lost her balance, falling forward, striking her left forehead on the ground and catching herself with her left hand/wrist as well as left knee. No LOC. Sustained small laceration left temporal/forehead. Reports hx of worsened back pain over the past week--prior to the fall--and had appointment to see her doctor today for reevaluation. She reports that the back pain is actually better after the fall today. Denies numbness/tingling, new weakness, new bowel/bladder control issues. Denies chest pain, dyspnea.  The history is provided by the patient.    Past Medical History  Diagnosis Date  . DVT (deep venous thrombosis) 2011  . Pulmonary embolism Jan 2012  . HTN (hypertension)   . Spinal stenosis   . Bradycardia, on admit 08/09/2012  . Acute MI 08/10/2012    2D Echo - EF 55-60%, normal  . Pacemaker   . Depression   . GERD (gastroesophageal reflux disease)   . Cancer     hx of breast cancer  . Arthritis   . Closed pelvic fracture 11/11/2012  . Alcohol dependence 11/15/2012  . Long term (current) use of anticoagulants 11/15/2012  . Sacral fracture, closed 11/15/2012    Bilateral nondisplaced sacral fractures.   . L5 vertebral fracture 11/15/2012    Fracture of the L5 left transverse process.    . Anemia 11/15/2012  . B12 deficiency 12/01/2012  . Insomnia 12/01/2012  . Unspecified  vitamin D deficiency 12/07/2012  . Anxiety   . Osteoarthritis 02/10/2013   Past Surgical History  Procedure Laterality Date  . Abdominal hysterectomy    . Elbow surgery    . Cataract extraction Bilateral   . Cardiac catheterization  08/08/2012    2.5x63mm Emerge balloon predilation, 2.75x19mm VeriFLEX bare-metal stent was inserted into the RCA to cover proximal to mid segments of RCA deployed with Noncompliant Sprinter balloon 2.75x19mm up to 2.29mm dilated   History reviewed. No pertinent family history. History  Substance Use Topics  . Smoking status: Never Smoker   . Smokeless tobacco: Never Used  . Alcohol Use: Yes     Comment: 2 oz of scotch in evening   OB History   Grav Para Term Preterm Abortions TAB SAB Ect Mult Living                 Review of Systems  Constitutional: Negative for fever.  HENT: Negative for sore throat.   Respiratory: Negative for cough and shortness of breath.   Cardiovascular: Negative for chest pain.  Gastrointestinal: Negative for abdominal pain.  Musculoskeletal: Positive for back pain. Negative for neck pain and neck stiffness.  Skin: Positive for wound. Negative for rash.  Neurological: Negative for dizziness, seizures, syncope, weakness, light-headedness and headaches.  Hematological: Negative for adenopathy.  Psychiatric/Behavioral: Negative for agitation.    Allergies  Aspirin; Benadryl; Codeine; Flu virus vaccine; Pneumococcal vaccines; and Penicillins  Home Medications   Current Outpatient Rx  Name  Route  Sig  Dispense  Refill  .  diltiazem (CARDIZEM CD) 180 MG 24 hr capsule   Oral   Take 180 mg by mouth daily.         Marland Kitchen loratadine (CLARITIN) 10 MG tablet   Oral   Take 10 mg by mouth daily as needed for allergies.          Marland Kitchen LORazepam (ATIVAN) 1 MG tablet   Oral   Take 0.5 tablets (0.5 mg total) by mouth every 6 (six) hours as needed for anxiety. Take 1 tablet in the morning as needed and 2 at bedtime   120 tablet   5     . Melatonin 3 MG TABS   Oral   Take 1 tablet by mouth at bedtime.         . metoprolol tartrate (LOPRESSOR) 25 MG tablet   Oral   Take 12.5 mg by mouth 2 (two) times daily. Take 1/2 tablet twice daily for blood pressure mangement.         . pantoprazole (PROTONIX) 40 MG tablet   Oral   Take 1 tablet (40 mg total) by mouth daily at 12 noon.   30 tablet   1   . Rivaroxaban (XARELTO) 20 MG TABS tablet   Oral   Take 20 mg by mouth daily.         . sertraline (ZOLOFT) 100 MG tablet   Oral   Take 1 tablet (100 mg total) by mouth daily.   30 tablet   3   . triamterene-hydrochlorothiazide (DYAZIDE) 37.5-25 MG per capsule   Oral   Take 1 capsule by mouth daily. Take 1 capsule  daily for high blood pressure.         . nitroGLYCERIN (NITROSTAT) 0.4 MG SL tablet   Sublingual   Place 1 tablet (0.4 mg total) under the tongue every 5 (five) minutes x 3 doses as needed for chest pain.   25 tablet   12   . oxyCODONE-acetaminophen (PERCOCET/ROXICET) 5-325 MG per tablet   Oral   Take 1 tablet by mouth every 8 (eight) hours as needed for severe pain.   6 tablet   0    BP 178/62  Pulse 65  Temp(Src) 98 F (36.7 C) (Oral)  Resp 18  SpO2 95% Physical Exam  Nursing note and vitals reviewed. Constitutional: She is oriented to person, place, and time. She appears well-developed and well-nourished.  HENT:  Head: Normocephalic.    Right Ear: External ear normal.  Left Ear: External ear normal.  Superficial skin tear left temporal area, bleeding controlled with bandage. Edges approximated. SMall amount surrounding ecchymosis  Eyes: EOM are normal.  Neck: Normal range of motion and full passive range of motion without pain. Neck supple. No spinous process tenderness and no muscular tenderness present.  Cardiovascular: Normal rate, regular rhythm, normal heart sounds and intact distal pulses.   Pulmonary/Chest: Effort normal and breath sounds normal. She exhibits no tenderness.   Abdominal: Soft. There is no tenderness.  Musculoskeletal:       Left wrist: She exhibits tenderness and bony tenderness. She exhibits normal range of motion, no swelling, no effusion, no crepitus, no deformity and no laceration.       Right hip: Normal.       Left hip: Normal.       Lumbar back: She exhibits decreased range of motion ( mild), tenderness ( mild) and bony tenderness. She exhibits no swelling, no edema, no deformity, no laceration, no spasm and normal pulse.  Back:       Left hand: She exhibits tenderness. She exhibits normal range of motion. Normal sensation noted. Normal strength noted.       Hands: Neurological: She is alert and oriented to person, place, and time. She has normal strength. No sensory deficit. Gait ( baseline weakness--always uses walker, ) abnormal. Coordination normal. GCS eye subscore is 4. GCS verbal subscore is 5. GCS motor subscore is 6.  Skin: Skin is warm and dry.  Psychiatric: She has a normal mood and affect.    ED Course  Procedures (including critical care time) Labs Review Labs Reviewed - No data to display Imaging Review Dg Lumbar Spine 2-3 Views  06/14/2013   CLINICAL DATA:  Fall  EXAM: LUMBAR SPINE - 2-3 VIEW  COMPARISON:  11/10/2012  FINDINGS: Osteopenia. Severe L1 compression deformity is stable. Mild L3 compression fracture has worsened. L4 superior endplate compression fracture has developed. L5 is intact.  IMPRESSION: New compression fractures involving L3 and L4 as described. MRI may be helpful to better characterize.   Electronically Signed   By: Maryclare Bean M.D.   On: 06/14/2013 12:47   Dg Pelvis 1-2 Views  06/14/2013   CLINICAL DATA:  Fall.  Back pain.  EXAM: PELVIS - 1-2 VIEW  COMPARISON:  11/11/2012.  FINDINGS: Remote fracture of the right pubic region with incomplete healing and separation of fracture fragments.  No acute pelvic or hip fracture is detected. Bones are osteopenic an if there are persistent symptoms felt to be  related to the pelvis/hips, further imaging may be considered.  Degenerative changes lumbar vertebra with L4 compression fracture. Please see accompanying lumbar spine plain film report.  Vascular calcifications.  IMPRESSION: Remote fracture of the right pubic region with incomplete healing and separation of fracture fragments.  No acute pelvic or hip fracture is detected. Bones are osteopenic an if there are persistent symptoms felt to be related to the pelvis/hips, further imaging may be considered.  Degenerative changes lumbar vertebra with L4 compression fracture. Please see accompanying lumbar spine plain film report.   Electronically Signed   By: Bridgett Larsson M.D.   On: 06/14/2013 12:59   Dg Wrist Complete Left  06/14/2013   CLINICAL DATA:  Fall, hematoma  EXAM: LEFT WRIST - COMPLETE 3+ VIEW  COMPARISON:  None.  FINDINGS: Four views of left wrist submitted. No acute fracture or subluxation. Diffuse osteopenia. Narrowing of radiocarpal joint space.  IMPRESSION: Negative.  Diffuse osteopenia.   Electronically Signed   By: Natasha Mead M.D.   On: 06/14/2013 12:48   Ct Head Wo Contrast  06/14/2013   CLINICAL DATA:  Fall, confusion  EXAM: CT HEAD WITHOUT CONTRAST  CT CERVICAL SPINE WITHOUT CONTRAST  TECHNIQUE: Multidetector CT imaging of the head and cervical spine was performed following the standard protocol without intravenous contrast. Multiplanar CT image reconstructions of the cervical spine were also generated.  COMPARISON:  07/24/2010  FINDINGS: CT HEAD FINDINGS  No skull fracture is noted. There is mucosal thickening with air-fluid level left sphenoid sinus. Mastoid air cells are unremarkable.  No intracranial hemorrhage, mass effect or midline shift. Cerebral atrophy again noted. Again noted periventricular and subcortical white matter decreased attenuation consistent with chronic small vessel ischemic changes. No acute cortical infarction. No mass lesion is noted on this unenhanced scan.  CT  CERVICAL SPINE FINDINGS  Axial images of the cervical spine shows diffuse osteopenia. Computer processed images shows no acute fracture or subluxation. Degenerative changes C1-C2 articulation. Disc space  flattening with mild anterior and mild posterior spurring at C3-C4-C4-C5 C5-C6 and C6-C7 level. No prevertebral soft tissue swelling. Cervical airway is patent.  IMPRESSION: 1. No acute intracranial abnormality. Stable atrophy and chronic white matter disease. 2. No cervical spine acute fracture or subluxation. Degenerative changes as described above.   Electronically Signed   By: Natasha Mead M.D.   On: 06/14/2013 13:03   Ct Cervical Spine Wo Contrast  06/14/2013   CLINICAL DATA:  Fall, confusion  EXAM: CT HEAD WITHOUT CONTRAST  CT CERVICAL SPINE WITHOUT CONTRAST  TECHNIQUE: Multidetector CT imaging of the head and cervical spine was performed following the standard protocol without intravenous contrast. Multiplanar CT image reconstructions of the cervical spine were also generated.  COMPARISON:  07/24/2010  FINDINGS: CT HEAD FINDINGS  No skull fracture is noted. There is mucosal thickening with air-fluid level left sphenoid sinus. Mastoid air cells are unremarkable.  No intracranial hemorrhage, mass effect or midline shift. Cerebral atrophy again noted. Again noted periventricular and subcortical white matter decreased attenuation consistent with chronic small vessel ischemic changes. No acute cortical infarction. No mass lesion is noted on this unenhanced scan.  CT CERVICAL SPINE FINDINGS  Axial images of the cervical spine shows diffuse osteopenia. Computer processed images shows no acute fracture or subluxation. Degenerative changes C1-C2 articulation. Disc space flattening with mild anterior and mild posterior spurring at C3-C4-C4-C5 C5-C6 and C6-C7 level. No prevertebral soft tissue swelling. Cervical airway is patent.  IMPRESSION: 1. No acute intracranial abnormality. Stable atrophy and chronic white  matter disease. 2. No cervical spine acute fracture or subluxation. Degenerative changes as described above.   Electronically Signed   By: Natasha Mead M.D.   On: 06/14/2013 13:03   Dg Hand Complete Left  06/14/2013   CLINICAL DATA:  Fall, hematoma  EXAM: LEFT HAND - COMPLETE 3+ VIEW  COMPARISON:  None.  FINDINGS: Three views of left hand submitted. There is diffuse osteopenia. Extensive degenerative changes distal interphalangeal joints. No acute fracture or subluxation. Narrowing of radiocarpal joint space.  IMPRESSION: No acute fracture or subluxation. Diffuse osteopenia. Degenerative changes as described above.   Electronically Signed   By: Natasha Mead M.D.   On: 06/14/2013 12:48    EKG Interpretation   None       MDM   1. Lumbar compression fracture, closed, initial encounter   2. Fall at home, initial encounter   3. Abrasion of face, initial encounter   4. Contusion of left hand, initial encounter   5. Closed head injury, initial encounter    79 yo female with mechanical fall presents for evaluation. Facial lac is well approximated, no sutures/dermabond indicated. CT head neg for intracranial bleeding. CT cervical spine neg for acute fx. Left hand/wrist neg for acute fx. Pelvis xr reveals no acute fx. Lumbar spine xr reveals worsening and new compression fractures--which is c/w with her report of worsening back pain. Neuro exam reveals no deficits, no evidence to suggest cord compression. Will likely need medical management based on her age. Able to ambulate with walker in the room. Percocet administered for pain control. VSS, felt stable for d/c. She is planning on f/u with PCP today at 4pm as previously scheduled. Strict return instructions discussed and provided to the patient in writing at time of d/c.     Simmie Davies, NP 06/14/13 1556

## 2013-06-14 NOTE — Progress Notes (Signed)
Patient ID: Cathy Wallace, female   DOB: 10/04/1916, 79 y.o.   MRN: [id number]    Location:  [facility] [employer] of Service: Clinic (12)    Allergies  Allergen Reactions  . Aspirin   . Benadryl [Diphenhydramine Hcl]   . Codeine Nausea And Vomiting  . Flu Virus Vaccine   . Pneumococcal Vaccines   . Penicillins Rash    Chief Complaint  Patient presents with  . Follow-up    patient slipped im the kitchen today, went to ER. Laceration to the left side of forehead.  Dx; lumbar compression fracture    HPI:  Seen in ER today. Staff concerned about her risk for falling again. We would like to admit her to the rehabilitation area, but patient is adamantly refusing to do this. She has received a prescription for Percocet 5/325 which is a new drug for her.  She has sustained a large bruise of the left wrist, bruising of the left temple, and an older bruise of the left knee. She says she is able to use her walker safely.  X-rays in the emergency room of the skull, cervical spine, and left wrist not show any acute injuries. There is an older fracture of lumbar spine the patient dates back 2 years ago. There is an old pubic ramus fracture. The brain showed atrophy and chronic white matter disease.  There has been no nausea, palpitations, confusion, or recent acute illness.  Medications: Patient's Medications  New Prescriptions   No medications on file  Previous Medications   DILTIAZEM (CARDIZEM CD) 180 MG 24 HR CAPSULE    Take 180 mg by mouth daily.   LORATADINE (CLARITIN) 10 MG TABLET    Take 10 mg by mouth daily as needed for allergies.    LORAZEPAM (ATIVAN) 1 MG TABLET    Take 0.5 tablets (0.5 mg total) by mouth every 6 (six) hours as needed for anxiety. Take 1 tablet in the morning as needed and 2 at bedtime   MELATONIN 3 MG TABS    Take 1 tablet by mouth at bedtime.   METOPROLOL TARTRATE (LOPRESSOR) 25 MG TABLET    Take 12.5 mg by mouth 2 (two) times daily.  Take 1/2 tablet twice daily for blood pressure mangement.   NITROGLYCERIN (NITROSTAT) 0.4 MG SL TABLET    Place 1 tablet (0.4 mg total) under the tongue every 5 (five) minutes x 3 doses as needed for chest pain.   OXYCODONE-ACETAMINOPHEN (PERCOCET/ROXICET) 5-325 MG PER TABLET    Take 1 tablet by mouth every 8 (eight) hours as needed for severe pain.   PANTOPRAZOLE (PROTONIX) 40 MG TABLET    Take 1 tablet (40 mg total) by mouth daily at 12 noon.   RIVAROXABAN (XARELTO) 20 MG TABS TABLET    Take 20 mg by mouth daily.   SERTRALINE (ZOLOFT) 100 MG TABLET    Take 1 tablet (100 mg total) by mouth daily.   TRIAMTERENE-HYDROCHLOROTHIAZIDE (DYAZIDE) 37.5-25 MG PER CAPSULE    Take 1 capsule by mouth daily. Take 1 capsule  daily for high blood pressure.  Modified Medications   No medications on file  Discontinued Medications   No medications on file     Review of Systems  Constitutional: Positive for fatigue. Negative for fever, chills, diaphoresis, activity change, appetite change and unexpected weight change.       Frail, elderly  HENT: Positive for hearing loss (Hearing aid right ear). Negative for congestion, dental problem, drooling, ear  discharge, mouth sores, rhinorrhea and sinus pressure.   Eyes: Negative.   Respiratory: Negative for cough, choking, shortness of breath, wheezing and stridor.   Cardiovascular: Negative for chest pain, palpitations and leg swelling.  Gastrointestinal: Negative for nausea, diarrhea, constipation and abdominal distention.  Endocrine: Negative.   Musculoskeletal:       Large ecchymosis of the left wrist area full range of motion of left wrist. Patient is able to stand with assistance from the wheelchair and walk a short distance across the room. She generally is a walker at home  Skin:       Ecchymoses as noted above.  Allergic/Immunologic: Negative for environmental allergies, food allergies and immunocompromised state.  Neurological: Negative for dizziness,  tremors, seizures, syncope, speech difficulty, weakness, light-headedness, numbness and headaches.  Hematological: Negative.  Negative for adenopathy. Does not bruise/bleed easily.  Psychiatric/Behavioral: Negative for suicidal ideas, hallucinations, behavioral problems, confusion, sleep disturbance, self-injury, dysphoric mood, decreased concentration and agitation. The patient is nervous/anxious. The patient is not hyperactive.       Filed Vitals:   06/14/13 1631  BP: 140/64  Pulse: 68   Physical Exam  Constitutional: She is oriented to person, place, and time.  Frail, elderly. Mildly distressed by today's events.  HENT:  Partial deafness. Aid in right ear.  Cardiovascular: Normal rate, regular rhythm, normal heart sounds and intact distal pulses.  Exam reveals no gallop and no friction rub.   No murmur heard. Pulmonary/Chest: No respiratory distress. She has no wheezes. She has no rales. She exhibits no tenderness.  Abdominal: She exhibits no distension and no mass. There is no tenderness. There is no rebound and no guarding.  Musculoskeletal: Normal range of motion. She exhibits no edema and no tenderness.  Unstable on standing. Generally uses a walker. Currently riding a wheelchair  Neurological: She is alert and oriented to person, place, and time. She has normal reflexes. No cranial nerve deficit. Coordination normal.  Skin:  Large ecchymosis of the left wrist, left temple, and left knee.  Psychiatric: Judgment and thought content normal.  Anxious.       Labs reviewed: No visits with results within 3 Month(s) from this visit. Latest known visit with results is:  Admission on 11/10/2012, Discharged on 11/13/2012  Component Date Value Range Status  . WBC 11/10/2012 11.3* 4.0 - 10.5 K/uL Final  . RBC 11/10/2012 3.55* 3.87 - 5.11 MIL/uL Final  . Hemoglobin 11/10/2012 8.6* 12.0 - 15.0 g/dL Final  . HCT 40/98/1191 27.8* 36.0 - 46.0 % Final  . MCV 11/10/2012 78.3  78.0 -  100.0 fL Final  . MCH 11/10/2012 24.2* 26.0 - 34.0 pg Final  . MCHC 11/10/2012 30.9  30.0 - 36.0 g/dL Final  . RDW 47/82/9562 18.0* 11.5 - 15.5 % Final  . Platelets 11/10/2012 472* 150 - 400 K/uL Final  . Neutrophils Relative % 11/10/2012 78* 43 - 77 % Final  . Neutro Abs 11/10/2012 8.9* 1.7 - 7.7 K/uL Final  . Lymphocytes Relative 11/10/2012 14  12 - 46 % Final  . Lymphs Abs 11/10/2012 1.6  0.7 - 4.0 K/uL Final  . Monocytes Relative 11/10/2012 8  3 - 12 % Final  . Monocytes Absolute 11/10/2012 0.9  0.1 - 1.0 K/uL Final  . Eosinophils Relative 11/10/2012 0  0 - 5 % Final  . Eosinophils Absolute 11/10/2012 0.0  0.0 - 0.7 K/uL Final  . Basophils Relative 11/10/2012 0  0 - 1 % Final  . Basophils Absolute 11/10/2012 0.0  0.0 - 0.1 K/uL Final  . Prothrombin Time 11/10/2012 30.8* 11.6 - 15.2 seconds Final  . INR 11/10/2012 3.17* 0.00 - 1.49 Final  . Color, Urine 11/10/2012 YELLOW  YELLOW Final  . APPearance 11/10/2012 CLEAR  CLEAR Final  . Specific Gravity, Urine 11/10/2012 1.025  1.005 - 1.030 Final  . pH 11/10/2012 5.0  5.0 - 8.0 Final  . Glucose, UA 11/10/2012 NEGATIVE  NEGATIVE mg/dL Final  . Hgb urine dipstick 11/10/2012 NEGATIVE  NEGATIVE Final  . Bilirubin Urine 11/10/2012 SMALL* NEGATIVE Final  . Ketones, ur 11/10/2012 15* NEGATIVE mg/dL Final  . Protein, ur 62/13/0865 NEGATIVE  NEGATIVE mg/dL Final  . Urobilinogen, UA 11/10/2012 0.2  0.0 - 1.0 mg/dL Final  . Nitrite 78/46/9629 NEGATIVE  NEGATIVE Final  . Leukocytes, UA 11/10/2012 NEGATIVE  NEGATIVE Final   MICROSCOPIC NOT DONE ON URINES WITH NEGATIVE PROTEIN, BLOOD, LEUKOCYTES, NITRITE, OR GLUCOSE <1000 mg/dL.  Marland Kitchen Sodium 11/10/2012 139  135 - 145 mEq/L Final  . Potassium 11/10/2012 3.3* 3.5 - 5.1 mEq/L Final  . Chloride 11/10/2012 104  96 - 112 mEq/L Final  . BUN 11/10/2012 20  6 - 23 mg/dL Final  . Creatinine, Ser 11/10/2012 1.00  0.50 - 1.10 mg/dL Final  . Glucose, Bld 52/84/1324 123* 70 - 99 mg/dL Final  . Calcium, Ion  11/10/2012 1.18  1.13 - 1.30 mmol/L Final  . TCO2 11/10/2012 24  0 - 100 mmol/L Final  . Hemoglobin 11/10/2012 9.5* 12.0 - 15.0 g/dL Final  . HCT 40/03/2724 28.0* 36.0 - 46.0 % Final  . WBC 11/11/2012 11.3* 4.0 - 10.5 K/uL Final  . RBC 11/11/2012 3.42* 3.87 - 5.11 MIL/uL Final  . Hemoglobin 11/11/2012 8.4* 12.0 - 15.0 g/dL Final  . HCT 36/64/4034 26.9* 36.0 - 46.0 % Final  . MCV 11/11/2012 78.7  78.0 - 100.0 fL Final  . MCH 11/11/2012 24.6* 26.0 - 34.0 pg Final  . MCHC 11/11/2012 31.2  30.0 - 36.0 g/dL Final  . RDW 74/25/9563 18.0* 11.5 - 15.5 % Final  . Platelets 11/11/2012 411* 150 - 400 K/uL Final  . WBC 11/11/2012 13.2* 4.0 - 10.5 K/uL Final  . RBC 11/11/2012 3.49* 3.87 - 5.11 MIL/uL Final  . Hemoglobin 11/11/2012 8.6* 12.0 - 15.0 g/dL Final  . HCT 87/56/4332 27.5* 36.0 - 46.0 % Final  . MCV 11/11/2012 78.8  78.0 - 100.0 fL Final  . MCH 11/11/2012 24.6* 26.0 - 34.0 pg Final  . MCHC 11/11/2012 31.3  30.0 - 36.0 g/dL Final  . RDW 95/18/8416 18.2* 11.5 - 15.5 % Final  . Platelets 11/11/2012 493* 150 - 400 K/uL Final  . WBC 11/11/2012 11.9* 4.0 - 10.5 K/uL Final  . RBC 11/11/2012 3.55* 3.87 - 5.11 MIL/uL Final  . Hemoglobin 11/11/2012 8.8* 12.0 - 15.0 g/dL Final  . HCT 60/63/0160 28.0* 36.0 - 46.0 % Final  . MCV 11/11/2012 78.9  78.0 - 100.0 fL Final  . MCH 11/11/2012 24.8* 26.0 - 34.0 pg Final  . MCHC 11/11/2012 31.4  30.0 - 36.0 g/dL Final  . RDW 10/93/2355 18.4* 11.5 - 15.5 % Final  . Platelets 11/11/2012 502* 150 - 400 K/uL Final  . WBC 11/11/2012 12.0* 4.0 - 10.5 K/uL Final  . RBC 11/11/2012 3.13* 3.87 - 5.11 MIL/uL Final  . Hemoglobin 11/11/2012 7.8* 12.0 - 15.0 g/dL Final  . HCT 73/22/0254 24.5* 36.0 - 46.0 % Final  . MCV 11/11/2012 78.3  78.0 - 100.0 fL Final  . MCH 11/11/2012 24.9* 26.0 -  34.0 pg Final  . MCHC 11/11/2012 31.8  30.0 - 36.0 g/dL Final  . RDW 65/78/4696 18.4* 11.5 - 15.5 % Final  . Platelets 11/11/2012 434* 150 - 400 K/uL Final  . ABO/RH(D) 11/11/2012 O  POS   Final  . Antibody Screen 11/11/2012 NEG   Final  . Sample Expiration 11/11/2012 11/14/2012   Final  . Unit Number 11/11/2012 [id number]   Final  . Blood Component Type 11/11/2012 RED CELLS,LR   Final  . Unit division 11/11/2012 00   Final  . Status of Unit 11/11/2012 ISSUED,FINAL   Final  . Transfusion Status 11/11/2012 OK TO TRANSFUSE   Final  . Crossmatch Result 11/11/2012 Compatible   Final  . Unit Number 11/11/2012 [id number]   Final  . Blood Component Type 11/11/2012 RBC LR PHER2   Final  . Unit division 11/11/2012 00   Final  . Status of Unit 11/11/2012 ISSUED,FINAL   Final  . Transfusion Status 11/11/2012 OK TO TRANSFUSE   Final  . Crossmatch Result 11/11/2012 Compatible   Final  . ABO/RH(D) 11/11/2012 O POS   Final  . Vitamin D 1, 25 (OH) Total 11/11/2012 37  18 - 72 pg/mL Final  . Vitamin D3 1, 25 (OH) 11/11/2012 37   Final  . Vitamin D2 1, 25 (OH) 11/11/2012 <8   Final   Comment: (NOTE)                          Vitamin D3, 1,25(OH)2 indicates both endogenous                          production and supplementation.  Vitamin D2, 1,25(OH)2                          is an indicator of exogeous sources, such as diet or                          supplementation.  Interpretation and therapy are based                          on measurement of Vitamin D,1,25(OH)2, Total.                          This test was developed and its performance                          characteristics have been determined by [facility], [place], [place].                          Performance characteristics refer to the                          analytical performance of the test.  . Vit D, 25-Hydroxy 11/11/2012 13* 30 - 89 ng/mL Final   Comment: (NOTE)                          This assay accurately quantifies Vitamin D, which is the sum of  the                          25-Hydroxy forms of Vitamin D2 and D3.  Studies have shown that the                           optimum concentration of 25-Hydroxy Vitamin D is 30 ng/mL or higher.                           Concentrations of Vitamin D between 20 and 29 ng/mL are considered to                          be insufficient and concentrations less than 20 ng/mL are considered                          to be deficient for Vitamin D.  . Hemoglobin 11/12/2012 8.1* 12.0 - 15.0 g/dL Final  . HCT 16/04/9603 25.7* 36.0 - 46.0 % Final  . Sodium 11/12/2012 138  135 - 145 mEq/L Final  . Potassium 11/12/2012 3.9  3.5 - 5.1 mEq/L Final  . Chloride 11/12/2012 102  96 - 112 mEq/L Final  . CO2 11/12/2012 21  19 - 32 mEq/L Final  . Glucose, Bld 11/12/2012 106* 70 - 99 mg/dL Final  . BUN 54/03/8118 27* 6 - 23 mg/dL Final  . Creatinine, Ser 11/12/2012 1.56* 0.50 - 1.10 mg/dL Final   REPEATED TO VERIFY  . Calcium 11/12/2012 8.9  8.4 - 10.5 mg/dL Final  . GFR calc non Af Amer 11/12/2012 27* >90 mL/min Final  . GFR calc Af Amer 11/12/2012 32* >90 mL/min Final   Comment:                                 The eGFR has been calculated                          using the CKD EPI equation.                          This calculation has not been                          validated in all clinical                          situations.                          eGFR's persistently                          <90 mL/min signify                          possible Chronic Kidney Disease.  Marland Kitchen Prothrombin Time 11/12/2012 28.8* 11.6 - 15.2 seconds Final  . INR 11/12/2012 2.90* 0.00 - 1.49 Final  . Order Confirmation 11/12/2012 ORDER PROCESSED BY BLOOD BANK   Final  . Fecal Occult Bld 11/12/2012 NEGATIVE  NEGATIVE Final  . Prothrombin  Time 11/13/2012 23.6* 11.6 - 15.2 seconds Final  . INR 11/13/2012 2.21* 0.00 - 1.49 Final  . Sodium 11/13/2012 135  135 - 145 mEq/L Final  . Potassium 11/13/2012 2.9* 3.5 - 5.1 mEq/L Final   DELTA CHECK NOTED  . Chloride 11/13/2012 98  96 - 112 mEq/L Final  . CO2 11/13/2012 24  19 - 32 mEq/L Final  .  Glucose, Bld 11/13/2012 116* 70 - 99 mg/dL Final  . BUN 96/09/5407 29* 6 - 23 mg/dL Final  . Creatinine, Ser 11/13/2012 1.45* 0.50 - 1.10 mg/dL Final  . Calcium 81/19/1478 8.9  8.4 - 10.5 mg/dL Final  . GFR calc non Af Amer 11/13/2012 30* >90 mL/min Final  . GFR calc Af Amer 11/13/2012 35* >90 mL/min Final   Comment:                                 The eGFR has been calculated                          using the CKD EPI equation.                          This calculation has not been                          validated in all clinical                          situations.                          eGFR's persistently                          <90 mL/min signify                          possible Chronic Kidney Disease.  . WBC 11/13/2012 11.8* 4.0 - 10.5 K/uL Final  . RBC 11/13/2012 4.36  3.87 - 5.11 MIL/uL Final  . Hemoglobin 11/13/2012 11.3* 12.0 - 15.0 g/dL Final   POST TRANSFUSION SPECIMEN  . HCT 11/13/2012 34.0* 36.0 - 46.0 % Final  . MCV 11/13/2012 78.0  78.0 - 100.0 fL Final  . MCH 11/13/2012 25.9* 26.0 - 34.0 pg Final  . MCHC 11/13/2012 33.2  30.0 - 36.0 g/dL Final  . RDW 29/56/2130 17.4* 11.5 - 15.5 % Final  . Platelets 11/13/2012 433* 150 - 400 K/uL Final  . Neutrophils Relative % 11/13/2012 80* 43 - 77 % Final  . Neutro Abs 11/13/2012 9.4* 1.7 - 7.7 K/uL Final  . Lymphocytes Relative 11/13/2012 10* 12 - 46 % Final  . Lymphs Abs 11/13/2012 1.2  0.7 - 4.0 K/uL Final  . Monocytes Relative 11/13/2012 8  3 - 12 % Final  . Monocytes Absolute 11/13/2012 1.0  0.1 - 1.0 K/uL Final  . Eosinophils Relative 11/13/2012 2  0 - 5 % Final  . Eosinophils Absolute 11/13/2012 0.2  0.0 - 0.7 K/uL Final  . Basophils Relative 11/13/2012 0  0 - 1 % Final  . Basophils Absolute 11/13/2012 0.0  0.0 - 0.1 K/uL Final      Assessment/Plan Ecchymosis: Multiple. They will resolve with time.  History of fall: At risk for additional falls  Generalized anxiety disorder: Currently getting  medication

## 2013-06-14 NOTE — ED Notes (Signed)
Laceration above left eye cleaned. Bleeding stopped.

## 2013-06-14 NOTE — ED Notes (Signed)
NP at bedside discussing results

## 2013-06-14 NOTE — ED Notes (Signed)
Pt lives in independent living facility [facility]) and pt slipped in the kitchen. Pt has a laceration to the left side of forehead. Pt takes xarelto. Pt had been complaining of back and hip pain for one week and after the fall, pt says they do not hurt any more. Per EMS BP 154/66, O2 97% on room air.

## 2013-06-15 ENCOUNTER — Non-Acute Institutional Stay (SKILLED_NURSING_FACILITY): Payer: Medicare Other | Admitting: Geriatric Medicine

## 2013-06-15 ENCOUNTER — Encounter: Payer: Self-pay | Admitting: Geriatric Medicine

## 2013-06-15 DIAGNOSIS — R58 Hemorrhage, not elsewhere classified: Secondary | ICD-10-CM

## 2013-06-15 DIAGNOSIS — F411 Generalized anxiety disorder: Secondary | ICD-10-CM

## 2013-06-15 DIAGNOSIS — S32009A Unspecified fracture of unspecified lumbar vertebra, initial encounter for closed fracture: Secondary | ICD-10-CM

## 2013-06-15 DIAGNOSIS — Z7901 Long term (current) use of anticoagulants: Secondary | ICD-10-CM

## 2013-06-15 DIAGNOSIS — I998 Other disorder of circulatory system: Secondary | ICD-10-CM

## 2013-06-15 HISTORY — DX: Unspecified fracture of unspecified lumbar vertebra, initial encounter for closed fracture: S32.009A

## 2013-06-15 NOTE — Assessment & Plan Note (Signed)
Significant ecchymosis around patient's left thigh and left wrist and hand related to fall 06/14/2013. X-rays negative for fractures of the sites

## 2013-06-15 NOTE — Progress Notes (Signed)
Patient ID: Cathy Wallace, female   DOB: 04-09-1917, 79 y.o.   MRN: [id number]  [facility] [id number])  Code Status: DNR Contact Information   Name Relation Home Work [place] Other [phone]    Nadel,Beverly Daughter [phone]  [phone]   Chavira,Sherry Other [phone]        Chief Complaint  Patient presents with  . Vertebral Fracture  . debility    HPI: This is a 79 y.o. female resident of [name], Independent Living  section.  Clinic nurse was called patient's apartment yesterday morning and found her sitting on the floor. She had a small cut above her left eye, was complaining of severe back pain. Patient reported her right leg gave way and she fell to the floor. EMS was called for transfer to emergency department. ED evaluation included radiologic exams of patient's head, neck, left wrist, hand, lumbar spine and pelvis. Patient was found to have a new vertebral fracture at L3-4. She was discharged home with prescription for Percocet and instructions to followup with primary care provider as scheduled at 4 PM. [name] evaluated this patient and recommended rehabilitation stay due to to back pain, unsteady gait and general debility. Patient declined. Arrangements were made for Home Care caregiver to spend the night with the patient. This morning patient requested to be admitted to the Rehabilitation section. This afternoon patient tells me her back pain is significant, Tylenol is helpful, she doesn't feel the Percocet does much more than Tylenol. Patient tells me she began experiencing severe lower back pain about 2 days prior to this fall. She denies any other fall or injury to her back.    Allergies  Allergen Reactions  . Aspirin   . Benadryl [Diphenhydramine Hcl]   . Codeine Nausea And Vomiting  . Flu Virus Vaccine   . Pneumococcal Vaccines   . Penicillins Rash     Medication List         This list is accurate as of: 06/15/13  4:25 PM.  Always use your most recent med list.               acetaminophen 325 MG tablet  Commonly known as:  TYLENOL  Take 650 mg by mouth 3 (three) times daily.     diltiazem 180 MG 24 hr capsule  Commonly known as:  CARDIZEM CD  Take 180 mg by mouth daily.     loratadine 10 MG tablet  Commonly known as:  CLARITIN  Take 10 mg by mouth daily as needed for allergies.     LORazepam 1 MG tablet  Commonly known as:  ATIVAN  Take 0.5 tablets (0.5 mg total) by mouth every 6 (six) hours as needed for anxiety. Take 1 tablet in the morning as needed and 2 at bedtime     Melatonin 3 MG Tabs  Take 1 tablet by mouth at bedtime.     metoprolol tartrate 25 MG tablet  Commonly known as:  LOPRESSOR  Take 12.5 mg by mouth 2 (two) times daily. Take 1/2 tablet twice daily for blood pressure mangement.     nitroGLYCERIN 0.4 MG SL tablet  Commonly known as:  NITROSTAT  Place 1 tablet (0.4 mg total) under the tongue every 5 (five) minutes x 3 doses as needed for chest pain.     oxyCODONE-acetaminophen 5-325 MG per tablet  Commonly known as:  PERCOCET/ROXICET  Take 1 tablet by mouth every 8 (eight) hours as needed for  severe pain.     pantoprazole 40 MG tablet  Commonly known as:  PROTONIX  Take 1 tablet (40 mg total) by mouth daily at 12 noon.     sertraline 100 MG tablet  Commonly known as:  ZOLOFT  Take 1 tablet (100 mg total) by mouth daily.     triamterene-hydrochlorothiazide 37.5-25 MG per capsule  Commonly known as:  DYAZIDE  Take 1 capsule by mouth daily. Take 1 capsule  daily for high blood pressure.     XARELTO 20 MG Tabs tablet  Generic drug:  Rivaroxaban  Take 20 mg by mouth daily.        DATA REVIEWED  Radiologic Exams 06/14/2013  CT HEAD WITHOUT CONTRAST,CT CERVICAL SPINE WITHOUT CONTRAST  COMPARISON:  07/24/2010  IMPRESSION: 1. No acute intracranial abnormality. Stable atrophy and chronic white matter disease. 2. No  cervical spine acute fracture or subluxation. Degenerative changes as described above.   PELVIS - 1-2 VIEW  COMPARISON:  11/11/2012.   IMPRESSION: Remote fracture of the right pubic region with incomplete healing and separation of fracture fragments.  No acute pelvic or hip fracture is detected. Bones are osteopenic an if there are persistent symptoms felt to be related to the pelvis/hips, further imaging may be considered.  Degenerative changes lumbar vertebra with L4 compression fracture. Please see accompanying lumbar spine plain film report.    LEFT WRIST - COMPLETE 3+ VIEW  COMPARISON:  None.  IMPRESSION: Negative.  Diffuse osteopenia.    LEFT HAND - COMPLETE 3+ VIEW  COMPARISON:  None. IMPRESSION: No acute fracture or subluxation. Diffuse osteopenia. Degenerative changes as described above.    LUMBAR SPINE - 2-3 VIEW   COMPARISON:  11/10/2012  IMPRESSION: New compression fractures involving L3 and L4 as described. MRI may be helpful to better characterize.     Cardiovascular Exams:   Laboratory Studies 11/24/2012 WBC 10.4, hemoglobin 12.0, 30 hematocrit 37.0, platelets 465               Glucose 111, BUN 27, creatinine 1.03, sodium 136, potassium 3.7               TSH 0.651               Vitamin B12 280 12/29/2012 WBC 7.5, hemoglobin 11.8, hematocrit 35.8, platelets 439             Glucose 114, BUN 20, creatinine 1.23, sodium 138, potassium 3.4. LFTs/proteins WNL.             B12 786             Vit D 51 04/26/2013 WBC 8.4, Hgb 13.2, Hct 39.7, Plt 330             Glu 127, BUN 21, Cr. 1.07, NA 140, K+ 3.5             TC 246, TG 114, HDL 80, LDL 143       Past Medical History  Diagnosis Date  . DVT (deep venous thrombosis) 2011  . Pulmonary embolism Jan 2012  . HTN (hypertension)   . Spinal stenosis   . Bradycardia, on admit 08/09/2012  . Acute MI 08/10/2012    2D Echo - EF 55-60%, normal  . Pacemaker   . Depression   . GERD (gastroesophageal reflux disease)   .  Cancer     hx of breast cancer  . Arthritis   . Closed pelvic fracture 11/11/2012  . Alcohol dependence 11/15/2012  . Long  term (current) use of anticoagulants 11/15/2012  . Sacral fracture, closed 11/15/2012    Bilateral nondisplaced sacral fractures.   . L5 vertebral fracture 11/15/2012    Fracture of the L5 left transverse process.    . Anemia 11/15/2012  . B12 deficiency 12/01/2012  . Insomnia 12/01/2012  . Unspecified vitamin D deficiency 12/07/2012  . Anxiety   . Osteoarthritis 02/10/2013   Past Surgical History  Procedure Laterality Date  . Abdominal hysterectomy    . Elbow surgery    . Cataract extraction Bilateral   . Cardiac catheterization  08/08/2012    2.5x26mm Emerge balloon predilation, 2.75x9mm VeriFLEX bare-metal stent was inserted into the RCA to cover proximal to mid segments of RCA deployed with Noncompliant Sprinter balloon 2.75x48mm up to 2.80mm dilated   Family Status  Relation Status Death Age  . Mother Deceased   . Father Deceased   . Maternal Grandmother Deceased   . Maternal Grandfather Deceased   . Paternal Grandmother Deceased   . Paternal Grandfather Deceased   . Daughter Alive   . Son Alive    History   Social History Narrative   Widowed since 2007. Resides at [facility], Independent Liv[facility] section since 2006. She was a Insurance underwriter.    No smoking history   Daily alcohol intake (2+oz)   Review of Systems   DATA OBTAINED: from patient, nurse, medical record, GENERAL: Feels tired out   No fevers, change in appetite or weight SKIN: No itch, rash or open wounds EYES: No eye pain, dryness or itching  No change in vision EARS: No earache, tinnitus, change in hearing NOSE: No congestion, drainage or bleeding MOUTH/THROAT: No mouth or tooth pain    No sore throat    No difficulty chewing or swallowing RESPIRATORY: No cough, wheezing, SOB CARDIAC: No chest pain, palpitations  No edema. GI: No abdominal pain  No N/V/D Constipation  present  No heartburn or reflux  GU: No dysuria, frequency or urgency    No change in urine volume or character    MUSCULOSKELETAL: No joint pain, swelling or stiffness  Back pain present with any movement No muscle ache, pain, weakness  Gait is unsteady    NEUROLOGIC: No dizziness, fainting, headache,  No change in mental status.  PSYCHIATRIC: Chronic anxiety, denies depression Sleeps well.     Physical Exam Filed Vitals:   06/15/13 1615  BP: 140/72  Pulse: 67  Temp: 97.6 F (36.4 C)  Resp: 12  Weight: 109 lb (49.442 kg)  SpO2: 98%   Body mass index is 19.31 kg/(m^2).  GENERAL APPEARANCE: No acute distress, appropriately groomed, frail body habitus. Alert, pleasant, conversant. SKIN: No diaphoresis, rash, unusual lesions. Small laceration at outer age of left eyebrow, the wound edges approximated well. There is peri-orbital ecchymosis on the left. HEAD: Normocephalic, atraumatic EYES: Conjunctiva/lids clear. Pupils round, reactive.   EARS: External exam WNL, Poor Hearing  NOSE: No deformity or discharge. MOUTH/THROAT: Lips w/o lesions. Oral mucosa, tongue moist, w/o lesion. Oropharynx w/o redness or lesions.  NECK: Supple, full ROM. No thyroid tenderness, enlargement or nodule LYMPHATICS: No head, neck or supraclavicular adenopathy RESPIRATORY: Breathing is even, unlabored. Lung sounds are clear and full.  CARDIOVASCULAR: Heart RRR. No murmur or extra heart sounds  ARTERIAL: No carotid bruit.  DP pulse 1+.  VENOUS: No varicosities. No venous stasis skin changes  EDEMA: No peripheral  edema.  GASTROINTESTINAL: Abdomen is soft, non-tender, not distended w/ dectreased bowel sounds.  MUSCULOSKELETAL: Moves Rt. UE extremity  with full ROM, strength and tone, wrist and hand are mildly swollen and ecchymotic. Right upper extremity with limited range of motion, chronic. Back withkyphosis, No scoliosis or spinal process tenderness. Back pain is evident when attempting to move to a standing  position  NEUROLOGIC: Oriented to time, place, person. Cranial nerves 2-12 grossly intact, speech clear, no tremor. PSYCHIATRIC: Mood and affect appropriate to situation  ASSESSMENT/PLAN  Ecchymosis Significant ecchymosis around patient's left thigh and left wrist and hand related to fall 06/14/2013. X-rays negative for fractures of the sites  Generalized anxiety disorder Patient remains with elevated anxiety down to 2 need for rehabilitation stay. Patient did increase sertraline as instructed 200 mg at bedtime, will continue this dose.  Long term (current) use of anticoagulants Patient continues long-term anticoagulation with Serrato, no signs of excessive bleeding after fall  Lumbar vertebral fracture Lumbar spine fracture noted at level 3-4 on plain lumbar spine films yesterday. Patient does complain of sudden severe back pain about 2 days ago, so it is unclear if this was a spontaneous fracture or as a result of her fall yesterday. In either case she requires rest, assistance and supervision with all ambulation/ADLs until pain subsides.     Follow up: As needed  Eleftherios Dudenhoeffer T.Omarion Minnehan, NP-C 06/15/2013

## 2013-06-15 NOTE — Assessment & Plan Note (Signed)
Patient remains with elevated anxiety down to 2 need for rehabilitation stay. Patient did increase sertraline as instructed 200 mg at bedtime, will continue this dose.

## 2013-06-15 NOTE — Assessment & Plan Note (Signed)
Lumbar spine fracture noted at level 3-4 on plain lumbar spine films yesterday. Patient does complain of sudden severe back pain about 2 days ago, so it is unclear if this was a spontaneous fracture or as a result of her fall yesterday. In either case she requires rest, assistance and supervision with all ambulation/ADLs until pain subsides.

## 2013-06-15 NOTE — Assessment & Plan Note (Signed)
Patient continues long-term anticoagulation with Serrato, no signs of excessive bleeding after fall

## 2013-06-16 NOTE — ED Provider Notes (Signed)
Medical screening examination/treatment/procedure(s) were performed by non-physician practitioner and as supervising physician I was immediately available for consultation/collaboration.  EKG Interpretation   None         Laray Anger, DO 06/16/13 0745

## 2013-06-22 ENCOUNTER — Non-Acute Institutional Stay (SKILLED_NURSING_FACILITY): Payer: Medicare Other | Admitting: Geriatric Medicine

## 2013-06-22 ENCOUNTER — Encounter: Payer: Self-pay | Admitting: Geriatric Medicine

## 2013-06-22 DIAGNOSIS — F411 Generalized anxiety disorder: Secondary | ICD-10-CM

## 2013-06-22 DIAGNOSIS — M216X9 Other acquired deformities of unspecified foot: Secondary | ICD-10-CM

## 2013-06-22 DIAGNOSIS — Z7901 Long term (current) use of anticoagulants: Secondary | ICD-10-CM

## 2013-06-22 DIAGNOSIS — S32009A Unspecified fracture of unspecified lumbar vertebra, initial encounter for closed fracture: Secondary | ICD-10-CM

## 2013-06-22 DIAGNOSIS — M21371 Foot drop, right foot: Secondary | ICD-10-CM

## 2013-06-22 NOTE — Assessment & Plan Note (Signed)
Patient continues long-term anticoagulation with Xarelto, no signs of excessive bleeding after fall

## 2013-06-22 NOTE — Assessment & Plan Note (Signed)
Remains daily problem for this patient. Arrangements will be made for caregiver assistance daily 9-12. Continue current medication.

## 2013-06-22 NOTE — Assessment & Plan Note (Signed)
L3-4 fx 05/2013, possibly before fall 12/15. Patient with less pain, using prn medication 1-2x/day, continue scheduled Tylenol. F/U in [facility] 2-3 weeks

## 2013-06-22 NOTE — Assessment & Plan Note (Signed)
New abnormality with pt's gait, she is aware and is making adjustment. PT is aware and will begin to focus on this problem. May require AFO for maximum safety

## 2013-06-22 NOTE — Progress Notes (Signed)
Patient ID: Cathy Wallace, female   DOB: May 20, 1917, 79 y.o.   MRN: [id number]  [facility] [phone])  Code Status: DNR     Contact Information   Name Relation Home Work Mobile   [place] Other [phone]    Nadel,Beverly Daughter [phone]   Tanzi,Sherry Other [phone]        Chief Complaint  Patient presents with  . Discharge Note    Rehab discharge    HPI: This is a 79 y.o. female resident of [name], Independent [facility]  section.  Clinic nurse was called patient's apartment 06/14/2013, found her sitting on the floor. She had a small cut above her left eye, was complaining of severe back pain. Patient reported her right leg gave way and she fell to the floor. EMS was called for transfer to emergency department. ED evaluation included radiologic exams of patient's head, neck, left wrist, hand, lumbar spine and pelvis. Patient was found to have a new vertebral fracture at L3-4. She was discharged home with prescription for Percocet and instructions to followup with primary care provider as scheduled at 4 PM. [name] evaluated this patient and recommended rehabilitation stay due to to back pain, unsteady gait and general debility. Patient declined. Arrangements were made for Home Care caregiver to spend the night with the patient. The next morning, patient requested admission to the Rehabilitation section.  Last visit: Ecchymosis Significant ecchymosis around patient's left thigh and left wrist and hand related to fall 06/14/2013. X-rays negative for fractures of the sites  Generalized anxiety disorder Patient remains with elevated anxiety due to need for rehabilitation stay. Patient did increase sertraline as instructed to100 mg at bedtime, will continue this dose.  Long term (current) use of anticoagulants Patient continues long-term anticoagulation with Xarelto, no signs of  excessive bleeding after fall  Lumbar vertebral fracture Lumbar spine fracture noted at level 3-4 on plain lumbar spine films yesterday. Patient does complain of sudden severe back pain about 2 days ago, so it is unclear if this was a spontaneous fracture or as a result of her fall yesterday. In either case she requires rest, assistance and supervision with all ambulation/ADLs until pain subsides.   Since last visit, patient has been ambulating safely with walker, back pain is well managed with 1-2 doses of oxycodone prn along with scheduled Tylenol daily. Continues to require some assistance with dressing, showering. OT home safety evaluation recommends assistance with home management. Patietn [place]  Allergies  Allergen Reactions  . Aspirin   . Benadryl [Diphenhydramine Hcl]   . Codeine Nausea And Vomiting  . Flu Virus Vaccine   . Pneumococcal Vaccines   . Penicillins Rash     Medication List       This list is accurate as of: 06/22/13 12:05 PM.  Always use your most recent med list.               acetaminophen 325 MG tablet  Commonly known as:  TYLENOL  Take 650 mg by mouth 3 (three) times daily.     diltiazem 180 MG 24 hr capsule  Commonly known as:  CARDIZEM CD  Take 180 mg by mouth daily.     loratadine 10 MG tablet  Commonly known as:  CLARITIN  Take 10 mg by mouth daily as needed for allergies.     LORazepam 1 MG tablet  Commonly known as:  ATIVAN  Take 0.5 tablets (  0.5 mg total) by mouth every 6 (six) hours as needed for anxiety. Take 1 tablet in the morning as needed and 2 at bedtime     Melatonin 3 MG Tabs  Take 1 tablet by mouth at bedtime.     metoprolol tartrate 25 MG tablet  Commonly known as:  LOPRESSOR  Take 12.5 mg by mouth 2 (two) times daily. Take 1/2 tablet twice daily for blood pressure mangement.     nitroGLYCERIN 0.4 MG SL tablet  Commonly known as:  NITROSTAT  Place 1 tablet (0.4 mg total) under the tongue every  5 (five) minutes x 3 doses as needed for chest pain.     oxyCODONE-acetaminophen 5-325 MG per tablet  Commonly known as:  PERCOCET/ROXICET  Take 1 tablet by mouth every 8 (eight) hours as needed for severe pain.     pantoprazole 40 MG tablet  Commonly known as:  PROTONIX  Take 1 tablet (40 mg total) by mouth daily at 12 noon.     sertraline 100 MG tablet  Commonly known as:  ZOLOFT  Take 1 tablet (100 mg total) by mouth daily.     triamterene-hydrochlorothiazide 37.5-25 MG per capsule  Commonly known as:  DYAZIDE  Take 1 capsule by mouth daily. Take 1 capsule  daily for high blood pressure.     XARELTO 20 MG Tabs tablet  Generic drug:  Rivaroxaban  Take 20 mg by mouth daily.        DATA REVIEWED  Radiologic Exams 06/14/2013  CT HEAD WITHOUT CONTRAST,CT CERVICAL SPINE WITHOUT CONTRAST  COMPARISON:  07/24/2010  IMPRESSION: 1. No acute intracranial abnormality. Stable atrophy and chronic white matter disease. 2. No cervical spine acute fracture or subluxation. Degenerative changes as described above.   PELVIS - 1-2 VIEW  COMPARISON:  11/11/2012.   IMPRESSION: Remote fracture of the right pubic region with incomplete healing and separation of fracture fragments.  No acute pelvic or hip fracture is detected. Bones are osteopenic an if there are persistent symptoms felt to be related to the pelvis/hips, further imaging may be considered.  Degenerative changes lumbar vertebra with L4 compression fracture. Please see accompanying lumbar spine plain film report.    LEFT WRIST - COMPLETE 3+ VIEW  COMPARISON:  None.  IMPRESSION: Negative.  Diffuse osteopenia.    LEFT HAND - COMPLETE 3+ VIEW  COMPARISON:  None. IMPRESSION: No acute fracture or subluxation. Diffuse osteopenia. Degenerative changes as described above.    LUMBAR SPINE - 2-3 VIEW   COMPARISON:  11/10/2012  IMPRESSION: New compression fractures involving L3 and L4 as described. MRI may be helpful to better  characterize.    Cardiovascular Exams:   Laboratory Studies 11/24/2012 WBC 10.4, hemoglobin 12.0, 30 hematocrit 37.0, platelets 465               Glucose 111, BUN 27, creatinine 1.03, sodium 136, potassium 3.7               TSH 0.651               Vitamin B12 280 12/29/2012 WBC 7.5, hemoglobin 11.8, hematocrit 35.8, platelets 439             Glucose 114, BUN 20, creatinine 1.23, sodium 138, potassium 3.4. LFTs/proteins WNL.             B12 786             Vit D 51 04/26/2013 WBC 8.4, Hgb 13.2, Hct 39.7, Plt 330  Glu 127, BUN 21, Cr. 1.07, NA 140, K+ 3.5             TC 246, TG 114, HDL 80, LDL 143  Review of Systems   DATA OBTAINED: from patient, nurse, medical record, GENERAL: Feels tired out   No fevers, change in appetite or weight SKIN: No itch, rash or open wounds EYES: No eye pain, dryness or itching  No change in vision EARS: No earache, tinnitus, change in hearing NOSE: No congestion, drainage or bleeding MOUTH/THROAT: No mouth or tooth pain    No sore throat    No difficulty chewing or swallowing RESPIRATORY: No cough, wheezing, SOB CARDIAC: No chest pain, palpitations  No edema. GI: No abdominal pain  No N/V/D Constipation present  No heartburn or reflux  GU: No dysuria, frequency or urgency    No change in urine volume or character    MUSCULOSKELETAL: No joint pain, swelling or stiffness No back pain today No muscle ache, pain, weakness  Gait is unsteady    NEUROLOGIC: No dizziness, fainting, headache,  No change in mental status.  PSYCHIATRIC: Chronic anxiety, denies depression Sleeps well.     Physical Exam Filed Vitals:   06/22/13 1200  BP: 166/67  Pulse: 55  Weight: 106 lb 9.6 oz (48.353 kg)   Body mass index is 18.89 kg/(m^2).  GENERAL APPEARANCE: No acute distress, appropriately groomed, frail body habitus. Alert, pleasant, conversant. SKIN: No diaphoresis, rash, unusual lesions. Small laceration at outer age of left eyebrow is healed,  peri-orbital ecchymosis is nearly resolved. HEAD: Normocephalic, atraumatic EYES: Conjunctiva/lids clear. Pupils round, reactive.   EARS: External exam WNL, Poor Hearing  NOSE: No deformity or discharge. MOUTH/THROAT: Lips w/o lesions. Oral mucosa, tongue moist, w/o lesion. Oropharynx w/o redness or lesions.  NECK: Supple, full ROM. No thyroid tenderness, enlargement or nodule LYMPHATICS: No head, neck or supraclavicular adenopathy RESPIRATORY: Breathing is even, unlabored. Lung sounds are clear and full.  CARDIOVASCULAR: Heart RRR. No murmur or extra heart sounds  ARTERIAL: No carotid bruit.  DP pulse 1+.  VENOUS: No varicosities. No venous stasis skin changes  EDEMA: No peripheral  edema.  GASTROINTESTINAL: Abdomen is soft, non-tender, not distended w/ dectreased bowel sounds.  MUSCULOSKELETAL: Moves Rt. UE extremity with full ROM, strength and tone, wrist and hand swelling/ecchymosis has resolved. Right upper extremity with limited range of motion, chronic. Back with kyphosis, No scoliosis or spinal process tenderness. No back pain is evident when moving to a standing position or walking w/ walker. Rt. Toes lightly drag on floor unless pt. consciously adjusts gait. Patient is unable to flex ankle actively, passive motion is intact. NEUROLOGIC: Oriented to time, place, person. Cranial nerves 2-12 grossly intact, speech clear, no tremor. PSYCHIATRIC: Mood and affect appropriate to situation  ASSESSMENT/PLAN - [place] once caregiver assistance is arranged Right foot drop New abnormality with pt's gait, she is aware and is making adjustment. PT is aware and will begin to focus on this problem. May require AFO for maximum safety  Generalized anxiety disorder Remains daily problem for this patient. Arrangements will be made for caregiver assistance daily 9-12. Continue current medication.  Long term (current) use of anticoagulants Patient continues long-term  anticoagulation with Xarelto, no signs of excessive bleeding after fall   Lumbar vertebral fracture L3-4 fx 05/2013, possibly before fall 12/15. Patient with less pain, using prn medication 1-2x/day, continue scheduled Tylenol. F/U in [facility] 2-3 weeks    Follow up: 2-3  weeks in [facility]  Lindsey Hommel T.Jabre Heo, NP-C 06/22/2013

## 2013-06-28 ENCOUNTER — Emergency Department (HOSPITAL_COMMUNITY): Payer: Medicare Other

## 2013-06-28 ENCOUNTER — Encounter (HOSPITAL_COMMUNITY): Payer: Self-pay | Admitting: Emergency Medicine

## 2013-06-28 ENCOUNTER — Emergency Department (HOSPITAL_COMMUNITY)
Admission: EM | Admit: 2013-06-28 | Discharge: 2013-06-29 | Disposition: A | Discharge status: Home / Self Care | Payer: Medicare Other | Attending: Emergency Medicine | Admitting: Emergency Medicine

## 2013-06-28 DIAGNOSIS — M898X9 Other specified disorders of bone, unspecified site: Secondary | ICD-10-CM | POA: Insufficient documentation

## 2013-06-28 DIAGNOSIS — Y929 Unspecified place or not applicable: Secondary | ICD-10-CM | POA: Diagnosis not present

## 2013-06-28 DIAGNOSIS — D649 Anemia, unspecified: Secondary | ICD-10-CM | POA: Insufficient documentation

## 2013-06-28 DIAGNOSIS — Z888 Allergy status to other drugs, medicaments and biological substances status: Secondary | ICD-10-CM | POA: Diagnosis not present

## 2013-06-28 DIAGNOSIS — S0510XA Contusion of eyeball and orbital tissues, unspecified eye, initial encounter: Secondary | ICD-10-CM | POA: Insufficient documentation

## 2013-06-28 DIAGNOSIS — G319 Degenerative disease of nervous system, unspecified: Secondary | ICD-10-CM | POA: Diagnosis not present

## 2013-06-28 DIAGNOSIS — G4700 Insomnia, unspecified: Secondary | ICD-10-CM | POA: Diagnosis not present

## 2013-06-28 DIAGNOSIS — M129 Arthropathy, unspecified: Secondary | ICD-10-CM | POA: Diagnosis not present

## 2013-06-28 DIAGNOSIS — M1990 Unspecified osteoarthritis, unspecified site: Secondary | ICD-10-CM | POA: Diagnosis not present

## 2013-06-28 DIAGNOSIS — F329 Major depressive disorder, single episode, unspecified: Secondary | ICD-10-CM | POA: Diagnosis not present

## 2013-06-28 DIAGNOSIS — F411 Generalized anxiety disorder: Secondary | ICD-10-CM | POA: Diagnosis not present

## 2013-06-28 DIAGNOSIS — I4891 Unspecified atrial fibrillation: Secondary | ICD-10-CM | POA: Diagnosis not present

## 2013-06-28 DIAGNOSIS — S59909A Unspecified injury of unspecified elbow, initial encounter: Secondary | ICD-10-CM | POA: Diagnosis present

## 2013-06-28 DIAGNOSIS — Z853 Personal history of malignant neoplasm of breast: Secondary | ICD-10-CM | POA: Diagnosis not present

## 2013-06-28 DIAGNOSIS — F1020 Alcohol dependence, uncomplicated: Secondary | ICD-10-CM | POA: Insufficient documentation

## 2013-06-28 DIAGNOSIS — Y9389 Activity, other specified: Secondary | ICD-10-CM | POA: Insufficient documentation

## 2013-06-28 DIAGNOSIS — I252 Old myocardial infarction: Secondary | ICD-10-CM | POA: Diagnosis not present

## 2013-06-28 DIAGNOSIS — Z7901 Long term (current) use of anticoagulants: Secondary | ICD-10-CM | POA: Diagnosis not present

## 2013-06-28 DIAGNOSIS — Z880 Allergy status to penicillin: Secondary | ICD-10-CM | POA: Insufficient documentation

## 2013-06-28 DIAGNOSIS — K219 Gastro-esophageal reflux disease without esophagitis: Secondary | ICD-10-CM | POA: Insufficient documentation

## 2013-06-28 DIAGNOSIS — Z86711 Personal history of pulmonary embolism: Secondary | ICD-10-CM | POA: Insufficient documentation

## 2013-06-28 DIAGNOSIS — S8990XA Unspecified injury of unspecified lower leg, initial encounter: Secondary | ICD-10-CM | POA: Insufficient documentation

## 2013-06-28 DIAGNOSIS — S52509A Unspecified fracture of the lower end of unspecified radius, initial encounter for closed fracture: Secondary | ICD-10-CM | POA: Diagnosis not present

## 2013-06-28 DIAGNOSIS — Z885 Allergy status to narcotic agent status: Secondary | ICD-10-CM | POA: Diagnosis not present

## 2013-06-28 DIAGNOSIS — M6281 Muscle weakness (generalized): Secondary | ICD-10-CM | POA: Insufficient documentation

## 2013-06-28 DIAGNOSIS — S52501A Unspecified fracture of the lower end of right radius, initial encounter for closed fracture: Secondary | ICD-10-CM

## 2013-06-28 DIAGNOSIS — Z79899 Other long term (current) drug therapy: Secondary | ICD-10-CM | POA: Diagnosis not present

## 2013-06-28 DIAGNOSIS — I10 Essential (primary) hypertension: Secondary | ICD-10-CM | POA: Insufficient documentation

## 2013-06-28 DIAGNOSIS — F3289 Other specified depressive episodes: Secondary | ICD-10-CM | POA: Insufficient documentation

## 2013-06-28 DIAGNOSIS — E538 Deficiency of other specified B group vitamins: Secondary | ICD-10-CM | POA: Diagnosis not present

## 2013-06-28 DIAGNOSIS — I4581 Long QT syndrome: Secondary | ICD-10-CM | POA: Insufficient documentation

## 2013-06-28 DIAGNOSIS — Z8673 Personal history of transient ischemic attack (TIA), and cerebral infarction without residual deficits: Secondary | ICD-10-CM | POA: Insufficient documentation

## 2013-06-28 DIAGNOSIS — M4800 Spinal stenosis, site unspecified: Secondary | ICD-10-CM | POA: Insufficient documentation

## 2013-06-28 DIAGNOSIS — W19XXXA Unspecified fall, initial encounter: Secondary | ICD-10-CM | POA: Insufficient documentation

## 2013-06-28 DIAGNOSIS — E785 Hyperlipidemia, unspecified: Secondary | ICD-10-CM | POA: Insufficient documentation

## 2013-06-28 DIAGNOSIS — E559 Vitamin D deficiency, unspecified: Secondary | ICD-10-CM | POA: Insufficient documentation

## 2013-06-28 MED ORDER — OXYCODONE-ACETAMINOPHEN 5-325 MG PO TABS
1.0000 | ORAL_TABLET | Freq: Once | ORAL | Status: AC
  Administered 2013-06-29: 1 via ORAL
  Filled 2013-06-28: qty 1

## 2013-06-28 NOTE — ED Notes (Addendum)
Pt reports to the ED via [facility] following a fall. Pt has had increased falls recently. Pt from the independent area of [facility]. Pt denies LOC, neck, or back pain. Pt complaining of right wrist pain. Swelling and deformity noted. CMS intact. Pts pain resting 2/10. Pt arrives with Splint and ice in place. VSS en route. Pt had 100 mcg of Fentanyl. DNR and MOST form at bedside. 4 lead en route showed NSR with PVCs. Pt placed on 2 L and the PVCs subsided. Pt A&Ox4, resp e/u, and skin warm and dry.

## 2013-06-28 NOTE — ED Provider Notes (Addendum)
CSN: [id number]     Arrival date & time 06/28/13  2124 History   First MD Initiated Contact with Patient 06/28/13 2255     Chief Complaint  Patient presents with  . Fall  . Wrist Injury   (Consider location/radiation/quality/duration/timing/severity/associated sxs/prior Treatment) Patient is a 79 y.o. female presenting with fall and wrist injury. The history is provided by the patient and a relative.  Fall  Wrist Injury She states that she was reaching for something in the next thing she knew, she was on the floor and she had injured her right wrist. Family states that she had told him that her right ankle gave way and they have noticed that she has been dragging her right foot recently. She also had a fall about 2 weeks ago and has been going to physical therapy related to injuries from that fall. Today, she denies loss of consciousness and denies head or neck injury. She rates the pain in her wrist at 6/10 unless he tries to move at which point it becomes 10/10. She is on anticoagulation with rivaroxaban.  Past Medical History  Diagnosis Date  . DVT (deep venous thrombosis) 2011  . Pulmonary embolism Jan 2012  . HTN (hypertension)   . Spinal stenosis   . Bradycardia, on admit 08/09/2012  . Acute MI 08/10/2012    2D Echo - EF 55-60%, normal  . Pacemaker   . Depression   . GERD (gastroesophageal reflux disease)   . Cancer     hx of breast cancer  . Arthritis   . Closed pelvic fracture 11/11/2012  . Alcohol dependence 11/15/2012  . Long term (current) use of anticoagulants 11/15/2012  . Sacral fracture, closed 11/15/2012    Bilateral nondisplaced sacral fractures.   . L5 vertebral fracture 11/15/2012    Fracture of the L5 left transverse process.    . Anemia 11/15/2012  . B12 deficiency 12/01/2012  . Insomnia 12/01/2012  . Unspecified vitamin D deficiency 12/07/2012  . Anxiety   . Osteoarthritis 02/10/2013  . Lumbar vertebral fracture 06/15/2013    L3-4 12/15/20143   Past Surgical  History  Procedure Laterality Date  . Abdominal hysterectomy    . Elbow surgery    . Cataract extraction Bilateral   . Cardiac catheterization  08/08/2012    2.5x31mm Emerge balloon predilation, 2.75x18mm VeriFLEX bare-metal stent was inserted into the RCA to cover proximal to mid segments of RCA deployed with Noncompliant Sprinter balloon 2.75x43mm up to 2.73mm dilated   History reviewed. No pertinent family history. History  Substance Use Topics  . Smoking status: Never Smoker   . Smokeless tobacco: Never Used  . Alcohol Use: Yes     Comment: 2 oz of scotch in evening   OB History   Grav Para Term Preterm Abortions TAB SAB Ect Mult Living                 Review of Systems  All other systems reviewed and are negative.    Allergies  Aspirin; Benadryl; Codeine; Flu virus vaccine; Pneumococcal vaccines; and Penicillins  Home Medications   Current Outpatient Rx  Name  Route  Sig  Dispense  Refill  . acetaminophen (TYLENOL) 325 MG tablet   Oral   Take 650 mg by mouth every 6 (six) hours as needed for mild pain or moderate pain.          Marland Kitchen diltiazem (CARDIZEM CD) 180 MG 24 hr capsule   Oral   Take 180 mg by mouth  daily.         . fluticasone (FLONASE) 50 MCG/ACT nasal spray   Each Nare   Place 2 sprays into both nostrils daily.         Marland Kitchen loratadine (CLARITIN) 10 MG tablet   Oral   Take 10 mg by mouth daily as needed for allergies.          Marland Kitchen LORazepam (ATIVAN) 0.5 MG tablet   Oral   Take 0.5 mg by mouth every 6 (six) hours as needed for anxiety.         . Melatonin 3 MG TABS   Oral   Take 1 tablet by mouth at bedtime.         . metoprolol tartrate (LOPRESSOR) 25 MG tablet   Oral   Take 12.5 mg by mouth 2 (two) times daily.          . Multiple Vitamins-Iron (MULTIVITAMINS WITH IRON) TABS tablet   Oral   Take 1 tablet by mouth daily.         . nitroGLYCERIN (NITROSTAT) 0.4 MG SL tablet   Sublingual   Place 1 tablet (0.4 mg total) under the  tongue every 5 (five) minutes x 3 doses as needed for chest pain.   25 tablet   12   . oxyCODONE-acetaminophen (PERCOCET/ROXICET) 5-325 MG per tablet   Oral   Take 1 tablet by mouth every 8 (eight) hours as needed for severe pain.   6 tablet   0   . pantoprazole (PROTONIX) 40 MG tablet   Oral   Take 1 tablet (40 mg total) by mouth daily at 12 noon.   30 tablet   1   . Rivaroxaban (XARELTO) 20 MG TABS tablet   Oral   Take 20 mg by mouth daily.         . sertraline (ZOLOFT) 100 MG tablet   Oral   Take 1 tablet (100 mg total) by mouth daily.   30 tablet   3   . triamterene-hydrochlorothiazide (DYAZIDE) 37.5-25 MG per capsule   Oral   Take 1 capsule by mouth daily.          . vitamin B-12 (CYANOCOBALAMIN) 1000 MCG tablet   Oral   Take 1,000 mcg by mouth daily.          BP 156/66  Pulse 68  Temp(Src) 98.5 F (36.9 C) (Oral)  Resp 18  SpO2 100% Physical Exam  Nursing note and vitals reviewed.  79 year old female, resting comfortably and in no acute distress. Vital signs are significant for hypertension with blood pressure 156/66. Oxygen saturation is 100%, which is normal. Head is normocephalic. PERRLA, EOMI. Oropharynx is clear. Resolving left periorbital ecchymosis is present. Neck is nontender and supple without adenopathy or JVD. Back is nontender and there is no CVA tenderness. Lungs are clear without rales, wheezes, or rhonchi. Chest is nontender. Heart has regular rate and rhythm without murmur. Abdomen is soft, flat, nontender without masses or hepatosplenomegaly and peristalsis is normoactive. Extremities: There is soft tissue swelling and deformity of the right wrist consistent with a Colles' fracture. Distal neurovascular exam is intact with normal sensation and prompt capillary refill. No other extremity injury is seen. There is full range of motion of the right ankle without pain and there is no instability. There is no tenderness or swelling of the  right ankle. There is no cyanosis or edema. Skin is warm and dry without rash. Neurologic: Mental status is normal,  cranial nerves are intact, there are no focal motor or sensory deficits. Both legs have mild to moderate weakness with strength 4/5.  ED Course  SPLINT APPLICATION Date/Time: 06/29/2013 12:17 AM Performed by: Dione Booze Authorized by: Preston Fleeting, Mikaiah Stoffer Consent: Verbal consent obtained. written consent obtained. Risks and benefits: risks, benefits and alternatives were discussed Consent given by: patient Patient understanding: patient states understanding of the procedure being performed Patient consent: the patient's understanding of the procedure matches consent given Procedure consent: procedure consent matches procedure scheduled Relevant documents: relevant documents present and verified Test results: test results available and properly labeled Site marked: the operative site was marked Imaging studies: imaging studies available Required items: required blood products, implants, devices, and special equipment available Patient identity confirmed: verbally with patient and arm band Time out: Immediately prior to procedure a "time out" was called to verify the correct patient, procedure, equipment, support staff and site/side marked as required. Location details: right wrist Splint type: sugar tong Supplies used: cotton padding, elastic bandage and Ortho-Glass Post-procedure: The splinted body part was neurovascularly unchanged following the procedure. Patient tolerance: Patient tolerated the procedure well with no immediate complications. Comments: Splint was applied the orthopedic technician. Pain evaluated neurovascular status following application.   (including critical care time) Imaging Review Dg Wrist Complete Right  06/28/2013   CLINICAL DATA:  Wrist injury with pain and swelling after a fall.  EXAM: RIGHT WRIST - COMPLETE 3+ VIEW  COMPARISON:  None.  FINDINGS:  Diffuse bone demineralization. Mostly transverse fractures of the distal right radius and ulna with dorsal displacement and angulation of the distal fracture fragments. Fracture lines do not appear to extend to the radiocarpal joint. Fracture of the ulnar styloid process is also present. Underlying degenerative changes in the carpus. Soft tissue swelling.  IMPRESSION: Displaced fractures of the distal right radius and ulnar metaphysis with additional fracture of the ulnar styloid process.   Electronically Signed   By: Burman Nieves M.D.   On: 06/28/2013 23:59   Dg Ankle Complete Right  06/29/2013   CLINICAL DATA:  Right ankle pain and swelling after a fall.  EXAM: RIGHT ANKLE - COMPLETE 3+ VIEW  COMPARISON:  None.  FINDINGS: Diffuse bone demineralization. No evidence of acute fracture or dislocation. Vascular calcifications are present. Minimal plantar calcaneal spur.  IMPRESSION: No acute fractures demonstrated appear   Electronically Signed   By: Burman Nieves M.D.   On: 06/29/2013 00:03   Ct Head Wo Contrast  06/28/2013   CLINICAL DATA:  Fall.  EXAM: CT HEAD WITHOUT CONTRAST  TECHNIQUE: Contiguous axial images were obtained from the base of the skull through the vertex without intravenous contrast.  COMPARISON:  06/14/2013  FINDINGS: Diffuse cerebral atrophy. Ventricular dilatation consistent with central atrophy. Low-attenuation changes in the deep white matter consistent with small vessel ischemia. No mass effect or midline shift. No abnormal extra-axial fluid collections. Gray-white matter junctions are distinct. Basal cisterns are not effaced. No evidence of acute intracranial hemorrhage. No depressed skull fractures. Partial opacification of the sphenoid sinus. The paranasal sinuses and mastoid air cells are otherwise not opacified. Vascular calcifications. Stable appearance since previous study.  IMPRESSION: No acute intracranial abnormalities. Stable appearance of chronic atrophy and small  vessel ischemic changes.   Electronically Signed   By: Burman Nieves M.D.   On: 06/28/2013 23:58   Images viewed by me.  EKG Interpretation    Date/Time:  Monday June 28 2013 21:50:14 EST Ventricular Rate:  62 PR Interval:  176  QRS Duration: 76 QT Interval:  607 QTC Calculation: 617 R Axis:   -13 Text Interpretation:  Sinus rhythm Abnormal R-wave progression, early transition Inferior infarct, age indeterminate Abnormal T, consider ischemia, anterior leads Lateral leads are also involved Prolonged QT interval When compared with ECG of 11/10/2012, QT has lengthened Confirmed by Preston Fleeting  MD, Kyndle Schlender ([id number]) on 06/29/2013 12:56:06 AM             MDM   1. Closed fracture of right distal radius and ulna, initial encounter    Fall with probable Colles' fracture. The report of the patient dragging her right leg is concerning for a possible stroke but this would not be an acute stroke. She'll be sent for CT to evaluate that time. X-rays will be obtained of her right wrist and right ankle. Old records are reviewed and she was seen on December 15 for a fall with a compression fracture of lumbar spine. Note was made in a followup exam that she does have a new foot drop but no diagnostic testing was done.  X-ray shows evidence of fracture distal radius and ulnar with dorsal angulation consistent with Colles' fracture. Right ankle x-ray and CT scan of the brain were negative. She is placed in a sugar tong splint and was referred to orthopedics. Prescription is given for oxycodone and acetaminophen.  Dione Booze, MD 06/29/13 1610  Dione Booze, MD 06/29/13 [phone]

## 2013-06-28 NOTE — ED Notes (Signed)
Patient transported to CT 

## 2013-06-29 ENCOUNTER — Encounter: Payer: Self-pay | Admitting: Geriatric Medicine

## 2013-06-29 MED ORDER — OXYCODONE-ACETAMINOPHEN 5-325 MG PO TABS: 1.0000 | ORAL_TABLET | ORAL | Status: DC | PRN

## 2013-06-29 NOTE — ED Notes (Signed)
MD at bedside. 

## 2013-06-29 NOTE — Progress Notes (Signed)
Orthopedic Tech Progress Note Patient Details:  Cathy Wallace July 26, 1916 [id number]  Ortho Devices Type of Ortho Device: Arm sling;Sugartong splint   Haskell Flirt 06/29/2013, 12:15 AM

## 2013-06-29 NOTE — ED Notes (Signed)
Awaiting Petar

## 2013-07-01 DIAGNOSIS — I7090 Unspecified atherosclerosis: Secondary | ICD-10-CM

## 2013-07-01 DIAGNOSIS — K5900 Constipation, unspecified: Secondary | ICD-10-CM

## 2013-07-01 DIAGNOSIS — G319 Degenerative disease of nervous system, unspecified: Secondary | ICD-10-CM

## 2013-07-01 DIAGNOSIS — N390 Urinary tract infection, site not specified: Secondary | ICD-10-CM

## 2013-07-01 DIAGNOSIS — I517 Cardiomegaly: Secondary | ICD-10-CM

## 2013-07-01 DIAGNOSIS — I679 Cerebrovascular disease, unspecified: Secondary | ICD-10-CM

## 2013-07-01 HISTORY — DX: Cardiomegaly: I51.7

## 2013-07-01 HISTORY — DX: Degenerative disease of nervous system, unspecified: G31.9

## 2013-07-01 HISTORY — DX: Unspecified atherosclerosis: I70.90

## 2013-07-01 HISTORY — DX: Urinary tract infection, site not specified: N39.0

## 2013-07-01 HISTORY — DX: Constipation, unspecified: K59.00

## 2013-07-01 HISTORY — DX: Cerebrovascular disease, unspecified: I67.9

## 2013-07-02 ENCOUNTER — Encounter (HOSPITAL_COMMUNITY): Payer: Self-pay | Admitting: *Deleted

## 2013-07-03 ENCOUNTER — Ambulatory Visit (HOSPITAL_COMMUNITY): Payer: Medicare Other | Admitting: Anesthesiology

## 2013-07-03 ENCOUNTER — Encounter (HOSPITAL_COMMUNITY): Payer: Self-pay | Admitting: *Deleted

## 2013-07-03 ENCOUNTER — Ambulatory Visit (HOSPITAL_COMMUNITY): Payer: Medicare Other

## 2013-07-03 ENCOUNTER — Encounter (HOSPITAL_COMMUNITY): Payer: Medicare Other | Admitting: Anesthesiology

## 2013-07-03 ENCOUNTER — Ambulatory Visit (HOSPITAL_COMMUNITY)
Admission: EM | Admit: 2013-07-03 | Discharge: 2013-07-04 | Disposition: A | Discharge status: Skilled Nursing Facility | Payer: Medicare Other | Source: Other Acute Inpatient Hospital | Attending: Orthopedic Surgery | Admitting: Orthopedic Surgery

## 2013-07-03 ENCOUNTER — Encounter (HOSPITAL_COMMUNITY)
Admission: EM | Disposition: A | Discharge status: Skilled Nursing Facility | Payer: Self-pay | Source: Other Acute Inpatient Hospital | Attending: Orthopedic Surgery

## 2013-07-03 ENCOUNTER — Encounter (HOSPITAL_COMMUNITY): Payer: Self-pay | Admitting: Pharmacy Technician

## 2013-07-03 DIAGNOSIS — E785 Hyperlipidemia, unspecified: Secondary | ICD-10-CM | POA: Insufficient documentation

## 2013-07-03 DIAGNOSIS — Z950 Presence of cardiac pacemaker: Secondary | ICD-10-CM | POA: Insufficient documentation

## 2013-07-03 DIAGNOSIS — Z86711 Personal history of pulmonary embolism: Secondary | ICD-10-CM | POA: Insufficient documentation

## 2013-07-03 DIAGNOSIS — F329 Major depressive disorder, single episode, unspecified: Secondary | ICD-10-CM | POA: Insufficient documentation

## 2013-07-03 DIAGNOSIS — I252 Old myocardial infarction: Secondary | ICD-10-CM | POA: Insufficient documentation

## 2013-07-03 DIAGNOSIS — Z7901 Long term (current) use of anticoagulants: Secondary | ICD-10-CM | POA: Insufficient documentation

## 2013-07-03 DIAGNOSIS — W19XXXA Unspecified fall, initial encounter: Secondary | ICD-10-CM | POA: Insufficient documentation

## 2013-07-03 DIAGNOSIS — K219 Gastro-esophageal reflux disease without esophagitis: Secondary | ICD-10-CM | POA: Insufficient documentation

## 2013-07-03 DIAGNOSIS — Z853 Personal history of malignant neoplasm of breast: Secondary | ICD-10-CM | POA: Insufficient documentation

## 2013-07-03 DIAGNOSIS — I10 Essential (primary) hypertension: Secondary | ICD-10-CM | POA: Insufficient documentation

## 2013-07-03 DIAGNOSIS — S52509A Unspecified fracture of the lower end of unspecified radius, initial encounter for closed fracture: Secondary | ICD-10-CM | POA: Insufficient documentation

## 2013-07-03 DIAGNOSIS — M1990 Unspecified osteoarthritis, unspecified site: Secondary | ICD-10-CM | POA: Insufficient documentation

## 2013-07-03 DIAGNOSIS — F3289 Other specified depressive episodes: Secondary | ICD-10-CM | POA: Insufficient documentation

## 2013-07-03 DIAGNOSIS — Z86718 Personal history of other venous thrombosis and embolism: Secondary | ICD-10-CM | POA: Insufficient documentation

## 2013-07-03 DIAGNOSIS — F411 Generalized anxiety disorder: Secondary | ICD-10-CM | POA: Insufficient documentation

## 2013-07-03 DIAGNOSIS — S52501A Unspecified fracture of the lower end of right radius, initial encounter for closed fracture: Secondary | ICD-10-CM

## 2013-07-03 DIAGNOSIS — S52609A Unspecified fracture of lower end of unspecified ulna, initial encounter for closed fracture: Principal | ICD-10-CM

## 2013-07-03 HISTORY — PX: ORIF WRIST FRACTURE: SHX2133

## 2013-07-03 HISTORY — DX: Angina pectoris, unspecified: I20.9

## 2013-07-03 HISTORY — DX: Hyperlipidemia, unspecified: E78.5

## 2013-07-03 LAB — BASIC METABOLIC PANEL
BUN: 30 mg/dL — AB (ref 6–23)
CHLORIDE: 96 meq/L (ref 96–112)
CO2: 25 mEq/L (ref 19–32)
Calcium: 9.7 mg/dL (ref 8.4–10.5)
Creatinine, Ser: 1.13 mg/dL — ABNORMAL HIGH (ref 0.50–1.10)
GFR calc Af Amer: 47 mL/min — ABNORMAL LOW (ref >90)
GFR calc non Af Amer: 40 mL/min — ABNORMAL LOW (ref >90)
Glucose, Bld: 144 mg/dL — ABNORMAL HIGH (ref 70–99)
POTASSIUM: 3.5 meq/L — AB (ref 3.7–5.3)
Sodium: 137 mEq/L (ref 137–147)

## 2013-07-03 LAB — CBC
HEMATOCRIT: 41.4 % (ref 36.0–46.0)
HEMOGLOBIN: 13.9 g/dL (ref 12.0–15.0)
MCH: 30.3 pg (ref 26.0–34.0)
MCHC: 33.6 g/dL (ref 30.0–36.0)
MCV: 90.4 fL (ref 78.0–100.0)
Platelets: 311 10*3/uL (ref 150–400)
RBC: 4.58 MIL/uL (ref 3.87–5.11)
RDW: 16.2 % — ABNORMAL HIGH (ref 11.5–15.5)
WBC: 9.8 10*3/uL (ref 4.0–10.5)

## 2013-07-03 LAB — PROTIME-INR
INR: 1.57 — ABNORMAL HIGH (ref 0.00–1.49)
Prothrombin Time: 18.3 seconds — ABNORMAL HIGH (ref 11.6–15.2)

## 2013-07-03 SURGERY — OPEN REDUCTION INTERNAL FIXATION (ORIF) WRIST FRACTURE
Anesthesia: Monitor Anesthesia Care | Site: Wrist | Laterality: Right

## 2013-07-03 MED ORDER — OXYCODONE-ACETAMINOPHEN 5-325 MG PO TABS: 1.0000 | ORAL_TABLET | Freq: Three times a day (TID) | ORAL | Status: DC | PRN

## 2013-07-03 MED ORDER — POLYETHYLENE GLYCOL 3350 17 G PO PACK: 17.0000 g | PACK | Freq: Every day | ORAL | Status: DC | PRN

## 2013-07-03 MED ORDER — LACTATED RINGERS IV SOLN: INTRAVENOUS | Status: DC

## 2013-07-03 MED ORDER — VITAMIN C 500 MG PO TABS
1000.0000 mg | ORAL_TABLET | Freq: Every day | ORAL | Status: DC
  Administered 2013-07-03 – 2013-07-04 (×2): 1000 mg via ORAL
  Filled 2013-07-03 (×2): qty 2

## 2013-07-03 MED ORDER — CLINDAMYCIN PHOSPHATE 900 MG/50ML IV SOLN
900.0000 mg | INTRAVENOUS | Status: AC
  Administered 2013-07-03: 900 mg via INTRAVENOUS
  Filled 2013-07-03 (×2): qty 50

## 2013-07-03 MED ORDER — LIDOCAINE HCL (CARDIAC) 20 MG/ML IV SOLN
INTRAVENOUS | Status: DC | PRN
  Administered 2013-07-03: 20 mg via INTRAVENOUS

## 2013-07-03 MED ORDER — ONDANSETRON HCL 4 MG/2ML IJ SOLN: 4.0000 mg | Freq: Four times a day (QID) | INTRAMUSCULAR | Status: DC | PRN

## 2013-07-03 MED ORDER — FENTANYL CITRATE 0.05 MG/ML IJ SOLN
INTRAMUSCULAR | Status: DC | PRN
  Administered 2013-07-03 (×2): 25 ug via INTRAVENOUS
  Administered 2013-07-03: 50 ug via INTRAVENOUS
  Administered 2013-07-03 (×4): 25 ug via INTRAVENOUS

## 2013-07-03 MED ORDER — PANTOPRAZOLE SODIUM 40 MG PO TBEC
40.0000 mg | DELAYED_RELEASE_TABLET | Freq: Every day | ORAL | Status: DC
  Administered 2013-07-04: 40 mg via ORAL
  Filled 2013-07-03: qty 1

## 2013-07-03 MED ORDER — FENTANYL CITRATE 0.05 MG/ML IJ SOLN: 50.0000 ug | INTRAMUSCULAR | Status: DC | PRN

## 2013-07-03 MED ORDER — PROPOFOL 10 MG/ML IV BOLUS
INTRAVENOUS | Status: DC | PRN
  Administered 2013-07-03: 10 mg via INTRAVENOUS
  Administered 2013-07-03: 20 mg via INTRAVENOUS
  Administered 2013-07-03: 10 mg via INTRAVENOUS

## 2013-07-03 MED ORDER — LACTATED RINGERS IV SOLN
INTRAVENOUS | Status: DC | PRN
  Administered 2013-07-03: 13:00:00 via INTRAVENOUS

## 2013-07-03 MED ORDER — METHOCARBAMOL 500 MG PO TABS
500.0000 mg | ORAL_TABLET | Freq: Four times a day (QID) | ORAL | Status: DC | PRN
  Administered 2013-07-04: 500 mg via ORAL
  Filled 2013-07-03: qty 1

## 2013-07-03 MED ORDER — MORPHINE SULFATE 2 MG/ML IJ SOLN: 1.0000 mg | INTRAMUSCULAR | Status: DC | PRN

## 2013-07-03 MED ORDER — SERTRALINE HCL 100 MG PO TABS
100.0000 mg | ORAL_TABLET | Freq: Every day | ORAL | Status: DC
  Administered 2013-07-04: 100 mg via ORAL
  Filled 2013-07-03: qty 1

## 2013-07-03 MED ORDER — ACETAMINOPHEN 325 MG PO TABS
650.0000 mg | ORAL_TABLET | Freq: Four times a day (QID) | ORAL | Status: DC | PRN
  Administered 2013-07-03: 650 mg via ORAL
  Filled 2013-07-03: qty 2

## 2013-07-03 MED ORDER — 0.9 % SODIUM CHLORIDE (POUR BTL) OPTIME
TOPICAL | Status: DC | PRN
  Administered 2013-07-03: 1000 mL

## 2013-07-03 MED ORDER — ONDANSETRON HCL 4 MG PO TABS: 4.0000 mg | ORAL_TABLET | Freq: Four times a day (QID) | ORAL | Status: DC | PRN

## 2013-07-03 MED ORDER — TRIAMTERENE-HCTZ 37.5-25 MG PO CAPS
1.0000 | ORAL_CAPSULE | Freq: Every day | ORAL | Status: DC
  Administered 2013-07-04: 1 via ORAL
  Filled 2013-07-03: qty 1

## 2013-07-03 MED ORDER — CHLORHEXIDINE GLUCONATE 4 % EX LIQD
60.0000 mL | Freq: Once | CUTANEOUS | Status: DC
  Filled 2013-07-03: qty 60

## 2013-07-03 MED ORDER — MIDAZOLAM HCL 2 MG/2ML IJ SOLN
1.0000 mg | Freq: Once | INTRAMUSCULAR | Status: AC
  Administered 2013-07-03: 1 mg via INTRAVENOUS

## 2013-07-03 MED ORDER — BUPIVACAINE HCL (PF) 0.25 % IJ SOLN
INTRAMUSCULAR | Status: AC
  Filled 2013-07-03: qty 30

## 2013-07-03 MED ORDER — BUPIVACAINE HCL (PF) 0.25 % IJ SOLN: INTRAMUSCULAR | Status: DC | PRN

## 2013-07-03 MED ORDER — MIDAZOLAM HCL 2 MG/2ML IJ SOLN: 1.0000 mg | INTRAMUSCULAR | Status: DC | PRN

## 2013-07-03 MED ORDER — CLINDAMYCIN PHOSPHATE 600 MG/50ML IV SOLN
600.0000 mg | Freq: Three times a day (TID) | INTRAVENOUS | Status: DC
  Administered 2013-07-03 – 2013-07-04 (×2): 600 mg via INTRAVENOUS
  Filled 2013-07-03 (×5): qty 50

## 2013-07-03 MED ORDER — LORATADINE 10 MG PO TABS
10.0000 mg | ORAL_TABLET | Freq: Every day | ORAL | Status: DC | PRN
  Filled 2013-07-03: qty 1

## 2013-07-03 MED ORDER — RIVAROXABAN 20 MG PO TABS
20.0000 mg | ORAL_TABLET | Freq: Every day | ORAL | Status: DC
  Filled 2013-07-03: qty 1

## 2013-07-03 MED ORDER — BUPIVACAINE-EPINEPHRINE PF 0.5-1:200000 % IJ SOLN
INTRAMUSCULAR | Status: DC | PRN
  Administered 2013-07-03: 30 mL

## 2013-07-03 MED ORDER — FENTANYL CITRATE 0.05 MG/ML IJ SOLN
INTRAMUSCULAR | Status: AC
  Administered 2013-07-03: 50 ug via INTRAVENOUS
  Filled 2013-07-03: qty 2

## 2013-07-03 MED ORDER — MIDAZOLAM HCL 5 MG/5ML IJ SOLN
INTRAMUSCULAR | Status: DC | PRN
  Administered 2013-07-03 (×2): 1 mg via INTRAVENOUS

## 2013-07-03 MED ORDER — HYDROCODONE-ACETAMINOPHEN 5-325 MG PO TABS: 1.0000 | ORAL_TABLET | ORAL | Status: DC | PRN

## 2013-07-03 MED ORDER — METOPROLOL TARTRATE 12.5 MG HALF TABLET
12.5000 mg | ORAL_TABLET | Freq: Two times a day (BID) | ORAL | Status: DC
  Administered 2013-07-03 – 2013-07-04 (×2): 12.5 mg via ORAL
  Filled 2013-07-03 (×3): qty 1

## 2013-07-03 MED ORDER — FENTANYL CITRATE 0.05 MG/ML IJ SOLN: 25.0000 ug | INTRAMUSCULAR | Status: DC | PRN

## 2013-07-03 MED ORDER — METHOCARBAMOL 100 MG/ML IJ SOLN
500.0000 mg | Freq: Four times a day (QID) | INTRAMUSCULAR | Status: DC | PRN
  Filled 2013-07-03: qty 5

## 2013-07-03 MED ORDER — LORAZEPAM 1 MG PO TABS: 1.0000 mg | ORAL_TABLET | Freq: Every day | ORAL | Status: DC

## 2013-07-03 MED ORDER — FENTANYL CITRATE 0.05 MG/ML IJ SOLN
50.0000 ug | Freq: Once | INTRAMUSCULAR | Status: AC
  Administered 2013-07-03: 50 ug via INTRAVENOUS

## 2013-07-03 MED ORDER — LORAZEPAM 0.5 MG PO TABS: 0.5000 mg | ORAL_TABLET | Freq: Four times a day (QID) | ORAL | Status: DC | PRN

## 2013-07-03 MED ORDER — OXYCODONE-ACETAMINOPHEN 5-325 MG PO TABS
1.0000 | ORAL_TABLET | ORAL | Status: DC | PRN
  Administered 2013-07-03 – 2013-07-04 (×3): 1 via ORAL
  Filled 2013-07-03 (×3): qty 1

## 2013-07-03 MED ORDER — KCL IN DEXTROSE-NACL 20-5-0.45 MEQ/L-%-% IV SOLN
INTRAVENOUS | Status: DC
  Administered 2013-07-03: 18:00:00 via INTRAVENOUS
  Filled 2013-07-03: qty 1000

## 2013-07-03 MED ORDER — DIPHENHYDRAMINE HCL 25 MG PO CAPS: 25.0000 mg | ORAL_CAPSULE | Freq: Four times a day (QID) | ORAL | Status: DC | PRN

## 2013-07-03 MED ORDER — NITROGLYCERIN 0.4 MG SL SUBL: 0.4000 mg | SUBLINGUAL_TABLET | SUBLINGUAL | Status: DC | PRN

## 2013-07-03 MED ORDER — DOCUSATE SODIUM 100 MG PO CAPS
100.0000 mg | ORAL_CAPSULE | Freq: Two times a day (BID) | ORAL | Status: DC
  Administered 2013-07-03 – 2013-07-04 (×2): 100 mg via ORAL
  Filled 2013-07-03 (×3): qty 1

## 2013-07-03 MED ORDER — DILTIAZEM HCL ER COATED BEADS 180 MG PO CP24
180.0000 mg | ORAL_CAPSULE | Freq: Every day | ORAL | Status: DC
  Administered 2013-07-04: 180 mg via ORAL
  Filled 2013-07-03: qty 1

## 2013-07-03 MED ORDER — MIDAZOLAM HCL 2 MG/2ML IJ SOLN
INTRAMUSCULAR | Status: AC
  Administered 2013-07-03: 12:00:00 1 mg via INTRAVENOUS
  Filled 2013-07-03: qty 2

## 2013-07-03 SURGICAL SUPPLY — 57 items
BANDAGE ELASTIC 3 VELCRO ST LF (GAUZE/BANDAGES/DRESSINGS) ×3 IMPLANT
BANDAGE ELASTIC 4 VELCRO ST LF (GAUZE/BANDAGES/DRESSINGS) ×3 IMPLANT
BANDAGE GAUZE ELAST BULKY 4 IN (GAUZE/BANDAGES/DRESSINGS) IMPLANT
BIT DRILL 2.2 SS TIBIAL (BIT) ×3 IMPLANT
BLADE SURG ROTATE 9660 (MISCELLANEOUS) IMPLANT
BNDG ESMARK 4X9 LF (GAUZE/BANDAGES/DRESSINGS) ×3 IMPLANT
BNDG GAUZE ELAST 4 BULKY (GAUZE/BANDAGES/DRESSINGS) ×3 IMPLANT
CLOTH BEACON ORANGE TIMEOUT ST (SAFETY) IMPLANT
CORDS BIPOLAR (ELECTRODE) ×3 IMPLANT
COVER SURGICAL LIGHT HANDLE (MISCELLANEOUS) ×3 IMPLANT
CUFF TOURNIQUET SINGLE 18IN (TOURNIQUET CUFF) ×3 IMPLANT
CUFF TOURNIQUET SINGLE 24IN (TOURNIQUET CUFF) IMPLANT
DRAIN TLS ROUND 10FR (DRAIN) IMPLANT
DRAPE OEC MINIVIEW 54X84 (DRAPES) ×3 IMPLANT
DRAPE SURG 17X11 SM STRL (DRAPES) ×3 IMPLANT
DRSG ADAPTIC 3X8 NADH LF (GAUZE/BANDAGES/DRESSINGS) ×3 IMPLANT
ELECT REM PT RETURN 9FT ADLT (ELECTROSURGICAL) ×3
ELECTRODE REM PT RTRN 9FT ADLT (ELECTROSURGICAL) ×1 IMPLANT
GLOVE BIOGEL PI IND STRL 8.5 (GLOVE) ×1 IMPLANT
GLOVE BIOGEL PI INDICATOR 8.5 (GLOVE) ×2
GLOVE SURG ORTHO 8.0 STRL STRW (GLOVE) ×3 IMPLANT
GOWN PREVENTION PLUS XLARGE (GOWN DISPOSABLE) ×3 IMPLANT
GOWN STRL NON-REIN LRG LVL3 (GOWN DISPOSABLE) ×6 IMPLANT
K-WIRE 1.6 (WIRE) ×2
K-WIRE FX5X1.6XNS BN SS (WIRE) ×1
KIT BASIN OR (CUSTOM PROCEDURE TRAY) ×3 IMPLANT
KIT ROOM TURNOVER OR (KITS) ×3 IMPLANT
KWIRE FX5X1.6XNS BN SS (WIRE) ×1 IMPLANT
MANIFOLD NEPTUNE II (INSTRUMENTS) ×3 IMPLANT
NEEDLE HYPO 25X1 1.5 SAFETY (NEEDLE) ×3 IMPLANT
NS IRRIG 1000ML POUR BTL (IV SOLUTION) ×3 IMPLANT
PACK ORTHO EXTREMITY (CUSTOM PROCEDURE TRAY) ×3 IMPLANT
PAD ARMBOARD 7.5X6 YLW CONV (MISCELLANEOUS) ×6 IMPLANT
PAD CAST 4YDX4 CTTN HI CHSV (CAST SUPPLIES) ×1 IMPLANT
PADDING CAST COTTON 4X4 STRL (CAST SUPPLIES) ×2
PEG LOCKING SMOOTH 2.2X12 (Peg) ×3 IMPLANT
PEG LOCKING SMOOTH 2.2X18 (Peg) ×3 IMPLANT
PEG LOCKING SMOOTH 2.2X20 (Screw) ×12 IMPLANT
PLATE NARROW DVR RIGHT (Plate) ×3 IMPLANT
SCREW 2.7X14MM (Screw) ×4 IMPLANT
SCREW BN 14X2.7XNONLOCK 3 LD (Screw) ×2 IMPLANT
SCREW LOCK 12X2.7X 3 LD (Screw) ×2 IMPLANT
SCREW LOCK 16X2.7X 3 LD TPR (Screw) ×1 IMPLANT
SCREW LOCKING 2.7X12MM (Screw) ×4 IMPLANT
SCREW LOCKING 2.7X16 (Screw) ×2 IMPLANT
SOAP 2 % CHG 4 OZ (WOUND CARE) ×3 IMPLANT
SPONGE GAUZE 4X4 12PLY (GAUZE/BANDAGES/DRESSINGS) ×3 IMPLANT
SUT PROLENE 4 0 PS 2 18 (SUTURE) ×6 IMPLANT
SUT VIC AB 2-0 FS1 27 (SUTURE) ×3 IMPLANT
SUT VICRYL 4-0 PS2 18IN ABS (SUTURE) ×3 IMPLANT
SYR CONTROL 10ML LL (SYRINGE) IMPLANT
SYSTEM CHEST DRAIN TLS 7FR (DRAIN) IMPLANT
TOWEL OR 17X24 6PK STRL BLUE (TOWEL DISPOSABLE) ×3 IMPLANT
TOWEL OR 17X26 10 PK STRL BLUE (TOWEL DISPOSABLE) ×3 IMPLANT
TUBE CONNECTING 12'X1/4 (SUCTIONS) ×1
TUBE CONNECTING 12X1/4 (SUCTIONS) ×2 IMPLANT
WATER STERILE IRR 1000ML POUR (IV SOLUTION) ×3 IMPLANT

## 2013-07-03 NOTE — Progress Notes (Signed)
ANTIBIOTIC CONSULT NOTE - INITIAL  Pharmacy Consult for clindamycin Indication: s/p hand surgery  Allergies  Allergen Reactions  . Aspirin Other (See Comments)    unknown  . Benadryl [Diphenhydramine Hcl] Other (See Comments)    unknown  . Codeine Nausea And Vomiting  . Flu Virus Vaccine Other (See Comments)    unknown  . Pneumococcal Vaccines Other (See Comments)    unknown  . Penicillins Rash    Patient Measurements: Height: 5\' 3"  (160 cm) Weight: 113 lb (51.256 kg) IBW/kg (Calculated) : 52.4   Vital Signs: Temp: 97.2 F (36.2 C) (01/03 1621) Temp src: Oral (01/03 1126) BP: 170/53 mmHg (01/03 1621) Pulse Rate: 57 (01/03 1621) Intake/Output from previous day:   Intake/Output from this shift: Total I/O In: 600 [I.V.:600] Out: 10 [Blood:10]  Labs:  Recent Labs  07/03/13 1049  WBC 9.8  HGB 13.9  PLT 311  CREATININE 1.13*   Estimated Creatinine Clearance: 24.7 ml/min (by C-G formula based on Cr of 1.13). No results found for this basename: VANCOTROUGH, VANCOPEAK, VANCORANDOM, GENTTROUGH, GENTPEAK, GENTRANDOM, TOBRATROUGH, TOBRAPEAK, TOBRARND, AMIKACINPEAK, AMIKACINTROU, AMIKACIN,  in the last 72 hours   Microbiology: No results found for this or any previous visit (from the past 720 hour(s)).  Medical History: Past Medical History  Diagnosis Date  . DVT (deep venous thrombosis) 2011  . Pulmonary embolism Jan 2012  . HTN (hypertension)   . Spinal stenosis   . Bradycardia, on admit 08/09/2012  . Acute MI 08/10/2012    2D Echo - EF 55-60%, normal  . Pacemaker   . Depression   . GERD (gastroesophageal reflux disease)   . Cancer     hx of breast cancer  . Arthritis   . Closed pelvic fracture 11/11/2012  . Alcohol dependence 11/15/2012  . Long term (current) use of anticoagulants 11/15/2012  . Sacral fracture, closed 11/15/2012    Bilateral nondisplaced sacral fractures.   . L5 vertebral fracture 11/15/2012    Fracture of the L5 left transverse process.     . Anemia 11/15/2012  . B12 deficiency 12/01/2012  . Insomnia 12/01/2012  . Unspecified vitamin D deficiency 12/07/2012  . Anxiety   . Osteoarthritis 02/10/2013  . Lumbar vertebral fracture 06/15/2013    L3-4 12/15/20143  . Anginal pain   . Hyperlipemia    Assessment: 80 year old female s/p hand surgery for fractured wrist. Received orders for clindamycin post-op. No renal dosing recommended.    On xarelto pta with dose already taken today. Rescheduled to restart tomorrow.  Plan:  Clindamycin 600mg  q8 hours   Georgina Peer 07/03/2013,4:27 PM

## 2013-07-03 NOTE — Anesthesia Postprocedure Evaluation (Signed)
Anesthesia Post Note  Patient: Cathy Wallace  Procedure(s) Performed: Procedure(s) (LRB): OPEN REDUCTION INTERNAL FIXATION (ORIF) RIGHT WRIST FRACTURE (Right)  Anesthesia type: MAC and nerve block  Patient location: PACU  Post pain: Pain level controlled and Adequate analgesia  Post assessment: Post-op Vital signs reviewed, Patient's Cardiovascular Status Stable and Respiratory Function Stable  Last Vitals:  Filed Vitals:   07/03/13 1539  BP: 141/50  Pulse: 49  Temp:   Resp: 12    Post vital signs: Reviewed and stable  Level of consciousness: awake, alert  and oriented  Complications: No apparent anesthesia complications

## 2013-07-03 NOTE — Transfer of Care (Signed)
Immediate Anesthesia Transfer of Care Note  Patient: Cathy Wallace  Procedure(s) Performed: Procedure(s): OPEN REDUCTION INTERNAL FIXATION (ORIF) RIGHT WRIST FRACTURE (Right)  Patient Location: PACU  Anesthesia Type:MAC combined with regional for post-op pain  Level of Consciousness: awake  Airway & Oxygen Therapy: Patient Spontanous Breathing  Post-op Assessment: Report given to PACU RN and Post -op Vital signs reviewed and stable  Post vital signs: Reviewed and stable  Complications: No apparent anesthesia complications

## 2013-07-03 NOTE — Op Note (Signed)
PREOPERATIVE DIAGNOSIS: Right wrist intra-articular distal radius  fracture, 2 or more fragments.   POSTOPERATIVE DIAGNOSIS: Right wrist intra-articular distal radius  fracture, 2 or more fragments.   ATTENDING PHYSICIAN: Linna Hoff IV, MD who scrubbed and present  entire procedure.   ASSISTANT SURGEON: None.   ANESTHESIA: Supraclavicular block performed by Dr. Caren Macadam and IV sedation  SURGICAL IMPLANTS: [employer] DVR plate, narrow with six distal locking pegs and 3 bicortical screws proximally and 2 locking screws proximally, crosslock plate  SURGICAL PROCEDURE:  1. Open treatment of Right wrist intra-articular distal radius  fracture, 2 or more fragments.  2. Right wrist brachioradialis tenotomy and release.  3. Radiographs 3 view right wrist.   SURGICAL INDICATIONS: Cathy Wallace is a right-hand-dominant female who sustained an intra-articular distal radius fracture after a fall. The  patient was seen and evaluated in the office based on degree of  displacement and the  displacement, recommended that she undergo  the above procedure. Risks, benefits, and alternatives were discussed  in detail with the patient. Signed informed consent was obtained.  Risks include, but not limited to bleeding, infection, damage to nearby  nerves, arteries, or tendons, nonunion, malunion, hardware failure, loss  of motion of the elbow, wrist, and digits, and need for further surgical  intervention.   PROCEDURE: The patient was properly identified in the preop holding  area. A mark with a permanent marker was made on the rightt wrist to  indicate correct operative site. The patient tolerated the block  performed by Anesthesia. The patient was then brought back to the  operating room. The patient received preoperative antibiotics. IV sedation was induced. Right upper extremity was prepped and draped in  normal sterile fashion. Time-out was called. Correct site was  identified, and  procedure then begun. Attention was then turned to the  Right wrist. The limb was then elevated using Esmarch exsanguination and  tourniquet insufflated. A longitudinal incision was made directly over  the FCR sheath. Dissection was then carried down through the skin and  subcutaneous tissue. The FCR sheath was then opened proximally and  distally. Careful dissection was done going through the floor of the  FCR sheath where the FPL was identified. An L-shaped pronator quadratus  flap was then elevated. In order to aid in reduction tenotomy of the brachioradialis was completed with protection of the first dorsal compartment tendons.  The fracture site was then opened and the patient did have several fracture fragment extendin into the joint greater than 2 part intra-articular fracture. Careful open reduction was then carried out. The appropriate size dvr plate was chosen.. The oblong screw hole was then drilled with a 2.2 mm drill bit, then 2.7 mm bicortical screw. Plate height was adjusted.   After position was then confirmed using mini C-arm, the distal row  fixation was then carried out with the beginning from an ulnar to radial  direction with the  locking pegs. The total of 6 locking pegs were then placed. Following this, attention was then turned proximally where 2 more nonlocking screws were then placed in the 2.2 mm drill bit, the 2.7 mm non-locking screws. Two more locking screws were placed proximally as well.  The wound was then thoroughly irrigated. Final stress radiography was then carried out.  Stress radiographs were then obtained under live fluoro showing no widening of the SL interval. I did not see any carpal dissociation with good fixation, without any evidence of penetration in the articular margin with the  locking pegs.   Postop, the pronator quadratus was then closed with 2-0 Vicryl.  Tourniquet was then deflated. Hemostasis was then obtained. The  subcutaneous tissues closed  with 4-0 Vicryl and skin closed with simple  nylon sutures. Adaptic dressing and sterile compressive bandage was  then applied. The patient was then placed in a well-padded volar splint. Extubated and taken to recovery room in good condition.    INTRAOPERATIVE RADIOGRAPHS:, 3 views of the wrist do show  the volar plate fixation in place. There is good position in both  planes.    POSTOPERATIVE PLAN: The patient will be admitted for IV antibiotics and  pain control; discharged in the morning. Seen back in the office for  approximately 10-14 days for wound check, suture removal, and then x-  rays, short-arm cast for total 4 weeks, and then begin a therapy regimen  around a 4-week mark. Radiographs at each visit.    Melrose Nakayama, MD

## 2013-07-03 NOTE — Op Note (Signed)
NAMENYESHA, CLIFF NO.:  [id number]  MEDICAL RECORD NO.:  [id number]  LOCATION:  5N26C                        FACILITY:  [facility]  PHYSICIAN:  Melrose Nakayama, MD  DATE OF BIRTH:  1917-03-15  DATE OF PROCEDURE:  07/01/2013 DATE OF DISCHARGE:                              OPERATIVE REPORT   ADDENDUM:  PREOPERATIVE DIAGNOSIS:  Right distal ulna shaft fracture.  POSTOPERATIVE DIAGNOSIS:  Right distal ulna shaft fractured.  PROCEDURE DESCRIPTION:  The patient did have a closed distal ulna shaft fracture.  This reduced well after internal fixation of distal radius. Therefore continuation of the closed treatment of distal ulnar fracture was then carried out.  No fixation was used for the distal ulna.  We will continue to manage this in a closed fashion.     Melrose Nakayama, MD     FWO/MEDQ  D:  07/03/2013  T:  07/03/2013  Job:  [id number]

## 2013-07-03 NOTE — Anesthesia Procedure Notes (Signed)
Anesthesia Regional Block:  Supraclavicular block  Pre-Anesthetic Checklist: ,, timeout performed, Correct Patient, Correct Site, Correct Laterality, Correct Procedure, Correct Position, site marked, Risks and benefits discussed,  Surgical consent,  Pre-op evaluation,  At surgeon's request and post-op pain management  Laterality: Right  Prep: chloraprep       Needles:  Injection technique: Single-shot  Needle Type: Echogenic Stimulator Needle     Needle Length: 5cm 5 cm Needle Gauge: 22 and 22 G    Additional Needles:  Procedures: ultrasound guided (picture in chart) and nerve stimulator Supraclavicular block  Nerve Stimulator or Paresthesia:  Response: biceps flexion, 0.45 mA,   Additional Responses:   Narrative:  Start time: 07/03/2013 11:47 AM End time: 07/03/2013 11:57 AM Injection made incrementally with aspirations every 5 mL.  Performed by: Personally  Anesthesiologist: Dr Marcie Bal  Additional Notes: Functioning IV was confirmed and monitors were applied.  A 107mm 22ga Arrow echogenic stimulator needle was used. Sterile prep and drape,hand hygiene and sterile gloves were used.  Negative aspiration and negative test dose prior to incremental administration of local anesthetic. The patient tolerated the procedure well.  Ultrasound guidance: relevent anatomy identified, needle position confirmed, local anesthetic spread visualized around nerve(s), vascular puncture avoided.  Image printed for medical record.

## 2013-07-03 NOTE — H&P (Signed)
Cathy Wallace is an 80 y.o. female.   Chief Complaint: Fall on outstretched right wrist HPI: Pt fell on outstretched right wrist Pt seen/evaluated in office Pt with no previous injury to right wrist Pt here for surgery  Past Medical History  Diagnosis Date  . DVT (deep venous thrombosis) 2011  . Pulmonary embolism Jan 2012  . HTN (hypertension)   . Spinal stenosis   . Bradycardia, on admit 08/09/2012  . Acute MI 08/10/2012    2D Echo - EF 55-60%, normal  . Pacemaker   . Depression   . GERD (gastroesophageal reflux disease)   . Cancer     hx of breast cancer  . Arthritis   . Closed pelvic fracture 11/11/2012  . Alcohol dependence 11/15/2012  . Long term (current) use of anticoagulants 11/15/2012  . Sacral fracture, closed 11/15/2012    Bilateral nondisplaced sacral fractures.   . L5 vertebral fracture 11/15/2012    Fracture of the L5 left transverse process.    . Anemia 11/15/2012  . B12 deficiency 12/01/2012  . Insomnia 12/01/2012  . Unspecified vitamin D deficiency 12/07/2012  . Anxiety   . Osteoarthritis 02/10/2013  . Lumbar vertebral fracture 06/15/2013    L3-4 12/15/20143  . Anginal pain   . Hyperlipemia     Past Surgical History  Procedure Laterality Date  . Abdominal hysterectomy    . Elbow surgery    . Cataract extraction Bilateral   . Cardiac catheterization  08/08/2012    2.5x68m Emerge balloon predilation, 2.75x372mVeriFLEX bare-metal stent was inserted into the RCA to cover proximal to mid segments of RCA deployed with Noncompliant Sprinter balloon [id number] to 2.2m55mlated    History reviewed. No pertinent family history. Social History:  reports that she has never smoked. She has never used smokeless tobacco. She reports that she drinks alcohol. She reports that she does not use illicit drugs.  Allergies:  Allergies  Allergen Reactions  . Aspirin Other (See Comments)    unknown  . Benadryl [Diphenhydramine Hcl] Other (See Comments)    unknown  .  Codeine Nausea And Vomiting  . Flu Virus Vaccine Other (See Comments)    unknown  . Pneumococcal Vaccines Other (See Comments)    unknown  . Penicillins Rash    Medications Prior to Admission  Medication Sig Dispense Refill  . acetaminophen (TYLENOL) 325 MG tablet Take 650 mg by mouth every 6 (six) hours as needed for mild pain or moderate pain.       . diMarland Kitchentiazem (CARDIZEM CD) 180 MG 24 hr capsule Take 180 mg by mouth daily.      . LOMarland Kitchenazepam (ATIVAN) 1 MG tablet Take 1 mg by mouth daily.       . Melatonin 3 MG TABS Take 1 tablet by mouth at bedtime.      . metoprolol tartrate (LOPRESSOR) 25 MG tablet Take 12.5 mg by mouth 2 (two) times daily.       . oxMarland KitchenCODONE-acetaminophen (PERCOCET/ROXICET) 5-325 MG per tablet Take 1 tablet by mouth every 8 (eight) hours as needed for severe pain.  6 tablet  0  . pantoprazole (PROTONIX) 40 MG tablet Take 1 tablet (40 mg total) by mouth daily at 12 noon.  30 tablet  1  . Rivaroxaban (XARELTO) 20 MG TABS tablet Take 20 mg by mouth daily.      . sertraline (ZOLOFT) 100 MG tablet Take 1 tablet (100 mg total) by mouth daily.  30 tablet  3  .  triamterene-hydrochlorothiazide (DYAZIDE) 37.5-25 MG per capsule Take 1 capsule by mouth daily.       Marland Kitchen loratadine (CLARITIN) 10 MG tablet Take 10 mg by mouth daily as needed for allergies.       Marland Kitchen LORazepam (ATIVAN) 0.5 MG tablet Take 0.5 mg by mouth every 6 (six) hours as needed for anxiety.      Marland Kitchen LORazepam (ATIVAN) 2 MG tablet Take 2 mg by mouth at bedtime.      . nitroGLYCERIN (NITROSTAT) 0.4 MG SL tablet Place 1 tablet (0.4 mg total) under the tongue every 5 (five) minutes x 3 doses as needed for chest pain.  25 tablet  12  . polyethylene glycol (MIRALAX / GLYCOLAX) packet Take 17 g by mouth daily as needed for mild constipation.        Results for orders placed during the hospital encounter of 07/03/13 (from the past 48 hour(s))  BASIC METABOLIC PANEL     Status: Abnormal   Collection Time    07/03/13 10:49 AM       Result Value Range   Sodium 137  137 - 147 mEq/L   Comment: Please note change in reference range.   Potassium 3.5 (*) 3.7 - 5.3 mEq/L   Comment: Please note change in reference range.   Chloride 96  96 - 112 mEq/L   CO2 25  19 - 32 mEq/L   Glucose, Bld 144 (*) 70 - 99 mg/dL   BUN 30 (*) 6 - 23 mg/dL   Creatinine, Ser 1.13 (*) 0.50 - 1.10 mg/dL   Calcium 9.7  8.4 - 10.5 mg/dL   GFR calc non Af Amer 40 (*) >90 mL/min   GFR calc Af Amer 47 (*) >90 mL/min   Comment: (NOTE)     The eGFR has been calculated using the CKD EPI equation.     This calculation has not been validated in all clinical situations.     eGFR's persistently <90 mL/min signify possible Chronic Kidney     Disease.  CBC     Status: Abnormal   Collection Time    07/03/13 10:49 AM      Result Value Range   WBC 9.8  4.0 - 10.5 K/uL   RBC 4.58  3.87 - 5.11 MIL/uL   Hemoglobin 13.9  12.0 - 15.0 g/dL   HCT 41.4  36.0 - 46.0 %   MCV 90.4  78.0 - 100.0 fL   MCH 30.3  26.0 - 34.0 pg   MCHC 33.6  30.0 - 36.0 g/dL   RDW 16.2 (*) 11.5 - 15.5 %   Platelets 311  150 - 400 K/uL  PROTIME-INR     Status: Abnormal   Collection Time    07/03/13 10:49 AM      Result Value Range   Prothrombin Time 18.3 (*) 11.6 - 15.2 seconds   INR 1.57 (*) 0.00 - 1.49   Dg Chest 2 View  07/03/2013   CLINICAL DATA:  Preop for wrist fracture history of hypertension  EXAM: CHEST  2 VIEW  COMPARISON:  08/09/2012, 07/24/10  FINDINGS: Mild cardiac enlargement stable. Uncoiling of the aorta with aortic arch calcification, stable. Deformity posterior lateral left 5th rib appears chronic. Vascular pattern is normal and lungs are clear. No pleural effusions. Mild to moderate compression deformity mid thoracic spine. Moderate compression deformity thoracolumbar junction. When compared to 2012, the thoracolumbar junction compression deformity appears stable. Central thoracic compression deformity appears new.  IMPRESSION: No acute cardiopulmonary process.  Stable thoracolumbar compression deformity. New (when compared to 2012) central thoracic spine vertebral body compression deformity, of otherwise uncertain age.   Electronically Signed   By: Skipper Cliche M.D.   On: 07/03/2013 12:17    ROS NO RECENT ILLNESSES OR HOSPITALIZATIONS  Blood pressure 122/39, pulse 33, temperature 98.7 F (37.1 C), temperature source Oral, resp. rate 19, SpO2 99.00%. Physical Exam  General Appearance:  Alert, cooperative, no distress, appears stated age  Head:  Normocephalic, without obvious abnormality, atraumatic  Eyes:  Pupils equal, conjunctiva/corneas clear,         Throat: Lips, mucosa, and tongue normal; teeth and gums normal  Neck: No visible masses     Lungs:   respirations unlabored  Chest Wall:  No tenderness or deformity  Heart:  Regular rate and rhythm,  Abdomen:   Soft, non-tender,         Extremities: ULA:GTXMI AMOUNT SWELLING DORSUM OF HAND AND FINGERS. FINGERS WARM WELL PERFUSED  Pulses: 2+ and symmetric  Skin: Skin color, texture, turgor normal, no rashes or lesions     Neurologic: Normal    Assessment/Plan RIGHT WRIST DISTAL RADIUS FRACTURE  RIGHT WRIST OPEN REDUCTION AND INTERNAL FIXATION AND REPAIR AS INDICATED  R/B/A DISCUSSED WITH PT IN OFFICE.  PT VOICED UNDERSTANDING OF PLAN CONSENT SIGNED DAY OF SURGERY PT SEEN AND EXAMINED PRIOR TO OPERATIVE PROCEDURE/DAY OF SURGERY SITE MARKED. QUESTIONS ANSWERED WILL REMAIN AN INPATIENT OBSERVATION FOLLOWING SURGERY  Linna Hoff 07/03/2013, 1:08 PM

## 2013-07-03 NOTE — Progress Notes (Signed)
Patient with intermittent ventricular bigeminy. Dr. Cherene Altes made aware. Patient asymptomatic

## 2013-07-03 NOTE — Brief Op Note (Signed)
07/03/2013  1:21 PM  PATIENT:  Cathy Wallace  80 y.o. female  PRE-OPERATIVE DIAGNOSIS:  Right wrist distal radius fracture  POST-OPERATIVE DIAGNOSIS:  right wrist distal radius fracture  PROCEDURE:  Procedure(s): OPEN REDUCTION INTERNAL FIXATION (ORIF) RIGHT WRIST FRACTURE (Right)  SURGEON:  Surgeon(s) and Role:    * Linna Hoff, MD - Primary  PHYSICIAN ASSISTANT:    ASSISTANTS: none   ANESTHESIA:   general  EBL:     BLOOD ADMINISTERED:none  DRAINS: none   LOCAL MEDICATIONS USED:  MARCAINE     SPECIMEN:  No Specimen  DISPOSITION OF SPECIMEN:  N/A  COUNTS:  YES  TOURNIQUET:  * Missing tourniquet times found for documented tourniquets in log:  [id number] *  DICTATION: Typed in note  PLAN OF CARE: Admit for overnight observation  PATIENT DISPOSITION:  PACU - hemodynamically stable.   Delay start of Pharmacological VTE agent (>24hrs) due to surgical blood loss or risk of bleeding: not applicable

## 2013-07-03 NOTE — Anesthesia Preprocedure Evaluation (Addendum)
Anesthesia Evaluation  Patient identified by MRN, date of birth, ID band Patient awake    Reviewed: Allergy & Precautions, H&P , NPO status , Patient's Chart, lab work & pertinent test results, reviewed documented beta blocker date and time   Airway Mallampati: II  Neck ROM: full    Dental  (+) Teeth Intact and Dental Advisory Given   Pulmonary PE         Cardiovascular hypertension, Pt. on home beta blockers and Pt. on medications + angina + CAD, + Past MI and DVT + dysrhythmias  2D Echo - EF 55-60%, normal   Neuro/Psych Anxiety Depression    GI/Hepatic GERD-  ,  Endo/Other    Renal/GU      Musculoskeletal  (+) Arthritis -, Osteoarthritis,    Abdominal   Peds  Hematology  (+) anemia ,   Anesthesia Other Findings   Reproductive/Obstetrics                        Anesthesia Physical Anesthesia Plan  ASA: III  Anesthesia Plan: MAC and Regional   Post-op Pain Management: MAC Combined w/ Regional for Post-op pain   Induction: Intravenous  Airway Management Planned: Simple Face Mask  Additional Equipment:   Intra-op Plan:   Post-operative Plan:   Informed Consent: I have reviewed the patients History and Physical, chart, labs and discussed the procedure including the risks, benefits and alternatives for the proposed anesthesia with the patient or authorized representative who has indicated his/her understanding and acceptance.   Dental advisory given  Plan Discussed with: CRNA, Anesthesiologist and Surgeon  Anesthesia Plan Comments:        Anesthesia Quick Evaluation

## 2013-07-04 MED ORDER — OXYCODONE HCL 5 MG PO TABS: 5.0000 mg | ORAL_TABLET | ORAL | Status: DC | PRN

## 2013-07-04 NOTE — Progress Notes (Signed)
This encounter was created in error - please disregard.

## 2013-07-04 NOTE — Progress Notes (Signed)
PT Cancellation/Discharge Note  Patient Details Name: [name] MRN: [id number] DOB: [birth year]   Cancelled Treatment:    Reason Eval/Treat Not Completed: Pain limiting ability to participate;Fatigue/lethargy limiting ability to participate.  Attempted to see patient at 10:46 this am.  Patient reports she had already worked with OT and could not work with me now.  Patient very focused on her discharge, stating she needs 24 hour help.  Spoke with OT - patient will need f/u PT for mobility training.  Recommend this f/u occur at the SNF at discharge.  Will defer PT eval to that setting.  Per SW, patient to discharge to SNF today.   Despina Pole 07/04/2013, 1:32 PM Carita Pian. Sanjuana Kava, [place] Pager [phone]

## 2013-07-04 NOTE — Discharge Summary (Signed)
Physician Discharge Summary  Patient ID: [name] MRN: [id number] DOB/AGE: July 31, 1916 80 y.o.  Admit date: 07/03/2013 Discharge date: 07/04/2013  Admission Diagnoses: Right wrist distal radius fracture Past Medical History  Diagnosis Date  . DVT (deep venous thrombosis) 2011  . Pulmonary embolism Jan 2012  . HTN (hypertension)   . Spinal stenosis   . Bradycardia, on admit 08/09/2012  . Acute MI 08/10/2012    2D Echo - EF 55-60%, normal  . Pacemaker   . Depression   . GERD (gastroesophageal reflux disease)   . Cancer     hx of breast cancer  . Arthritis   . Closed pelvic fracture 11/11/2012  . Alcohol dependence 11/15/2012  . Long term (current) use of anticoagulants 11/15/2012  . Sacral fracture, closed 11/15/2012    Bilateral nondisplaced sacral fractures.   . L5 vertebral fracture 11/15/2012    Fracture of the L5 left transverse process.    . Anemia 11/15/2012  . B12 deficiency 12/01/2012  . Insomnia 12/01/2012  . Unspecified vitamin D deficiency 12/07/2012  . Anxiety   . Osteoarthritis 02/10/2013  . Lumbar vertebral fracture 06/15/2013    L3-4 12/15/20143  . Anginal pain   . Hyperlipemia     Discharge Diagnoses:  Active Problems:   Fracture of right distal radius   Surgeries: Procedure(s): OPEN REDUCTION INTERNAL FIXATION (ORIF) RIGHT WRIST FRACTURE on 07/03/2013    Consultants:    Discharged Condition: Improved  Hospital Course: Cathy Wallace is an 80 y.o. female who was admitted 07/03/2013 with a chief complaint of No chief complaint on file. , and found to have a diagnosis of Right wrist distal radius fracture.  They were brought to the operating room on 07/03/2013 and underwent Procedure(s): OPEN REDUCTION INTERNAL FIXATION (ORIF) RIGHT WRIST FRACTURE.    They were given perioperative antibiotics: Anti-infectives   Start     Dose/Rate Route Frequency Ordered Stop   07/03/13 2200  clindamycin (CLEOCIN) IVPB 600 mg     600 mg 100 mL/hr over 30 Minutes Intravenous  3 times per day 07/03/13 1632     07/03/13 1100  clindamycin (CLEOCIN) IVPB 900 mg     900 mg 100 mL/hr over 30 Minutes Intravenous On call to O.R. 07/03/13 1043 07/03/13 1340    .  They were given sequential compression devices, early ambulation, and Xarelto for DVT prophylaxis.  Recent vital signs: Patient Vitals for the past 24 hrs:  BP Temp Temp src Pulse Resp SpO2 Height Weight  07/04/13 0622 146/54 mmHg 97.7 F (36.5 C) Oral 50 16 96 % - -  07/03/13 2100 125/41 mmHg 97.3 F (36.3 C) Oral 53 16 94 % - -  07/03/13 1700 138/55 mmHg - - - - - - -  07/03/13 1621 170/53 mmHg 97.2 F (36.2 C) - 57 14 99 % 5\' 3"  (1.6 m) 51.256 kg (113 lb)  07/03/13 1555 - 97.5 F (36.4 C) - - - - - -  07/03/13 1553 135/49 mmHg - - - 14 - - -  07/03/13 1539 141/50 mmHg - - 49 12 92 % - -  07/03/13 1524 128/48 mmHg - - 51 13 96 % - -  07/03/13 1509 132/50 mmHg - - 51 11 91 % - -  07/03/13 1506 - 97.7 F (36.5 C) - - - - - -  07/03/13 1230 - - - 33 19 99 % - -  07/03/13 1229 - - - 63 16 97 % - -  07/03/13 1228  122/39 mmHg - - 66 15 96 % - -  07/03/13 1227 - - - 41 16 99 % - -  07/03/13 1226 - - - 53 15 98 % - -  07/03/13 1225 - - - 34 19 98 % - -  07/03/13 1224 - - - 33 16 97 % - -  07/03/13 1223 137/29 mmHg - - 34 17 97 % - -  07/03/13 1222 - - - 34 16 95 % - -  07/03/13 1221 - - - 33 18 97 % - -  07/03/13 1220 - - - 33 16 97 % - -  07/03/13 1219 - - - 32 16 97 % - -  07/03/13 1218 126/19 mmHg - - 63 16 95 % - -  07/03/13 1217 - - - 31 15 95 % - -  07/03/13 1216 - - - 31 16 96 % - -  07/03/13 1215 - - - 32 16 96 % - -  07/03/13 1214 - - - 31 16 96 % - -  07/03/13 1213 135/23 mmHg - - 31 17 96 % - -  07/03/13 1212 - - - 31 17 93 % - -  07/03/13 1210 - - - 31 14 98 % - -  07/03/13 1209 - - - 32 17 96 % - -  07/03/13 1208 136/28 mmHg - - 29 24 93 % - -  07/03/13 1207 - - - 30 30 94 % - -  07/03/13 1206 - - - 29 18 95 % - -  07/03/13 1205 - - - 30 30 97 % - -  07/03/13 1204 129/34 mmHg - -  29 18 96 % - -  07/03/13 1203 - - - 29 21 92 % - -  07/03/13 1202 - - - 49 17 96 % - -  07/03/13 1201 - - - 48 14 96 % - -  07/03/13 1200 - - - 48 15 97 % - -  07/03/13 1159 - - - 47 16 97 % - -  07/03/13 1158 - - - 47 11 97 % - -  07/03/13 1157 141/45 mmHg - - 47 20 98 % - -  07/03/13 1156 - - - 47 18 99 % - -  07/03/13 1155 - - - 47 18 99 % - -  07/03/13 1154 - - - 47 8 100 % - -  07/03/13 1153 - - - 47 12 100 % - -  07/03/13 1152 143/29 mmHg - - 48 9 97 % - -  07/03/13 1151 - - - 48 13 99 % - -  07/03/13 1150 - - - 48 14 99 % - -  07/03/13 1149 - - - 48 12 97 % - -  07/03/13 1148 - - - 48 10 97 % - -  07/03/13 1147 155/34 mmHg - - 49 11 97 % - -  07/03/13 1126 125/74 mmHg 98.7 F (37.1 C) Oral 49 16 97 % - -  .  Recent laboratory studies: Dg Chest 2 View  07/03/2013   CLINICAL DATA:  Preop for wrist fracture history of hypertension  EXAM: CHEST  2 VIEW  COMPARISON:  08/09/2012, 07/24/10  FINDINGS: Mild cardiac enlargement stable. Uncoiling of the aorta with aortic arch calcification, stable. Deformity posterior lateral left 5th rib appears chronic. Vascular pattern is normal and lungs are clear. No pleural effusions. Mild to moderate compression deformity mid thoracic spine. Moderate compression deformity thoracolumbar junction. When compared to  2012, the thoracolumbar junction compression deformity appears stable. Central thoracic compression deformity appears new.  IMPRESSION: No acute cardiopulmonary process. Stable thoracolumbar compression deformity. New (when compared to 2012) central thoracic spine vertebral body compression deformity, of otherwise uncertain age.   Electronically Signed   By: Skipper Cliche M.D.   On: 07/03/2013 12:17    Discharge Medications:     Medication List         acetaminophen 325 MG tablet  Commonly known as:  TYLENOL  Take 650 mg by mouth every 6 (six) hours as needed for mild pain or moderate pain.     diltiazem 180 MG 24 hr capsule  Commonly  known as:  CARDIZEM CD  Take 180 mg by mouth daily.     loratadine 10 MG tablet  Commonly known as:  CLARITIN  Take 10 mg by mouth daily as needed for allergies.     LORazepam 0.5 MG tablet  Commonly known as:  ATIVAN  Take 0.5 mg by mouth every 6 (six) hours as needed for anxiety.     LORazepam 2 MG tablet  Commonly known as:  ATIVAN  Take 2 mg by mouth at bedtime.     LORazepam 1 MG tablet  Commonly known as:  ATIVAN  Take 1 mg by mouth daily.     Melatonin 3 MG Tabs  Take 1 tablet by mouth at bedtime.     metoprolol tartrate 25 MG tablet  Commonly known as:  LOPRESSOR  Take 12.5 mg by mouth 2 (two) times daily.     nitroGLYCERIN 0.4 MG SL tablet  Commonly known as:  NITROSTAT  Place 1 tablet (0.4 mg total) under the tongue every 5 (five) minutes x 3 doses as needed for chest pain.     oxyCODONE-acetaminophen 5-325 MG per tablet  Commonly known as:  PERCOCET/ROXICET  Take 1 tablet by mouth every 8 (eight) hours as needed for severe pain.     pantoprazole 40 MG tablet  Commonly known as:  PROTONIX  Take 1 tablet (40 mg total) by mouth daily at 12 noon.     polyethylene glycol packet  Commonly known as:  MIRALAX / GLYCOLAX  Take 17 g by mouth daily as needed for mild constipation.     sertraline 100 MG tablet  Commonly known as:  ZOLOFT  Take 1 tablet (100 mg total) by mouth daily.     triamterene-hydrochlorothiazide 37.5-25 MG per capsule  Commonly known as:  DYAZIDE  Take 1 capsule by mouth daily.     XARELTO 20 MG Tabs tablet  Generic drug:  Rivaroxaban  Take 20 mg by mouth daily.        Diagnostic Studies: Dg Chest 2 View  07/03/2013   CLINICAL DATA:  Preop for wrist fracture history of hypertension  EXAM: CHEST  2 VIEW  COMPARISON:  08/09/2012, 07/24/10  FINDINGS: Mild cardiac enlargement stable. Uncoiling of the aorta with aortic arch calcification, stable. Deformity posterior lateral left 5th rib appears chronic. Vascular pattern is normal and lungs are  clear. No pleural effusions. Mild to moderate compression deformity mid thoracic spine. Moderate compression deformity thoracolumbar junction. When compared to 2012, the thoracolumbar junction compression deformity appears stable. Central thoracic compression deformity appears new.  IMPRESSION: No acute cardiopulmonary process. Stable thoracolumbar compression deformity. New (when compared to 2012) central thoracic spine vertebral body compression deformity, of otherwise uncertain age.   Electronically Signed   By: Skipper Cliche M.D.   On: 07/03/2013 12:17  Dg Lumbar Spine 2-3 Views  06/14/2013   CLINICAL DATA:  Fall  EXAM: LUMBAR SPINE - 2-3 VIEW  COMPARISON:  11/10/2012  FINDINGS: Osteopenia. Severe L1 compression deformity is stable. Mild L3 compression fracture has worsened. L4 superior endplate compression fracture has developed. L5 is intact.  IMPRESSION: New compression fractures involving L3 and L4 as described. MRI may be helpful to better characterize.   Electronically Signed   By: Maryclare Bean M.D.   On: 06/14/2013 12:47   Dg Pelvis 1-2 Views  06/14/2013   CLINICAL DATA:  Fall.  Back pain.  EXAM: PELVIS - 1-2 VIEW  COMPARISON:  11/11/2012.  FINDINGS: Remote fracture of the right pubic region with incomplete healing and separation of fracture fragments.  No acute pelvic or hip fracture is detected. Bones are osteopenic an if there are persistent symptoms felt to be related to the pelvis/hips, further imaging may be considered.  Degenerative changes lumbar vertebra with L4 compression fracture. Please see accompanying lumbar spine plain film report.  Vascular calcifications.  IMPRESSION: Remote fracture of the right pubic region with incomplete healing and separation of fracture fragments.  No acute pelvic or hip fracture is detected. Bones are osteopenic an if there are persistent symptoms felt to be related to the pelvis/hips, further imaging may be considered.  Degenerative changes lumbar  vertebra with L4 compression fracture. Please see accompanying lumbar spine plain film report.   Electronically Signed   By: Chauncey Cruel M.D.   On: 06/14/2013 12:59   Dg Wrist Complete Left  06/14/2013   CLINICAL DATA:  Fall, hematoma  EXAM: LEFT WRIST - COMPLETE 3+ VIEW  COMPARISON:  None.  FINDINGS: Four views of left wrist submitted. No acute fracture or subluxation. Diffuse osteopenia. Narrowing of radiocarpal joint space.  IMPRESSION: Negative.  Diffuse osteopenia.   Electronically Signed   By: Lahoma Crocker M.D.   On: 06/14/2013 12:48   Dg Wrist Complete Right  06/28/2013   CLINICAL DATA:  Wrist injury with pain and swelling after a fall.  EXAM: RIGHT WRIST - COMPLETE 3+ VIEW  COMPARISON:  None.  FINDINGS: Diffuse bone demineralization. Mostly transverse fractures of the distal right radius and ulna with dorsal displacement and angulation of the distal fracture fragments. Fracture lines do not appear to extend to the radiocarpal joint. Fracture of the ulnar styloid process is also present. Underlying degenerative changes in the carpus. Soft tissue swelling.  IMPRESSION: Displaced fractures of the distal right radius and ulnar metaphysis with additional fracture of the ulnar styloid process.   Electronically Signed   By: Lucienne Capers M.D.   On: 06/28/2013 23:59   Dg Ankle Complete Right  06/29/2013   CLINICAL DATA:  Right ankle pain and swelling after a fall.  EXAM: RIGHT ANKLE - COMPLETE 3+ VIEW  COMPARISON:  None.  FINDINGS: Diffuse bone demineralization. No evidence of acute fracture or dislocation. Vascular calcifications are present. Minimal plantar calcaneal spur.  IMPRESSION: No acute fractures demonstrated appear   Electronically Signed   By: Lucienne Capers M.D.   On: 06/29/2013 00:03   Ct Head Wo Contrast  06/28/2013   CLINICAL DATA:  Fall.  EXAM: CT HEAD WITHOUT CONTRAST  TECHNIQUE: Contiguous axial images were obtained from the base of the skull through the vertex without  intravenous contrast.  COMPARISON:  06/14/2013  FINDINGS: Diffuse cerebral atrophy. Ventricular dilatation consistent with central atrophy. Low-attenuation changes in the deep white matter consistent with small vessel ischemia. No mass effect or midline shift.  No abnormal extra-axial fluid collections. Gray-white matter junctions are distinct. Basal cisterns are not effaced. No evidence of acute intracranial hemorrhage. No depressed skull fractures. Partial opacification of the sphenoid sinus. The paranasal sinuses and mastoid air cells are otherwise not opacified. Vascular calcifications. Stable appearance since previous study.  IMPRESSION: No acute intracranial abnormalities. Stable appearance of chronic atrophy and small vessel ischemic changes.   Electronically Signed   By: Lucienne Capers M.D.   On: 06/28/2013 23:58   Ct Head Wo Contrast  06/14/2013   CLINICAL DATA:  Fall, confusion  EXAM: CT HEAD WITHOUT CONTRAST  CT CERVICAL SPINE WITHOUT CONTRAST  TECHNIQUE: Multidetector CT imaging of the head and cervical spine was performed following the standard protocol without intravenous contrast. Multiplanar CT image reconstructions of the cervical spine were also generated.  COMPARISON:  07/24/2010  FINDINGS: CT HEAD FINDINGS  No skull fracture is noted. There is mucosal thickening with air-fluid level left sphenoid sinus. Mastoid air cells are unremarkable.  No intracranial hemorrhage, mass effect or midline shift. Cerebral atrophy again noted. Again noted periventricular and subcortical white matter decreased attenuation consistent with chronic small vessel ischemic changes. No acute cortical infarction. No mass lesion is noted on this unenhanced scan.  CT CERVICAL SPINE FINDINGS  Axial images of the cervical spine shows diffuse osteopenia. Computer processed images shows no acute fracture or subluxation. Degenerative changes C1-C2 articulation. Disc space flattening with mild anterior and mild posterior  spurring at C3-C4-C4-C5 C5-C6 and C6-C7 level. No prevertebral soft tissue swelling. Cervical airway is patent.  IMPRESSION: 1. No acute intracranial abnormality. Stable atrophy and chronic white matter disease. 2. No cervical spine acute fracture or subluxation. Degenerative changes as described above.   Electronically Signed   By: Lahoma Crocker M.D.   On: 06/14/2013 13:03   Ct Cervical Spine Wo Contrast  06/14/2013   CLINICAL DATA:  Fall, confusion  EXAM: CT HEAD WITHOUT CONTRAST  CT CERVICAL SPINE WITHOUT CONTRAST  TECHNIQUE: Multidetector CT imaging of the head and cervical spine was performed following the standard protocol without intravenous contrast. Multiplanar CT image reconstructions of the cervical spine were also generated.  COMPARISON:  07/24/2010  FINDINGS: CT HEAD FINDINGS  No skull fracture is noted. There is mucosal thickening with air-fluid level left sphenoid sinus. Mastoid air cells are unremarkable.  No intracranial hemorrhage, mass effect or midline shift. Cerebral atrophy again noted. Again noted periventricular and subcortical white matter decreased attenuation consistent with chronic small vessel ischemic changes. No acute cortical infarction. No mass lesion is noted on this unenhanced scan.  CT CERVICAL SPINE FINDINGS  Axial images of the cervical spine shows diffuse osteopenia. Computer processed images shows no acute fracture or subluxation. Degenerative changes C1-C2 articulation. Disc space flattening with mild anterior and mild posterior spurring at C3-C4-C4-C5 C5-C6 and C6-C7 level. No prevertebral soft tissue swelling. Cervical airway is patent.  IMPRESSION: 1. No acute intracranial abnormality. Stable atrophy and chronic white matter disease. 2. No cervical spine acute fracture or subluxation. Degenerative changes as described above.   Electronically Signed   By: Lahoma Crocker M.D.   On: 06/14/2013 13:03   Dg Hand Complete Left  06/14/2013   CLINICAL DATA:  Fall, hematoma  EXAM:  LEFT HAND - COMPLETE 3+ VIEW  COMPARISON:  None.  FINDINGS: Three views of left hand submitted. There is diffuse osteopenia. Extensive degenerative changes distal interphalangeal joints. No acute fracture or subluxation. Narrowing of radiocarpal joint space.  IMPRESSION: No acute fracture or subluxation. Diffuse osteopenia.  Degenerative changes as described above.   Electronically Signed   By: Lahoma Crocker M.D.   On: 06/14/2013 12:48    They benefited maximally from their hospital stay and there were no complications.     Disposition: 01-Home or Self Care      Future Appointments Provider Department Dept Phone   07/05/2013 3:45 PM Estill Dooms, MD [facility] [phone]   09/08/2013 11:00 AM Mardene Celeste, NP [facility] [phone]      PT SEEN/EXAMINED ON DAY OF DISCHARGE, PT READY TO GO BACK TO SKILLED NURSING FACILITY AT [facility] WILL NEED TO SEE BACK IN OFFICE IN 14 DAYS  Signed: Linna Hoff 07/04/2013, 10:32 AM

## 2013-07-04 NOTE — Discharge Instructions (Signed)
KEEP BANDAGE CLEAN AND DRY CALL OFFICE FOR F/U APPT [phone] IN 2 WEEKS NO WEIGHT ON RIGHT WRIST KEEP HAND ELEVATED AND OK TO MOVE FINGERS AS MUCH AS YOU WANT KEEP HAND ELEVATED ABOVE HEART OK TO APPLY ICE TO OPERATIVE AREA CONTACT OFFICE IF ANY WORSENING PAIN OR CONCERNS.

## 2013-07-04 NOTE — Progress Notes (Signed)
Patient is medically stable for D/C back to [facility]. Clinical Education officer, museum (CSW) prepared D/C packet and confirmed with Lytle Butte RN supervisor at [facility] that patient can return to the rehab center today. Patient is being transported by [facility] security in a [place]. Security reported that they have an emergency and would not be able to come get the patient for an hour to an hour and a half. CSW made patient aware of above. Patient's son Fritz Pickerel and his wife are at bedside. Nursing is aware of above. Please reconsult if further social work needs arise. CSW signing off.   Blima Rich, [place]end CSW [phone]

## 2013-07-04 NOTE — Evaluation (Signed)
Occupational Therapy Evaluation Patient Details Name: Cathy Wallace MRN: [id number] DOB: 26-Nov-1916 Today's Date: 07/04/2013 Time: 4268-3419 OT Time Calculation (min): 30 min  OT Assessment / Plan / Recommendation History of present illness 80 y.o. s/p OPEN REDUCTION INTERNAL FIXATION (ORIF) RIGHT WRIST FRACTURE (Right)   Clinical Impression   Pt presents with below problem list. Recommending SNF and 24/7 supervision/assistance for d/c. Not setting goals at this time due to pt d/c summary already placed.     OT Assessment  All further OT needs can be met in the next venue of care    Follow Up Recommendations  SNF;Supervision/Assistance - 24 hour    Barriers to Discharge      Equipment Recommendations  Other (comment) (defer to next venue)    Recommendations for Other Services    Frequency       Precautions / Restrictions Precautions Precaution Comments: Elevation, edema control Required Braces or Orthoses: Sling (sling for comfort) Restrictions Weight Bearing Restrictions: Yes RUE Weight Bearing: Non weight bearing   Pertinent Vitals/Pain Pain 2/10 in RUE. Iced and elevated.     ADL  Grooming: Wash/dry face;Teeth care;Minimal assistance Where Assessed - Grooming: Supported sitting Upper Body Dressing: Moderate assistance Where Assessed - Upper Body Dressing: Supported sitting Lower Body Dressing: Maximal assistance Where Assessed - Lower Body Dressing: Supported sit to Lobbyist: Moderate assistance;Maximal assistance Toilet Transfer Method: Sit to stand;Stand pivot;Squat pivot Toilet Transfer Equipment: Other (comment) (bed <> recliner chair) Tub/Shower Transfer Method: Not assessed Equipment Used: Gait belt;Other (comment) (shoulder sling) Transfers/Ambulation Related to ADLs: Mod/Max A ADL Comments: Educated to elevate, ice and move digits on RUE to help with edema. Pt performed grooming sitting in chair. Educated on precautions. Assistance to  don/doff socks-cues for easier technique. Educated on positioning of sling. OT assisted in donning sling.   OT Diagnosis: Acute pain;Generalized weakness  OT Problem List: Decreased strength;Decreased range of motion;Decreased activity tolerance;Impaired balance (sitting and/or standing);Decreased knowledge of use of DME or AE;Decreased knowledge of precautions;Pain;Impaired UE functional use;Impaired sensation;Increased edema OT Treatment Interventions:     OT Goals(Current goals can be found in the care plan section)    Visit Information  Last OT Received On: 07/04/13 Assistance Needed: +1 History of Present Illness: 80 y.o. s/p OPEN REDUCTION INTERNAL FIXATION (ORIF) RIGHT WRIST FRACTURE (Right)       Prior [place] expects to be discharged to:: Skilled nursing facility Living Arrangements:  Environmental education officer) Prior Function Level of Independence: Independent with assistive device(s) Comments: pt independent prior to two recent falls; was receiving therapy at [place]: No difficulties Dominant Hand: Right         Vision/Perception Vision - History Baseline Vision: Wears glasses only for reading   Cognition  Cognition Arousal/Alertness: Awake/alert Behavior During Therapy: WFL for tasks assessed/performed Overall Cognitive Status: Within Functional Limits for tasks assessed    Extremity/Trunk Assessment Upper Extremity Assessment Upper Extremity Assessment: RUE deficits/detail RUE: Unable to fully assess due to immobilization RUE Sensation: decreased light touch (in fifth digit) Lower Extremity Assessment Lower Extremity Assessment: Defer to PT evaluation     Mobility Bed Mobility Bed Mobility: Supine to Sit;Sit to Supine;Scooting to HOB Supine to Sit: 4: Min assist Sit to Supine: 4: Min guard Scooting to [facility]: 3: Mod assist Details for Bed Mobility Assistance: Cues to be sure she maintained NWB on  RUE. Transfers Transfers: Sit to Stand;Stand to Sit Sit to Stand: 3: Mod assist;From bed;From chair/3-in-1 Stand  to Sit: 3: Mod assist;To bed;To chair/3-in-1 Details for Transfer Assistance: Pt performed stand pivot from bed to chair and then squat pivot from chair back to bed at Max A level.     Exercise     Balance     End of Session OT - End of Session Equipment Utilized During Treatment: Gait belt Activity Tolerance: Patient tolerated treatment well Patient left: in bed;with call bell/phone within reach;with nursing/sitter in room Nurse Communication: Other (comment) (spoke with tech about transfer technique and NWB on RUE)  GO     Benito Mccreedy OTR/L [phone] 07/04/2013, 1:29 PM

## 2013-07-05 ENCOUNTER — Non-Acute Institutional Stay (SKILLED_NURSING_FACILITY): Payer: Medicare Other | Admitting: Geriatric Medicine

## 2013-07-05 ENCOUNTER — Encounter: Payer: Self-pay | Admitting: Geriatric Medicine

## 2013-07-05 ENCOUNTER — Encounter: Payer: Medicare Other | Admitting: Internal Medicine

## 2013-07-05 DIAGNOSIS — S52599A Other fractures of lower end of unspecified radius, initial encounter for closed fracture: Secondary | ICD-10-CM

## 2013-07-05 DIAGNOSIS — M21371 Foot drop, right foot: Secondary | ICD-10-CM

## 2013-07-05 DIAGNOSIS — I10 Essential (primary) hypertension: Secondary | ICD-10-CM

## 2013-07-05 DIAGNOSIS — S52501A Unspecified fracture of the lower end of right radius, initial encounter for closed fracture: Secondary | ICD-10-CM

## 2013-07-05 DIAGNOSIS — Z7901 Long term (current) use of anticoagulants: Secondary | ICD-10-CM

## 2013-07-05 DIAGNOSIS — F411 Generalized anxiety disorder: Secondary | ICD-10-CM

## 2013-07-05 DIAGNOSIS — M216X9 Other acquired deformities of unspecified foot: Secondary | ICD-10-CM

## 2013-07-05 NOTE — Assessment & Plan Note (Signed)
Anxiety is manageable with current medications and close assistance

## 2013-07-05 NOTE — Assessment & Plan Note (Signed)
Blood pressure well controlled, continue current medication

## 2013-07-05 NOTE — Assessment & Plan Note (Signed)
Noted just prior to last Rehab discharge. PT will continue to work with patient to improve safety with ambulation. Will require fitting of AFO.

## 2013-07-05 NOTE — Progress Notes (Signed)
Patient ID: Cathy Wallace, female   DOB: 1917-04-25, 80 y.o.   MRN: [id number]  [facility] (31)  Code Status: DNR, MOST form Contact Information   Name Relation Home Work [place] Other [phone]    Nadel,Beverly Daughter [phone]   [place] Other [phone]        Chief Complaint  Patient presents with  . Hospitalization Follow-up    Wrist fracture, right    HPI: This is a 80 y.o. female resident of [place],  Independent Living  Section who sustained a right wrist fracture with a fall at her home on 06/28/2013. She was evaluated by Dr. Apolonio Schneiders on January 2, he recommended ORIF of the wrist fracture. This procedure was completed on January 3, surgical procedure and immediate postop period were uneventful. Patient was discharged from the hospital return to the rehabilitation section at [place] on January 4.    Allergies  Allergen Reactions  . Aspirin Other (See Comments)    unknown  . Benadryl [Diphenhydramine Hcl] Other (See Comments)    unknown  . Codeine Nausea And Vomiting  . Flu Virus Vaccine Other (See Comments)    unknown  . Pneumococcal Vaccines Other (See Comments)    unknown  . Penicillins Rash      Medication List       This list is accurate as of: 07/05/13 12:47 PM.  Always use your most recent med list.               acetaminophen 325 MG tablet  Commonly known as:  TYLENOL  Take 650 mg by mouth every 6 (six) hours as needed for mild pain or moderate pain.     diltiazem 180 MG 24 hr capsule  Commonly known as:  CARDIZEM CD  Take 180 mg by mouth daily.     loratadine 10 MG tablet  Commonly known as:  CLARITIN  Take 10 mg by mouth daily as needed for allergies.     LORazepam 0.5 MG tablet  Commonly known as:  ATIVAN  Take 0.5 mg by mouth every 6 (six) hours as needed for anxiety.     LORazepam 2 MG tablet  Commonly known as:   ATIVAN  Take 2 mg by mouth at bedtime.     LORazepam 1 MG tablet  Commonly known as:  ATIVAN  Take 1 mg by mouth daily.     Melatonin 3 MG Tabs  Take 1 tablet by mouth at bedtime.     metoprolol tartrate 25 MG tablet  Commonly known as:  LOPRESSOR  Take 12.5 mg by mouth 2 (two) times daily.     nitroGLYCERIN 0.4 MG SL tablet  Commonly known as:  NITROSTAT  Place 1 tablet (0.4 mg total) under the tongue every 5 (five) minutes x 3 doses as needed for chest pain.     oxyCODONE 5 MG immediate release tablet  Commonly known as:  ROXICODONE  Take 1 tablet (5 mg total) by mouth every 4 (four) hours as needed for severe pain.     oxyCODONE-acetaminophen 5-325 MG per tablet  Commonly known as:  PERCOCET/ROXICET  Take 1 tablet by mouth every 8 (eight) hours as needed for severe pain.     pantoprazole 40 MG tablet  Commonly known as:  PROTONIX  Take 1 tablet (40 mg total) by mouth daily at 12 noon.     polyethylene glycol packet  Commonly known as:  MIRALAX / GLYCOLAX  Take 17 g by mouth daily as needed for mild constipation.     sertraline 100 MG tablet  Commonly known as:  ZOLOFT  Take 1 tablet (100 mg total) by mouth daily.     triamterene-hydrochlorothiazide 37.5-25 MG per capsule  Commonly known as:  DYAZIDE  Take 1 capsule by mouth daily.     XARELTO 20 MG Tabs tablet  Generic drug:  Rivaroxaban  Take 20 mg by mouth daily.         DATA REVIEWED  Radiologic Exams 06/28/2013  CT HEAD WITHOUT CONTRAST  COMPARISON:  06/14/2013 IMPRESSION: No acute intracranial abnormalities. Stable appearance of chronic atrophy and small vessel ischemic changes.     RIGHT WRIST - COMPLETE 3+ VIEW  COMPARISON:  None. IMPRESSION: Displaced fractures of the distal right radius and ulnar metaphysis with additional fracture of the ulnar styloid process.   RIGHT ANKLE - COMPLETE 3+ VIEW COMPARISON:  None.  IMPRESSION: No acute fractures demonstrated appear  07/03/2013: CHEST  2  VIEW  COMPARISON:  08/09/2012, 07/24/10 IMPRESSION: No acute cardiopulmonary process. Stable thoracolumbar compression deformity. New (when compared to 2012) central thoracic spine vertebral body compression deformity, of otherwise uncertain age.   Cardiovascular Exams:   Laboratory Studies:    11/24/2012 WBC 10.4, hemoglobin 12.0, 30 hematocrit 37.0, platelets 465               Glucose 111, BUN 27, creatinine 1.03, sodium 136, potassium 3.7               TSH 0.651               Vitamin B12 280 12/29/2012 WBC 7.5, hemoglobin 11.8, hematocrit 35.8, platelets 439             Glucose 114, BUN 20, creatinine 1.23, sodium 138, potassium 3.4. LFTs/proteins WNL.             B12 786             Vit D 51 04/26/2013 WBC 8.4, Hgb 13.2, Hct 39.7, Plt 330             Glu 127, BUN 21, Cr. 1.07, NA 140, K+ 3.5             TC 246, TG 114, HDL 80, LDL 143  [facility]  Component Value   WBC 9.8   HGB 13.9   HCT 41.4   PLT 311       GLUCOSE 144*   NA 137   K 3.5*   CL 96   CREATININE 1.13*   BUN 30*   CO2 25    Past Medical History  Diagnosis Date  . DVT (deep venous thrombosis) 2011  . Pulmonary embolism Jan 2012  . HTN (hypertension)   . Spinal stenosis   . Bradycardia, on admit 08/09/2012  . Acute MI 08/10/2012    2D Echo - EF 55-60%, normal  . Pacemaker   . Depression   . GERD (gastroesophageal reflux disease)   . Cancer     hx of breast cancer  . Arthritis   . Closed pelvic fracture 11/11/2012  . Alcohol dependence 11/15/2012  . Long term (current) use of anticoagulants 11/15/2012  . Sacral fracture, closed 11/15/2012    Bilateral nondisplaced sacral fractures.   . L5 vertebral fracture 11/15/2012    Fracture of the L5 left transverse process.    . Anemia 11/15/2012  . B12 deficiency 12/01/2012  . Insomnia 12/01/2012  .  Unspecified vitamin D deficiency 12/07/2012  . Anxiety   . Osteoarthritis 02/10/2013  . Lumbar vertebral fracture 06/15/2013    L3-4  12/15/20143  . Anginal pain   . Hyperlipemia    Past Surgical History  Procedure Laterality Date  . Abdominal hysterectomy    . Elbow surgery    . Cataract extraction Bilateral   . Cardiac catheterization  08/08/2012    2.5x84mm Emerge balloon predilation, 2.75x81mm VeriFLEX bare-metal stent was inserted into the RCA to cover proximal to mid segments of RCA deployed with Noncompliant Sprinter balloon 2.75x49mm up to 2.67mm dilated  . Orif wrist fracture Right 07/03/2013    Procedure: OPEN REDUCTION INTERNAL FIXATION (ORIF) RIGHT WRIST FRACTURE;  Surgeon: Linna Hoff, MD;  Location: [place];  Service: Orthopedics;  Laterality: Right;   Family Status  Relation Status Death Age  . Mother Deceased   . Father Deceased   . Maternal Grandmother Deceased   . Maternal Grandfather Deceased   . Paternal Grandmother Deceased   . Paternal Grandfather Deceased   . Daughter Alive   . Son Alive    History   Social History Narrative   Widowed since 2007. Resides at [employer], Independent [place] section since 2006. She was a Occupational hygienist.    No smoking history   Daily alcohol intake (2+oz)     REVIEW OF SYSTEMS DATA OBTAINED: from patient, nurse, medical record,  GENERAL: Feels "OK"   No fevers. Fatigued, decreased appetite   SKIN: No itch, rash or open wounds EYES: No eye pain, dryness or itching  No change in vision EARS: No earache, tinnitus, change in hearing NOSE: No congestion, drainage or bleeding MOUTH/THROAT: No mouth or tooth pain   No sore throat    No difficulty chewing or swallowing RESPIRATORY: No cough, wheezing, SOB CARDIAC: No chest pain, palpitations  No edema. GI: No abdominal pain  No N/V/D or constipation  No heartburn or reflux  GU: No dysuria, frequency or urgency  No change in urine volume or character MUSCULOSKELETAL: Minimal right wrist pain, expresses concern regarding swelling of her right hand   NEUROLOGIC: No dizziness, fainting, headache,   No change in mental status.  PSYCHIATRIC: Mild anxiety, depression Sleeps well.    PHYSICAL EXAM   Filed Vitals:   07/05/13 1241  BP: 134/65  Pulse: 58  Temp: 98.2 F (36.8 C)  Resp: 18  SpO2: 94%   There is no weight on file to calculate BMI.  GENERAL APPEARANCE: No acute distress, appropriately groomed, Frail body habitus. Alert, pleasant, conversant. SKIN: No diaphoresis, rash, unusual lesions, HEAD: Normocephalic, atraumatic EYES: Conjunctiva/lids clear. Pupils round, reactive. Marland Kitchen  EARS: External exam WNL, diminished Hearing, chronic. NOSE: No deformity or discharge. MOUTH/THROAT: Lips w/o lesions. Oral mucosa, tongue moist, w/o lesion. Oropharynx w/o redness or lesions.  NECK: Supple, full ROM. No thyroid tenderness, enlargement or nodule LYMPHATICS: No head, neck or supraclavicular adenopathy RESPIRATORY: Breathing is even, unlabored. Lung sounds are clear and full.  CARDIOVASCULAR: Heart RRR. No murmur or extra heart sounds  ARTERIAL: No carotid bruit. .  VENOUS: No varicosities. No venous stasis skin changes  EDEMA: No peripheral  edema.  GASTROINTESTINAL: Abdomen is soft, non-tender, not distended w/ normal bowel sounds. MUSCULOSKELETAL: Right upper immobilized in plaster splint and sling. Right hand with 2+ edema significant ecchymosis. Fingers are warm and movement minimal discomfort with hand grip NEUROLOGIC: Oriented to time, place, person. Cranial nerves 2-12 grossly intact, speech clear, no tremor.  PSYCHIATRIC: Mood and affect  appropriate to situation  ASSESSMENT/PLAN  Fracture of right distal radius Right wrist fracture 06/28/13, evaluated by Dr.Ortman 07/02/13, now s/p ORIF. Pain is well mananged. Rt. Hand is quite edematous/ecchymotic. Will keep elevated, continue current pain medication  HTN (hypertension) Blood pressure well controlled, continue current medication  Long term (current) use of anticoagulants Patient continues long term anticoagulation with  Xarelto due to H/O PE and DVT. Patient has had multiple falls but risk of recurrent PE/or DVT is elevated since mobility is limited. Continue antocoagulation  Right foot drop Noted just prior to last Rehab discharge. PT will continue to work with patient to improve safety with ambulation. Will require fitting of AFO.   Generalized anxiety disorder Anxiety is manageable with current medications and close assistance   Follow up: AS needed  Jimena Wieczorek T.Eyad Rochford, NP-C 07/05/2013

## 2013-07-05 NOTE — Assessment & Plan Note (Signed)
Patient continues long term anticoagulation with Xarelto due to H/O PE and DVT. Patient has had multiple falls but risk of recurrent PE/or DVT is elevated since mobility is limited. Continue antocoagulation

## 2013-07-05 NOTE — Progress Notes (Signed)
Late entry-OT evaluation addendum   07/04/13 1316  OT Time Calculation  OT Start Time 0928  OT Stop Time 0958  OT Time Calculation (min) 30 min  OT G-codes **NOT FOR INPATIENT CLASS**  Functional Assessment Tool Used clinical judgment  Functional Limitation Self care  Self Care Current Status [phone]) CK  Self Care Goal Status (Q9476) CK  Self Care Discharge Status (L4650) CK  OT General Charges  $OT Visit 1 Procedure  OT Evaluation  $Initial OT Evaluation Tier I 1 Procedure  OT Treatments  $Self Care/Home Management  8-22 mins     Roseanne Reno, OTR/L [phone]

## 2013-07-05 NOTE — Assessment & Plan Note (Signed)
Right wrist fracture 06/28/13, evaluated by Dr.Ortman 07/02/13, now s/p ORIF. Pain is well mananged. Rt. Hand is quite edematous/ecchymotic. Will keep elevated, continue current pain medication

## 2013-07-07 ENCOUNTER — Encounter (HOSPITAL_COMMUNITY): Payer: Self-pay | Admitting: Orthopedic Surgery

## 2013-07-26 ENCOUNTER — Encounter: Payer: Self-pay | Admitting: Geriatric Medicine

## 2013-07-26 ENCOUNTER — Non-Acute Institutional Stay (SKILLED_NURSING_FACILITY): Payer: Medicare Other | Admitting: Geriatric Medicine

## 2013-07-26 DIAGNOSIS — M21371 Foot drop, right foot: Secondary | ICD-10-CM

## 2013-07-26 DIAGNOSIS — F329 Major depressive disorder, single episode, unspecified: Secondary | ICD-10-CM

## 2013-07-26 DIAGNOSIS — F32A Depression, unspecified: Secondary | ICD-10-CM

## 2013-07-26 DIAGNOSIS — M216X9 Other acquired deformities of unspecified foot: Secondary | ICD-10-CM

## 2013-07-26 DIAGNOSIS — S52501A Unspecified fracture of the lower end of right radius, initial encounter for closed fracture: Secondary | ICD-10-CM

## 2013-07-26 DIAGNOSIS — S52599A Other fractures of lower end of unspecified radius, initial encounter for closed fracture: Secondary | ICD-10-CM

## 2013-07-26 DIAGNOSIS — I10 Essential (primary) hypertension: Secondary | ICD-10-CM

## 2013-07-26 DIAGNOSIS — F3289 Other specified depressive episodes: Secondary | ICD-10-CM

## 2013-07-26 NOTE — Progress Notes (Signed)
Patient ID: Cathy Wallace, female   DOB: 1917/02/10, 80 y.o.   MRN: [id number]  [facility] (31)  Code Status: DNR, MOST form      Contact Information   Name Relation Home Work Mobile   [place] Other [phone]    Nadel,Beverly Daughter [phone]   Ask,Sherry Other [phone]        Chief Complaint  Patient presents with  . Rt. wrist fracture  . Rt. foot drop    HPI: This is a 80 y.o. female resident of [name],  Independent Living  Section who sustained a right wrist fracture with a fall at her home on 06/28/2013. She was evaluated by Dr. Orlan Leavens on January 2, he recommended ORIF of the wrist fracture. This procedure was completed on January 3, surgical procedure and immediate postop period were uneventful. Patient was discharged from the hospital return to the rehabilitation section at [facility] on January 4.  Last visit: Fracture of right distal radius Right wrist fracture 06/28/13, evaluated by Dr.Ortman 07/02/13, now s/p ORIF. Pain is well mananged. Rt. Hand is quite edematous/ecchymotic. Will keep elevated, continue current pain medication  HTN (hypertension) Blood pressure well controlled, continue current medication  Long term (current) use of anticoagulants Patient continues long term anticoagulation with Xarelto due to H/O PE and DVT. Patient has had multiple falls but risk of recurrent PE/or DVT is elevated since mobility is limited. Continue antocoagulation  Right foot drop Noted just prior to last Rehab discharge. PT will continue to work with patient to improve safety with ambulation. Will require fitting of AFO.   Generalized anxiety disorder Anxiety is manageable with current medications and close assistance  Since last visit, patient returned to Dr.Ortman. She continues to be nonweightbearing on her wrist however is able to bear weight on her elbow and  so is now able to ambulate with a platform walker. She continues to have difficulty ambulating safely due to right foot drop. She was measured for AFO last week. This brace is to be delivered in the next week. Patient is working with physical therapy to maintain general strength and ambulation. She reports that she is quite bored and depressed because she can't move around independently.  Review of facility record shows blood pressure is stable, mild bradycardia, weight is stable. Patient is utilizing little oxycodone; usually takes just one dose at bedtime.   Allergies  Allergen Reactions  . Aspirin Other (See Comments)    unknown  . Benadryl [Diphenhydramine Hcl] Other (See Comments)    unknown  . Codeine Nausea And Vomiting  . Flu Virus Vaccine Other (See Comments)    unknown  . Pneumococcal Vaccines Other (See Comments)    unknown  . Penicillins Rash      Medication List       This list is accurate as of: 07/26/13 12:54 PM.  Always use your most recent med list.               acetaminophen 325 MG tablet  Commonly known as:  TYLENOL  Take 650 mg by mouth every 6 (six) hours as needed for mild pain or moderate pain.     diltiazem 180 MG 24 hr capsule  Commonly known as:  CARDIZEM CD  Take 180 mg by mouth daily.     loratadine 10 MG tablet  Commonly known as:  CLARITIN  Take 10 mg by mouth daily as needed for allergies.     LORazepam  0.5 MG tablet  Commonly known as:  ATIVAN  Take 0.5 mg by mouth every 6 (six) hours as needed for anxiety.     LORazepam 2 MG tablet  Commonly known as:  ATIVAN  Take 2 mg by mouth at bedtime.     LORazepam 1 MG tablet  Commonly known as:  ATIVAN  Take 1 mg by mouth daily.     Melatonin 3 MG Tabs  Take 1 tablet by mouth at bedtime.     metoprolol tartrate 25 MG tablet  Commonly known as:  LOPRESSOR  Take 12.5 mg by mouth 2 (two) times daily.     nitroGLYCERIN 0.4 MG SL tablet  Commonly known as:  NITROSTAT  Place 1 tablet (0.4  mg total) under the tongue every 5 (five) minutes x 3 doses as needed for chest pain.     oxyCODONE 5 MG immediate release tablet  Commonly known as:  ROXICODONE  Take 1 tablet (5 mg total) by mouth every 4 (four) hours as needed for severe pain.     pantoprazole 40 MG tablet  Commonly known as:  PROTONIX  Take 1 tablet (40 mg total) by mouth daily at 12 noon.     polyethylene glycol packet  Commonly known as:  MIRALAX / GLYCOLAX  Take 17 g by mouth daily as needed for mild constipation.     sertraline 100 MG tablet  Commonly known as:  ZOLOFT  Take 1 tablet (100 mg total) by mouth daily.     triamterene-hydrochlorothiazide 37.5-25 MG per capsule  Commonly known as:  DYAZIDE  Take 1 capsule by mouth daily.     XARELTO 20 MG Tabs tablet  Generic drug:  Rivaroxaban  Take 20 mg by mouth daily.         DATA REVIEWED  Radiologic Exams 06/28/2013  CT HEAD WITHOUT CONTRAST  COMPARISON:  06/14/2013 IMPRESSION: No acute intracranial abnormalities. Stable appearance of chronic atrophy and small vessel ischemic changes.     RIGHT WRIST - COMPLETE 3+ VIEW  COMPARISON:  None. IMPRESSION: Displaced fractures of the distal right radius and ulnar metaphysis with additional fracture of the ulnar styloid process.   RIGHT ANKLE - COMPLETE 3+ VIEW COMPARISON:  None.  IMPRESSION: No acute fractures demonstrated appear  07/03/2013: CHEST  2 VIEW  COMPARISON:  08/09/2012, 07/24/10 IMPRESSION: No acute cardiopulmonary process. Stable thoracolumbar compression deformity. New (when compared to 2012) central thoracic spine vertebral body compression deformity, of otherwise uncertain age.   Cardiovascular Exams:   Laboratory Studies:    11/24/2012 WBC 10.4, hemoglobin 12.0, 30 hematocrit 37.0, platelets 465               Glucose 111, BUN 27, creatinine 1.03, sodium 136, potassium 3.7               TSH 0.651               Vitamin B12 280 12/29/2012 WBC 7.5, hemoglobin 11.8, hematocrit  35.8, platelets 439             Glucose 114, BUN 20, creatinine 1.23, sodium 138, potassium 3.4. LFTs/proteins WNL.             B12 786             Vit D 51 04/26/2013 WBC 8.4, Hgb 13.2, Hct 39.7, Plt 330             Glu 127, BUN 21, Cr. 1.07, NA 140, K+ 3.5  TC 246, TG 114, HDL 80, LDL 143  [facility]  Component Value   WBC 9.8   HGB 13.9   HCT 41.4   PLT 311       GLUCOSE 144*   NA 137   K 3.5*   CL 96   CREATININE 1.13*   BUN 30*   CO2 25    REVIEW OF SYSTEMS DATA OBTAINED: from patient, nurse, medical record,  GENERAL: Feels "OK"   No fevers. Appetite a little better  SKIN: No itch, rash or open wounds EYES: No eye pain, dryness or itching  No change in vision EARS: No earache, tinnitus, change in hearing NOSE: No congestion, drainage or bleeding MOUTH/THROAT: No mouth or tooth pain   No sore throat    No difficulty chewing or swallowing RESPIRATORY: No cough, wheezing, SOB CARDIAC: No chest pain, palpitations  No edema. GI: No abdominal pain  No N/V/D or constipation  No heartburn or reflux  GU: No dysuria, frequency or urgency  No change in urine volume or character  MUSCULOSKELETAL: Minimal right wrist pain,  NEUROLOGIC: No dizziness, fainting, headache,  No change in mental status.  PSYCHIATRIC: Mild anxiety, depression Sleeps well.    PHYSICAL EXAM   Filed Vitals:   07/26/13 1253  BP: 134/65  Pulse: 55  Weight: 106 lb 14.4 oz (48.49 kg)   Body mass index is 18.94 kg/(m^2).  GENERAL APPEARANCE: No acute distress, appropriately groomed, Frail body habitus. Alert, pleasant, conversant. SKIN: No diaphoresis, rash, unusual lesions, HEAD: Normocephalic, atraumatic EYES: Conjunctiva/lids clear. Pupils round, reactive. Marland Kitchen  EARS: External exam WNL, diminished Hearing, chronic. NOSE: No deformity or discharge. MOUTH/THROAT: Lips w/o lesions. Oral mucosa, tongue moist, w/o lesion. Oropharynx w/o redness or lesions.  NECK: Supple,  full ROM. No thyroid tenderness, enlargement or nodule LYMPHATICS: No head, neck or supraclavicular adenopathy RESPIRATORY: Breathing is even, unlabored. Lung sounds are clear and full.  CARDIOVASCULAR: Heart RRR. No murmur or extra heart sounds  ARTERIAL: No carotid bruit. .  VENOUS: No varicosities. No venous stasis skin changes  EDEMA: No peripheral  edema.  GASTROINTESTINAL: Abdomen is soft, non-tender, not distended w/ normal bowel sounds. MUSCULOSKELETAL: Right hand and forearm immobilized in cast. No edema of the right hand . Fingers are warm with good range of motion.   Bilateral lower extremity range of motion is good except right ankle. Patient is unable to lift toes off the floor (flex ankle) NEUROLOGIC: Oriented to time, place, person. Cranial nerves 2-12 grossly intact, speech clear, no tremor.  PSYCHIATRIC: Mood and affect appropriate to situation  ASSESSMENT/PLAN  HTN (hypertension) Blood pressure is stable, pulse is borderline low. Continue to monitor daily, consider decreasing beta blocker.  Fracture of right distal radius Wrist remains immobilized in cast, management per orthopedics.  Right foot drop Continued into her patient's mobility status. Awaiting right AFO. Continue PT intervention. Patient will remain in rehabilitation section until she is able to mobilize safely  Depression Patient is admittedly depressed and bored, she's not amenable to increasing socialization. She does the knowledge some light at the end of the tunnel in regards to being able to return to her independent living home. Expect this to happen once she is able to mobilize safely   Follow up: As needed  Renessa Wellnitz T.Beatric Fulop, NP-C 07/26/2013

## 2013-07-29 NOTE — Assessment & Plan Note (Signed)
Blood pressure is stable, pulse is borderline low. Continue to monitor daily, consider decreasing beta blocker.

## 2013-07-29 NOTE — Assessment & Plan Note (Signed)
Patient is admittedly depressed and bored, she's not amenable to increasing socialization. She does the knowledge some light at the end of the tunnel in regards to being able to return to her independent living home. Expect this to happen once she is able to mobilize safely

## 2013-07-29 NOTE — Assessment & Plan Note (Signed)
Wrist remains immobilized in cast, management per orthopedics.

## 2013-07-29 NOTE — Assessment & Plan Note (Addendum)
Continued into her patient's mobility status. Awaiting right AFO. Continue PT intervention. Patient will remain in rehabilitation section until she is able to mobilize safely

## 2013-08-13 ENCOUNTER — Non-Acute Institutional Stay (SKILLED_NURSING_FACILITY): Payer: Medicare Other | Admitting: Geriatric Medicine

## 2013-08-13 ENCOUNTER — Encounter: Payer: Self-pay | Admitting: Geriatric Medicine

## 2013-08-13 DIAGNOSIS — M216X9 Other acquired deformities of unspecified foot: Secondary | ICD-10-CM

## 2013-08-13 DIAGNOSIS — I10 Essential (primary) hypertension: Secondary | ICD-10-CM

## 2013-08-13 DIAGNOSIS — M21371 Foot drop, right foot: Secondary | ICD-10-CM

## 2013-08-13 DIAGNOSIS — S52501A Unspecified fracture of the lower end of right radius, initial encounter for closed fracture: Secondary | ICD-10-CM

## 2013-08-13 DIAGNOSIS — S52599A Other fractures of lower end of unspecified radius, initial encounter for closed fracture: Secondary | ICD-10-CM

## 2013-08-13 NOTE — Assessment & Plan Note (Signed)
Stable blood pressure readings, mild bradycardia, asymptomatic. No medication change

## 2013-08-13 NOTE — Assessment & Plan Note (Addendum)
Fracture continues to heal per Dr. Lequita Asal report. Patient is to remain nonweightbearing status until she returns for her next orthopedic appointment 08/27/2013. When she is cleared for full weight-bearing on the right wrist, patient will require some additional time in the rehabilitation section to relearn regular safe performance of ADLs with right arm.

## 2013-08-13 NOTE — Assessment & Plan Note (Signed)
The patient is ambulating safer with use of AFO. Prior to discharge to independent living home she will require education regarding donning and doffing this appliance

## 2013-08-13 NOTE — Progress Notes (Signed)
Patient ID: Cathy Wallace, female   DOB: Jun 06, 1917, 80 y.o.   MRN: [id number]  [facility] (31)  Code Status: DNR, MOST form      Contact Information   Name Relation Home Work [place] Other [phone]    Nadel,Beverly Daughter [phone]   [place] Other [phone]        Chief Complaint  Patient presents with  . Wrist Injury  . Foot drop    HPI: This is a 80 y.o. female resident of [place],  [place]  section who sustained a right wrist fracture with a fall at her home on 06/28/2013. She was evaluated by Dr. Apolonio Schneiders on January 2, he recommended ORIF of the wrist fracture. This procedure was completed on January 3, surgical procedure and immediate postop period were uneventful. Patient was discharged from the hospital return to the rehabilitation section at [facility] on January 4.  Last visit: HTN (hypertension) Blood pressure is stable, pulse is borderline low. Continue to monitor daily, consider decreasing beta blocker.  Fracture of right distal radius Wrist remains immobilized in cast, management per orthopedics.  Right foot drop Continued into her patient's mobility status. Awaiting right AFO. Continue PT intervention. Patient will remain in rehabilitation section until she is able to mobilize safely  Depression Patient is admittedly depressed and bored, she's not amenable to increasing socialization. She does the knowledge some light at the end of the tunnel in regards to being able to return to her independent living home. Expect this to happen once she is able to mobilize safely   Since last visit, patient returned to Dr. Apolonio Schneiders. Wrist cast was removed the patient now is wearing a wrist brace. She continues to be nonweightbearing on the right wrist. This weight bearing status continues to limit her ability to be independent with ADLs and  transfers. Patient is wearing AFO on her right foot, PT reports she is ambulating much better; safer. Review of facility record shows patient's vital signs continued to be stable with borderline bradycardia. Asymptomatic. Patient has gained a few pounds. Patient again reports remarks that she is poor to get here in the rehabilitation section is anxiously awaiting her discharge to her independent living home. PT and OT continue to work with this patient regarding safe ambulation and ADLs.    Allergies  Allergen Reactions  . Aspirin Other (See Comments)    unknown  . Benadryl [Diphenhydramine Hcl] Other (See Comments)    unknown  . Codeine Nausea And Vomiting  . Flu Virus Vaccine Other (See Comments)    unknown  . Pneumococcal Vaccines Other (See Comments)    unknown  . Penicillins Rash    MEDICATIONS - reviewed   DATA REVIEWED  Radiologic Exams 06/28/2013  CT HEAD WITHOUT CONTRAST  COMPARISON:  06/14/2013 IMPRESSION: No acute intracranial abnormalities. Stable appearance of chronic atrophy and small vessel ischemic changes.     RIGHT WRIST - COMPLETE 3+ VIEW  COMPARISON:  None. IMPRESSION: Displaced fractures of the distal right radius and ulnar metaphysis with additional fracture of the ulnar styloid process.   RIGHT ANKLE - COMPLETE 3+ VIEW COMPARISON:  None.  IMPRESSION: No acute fractures demonstrated appear  07/03/2013: CHEST  2 VIEW  COMPARISON:  08/09/2012, 07/24/10 IMPRESSION: No acute cardiopulmonary process. Stable thoracolumbar compression deformity. New (when compared to 2012) central thoracic spine vertebral body compression deformity, of otherwise uncertain age.   Cardiovascular Exams:   Laboratory Studies:  11/24/2012 WBC 10.4, hemoglobin 12.0, 30 hematocrit 37.0, platelets 465               Glucose 111, BUN 27, creatinine 1.03, sodium 136, potassium 3.7               TSH 0.651               Vitamin B12 280 12/29/2012 WBC 7.5, hemoglobin 11.8,  hematocrit 35.8, platelets 439             Glucose 114, BUN 20, creatinine 1.23, sodium 138, potassium 3.4. LFTs/proteins WNL.             B12 786             Vit D 51 04/26/2013 WBC 8.4, Hgb 13.2, Hct 39.7, Plt 330             Glu 127, BUN 21, Cr. 1.07, NA 140, K+ 3.5             TC 246, TG 114, HDL 80, LDL 143  [facility]  Component Value   WBC 9.8   HGB 13.9   HCT 41.4   PLT 311       GLUCOSE 144*   NA 137   K 3.5*   CL 96   CREATININE 1.13*   BUN 30*   CO2 25    REVIEW OF SYSTEMS DATA OBTAINED: from patient, nurse, medical record,  GENERAL: Feels "OK"   No fevers. Appetite a little better, gained a few pounds  SKIN: No itch, rash or open wounds EYES: No eye pain, dryness or itching  No change in vision EARS: No earache, tinnitus, change in hearing NOSE: No congestion, drainage or bleeding MOUTH/THROAT: No mouth or tooth pain   No sore throat    No difficulty chewing or swallowing RESPIRATORY: No cough, wheezing, SOB CARDIAC: No chest pain, palpitations  No edema. GI: No abdominal pain  No N/V/D or constipation  No heartburn or reflux  GU: No dysuria, frequency or urgency  No change in urine volume or character  MUSCULOSKELETAL: Minimal right wrist pain,  NEUROLOGIC: No dizziness, fainting, headache,  No change in mental status.  PSYCHIATRIC: Mild anxiety, depression Sleeps well.    PHYSICAL EXAM   Filed Vitals:   08/13/13 1218  BP: 145/64  Pulse: 55  Weight: 110 lb (49.896 kg)   Body mass index is 19.49 kg/(m^2).  GENERAL APPEARANCE: No acute distress, appropriately groomed, Frail body habitus. Alert, pleasant, conversant. SKIN: No diaphoresis, rash, unusual lesions, HEAD: Normocephalic, atraumatic EYES: Conjunctiva/lids clear. Pupils round, reactive. Marland Kitchen  EARS: External exam WNL, diminished Hearing, chronic. NOSE: No deformity or discharge. MOUTH/THROAT: Lips w/o lesions. Oral mucosa, tongue moist, w/o lesion. Oropharynx w/o redness or  lesions.  NECK: Supple, full ROM. No thyroid tenderness, enlargement or nodule LYMPHATICS: No head, neck or supraclavicular adenopathy RESPIRATORY: Breathing is even, unlabored. Lung sounds are clear and full.  CARDIOVASCULAR: Heart RRR. No murmur or extra heart sounds  ARTERIAL: No carotid bruit. .  VENOUS: No varicosities. No venous stasis skin changes  EDEMA: No peripheral  edema.  GASTROINTESTINAL: Abdomen is soft, non-tender, not distended w/ normal bowel sounds. MUSCULOSKELETAL: Right hand and forearm with wrist brace- removed for exam. Incision is nearly healed, small area of scabbing towards distal end. Limited ROm wrist, no edema of the right hand . Fingers are warm with good range of motion, strong grip.   Rt.foot/lower leg with AFO.  NEUROLOGIC: Oriented  to time, place, person. Cranial nerves 2-12 grossly intact, speech clear, no tremor.  PSYCHIATRIC: Mood and affect appropriate to situation  ASSESSMENT/PLAN  HTN (hypertension) Stable blood pressure readings, mild bradycardia, asymptomatic. No medication change  Fracture of right distal radius Fracture continues to heal per Dr. Lequita Asal report. Patient is to remain nonweightbearing status until she returns for her next orthopedic appointment 08/27/2013. When she is cleared for full weight-bearing on the right wrist, patient will require some additional time in the rehabilitation section to relearn regular safe performance of ADLs with right arm.  Right foot drop The patient is ambulating safer with use of AFO. Prior to discharge to independent living home she will require education regarding donning and doffing this appliance   Follow up: As needed  Mardene Celeste, NP-C [place] [phone]  08/13/2013

## 2013-08-31 ENCOUNTER — Encounter: Payer: Self-pay | Admitting: Geriatric Medicine

## 2013-08-31 ENCOUNTER — Non-Acute Institutional Stay (SKILLED_NURSING_FACILITY): Payer: Medicare Other | Admitting: Geriatric Medicine

## 2013-08-31 DIAGNOSIS — I10 Essential (primary) hypertension: Secondary | ICD-10-CM

## 2013-08-31 DIAGNOSIS — S52599A Other fractures of lower end of unspecified radius, initial encounter for closed fracture: Secondary | ICD-10-CM

## 2013-08-31 DIAGNOSIS — F32A Depression, unspecified: Secondary | ICD-10-CM

## 2013-08-31 DIAGNOSIS — M21371 Foot drop, right foot: Secondary | ICD-10-CM

## 2013-08-31 DIAGNOSIS — F411 Generalized anxiety disorder: Secondary | ICD-10-CM

## 2013-08-31 DIAGNOSIS — F3289 Other specified depressive episodes: Secondary | ICD-10-CM

## 2013-08-31 DIAGNOSIS — S52501A Unspecified fracture of the lower end of right radius, initial encounter for closed fracture: Secondary | ICD-10-CM

## 2013-08-31 DIAGNOSIS — F329 Major depressive disorder, single episode, unspecified: Secondary | ICD-10-CM

## 2013-08-31 DIAGNOSIS — M216X9 Other acquired deformities of unspecified foot: Secondary | ICD-10-CM

## 2013-08-31 DIAGNOSIS — Z7901 Long term (current) use of anticoagulants: Secondary | ICD-10-CM

## 2013-08-31 DIAGNOSIS — R5381 Other malaise: Secondary | ICD-10-CM | POA: Insufficient documentation

## 2013-08-31 NOTE — Assessment & Plan Note (Signed)
Patient is tolerating use of AFO well, she reports she feels much safer walking no issues not dragging her left toes.

## 2013-08-31 NOTE — Assessment & Plan Note (Signed)
Patient's anxiety has been well-controlled during his rehabilitation stay; she's used minimal peer and dosing of anxiolytic. She does admit to some increased anxiety with plan discharge home later this week though reports this is much improved since she has completed arrangements for home assistance. Continue current medications followup in clinic

## 2013-08-31 NOTE — Assessment & Plan Note (Signed)
Most recent evaluation by Dr. Apolonio Schneiders was last week, fracture is healed,  he released her to full weightbearing status with right hand and arm. Patient has demonstrated independent performance of basic ADLs including dressing, is ambulating safely with the rollator walker. She scheduled to return to Dr. Apolonio Schneiders for one more visit 09/24/2013

## 2013-08-31 NOTE — Assessment & Plan Note (Signed)
Blood pressure remained stable on current medications, patient continues with mild, asymptomatic bradycardia. No medication changes at this time

## 2013-08-31 NOTE — Assessment & Plan Note (Signed)
The patient continues anticoagulation with sterile cough or history of DVT and PE. She has had multiple falls in the last several months however she is ambulating safely with use of rolling her walker. Addition of AFO brace on left foot has improved safety of ambulation.

## 2013-08-31 NOTE — Assessment & Plan Note (Signed)
Patient's general mood is much improved with increased independence with daily activities. Continue current medication

## 2013-08-31 NOTE — Progress Notes (Signed)
Patient ID: Cathy Wallace, female   DOB: [birth year], 80 y.o.   MRN: [id number]  [facility] (31)  Code Status: DNR, MOST form  Contact Information   Name Relation Home Work [place] Other [phone]    Nadel,Beverly Daughter [phone]   [place] Other [phone]        Chief Complaint  Patient presents with  . Discharge Note    Rt. wrist fracture, left foot drop    HPI: This is a 80 y.o. female resident of [place],  Independent Living  section who sustained a right wrist fracture with a fall at her home on 06/28/2013. She was evaluated by Dr. Apolonio Schneiders on January 2, he recommended ORIF of the wrist fracture. This procedure was completed on January 3, surgical procedure and immediate postop period were uneventful. Patient was discharged from the hospital return to the rehabilitation section at [facility] on January 4.  Last visit: HTN (hypertension) Stable blood pressure readings, mild bradycardia, asymptomatic. No medication change  Fracture of right distal radius Fracture continues to heal per Dr. Lequita Asal report. Patient is to remain nonweightbearing status until she returns for her next orthopedic appointment 08/27/2013. When she is cleared for full weight-bearing on the right wrist, patient will require some additional time in the rehabilitation section to relearn regular safe performance of ADLs with right arm.  Right foot drop The patient is ambulating safer with use of AFO. Prior to discharge to independent living home she will require education regarding donning and doffing this appliance  Since last visit patient has remained medically stable; vital signs satisfactory, she is eating well, weight is stable, bowel and bladder function within normal limits (no incontinence). Nurse's notes reflect that she is sleeping well throughout the night. Anxiety depression  symptoms have been minimal; she has used p.r.n. lorazepam once in the past several weeks. Patient return to Dr. Apolonio Schneiders last week, he released her to full weightbearing status with her right wrist. Patient tells me her wrist feels pretty good, very little discomfort. Physical therapy has evaluated her further and has released her to ambulate independently with her rolling walker. Patient has been ambulating with the AFO on her left foot for several weeks now, she reports she feels much more steady with this. The patient has arranged for home assistance with care givers daily in the morning and in the evening. She is also arranged for medication management. Occupational therapy has done home safety assessment, all modifications expected to be complete by Friday, March 6.   Allergies  Allergen Reactions  . Aspirin Other (See Comments)    unknown  . Benadryl [Diphenhydramine Hcl] Other (See Comments)    unknown  . Codeine Nausea And Vomiting  . Flu Virus Vaccine Other (See Comments)    unknown  . Pneumococcal Vaccines Other (See Comments)    unknown  . Penicillins Rash      Medication List       This list is accurate as of: 08/31/13  3:17 PM.  Always use your most recent med list.               acetaminophen 325 MG tablet  Commonly known as:  TYLENOL  Take 650 mg by mouth every 6 (six) hours as needed for mild pain or moderate pain.     diltiazem 180 MG 24 hr capsule  Commonly known as:  CARDIZEM CD  Take 180 mg by mouth daily.  loratadine 10 MG tablet  Commonly known as:  CLARITIN  Take 10 mg by mouth daily as needed for allergies.     LORazepam 0.5 MG tablet  Commonly known as:  ATIVAN  Take 0.5 mg by mouth every 6 (six) hours as needed for anxiety.     LORazepam 2 MG tablet  Commonly known as:  ATIVAN  Take 2 mg by mouth at bedtime.     LORazepam 1 MG tablet  Commonly known as:  ATIVAN  Take 1 mg by mouth daily.     Melatonin 3 MG Tabs  Take 1 tablet by mouth at  bedtime.     metoprolol tartrate 25 MG tablet  Commonly known as:  LOPRESSOR  Take 12.5 mg by mouth 2 (two) times daily.     nitroGLYCERIN 0.4 MG SL tablet  Commonly known as:  NITROSTAT  Place 1 tablet (0.4 mg total) under the tongue every 5 (five) minutes x 3 doses as needed for chest pain.     oxyCODONE 5 MG immediate release tablet  Commonly known as:  ROXICODONE  Take 1 tablet (5 mg total) by mouth every 4 (four) hours as needed for severe pain.     pantoprazole 40 MG tablet  Commonly known as:  PROTONIX  Take 1 tablet (40 mg total) by mouth daily at 12 noon.     polyethylene glycol packet  Commonly known as:  MIRALAX / GLYCOLAX  Take 17 g by mouth daily as needed for mild constipation.     sertraline 100 MG tablet  Commonly known as:  ZOLOFT  Take 1 tablet (100 mg total) by mouth daily.     triamterene-hydrochlorothiazide 37.5-25 MG per capsule  Commonly known as:  DYAZIDE  Take 1 capsule by mouth daily.     XARELTO 20 MG Tabs tablet  Generic drug:  Rivaroxaban  Take 20 mg by mouth daily.        DATA REVIEWED  Radiologic Exams 06/28/2013  CT HEAD WITHOUT CONTRAST  COMPARISON:  06/14/2013 IMPRESSION: No acute intracranial abnormalities. Stable appearance of chronic atrophy and small vessel ischemic changes.     RIGHT WRIST - COMPLETE 3+ VIEW  COMPARISON:  None. IMPRESSION: Displaced fractures of the distal right radius and ulnar metaphysis with additional fracture of the ulnar styloid process.   RIGHT ANKLE - COMPLETE 3+ VIEW COMPARISON:  None.  IMPRESSION: No acute fractures demonstrated appear  07/03/2013: CHEST  2 VIEW  COMPARISON:  08/09/2012, 07/24/10 IMPRESSION: No acute cardiopulmonary process. Stable thoracolumbar compression deformity. New (when compared to 2012) central thoracic spine vertebral body compression deformity, of otherwise uncertain age.   Cardiovascular Exams:   Laboratory Studies: 11/24/2012      TSH 0.651                  Vitamin B12 280 12/29/2012        B12 786              Vit D 51 04/26/2013 WBC 8.4, Hgb 13.2, Hct 39.7, Plt 330             Glu 127, BUN 21, Cr. 1.07, NA 140, K+ 3.5             TC 246, TG 114, HDL 80, LDL 143  [facility]  Component Value   WBC 9.8   HGB 13.9   HCT 41.4   PLT 311       GLUCOSE 144*   NA 137  K 3.5*   CL 96   CREATININE 1.13*   BUN 30*   CO2 25    REVIEW OF SYSTEMS DATA OBTAINED: from patient, nurse, medical record,  GENERAL: Feels "very well"   No fevers. Improved appetite and activity, weight is stable  SKIN: No itch, rash or open wounds EYES: No eye pain, dryness or itching  No change in vision EARS: No earache, tinnitus, change in hearing NOSE: No congestion, drainage or bleeding MOUTH/THROAT: No mouth or tooth pain   No sore throat    No difficulty chewing or swallowing RESPIRATORY: No cough, wheezing, SOB CARDIAC: No chest pain, palpitations  No edema. GI: No abdominal pain  No N/V/D or constipation  No heartburn or reflux  GU: No dysuria, frequency or urgency  No change in urine volume or character  MUSCULOSKELETAL: Minimal right wrist pain, ambulating well with a walker NEUROLOGIC: No dizziness, fainting, headache,  No change in mental status.  PSYCHIATRIC: Mild anxiety, depression Sleeps well.    PHYSICAL EXAM   Filed Vitals:   08/31/13 1508  BP: 157/68  Pulse: 62  Weight: 109 lb 9.6 oz (49.714 kg)   Body mass index is 19.42 kg/(m^2).  GENERAL APPEARANCE: No acute distress, appropriately groomed, Frail body habitus. Alert, pleasant, conversant. SKIN: No diaphoresis, rash, unusual lesions, HEAD: Normocephalic, atraumatic EYES: Conjunctiva/lids clear. Pupils round, reactive. Marland Kitchen  EARS: External exam WNL, diminished Hearing, chronic. NOSE: No deformity or discharge. MOUTH/THROAT: Lips w/o lesions. Oral mucosa, tongue moist, w/o lesion. Oropharynx w/o redness or lesions.  RESPIRATORY: Breathing is even, unlabored. Lung  sounds are clear and full.  CARDIOVASCULAR: Heart RRR. No murmur or extra heart sounds  EDEMA: No peripheral  edema.  MUSCULOSKELETAL: Right hand and forearm without wrist brace. Incision is healed. Improved ROM wrist, no edema of the right hand . Fingers are warm with good range of motion, strong grip.   Rt.foot/lower leg with AFO. Transferring from chair to walker as well as ambulation are safe and steady with Rolator. NEUROLOGIC: Oriented to time, place, person. Speech clear, no tremor.  PSYCHIATRIC: Mood and affect appropriate to situation  ASSESSMENT/PLAN - anticipate discharge to independent living home Friday, March 6 with caregivers each day 9am-1pm and 4-8 PM. Home care services will also provide medication management by filling her pillbox weekly.  HTN (hypertension) Blood pressure remained stable on current medications, patient continues with mild, asymptomatic bradycardia. No medication changes at this time  Fracture of right distal radius Most recent evaluation by Dr. Apolonio Schneiders was last week, fracture is healed,  he released her to full weightbearing status with right hand and arm. Patient has demonstrated independent performance of basic ADLs including dressing, is ambulating safely with the rollator walker. She scheduled to return to Dr. Apolonio Schneiders for one more visit 09/24/2013  Depression Patient's general mood is much improved with increased independence with daily activities. Continue current medication  Generalized anxiety disorder Patient's anxiety has been well-controlled during his rehabilitation stay; she's used minimal peer and dosing of anxiolytic. She does admit to some increased anxiety with plan discharge home later this week though reports this is much improved since she has completed arrangements for home assistance. Continue current medications followup in clinic  Long term (current) use of anticoagulants The patient continues anticoagulation with sterile cough or  history of DVT and PE. She has had multiple falls in the last several months however she is ambulating safely with use of rolling her walker. Addition of AFO brace on left foot has  improved safety of ambulation.  Right foot drop Patient is tolerating use of AFO well, she reports she feels much safer walking no issues not dragging her left toes.  Debility Patient has regained independent activity with basic ADLs since healing of her right wrist fracture. She does acknowledge needing assistance/companionship at home on a daily basis and has arranged for caregivers in the morning and in the evenings each day. She's also agreed to medication management to further reduce her anxiety.    Time: 35 minutes, >50% spent counseling/or care coordination  Follow up: Return in about 3 weeks (around 09/21/2013) for Rehab follow up.   Mardene Celeste, NP-C [place] [phone]  08/31/2013

## 2013-08-31 NOTE — Assessment & Plan Note (Signed)
Patient has regained independent activity with basic ADLs since healing of her right wrist fracture. She does acknowledge needing assistance/companionship at home on a daily basis and has arranged for caregivers in the morning and in the evenings each day. She's also agreed to medication management to further reduce her anxiety.

## 2013-09-07 ENCOUNTER — Other Ambulatory Visit: Payer: Self-pay | Admitting: Geriatric Medicine

## 2013-09-07 MED ORDER — ACETAMINOPHEN 325 MG PO TABS: 650.0000 mg | ORAL_TABLET | Freq: Three times a day (TID) | ORAL | Status: DC

## 2013-09-08 ENCOUNTER — Encounter: Payer: Self-pay | Admitting: Geriatric Medicine

## 2013-09-22 ENCOUNTER — Encounter: Payer: Medicare Other | Admitting: Geriatric Medicine

## 2013-09-27 ENCOUNTER — Other Ambulatory Visit: Payer: Self-pay | Admitting: Internal Medicine

## 2013-09-27 ENCOUNTER — Other Ambulatory Visit: Payer: Self-pay | Admitting: Geriatric Medicine

## 2013-09-29 ENCOUNTER — Encounter: Payer: Self-pay | Admitting: Geriatric Medicine

## 2013-09-29 ENCOUNTER — Non-Acute Institutional Stay: Payer: Medicare Other | Admitting: Geriatric Medicine

## 2013-09-29 VITALS — BP 134/62 | HR 60 | Wt 111.0 lb

## 2013-09-29 DIAGNOSIS — S52599A Other fractures of lower end of unspecified radius, initial encounter for closed fracture: Secondary | ICD-10-CM

## 2013-09-29 DIAGNOSIS — F329 Major depressive disorder, single episode, unspecified: Secondary | ICD-10-CM

## 2013-09-29 DIAGNOSIS — F411 Generalized anxiety disorder: Secondary | ICD-10-CM

## 2013-09-29 DIAGNOSIS — I10 Essential (primary) hypertension: Secondary | ICD-10-CM

## 2013-09-29 DIAGNOSIS — S52501A Unspecified fracture of the lower end of right radius, initial encounter for closed fracture: Secondary | ICD-10-CM

## 2013-09-29 DIAGNOSIS — F32A Depression, unspecified: Secondary | ICD-10-CM

## 2013-09-29 DIAGNOSIS — Z0000 Encounter for general adult medical examination without abnormal findings: Secondary | ICD-10-CM | POA: Insufficient documentation

## 2013-09-29 DIAGNOSIS — F3289 Other specified depressive episodes: Secondary | ICD-10-CM

## 2013-09-29 NOTE — Assessment & Plan Note (Signed)
Patient is not having any pain after healing her right wrist fracture, DC p.r.n. Oxycodone. She does complain of some limited functional status, continues with therapy.

## 2013-09-29 NOTE — Assessment & Plan Note (Signed)
Patient reports she has very occasional feelings of increased anxiety during the day, feels lorazepam is very useful. Has not been taking any p.r.n. dosing of lorazepam as prescription has run out. Continues with 1 mg in the morning 2 mg at bedtime, dose >3mg / day is not recommended in geriatric patients. Suggested splitting the 1 mg dose to .5 mg each morning and 0.5 mg if needed in the afternoon, patient declined this suggestion.

## 2013-09-29 NOTE — Assessment & Plan Note (Signed)
Extensive discussion today regarding depression symptoms and normal aging changes. She is not overtly depressed; he is eating well (has actually gained a few pounds), sleeps 7/2 hours a night. He is content to be by herself sometimes, but does acknowledge she enjoys the company of her caregivers and frequent phone calls from her family. She does express she didn't think she would lift this long, and doesn't know why she is here. We discussed some strategies to help her find meaning and purpose in life. Continue current dose of sertraline

## 2013-09-29 NOTE — Progress Notes (Signed)
Patient ID: Cathy Wallace, female   DOB: 1916-12-27, 80 y.o.   MRN: [id number]  [facility] [phone])  Code Status: DNR, MOST form      Contact Information   Name Relation Home Work [place] Other [phone]    Nadel,Beverly Daughter [phone]   [place] Other [phone]        Chief Complaint  Patient presents with  . Medical Managment of Chronic Issues    blood pressure, depression, anxiety  . Rehab    follow-up from discharge from Rehab for right wris frature from a fall    HPI: This is a 80 y.o. female resident of [facility]  section who sustained a right wrist fracture with a fall at her home on 06/28/2013. She was evaluated by Dr. Apolonio Schneiders on January 2, he recommended ORIF of the wrist fracture. This procedure was completed on January 3, surgical procedure and immediate postop period were uneventful. Patient was discharged from the hospital return to the rehabilitation section at [facility] on January 4.  Last visit: anticipate discharge to independent living home Friday, March 6 with caregivers each day 9am-1pm and 4-8 PM. Home care services will also provide medication management by filling her pillbox weekly.  HTN (hypertension) Blood pressure remained stable on current medications, patient continues with mild, asymptomatic bradycardia. No medication changes at this time  Fracture of right distal radius Most recent evaluation by Dr. Apolonio Schneiders was last week, fracture is healed,  he released her to full weightbearing status with right hand and arm. Patient has demonstrated independent performance of basic ADLs including dressing, is ambulating safely with the rollator walker. She scheduled to return to Dr. Apolonio Schneiders for one more visit 09/24/2013  Depression Patient's general mood is much improved with increased independence with daily activities.  Continue current medication  Generalized anxiety disorder Patient's anxiety has been well-controlled during his rehabilitation stay; she's used minimal peer and dosing of anxiolytic. She does admit to some increased anxiety with plan discharge home later this week though reports this is much improved since she has completed arrangements for home assistance. Continue current medications followup in clinic  Long term (current) use of anticoagulants The patient continues anticoagulation with sterile cough or history of DVT and PE. She has had multiple falls in the last several months however she is ambulating safely with use of rolling her walker. Addition of AFO brace on left foot has improved safety of ambulation.  Right foot drop Patient is tolerating use of AFO well, she reports she feels much safer walking no issues not dragging her left toes.  Debility Patient has regained independent activity with basic ADLs since healing of her right wrist fracture. She does acknowledge needing assistance/companionship at home on a daily basis and has arranged for caregivers in the morning and in the evenings each day. She's also agreed to medication management to further reduce her anxiety.   Since last visit, patient return to her independent living home. She has caregivers several hours each morning and evening to assist with basic care, meals and some companionship. She's not had any significant issues, does continue to have intermittent anxiety and depressive feelings. Reports she knows she should call people and schedule things to do but just doesn't want to come [place] to much trouble. Realizes her life has gotten smaller but is not unhappy about that. Does appreciate JVD assistance and companionship of her caregivers, continues to enjoy frequent  phone conversations with [place] family.  The patient is now experiencing any pain related to her healed wrist fracture, does report she continues therapy as  her function with that hand is not quite back to normal. The patient is wearing the right AFO daily, feels more confident walking, continues to also use the walker at all times.    Allergies  Allergen Reactions  . Aspirin Other (See Comments)    unknown  . Benadryl [Diphenhydramine Hcl] Other (See Comments)    unknown  . Codeine Nausea And Vomiting  . Flu Virus Vaccine Other (See Comments)    unknown  . Pneumococcal Vaccines Other (See Comments)    unknown  . Penicillins Rash      Medication List       This list is accurate as of: 09/29/13 12:59 PM.  Always use your most recent med list.               acetaminophen 325 MG tablet  Commonly known as:  TYLENOL  Take 2 tablets (650 mg total) by mouth 3 (three) times daily.     diltiazem 180 MG 24 hr capsule  Commonly known as:  CARDIZEM CD  Take 180 mg by mouth daily.     fluticasone 50 MCG/ACT nasal spray  Commonly known as:  FLONASE  Place 2 sprays into both nostrils daily.     loratadine 10 MG tablet  Commonly known as:  CLARITIN  Take 10 mg by mouth daily as needed for allergies.     LORazepam 2 MG tablet  Commonly known as:  ATIVAN  Take 2 mg by mouth at bedtime.     LORazepam 1 MG tablet  Commonly known as:  ATIVAN  Take 1 mg by mouth daily.     Melatonin 3 MG Tabs  Take 1 tablet by mouth at bedtime.     metoprolol tartrate 25 MG tablet  Commonly known as:  LOPRESSOR  Take 12.5 mg by mouth 2 (two) times daily.     nitroGLYCERIN 0.4 MG SL tablet  Commonly known as:  NITROSTAT  Place 1 tablet (0.4 mg total) under the tongue every 5 (five) minutes x 3 doses as needed for chest pain.     pantoprazole 40 MG tablet  Commonly known as:  PROTONIX  Take 1 tablet (40 mg total) by mouth daily at 12 noon.     polyethylene glycol packet  Commonly known as:  MIRALAX / GLYCOLAX  Take 17 g by mouth daily as needed for mild constipation.     sertraline 100 MG tablet  Commonly known as:  ZOLOFT  Take 1 tablet  (100 mg total) by mouth daily.     triamterene-hydrochlorothiazide 37.5-25 MG per capsule  Commonly known as:  DYAZIDE  Take 1 capsule by mouth daily.     XARELTO 20 MG Tabs tablet  Generic drug:  Rivaroxaban  Take 20 mg by mouth daily.        DATA REVIEWED  Radiologic Exams  Cardiovascular Exams:   Laboratory Studies: 11/24/2012      TSH 0.651                Vitamin B12 280 12/29/2012        B12 786              Vit D 51 04/26/2013 WBC 8.4, Hgb 13.2, Hct 39.7, Plt 330             Glu 127, BUN 21, Cr.  1.07, NA 140, K+ 3.5             TC 246, TG 114, HDL 80, LDL 143  [facility]  Component Value   WBC 9.8   HGB 13.9   HCT 41.4   PLT 311       GLUCOSE 144*   NA 137   K 3.5*   CL 96   CREATININE 1.13*   BUN 30*   CO2 25    REVIEW OF SYSTEMS DATA OBTAINED: from patient, nurse, medical record,  GENERAL: Feels "OK"   No fevers. Improved appetite and activity, weight is "a little better"  SKIN: No itch, rash or open wounds EYES: No eye pain, dryness or itching  No change in vision EARS: No earache, tinnitus, change in hearing NOSE: No congestion, drainage or bleeding MOUTH/THROAT: No mouth or tooth pain   No sore throat    No difficulty chewing or swallowing RESPIRATORY: No cough, wheezing, SOB CARDIAC: No chest pain, palpitations  No edema. GI: No abdominal pain  No N/V/D or constipation  No heartburn or reflux  GU: No dysuria, frequency or urgency  No change in urine volume or character  MUSCULOSKELETAL: No right wrist pain, ambulating well with AFO/walker NEUROLOGIC: No dizziness, fainting, headache,  No change in mental status.  PSYCHIATRIC: Mild anxiety, depression Sleeps well.    PHYSICAL EXAM   Filed Vitals:   09/29/13 0929  BP: 134/62  Pulse: 60  Weight: 111 lb (50.349 kg)   Body mass index is 19.67 kg/(m^2).  GENERAL APPEARANCE: No acute distress, appropriately groomed, Frail body habitus. Alert, pleasant, conversant. SKIN:  No diaphoresis, rash, unusual lesions, HEAD: Normocephalic, atraumatic EYES: Conjunctiva/lids clear. Pupils round, reactive. Marland Kitchen  EARS: External exam WNL, diminished Hearing, chronic. NOSE: No deformity or discharge. MOUTH/THROAT: Lips w/o lesions. Oral mucosa, tongue moist, w/o lesion. Oropharynx w/o redness or lesions.  RESPIRATORY: Breathing is even, unlabored. Lung sounds are clear and full.  CARDIOVASCULAR: Heart RRR. No murmur or extra heart sounds  EDEMA: No peripheral  edema.  MUSCULOSKELETAL: FROM wrist, no edema of the right hand . Fingers are warm with good range of motion, strong grip.   Rt.foot/lower leg with AFO. Rises from chair easily walks safely with a walker, right toes do not drag.  NEUROLOGIC: Oriented to time, place, person. Speech clear, no tremor.  PSYCHIATRIC: Mood and affect appropriate to situation  ASSESSMENT/PLAN  HTN (hypertension) Blood pressure remains well-controlled with current medications.  Fracture of right distal radius Patient is not having any pain after healing her right wrist fracture, DC p.r.n. Oxycodone. She does complain of some limited functional status, continues with therapy.  Generalized anxiety disorder Patient reports she has very occasional feelings of increased anxiety during the day, feels lorazepam is very useful. Has not been taking any p.r.n. dosing of lorazepam as prescription has run out. Continues with 1 mg in the morning 2 mg at bedtime, dose >3mg / day is not recommended in geriatric patients. Suggested splitting the 1 mg dose to .5 mg each morning and 0.5 mg if needed in the afternoon, patient declined this suggestion.   Depression Extensive discussion today regarding depression symptoms and normal aging changes. She is not overtly depressed; he is eating well (has actually gained a few pounds), sleeps 7/2 hours a night. He is content to be by herself sometimes, but does acknowledge she enjoys the company of her caregivers and  frequent phone calls from her family. She does express she didn't  think she would lift this long, and doesn't know why she is here. We discussed some strategies to help her find meaning and purpose in life. Continue current dose of sertraline  Healthcare maintenance Patient agreed to undergo genetic testing today. Once results are received, will contact patient if medication adjustments are necessary   Time: 40 minutes, >50% spent counseling/or care coordination  Follow up: Return in about 3 months (around 12/29/2013) for BP, depression, anxiety, Lab.   Mardene Celeste, NP-C [place] [phone]  09/29/2013

## 2013-09-29 NOTE — Assessment & Plan Note (Signed)
Patient agreed to undergo genetic testing today. Once results are received, will contact patient if medication adjustments are necessary

## 2013-09-29 NOTE — Assessment & Plan Note (Signed)
Blood pressure remains well-controlled with current medications.

## 2013-10-18 ENCOUNTER — Other Ambulatory Visit: Payer: Self-pay | Admitting: Geriatric Medicine

## 2013-10-20 ENCOUNTER — Encounter: Payer: Self-pay | Admitting: Geriatric Medicine

## 2013-10-20 ENCOUNTER — Non-Acute Institutional Stay: Payer: Medicare Other | Admitting: Geriatric Medicine

## 2013-10-20 VITALS — BP 140/62 | HR 60 | Wt 113.0 lb

## 2013-10-20 DIAGNOSIS — M25471 Effusion, right ankle: Secondary | ICD-10-CM | POA: Insufficient documentation

## 2013-10-20 DIAGNOSIS — M7989 Other specified soft tissue disorders: Secondary | ICD-10-CM

## 2013-10-20 DIAGNOSIS — M25472 Effusion, left ankle: Principal | ICD-10-CM

## 2013-10-20 NOTE — Progress Notes (Signed)
Patient ID: Cathy Wallace, female   DOB: January 24, 1917, 80 y.o.   MRN: [id number]   [facility] [phone] Information   Name Relation Home Work [place] Other [phone]    [place] Daughter [phone]   [place] Other [phone]        Chief Complaint  Patient presents with  . Edema    both ankles for one week    HPI: This is a 80 y.o. female resident of [place], [place]  section.  Evaluation is requested today due to leg swelling.  Patient reports that both ankles look very good in the morning but by the evening they are very swollen. She notes a swelling on the outside of each ankle at the bone.     Allergies  Allergen Reactions  . Aspirin Other (See Comments)    unknown  . Benadryl [Diphenhydramine Hcl] Other (See Comments)    unknown  . Codeine Nausea And Vomiting  . Flu Virus Vaccine Other (See Comments)    unknown  . Pneumococcal Vaccines Other (See Comments)    unknown  . Penicillins Rash    MEDICATIONS -     Medication List       This list is accurate as of: 10/20/13  9:32 AM.  Always use your most recent med list.               acetaminophen 325 MG tablet  Commonly known as:  TYLENOL  Take 2 tablets (650 mg total) by mouth 3 (three) times daily.     diltiazem 180 MG 24 hr capsule  Commonly known as:  CARDIZEM CD  Take 180 mg by mouth daily.     fluticasone 50 MCG/ACT nasal spray  Commonly known as:  FLONASE  Place 2 sprays into both nostrils daily.     loratadine 10 MG tablet  Commonly known as:  CLARITIN  Take 10 mg by mouth daily as needed for allergies.     LORazepam 2 MG tablet  Commonly known as:  ATIVAN  Take 2 mg by mouth at bedtime.     LORazepam 1 MG tablet  Commonly known as:  ATIVAN  Take 1 mg by mouth daily.     Melatonin 3 MG Tabs  Take 1 tablet by mouth at bedtime.     metoprolol  tartrate 25 MG tablet  Commonly known as:  LOPRESSOR  Take 12.5 mg by mouth 2 (two) times daily.     nitroGLYCERIN 0.4 MG SL tablet  Commonly known as:  NITROSTAT  Place 1 tablet (0.4 mg total) under the tongue every 5 (five) minutes x 3 doses as needed for chest pain.     pantoprazole 40 MG tablet  Commonly known as:  PROTONIX  Take 1 tablet (40 mg total) by mouth daily at 12 noon.     polyethylene glycol packet  Commonly known as:  MIRALAX / GLYCOLAX  Take 17 g by mouth daily as needed for mild constipation.     sertraline 100 MG tablet  Commonly known as:  ZOLOFT  Take 1 tablet (100 mg total) by mouth daily.     triamterene-hydrochlorothiazide 37.5-25 MG per capsule  Commonly known as:  DYAZIDE  Take 1 capsule by mouth daily.     XARELTO 20 MG Tabs tablet  Generic drug:  rivaroxaban  TAKE ONE TABLET DAILY FOR ANTICOAGULANT.         DATA REVIEWED  Radiologic  Exams:   Laboratory Studies: Lab Results  Component Value Date   WBC 9.8 07/03/2013   HGB 13.9 07/03/2013   HCT 41.4 07/03/2013   MCV 90.4 07/03/2013   PLT 311 07/03/2013   Lab Results  Component Value Date   NA 137 07/03/2013   K 3.5* 07/03/2013   BUN 30* 07/03/2013   CREATININE 1.13* 07/03/2013     REVIEW OF SYSTEMS  DATA OBTAINED: from patient GENERAL: Feels "OK, tire easily"  No recent fever, change in activity status, appetite, or weight  RESPIRATORY: No cough, wheezing, SOB CARDIAC: No chest pain, palpitations. No edema GI: No abdominal pain  No Nausea,vomiting,diarrhea or constipation  No heartburn or reflux  MUSCULOSKELETAL: No joint pain or stiffness   No back pain    No muscle ache, pain, weakness    Gait is steady    No recent falls  NEUROLOGIC: No dizziness, fainting, headache, No change in mental status  PSYCHIATRIC: No increased anxiety, depression  Sleeps well       PHYSICAL EXAM Filed Vitals:   10/20/13 0908  BP: 140/62  Pulse: 60  Weight: 113 lb (51.256 kg)   Body mass index is 20.02  kg/(m^2).  GENERAL APPEARANCE: No acute distress, appropriately groomed, normal body habitus Alert, pleasant, conversant. SKIN: No diaphoresis, rash, wound HEAD: Normocephalic, atraumatic EYES: Conjunctiva/lids clear  RESPIRATORY: Breathing is even, unlabored  Lung sounds are clear and full  CARDIOVASCULAR: Heart RRR   No murmur or extra heart sounds   EDEMA: Trace lateral ankle edema at the malleolus on each foot. Patient wears orthotic shoes with Velcro strap, There is an indentation across anterior ankle to just below the lateral malleolus where strap is pulled very tight.  MUSCULOSKELETAL.  Wearing AFO on the right foot and lower leg, Gait is steady with walker, no right toes dragging PSYCHIATRIC: Mood and affect appropriate to situation    ASSESSMENT/PLAN  Swelling of both ankles Swelling of the lateral aspect of both ankles is likely due to tightness in Velcro closure of her shoes. Patient is returning to the orthotist next week for adjustment of her AFO and purchase a new shoes. The shoes likely will need adjustment as well    Family/ staff Communication:     Labs/tests ordered:    Follow up: Return for As scheduled.  Mardene Celeste, NP-C [place] [phone]  10/20/2013

## 2013-10-20 NOTE — Assessment & Plan Note (Signed)
Swelling of the lateral aspect of both ankles is likely due to tightness in Velcro closure of her shoes. Patient is returning to the orthotist next week for adjustment of her AFO and purchase a new shoes. The shoes likely will need adjustment as well

## 2013-10-23 ENCOUNTER — Encounter: Payer: Self-pay | Admitting: *Deleted

## 2013-11-02 ENCOUNTER — Other Ambulatory Visit: Payer: Self-pay | Admitting: *Deleted

## 2013-11-04 ENCOUNTER — Other Ambulatory Visit: Payer: Self-pay | Admitting: Internal Medicine

## 2013-11-05 NOTE — Telephone Encounter (Signed)
Last OV: 10/20/13 with [facility]  Never filled by [place].  Is it okay to refill? Please advise. Thanks

## 2013-11-08 NOTE — Telephone Encounter (Signed)
What medication is being requested

## 2013-11-11 ENCOUNTER — Other Ambulatory Visit: Payer: Self-pay | Admitting: Geriatric Medicine

## 2013-11-11 ENCOUNTER — Encounter: Payer: Self-pay | Admitting: Geriatric Medicine

## 2013-11-11 DIAGNOSIS — N390 Urinary tract infection, site not specified: Secondary | ICD-10-CM

## 2013-11-11 LAB — CBC AND DIFFERENTIAL
HCT: 37 % (ref 36–46)
Hemoglobin: 12.3 g/dL (ref 12.0–16.0)
PLATELETS: 268 10*3/uL (ref 150–399)
WBC: 16.3 10^3/mL

## 2013-11-11 MED ORDER — SULFAMETHOXAZOLE-TMP DS 800-160 MG PO TABS: 1.0000 | ORAL_TABLET | Freq: Two times a day (BID) | ORAL | Status: AC

## 2013-11-11 NOTE — Progress Notes (Unsigned)
Patient called this AM with c/o "pink urine, voiding every 2 hours". Urine sample sent for analysis, result positive for blood,leukocyte esterase, WBC, rare bacteria. CBC with mild drop in Hgb since January, WBC elevated (16.3). Start Bactrim DS x 7days. Hold Xarelto for next 3 days. Follow culture

## 2013-11-16 ENCOUNTER — Inpatient Hospital Stay (HOSPITAL_COMMUNITY): Payer: Medicare Other

## 2013-11-16 ENCOUNTER — Encounter (HOSPITAL_COMMUNITY): Payer: Self-pay | Admitting: Emergency Medicine

## 2013-11-16 ENCOUNTER — Emergency Department (HOSPITAL_COMMUNITY): Payer: Medicare Other

## 2013-11-16 ENCOUNTER — Encounter: Payer: Self-pay | Admitting: Geriatric Medicine

## 2013-11-16 ENCOUNTER — Inpatient Hospital Stay (HOSPITAL_COMMUNITY)
Admission: EM | Admit: 2013-11-16 | Discharge: 2013-11-23 | DRG: 481 | Disposition: A | Discharge status: Skilled Nursing Facility | Payer: Medicare Other | Attending: Internal Medicine | Admitting: Internal Medicine

## 2013-11-16 DIAGNOSIS — S1093XA Contusion of unspecified part of neck, initial encounter: Secondary | ICD-10-CM

## 2013-11-16 DIAGNOSIS — W19XXXA Unspecified fall, initial encounter: Secondary | ICD-10-CM

## 2013-11-16 DIAGNOSIS — M21371 Foot drop, right foot: Secondary | ICD-10-CM | POA: Diagnosis present

## 2013-11-16 DIAGNOSIS — C50919 Malignant neoplasm of unspecified site of unspecified female breast: Secondary | ICD-10-CM

## 2013-11-16 DIAGNOSIS — K5731 Diverticulosis of large intestine without perforation or abscess with bleeding: Secondary | ICD-10-CM

## 2013-11-16 DIAGNOSIS — S0083XA Contusion of other part of head, initial encounter: Secondary | ICD-10-CM | POA: Diagnosis present

## 2013-11-16 DIAGNOSIS — F411 Generalized anxiety disorder: Secondary | ICD-10-CM

## 2013-11-16 DIAGNOSIS — Y9210 Unspecified residential institution as the place of occurrence of the external cause: Secondary | ICD-10-CM | POA: Diagnosis present

## 2013-11-16 DIAGNOSIS — F1020 Alcohol dependence, uncomplicated: Secondary | ICD-10-CM

## 2013-11-16 DIAGNOSIS — E785 Hyperlipidemia, unspecified: Secondary | ICD-10-CM | POA: Diagnosis present

## 2013-11-16 DIAGNOSIS — S329XXA Fracture of unspecified parts of lumbosacral spine and pelvis, initial encounter for closed fracture: Secondary | ICD-10-CM

## 2013-11-16 DIAGNOSIS — I442 Atrioventricular block, complete: Secondary | ICD-10-CM

## 2013-11-16 DIAGNOSIS — E739 Lactose intolerance, unspecified: Secondary | ICD-10-CM

## 2013-11-16 DIAGNOSIS — S72009A Fracture of unspecified part of neck of unspecified femur, initial encounter for closed fracture: Secondary | ICD-10-CM

## 2013-11-16 DIAGNOSIS — S32059A Unspecified fracture of fifth lumbar vertebra, initial encounter for closed fracture: Secondary | ICD-10-CM

## 2013-11-16 DIAGNOSIS — F329 Major depressive disorder, single episode, unspecified: Secondary | ICD-10-CM | POA: Diagnosis present

## 2013-11-16 DIAGNOSIS — Z887 Allergy status to serum and vaccine status: Secondary | ICD-10-CM

## 2013-11-16 DIAGNOSIS — Z950 Presence of cardiac pacemaker: Secondary | ICD-10-CM

## 2013-11-16 DIAGNOSIS — Z66 Do not resuscitate: Secondary | ICD-10-CM | POA: Diagnosis present

## 2013-11-16 DIAGNOSIS — Z9849 Cataract extraction status, unspecified eye: Secondary | ICD-10-CM

## 2013-11-16 DIAGNOSIS — K219 Gastro-esophageal reflux disease without esophagitis: Secondary | ICD-10-CM

## 2013-11-16 DIAGNOSIS — E559 Vitamin D deficiency, unspecified: Secondary | ICD-10-CM

## 2013-11-16 DIAGNOSIS — F3289 Other specified depressive episodes: Secondary | ICD-10-CM | POA: Diagnosis present

## 2013-11-16 DIAGNOSIS — D649 Anemia, unspecified: Secondary | ICD-10-CM

## 2013-11-16 DIAGNOSIS — S52501A Unspecified fracture of the lower end of right radius, initial encounter for closed fracture: Secondary | ICD-10-CM

## 2013-11-16 DIAGNOSIS — Z86711 Personal history of pulmonary embolism: Secondary | ICD-10-CM

## 2013-11-16 DIAGNOSIS — Z853 Personal history of malignant neoplasm of breast: Secondary | ICD-10-CM

## 2013-11-16 DIAGNOSIS — M4800 Spinal stenosis, site unspecified: Secondary | ICD-10-CM | POA: Diagnosis present

## 2013-11-16 DIAGNOSIS — Z888 Allergy status to other drugs, medicaments and biological substances status: Secondary | ICD-10-CM

## 2013-11-16 DIAGNOSIS — S72002A Fracture of unspecified part of neck of left femur, initial encounter for closed fracture: Secondary | ICD-10-CM

## 2013-11-16 DIAGNOSIS — S32009A Unspecified fracture of unspecified lumbar vertebra, initial encounter for closed fracture: Secondary | ICD-10-CM

## 2013-11-16 DIAGNOSIS — M25472 Effusion, left ankle: Secondary | ICD-10-CM

## 2013-11-16 DIAGNOSIS — Z9861 Coronary angioplasty status: Secondary | ICD-10-CM

## 2013-11-16 DIAGNOSIS — N390 Urinary tract infection, site not specified: Secondary | ICD-10-CM

## 2013-11-16 DIAGNOSIS — I82409 Acute embolism and thrombosis of unspecified deep veins of unspecified lower extremity: Secondary | ICD-10-CM

## 2013-11-16 DIAGNOSIS — S0003XA Contusion of scalp, initial encounter: Secondary | ICD-10-CM | POA: Diagnosis present

## 2013-11-16 DIAGNOSIS — M1990 Unspecified osteoarthritis, unspecified site: Secondary | ICD-10-CM

## 2013-11-16 DIAGNOSIS — I213 ST elevation (STEMI) myocardial infarction of unspecified site: Secondary | ICD-10-CM

## 2013-11-16 DIAGNOSIS — Z880 Allergy status to penicillin: Secondary | ICD-10-CM

## 2013-11-16 DIAGNOSIS — K5900 Constipation, unspecified: Secondary | ICD-10-CM | POA: Diagnosis not present

## 2013-11-16 DIAGNOSIS — M25471 Effusion, right ankle: Secondary | ICD-10-CM

## 2013-11-16 DIAGNOSIS — M216X9 Other acquired deformities of unspecified foot: Secondary | ICD-10-CM | POA: Diagnosis present

## 2013-11-16 DIAGNOSIS — Z0000 Encounter for general adult medical examination without abnormal findings: Secondary | ICD-10-CM

## 2013-11-16 DIAGNOSIS — Z86718 Personal history of other venous thrombosis and embolism: Secondary | ICD-10-CM

## 2013-11-16 DIAGNOSIS — M810 Age-related osteoporosis without current pathological fracture: Secondary | ICD-10-CM

## 2013-11-16 DIAGNOSIS — Z7901 Long term (current) use of anticoagulants: Secondary | ICD-10-CM

## 2013-11-16 DIAGNOSIS — Z886 Allergy status to analgesic agent status: Secondary | ICD-10-CM

## 2013-11-16 DIAGNOSIS — S72143A Displaced intertrochanteric fracture of unspecified femur, initial encounter for closed fracture: Principal | ICD-10-CM | POA: Diagnosis present

## 2013-11-16 DIAGNOSIS — Z9181 History of falling: Secondary | ICD-10-CM

## 2013-11-16 DIAGNOSIS — F32A Depression, unspecified: Secondary | ICD-10-CM

## 2013-11-16 DIAGNOSIS — D62 Acute posthemorrhagic anemia: Secondary | ICD-10-CM | POA: Diagnosis not present

## 2013-11-16 DIAGNOSIS — I252 Old myocardial infarction: Secondary | ICD-10-CM

## 2013-11-16 DIAGNOSIS — I2699 Other pulmonary embolism without acute cor pulmonale: Secondary | ICD-10-CM | POA: Diagnosis present

## 2013-11-16 DIAGNOSIS — W010XXA Fall on same level from slipping, tripping and stumbling without subsequent striking against object, initial encounter: Secondary | ICD-10-CM | POA: Diagnosis present

## 2013-11-16 DIAGNOSIS — R5381 Other malaise: Secondary | ICD-10-CM

## 2013-11-16 DIAGNOSIS — S3210XA Unspecified fracture of sacrum, initial encounter for closed fracture: Secondary | ICD-10-CM

## 2013-11-16 DIAGNOSIS — I10 Essential (primary) hypertension: Secondary | ICD-10-CM

## 2013-11-16 DIAGNOSIS — I2510 Atherosclerotic heart disease of native coronary artery without angina pectoris: Secondary | ICD-10-CM

## 2013-11-16 DIAGNOSIS — E538 Deficiency of other specified B group vitamins: Secondary | ICD-10-CM

## 2013-11-16 DIAGNOSIS — Z79899 Other long term (current) drug therapy: Secondary | ICD-10-CM

## 2013-11-16 DIAGNOSIS — N179 Acute kidney failure, unspecified: Secondary | ICD-10-CM | POA: Diagnosis not present

## 2013-11-16 DIAGNOSIS — G4700 Insomnia, unspecified: Secondary | ICD-10-CM

## 2013-11-16 LAB — CBC WITH DIFFERENTIAL/PLATELET
Basophils Absolute: 0 10*3/uL (ref 0.0–0.1)
Basophils Relative: 0 % (ref 0–1)
EOS ABS: 0.1 10*3/uL (ref 0.0–0.7)
Eosinophils Relative: 1 % (ref 0–5)
HCT: 33.2 % — ABNORMAL LOW (ref 36.0–46.0)
HEMOGLOBIN: 10.9 g/dL — AB (ref 12.0–15.0)
LYMPHS ABS: 1.3 10*3/uL (ref 0.7–4.0)
Lymphocytes Relative: 10 % — ABNORMAL LOW (ref 12–46)
MCH: 29.5 pg (ref 26.0–34.0)
MCHC: 32.8 g/dL (ref 30.0–36.0)
MCV: 90 fL (ref 78.0–100.0)
MONOS PCT: 7 % (ref 3–12)
Monocytes Absolute: 0.9 10*3/uL (ref 0.1–1.0)
Neutro Abs: 10.3 10*3/uL — ABNORMAL HIGH (ref 1.7–7.7)
Neutrophils Relative %: 82 % — ABNORMAL HIGH (ref 43–77)
Platelets: 241 10*3/uL (ref 150–400)
RBC: 3.69 MIL/uL — AB (ref 3.87–5.11)
RDW: 14.3 % (ref 11.5–15.5)
WBC: 12.5 10*3/uL — ABNORMAL HIGH (ref 4.0–10.5)

## 2013-11-16 LAB — URINALYSIS, ROUTINE W REFLEX MICROSCOPIC
Bilirubin Urine: NEGATIVE
Glucose, UA: NEGATIVE mg/dL
Hgb urine dipstick: NEGATIVE
Ketones, ur: NEGATIVE mg/dL
NITRITE: NEGATIVE
PH: 5.5 (ref 5.0–8.0)
Protein, ur: NEGATIVE mg/dL
Specific Gravity, Urine: 1.017 (ref 1.005–1.030)
Urobilinogen, UA: 0.2 mg/dL (ref 0.0–1.0)

## 2013-11-16 LAB — BASIC METABOLIC PANEL
BUN: 20 mg/dL (ref 6–23)
CHLORIDE: 100 meq/L (ref 96–112)
CO2: 22 mEq/L (ref 19–32)
Calcium: 8.9 mg/dL (ref 8.4–10.5)
Creatinine, Ser: 1.33 mg/dL — ABNORMAL HIGH (ref 0.50–1.10)
GFR calc Af Amer: 38 mL/min — ABNORMAL LOW (ref >90)
GFR calc non Af Amer: 33 mL/min — ABNORMAL LOW (ref >90)
GLUCOSE: 136 mg/dL — AB (ref 70–99)
POTASSIUM: 3.9 meq/L (ref 3.7–5.3)
Sodium: 138 mEq/L (ref 137–147)

## 2013-11-16 LAB — URINE MICROSCOPIC-ADD ON

## 2013-11-16 LAB — PROTIME-INR
INR: 1.83 — AB (ref 0.00–1.49)
Prothrombin Time: 20.6 seconds — ABNORMAL HIGH (ref 11.6–15.2)

## 2013-11-16 MED ORDER — MORPHINE SULFATE 4 MG/ML IJ SOLN
4.0000 mg | Freq: Once | INTRAMUSCULAR | Status: AC
  Administered 2013-11-16: 4 mg via INTRAVENOUS
  Filled 2013-11-16: qty 1

## 2013-11-16 MED ORDER — ONDANSETRON HCL 4 MG/2ML IJ SOLN
4.0000 mg | Freq: Once | INTRAMUSCULAR | Status: AC
  Administered 2013-11-16: 4 mg via INTRAVENOUS
  Filled 2013-11-16: qty 2

## 2013-11-16 MED ORDER — FENTANYL CITRATE 0.05 MG/ML IJ SOLN
50.0000 ug | Freq: Once | INTRAMUSCULAR | Status: AC
  Administered 2013-11-16: 50 ug via INTRAVENOUS
  Filled 2013-11-16: qty 2

## 2013-11-16 NOTE — ED Provider Notes (Signed)
CSN: [id number]     Arrival date & time 11/16/13  1714 History   First MD Initiated Contact with Patient 11/16/13 [place]     Chief Complaint  Patient presents with  . Fall  . Hip Pain     (Consider location/radiation/quality/duration/timing/severity/associated sxs/prior Treatment) Patient is a 80 y.o. female presenting with fall and hip pain. The history is provided by the patient.  Fall This is a new problem. The current episode started 3 to 5 hours ago. Episode frequency: once. The problem has been resolved. Pertinent negatives include no chest pain, no abdominal pain, no headaches and no shortness of breath. Nothing aggravates the symptoms. Nothing relieves the symptoms. She has tried nothing for the symptoms. The treatment provided no relief.  Hip Pain Pertinent negatives include no chest pain, no abdominal pain, no headaches and no shortness of breath.    Past Medical History  Diagnosis Date  . DVT (deep venous thrombosis) 2011  . Pulmonary embolism Jan 2012  . HTN (hypertension)   . Spinal stenosis   . Bradycardia, on admit 08/09/2012  . Acute MI 08/10/2012    2D Echo - EF 55-60%, normal  . Pacemaker   . Depression   . GERD (gastroesophageal reflux disease)   . Cancer     hx of breast cancer  . Arthritis   . Closed pelvic fracture 11/11/2012  . Alcohol dependence 11/15/2012  . Long term (current) use of anticoagulants 11/15/2012  . Sacral fracture, closed 11/15/2012    Bilateral nondisplaced sacral fractures.   . L5 vertebral fracture 11/15/2012    Fracture of the L5 left transverse process.    . Anemia 11/15/2012  . B12 deficiency 12/01/2012  . Insomnia 12/01/2012  . Unspecified vitamin D deficiency 12/07/2012  . Anxiety   . Osteoarthritis 02/10/2013  . Lumbar vertebral fracture 06/15/2013    L3-4 12/15/20143  . Anginal pain   . Hyperlipemia    Past Surgical History  Procedure Laterality Date  . Abdominal hysterectomy    . Elbow surgery    . Cataract extraction Bilateral    . Cardiac catheterization  08/08/2012    2.5x11mm Emerge balloon predilation, 2.75x24mm VeriFLEX bare-metal stent was inserted into the RCA to cover proximal to mid segments of RCA deployed with Noncompliant Sprinter balloon 2.75x63mm up to 2.37mm dilated  . Orif wrist fracture Right 07/03/2013    Procedure: OPEN REDUCTION INTERNAL FIXATION (ORIF) RIGHT WRIST FRACTURE;  Surgeon: Linna Hoff, MD;  Location: [facility];  Service: Orthopedics;  Laterality: Right;   No family history on file. History  Substance Use Topics  . Smoking status: Never Smoker   . Smokeless tobacco: Never Used  . Alcohol Use: Yes     Comment: 2 oz of scotch in evening   OB History   Grav Para Term Preterm Abortions TAB SAB Ect Mult Living                 Review of Systems  Constitutional: Negative for fever and fatigue.  HENT: Negative for congestion and drooling.   Eyes: Negative for pain.  Respiratory: Negative for cough and shortness of breath.   Cardiovascular: Negative for chest pain.  Gastrointestinal: Negative for nausea, vomiting, abdominal pain and diarrhea.  Genitourinary: Negative for dysuria and hematuria.  Musculoskeletal: Negative for back pain, gait problem and neck pain.  Skin: Negative for color change.  Neurological: Negative for dizziness and headaches.  Hematological: Negative for adenopathy.  Psychiatric/Behavioral: Negative for behavioral problems.  All other systems  reviewed and are negative.     Allergies  Aspirin; Codeine; Benadryl; Flu virus vaccine; Penicillins; and Pneumococcal vaccines  Home Medications   Prior to Admission medications   Medication Sig Start Date End Date Taking? Authorizing Provider  acetaminophen (TYLENOL) 325 MG tablet Take 2 tablets (650 mg total) by mouth 3 (three) times daily. 09/07/13  Yes Claudette Jeri Cos, NP  acetaminophen (TYLENOL) 325 MG tablet Take 650 mg by mouth 2 (two) times daily.   Yes Historical Provider, MD  diltiazem (CARDIZEM CD) 180 MG  24 hr capsule Take 180 mg by mouth every morning.    Yes Historical Provider, MD  loperamide (IMODIUM A-D) 2 MG tablet Take 2 mg by mouth 4 (four) times daily as needed for diarrhea or loose stools.   Yes Historical Provider, MD  loratadine (CLARITIN) 10 MG tablet Take 10 mg by mouth daily as needed for allergies.    Yes Historical Provider, MD  LORazepam (ATIVAN) 1 MG tablet Take 0.5-2 mg by mouth 2 (two) times daily. Takes 0.5mg  as needed, 1mg  in am, 2mg  at bedtime   Yes Historical Provider, MD  Melatonin 3 MG TABS Take 1 tablet by mouth at bedtime.   Yes Historical Provider, MD  metoprolol tartrate (LOPRESSOR) 25 MG tablet Take 12.5 mg by mouth 2 (two) times daily.    Yes Historical Provider, MD  pantoprazole (PROTONIX) 40 MG tablet Take 40 mg by mouth every morning.   Yes Historical Provider, MD  rivaroxaban (XARELTO) 20 MG TABS tablet Take 20 mg by mouth daily with supper.   Yes Historical Provider, MD  sertraline (ZOLOFT) 100 MG tablet Take 1 tablet (100 mg total) by mouth daily. 06/06/13  Yes Claudette Jeri Cos, NP  sulfamethoxazole-trimethoprim (BACTRIM DS) 800-160 MG per tablet Take 1 tablet by mouth 2 (two) times daily. 11/11/13 11/17/13 Yes Claudette Jeri Cos, NP  triamterene-hydrochlorothiazide (DYAZIDE) 37.5-25 MG per capsule Take 1 capsule by mouth daily.    Yes Historical Provider, MD  nitroGLYCERIN (NITROSTAT) 0.4 MG SL tablet Place 1 tablet (0.4 mg total) under the tongue every 5 (five) minutes x 3 doses as needed for chest pain. 08/12/12   Tarri Fuller, PA-C   BP 150/61  Pulse 63  Temp(Src) 98.3 F (36.8 C) (Oral)  Resp 16  SpO2 94% Physical Exam  Nursing note and vitals reviewed. Constitutional: She is oriented to person, place, and time. She appears well-developed and well-nourished.  HENT:  Head: Normocephalic and atraumatic.  Mouth/Throat: Oropharynx is clear and moist. No oropharyngeal exudate.  Eyes: Conjunctivae and EOM are normal. Pupils are equal, round, and reactive to  light.  Neck: Normal range of motion. Neck supple.  No vertebral tenderness to palpation noted.  Cardiovascular: Normal rate, regular rhythm, normal heart sounds and intact distal pulses.  Exam reveals no gallop and no friction rub.   No murmur heard. Pulmonary/Chest: Effort normal and breath sounds normal. No respiratory distress. She has no wheezes.  Abdominal: Soft. Bowel sounds are normal. There is no tenderness. There is no rebound and no guarding.  Musculoskeletal: Normal range of motion. She exhibits no edema and no tenderness.  Foot drop in the right foot which is unchanged from baseline.  2+ distal pulses in the lower extremities.  Left lateral hip tenderness to palpation.  Normal range of motion of the right hip without pain.  Normal strength in all extremities. Deferred strength testing in left lower extremity due to left hip pain.  Neurological: She is alert and oriented to  person, place, and time.  Skin: Skin is warm and dry.  Psychiatric: She has a normal mood and affect. Her behavior is normal.    ED Course  Procedures (including critical care time) Labs Review Labs Reviewed  CBC WITH DIFFERENTIAL - Abnormal; Notable for the following:    WBC 12.5 (*)    RBC 3.69 (*)    Hemoglobin 10.9 (*)    HCT 33.2 (*)    Neutrophils Relative % 82 (*)    Neutro Abs 10.3 (*)    Lymphocytes Relative 10 (*)    All other components within normal limits  BASIC METABOLIC PANEL - Abnormal; Notable for the following:    Glucose, Bld 136 (*)    Creatinine, Ser 1.33 (*)    GFR calc non Af Amer 33 (*)    GFR calc Af Amer 38 (*)    All other components within normal limits  PROTIME-INR - Abnormal; Notable for the following:    Prothrombin Time 20.6 (*)    INR 1.83 (*)    All other components within normal limits  URINALYSIS, ROUTINE W REFLEX MICROSCOPIC - Abnormal; Notable for the following:    Leukocytes, UA SMALL (*)    All other components within normal limits  CBC - Abnormal;  Notable for the following:    RBC 3.23 (*)    Hemoglobin 9.6 (*)    HCT 29.4 (*)    All other components within normal limits  BASIC METABOLIC PANEL - Abnormal; Notable for the following:    Glucose, Bld 124 (*)    Creatinine, Ser 1.19 (*)    GFR calc non Af Amer 38 (*)    GFR calc Af Amer 44 (*)    All other components within normal limits  PROTIME-INR - Abnormal; Notable for the following:    Prothrombin Time 18.4 (*)    INR 1.58 (*)    All other components within normal limits  CBC - Abnormal; Notable for the following:    RBC 2.84 (*)    Hemoglobin 8.4 (*)    HCT 25.8 (*)    All other components within normal limits  SURGICAL PCR SCREEN  URINE MICROSCOPIC-ADD ON  APTT  CBC  COMPREHENSIVE METABOLIC PANEL  VITAMIN D 1,25 DIHYDROXY  VITAMIN D 25 HYDROXY  PREALBUMIN  PTH, INTACT AND CALCIUM  TYPE AND SCREEN    Imaging Review Dg Chest 1 View  11/16/2013   CLINICAL DATA:  Preoperative study.  EXAM: CHEST - 1 VIEW  COMPARISON:  Chest x-ray 07/03/2013.  FINDINGS: Patient is rotated to the right which distorts cardiomediastinal contours. No consolidative airspace disease. No definite pleural effusions. No evidence of pulmonary edema. Heart size is mildly enlarged. Skin fold artifact projecting over the lateral aspect of the right hemithorax. Atherosclerosis in the thoracic aorta.  IMPRESSION: 1. No radiographic evidence of acute cardiopulmonary disease. 2. Mild cardiomegaly. 3. Atherosclerosis.   Electronically Signed   By: Vinnie Langton M.D.   On: 11/16/2013 23:38   Dg Hip Complete Left  11/16/2013   CLINICAL DATA:  LEFT anterior hip pain post fall today  EXAM: LEFT HIP - COMPLETE 2+ VIEW  COMPARISON:  Pelvic radiograph 06/14/2013  FINDINGS: Osseous demineralization.  Displaced intertrochanteric fracture LEFT femur with varus angulation.  Deformities of RIGHT superior and inferior pubic rami consistent with old fractures.  No dislocation.  No acute pelvic fracture identified.   Compression deformities of lower lumbar vertebrae.  SI joints symmetric.  Mild BILATERAL hip joint space narrowing.  Scattered atherosclerotic calcifications.  IMPRESSION: Displaced and angulated intertrochanteric fracture LEFT femur.  Old posttraumatic deformities of the RIGHT pubic rami.  Osseous demineralization.  Lower lumbar compression fractures.   Electronically Signed   By: Lavonia Dana M.D.   On: 11/16/2013 19:49   Ct Head Wo Contrast  11/16/2013   CLINICAL DATA:  Fall  EXAM: CT HEAD WITHOUT CONTRAST  TECHNIQUE: Contiguous axial images were obtained from the base of the skull through the vertex without intravenous contrast.  COMPARISON:  Prior CT from 06/28/2013  FINDINGS: Diffuse prominence of the CSF containing spaces is compatible with generalized cerebral atrophy. Scattered and confluent hypodensity within the periventricular and deep white matter of both cerebral hemispheres is consistent with moderate chronic microvascular ischemic disease.  There is no acute intracranial hemorrhage or infarct. No mass lesion or midline shift. Gray-white matter differentiation is well maintained. Ventricles are normal in size without evidence of hydrocephalus. CSF containing spaces are within normal limits. No extra-axial fluid collection.  The calvarium is intact.  Orbital soft tissues are within normal limits.  The paranasal sinuses and mastoid air cells are well pneumatized and free of fluid.  Small left parietal scalp contusion noted.  IMPRESSION: 1. Left parietal scalp contusion. No acute intracranial process identified. 2. Generalized cerebral atrophy with moderate chronic microvascular ischemic disease.   Electronically Signed   By: Jeannine Boga M.D.   On: 11/16/2013 20:05   Dg Knee Left Port  11/17/2013   CLINICAL DATA:  Left hip fracture  EXAM: PORTABLE LEFT KNEE - 1-2 VIEW  COMPARISON:  None.  FINDINGS: Two portable views of the left knee submitted. No acute fracture or subluxation. Mild  narrowing of medial joint compartment. Minimal spurring of medial femoral condyle. Atherosclerotic calcifications of femoral and popliteal artery.  IMPRESSION: No acute fracture or subluxation. Mild narrowing of medial joint compartment. Minimal spurring of medial femoral condyle.   Electronically Signed   By: Lahoma Crocker M.D.   On: 11/17/2013 10:22     EKG Interpretation   Date/Time:  Tuesday Nov 16 2013 20:03:58 EDT Ventricular Rate:  60 PR Interval:  199 QRS Duration: 76 QT Interval:  560 QTC Calculation: 560 R Axis:   36 Text Interpretation:  Sinus rhythm Abnrm T, consider ischemia,  anterolateral lds Prolonged QT interval Confirmed by Sang Blount  MD, Clint Strupp  ([id number]) on 11/17/2013 12:15:14 AM      MDM   Final diagnoses:  Closed left hip fracture  Fall    6:49 PM 80 y.o. female with a history of DVT, PE on Xarelto who presents with a mechanical fall onto her left hip which occurred several hours ago. She denies hitting her head or loss of consciousness. She denies syncope. She currently complains only of left hip pain. Will get screening labwork and imaging.  Pt currently on bactrim for UTI.   Pt found to have intertrochanteric left hip fx. Discussed w/ on call doc for [facility]. Will admit to hospitalist.     Blanchard Kelch, MD 11/17/13 2036

## 2013-11-16 NOTE — H&P (Signed)
[facility] History and Physical  Patient: Cathy Wallace  ZOX:[id number]  DOB: 1917-05-05  DOS: the patient was seen and examined on 11/16/2013 PCP: Estill Dooms, MD  Chief Complaint: Fall  HPI: Cathy Wallace is a 80 y.o. female with Past medical history of right foot drop, DVT PE 2011, hypertension, CAD, pacemaker, GERD, breast cancer, recent right radius fracture. The patient presented with a mechanical fall. She has history of right foot drop and is wearing brace. At night when she was walking with her walker she lost her balance and fall on the ground and started having left-sided hip pain. Pt denies any fever, chills, headache, cough, chest pain, palpitation, shortness of breath, orthopnea, PND, nausea, vomiting, abdominal pain, diarrhea, constipation, active bleeding, burning urination, dizziness, pedal edema,  focal neurological deficit.  She was recently started on Bactrim for UTI. She was also having some blood in her urine for which she was asked to stop taking her Xarelto for few days.  The patient is coming from ALF. And at her baseline independent for most of her ADL.  Review of Systems: as mentioned in the history of present illness.  A Comprehensive review of the other systems is negative.  Past Medical History  Diagnosis Date  . DVT (deep venous thrombosis) 2011  . Pulmonary embolism Jan 2012  . HTN (hypertension)   . Spinal stenosis   . Bradycardia, on admit 08/09/2012  . Acute MI 08/10/2012    2D Echo - EF 55-60%, normal  . Pacemaker   . Depression   . GERD (gastroesophageal reflux disease)   . Cancer     hx of breast cancer  . Arthritis   . Closed pelvic fracture 11/11/2012  . Alcohol dependence 11/15/2012  . Long term (current) use of anticoagulants 11/15/2012  . Sacral fracture, closed 11/15/2012    Bilateral nondisplaced sacral fractures.   . L5 vertebral fracture 11/15/2012    Fracture of the L5 left transverse process.    . Anemia 11/15/2012   . B12 deficiency 12/01/2012  . Insomnia 12/01/2012  . Unspecified vitamin D deficiency 12/07/2012  . Anxiety   . Osteoarthritis 02/10/2013  . Lumbar vertebral fracture 06/15/2013    L3-4 12/15/20143  . Anginal pain   . Hyperlipemia    Past Surgical History  Procedure Laterality Date  . Abdominal hysterectomy    . Elbow surgery    . Cataract extraction Bilateral   . Cardiac catheterization  08/08/2012    2.5x60mm Emerge balloon predilation, 2.75x24mm VeriFLEX bare-metal stent was inserted into the RCA to cover proximal to mid segments of RCA deployed with Noncompliant Sprinter balloon 2.75x69mm up to 2.61mm dilated  . Orif wrist fracture Right 07/03/2013    Procedure: OPEN REDUCTION INTERNAL FIXATION (ORIF) RIGHT WRIST FRACTURE;  Surgeon: Linna Hoff, MD;  Location: [facility];  Service: Orthopedics;  Laterality: Right;   Social History:  reports that she has never smoked. She has never used smokeless tobacco. She reports that she drinks alcohol. She reports that she does not use illicit drugs.  Allergies  Allergen Reactions  . Aspirin Other (See Comments)    taking xarelto  . Codeine Nausea And Vomiting  . Benadryl [Diphenhydramine Hcl] Other (See Comments)    unknown  . Flu Virus Vaccine Other (See Comments)    unknown  . Penicillins Rash  . Pneumococcal Vaccines Other (See Comments)    unknown    No family history on file.  Prior to Admission medications  Medication Sig Start Date End Date Taking? Authorizing Provider  acetaminophen (TYLENOL) 325 MG tablet Take 2 tablets (650 mg total) by mouth 3 (three) times daily. 09/07/13  Yes Claudette Jeri Cos, NP  acetaminophen (TYLENOL) 325 MG tablet Take 650 mg by mouth 2 (two) times daily.   Yes Historical Provider, MD  diltiazem (CARDIZEM CD) 180 MG 24 hr capsule Take 180 mg by mouth every morning.    Yes Historical Provider, MD  loperamide (IMODIUM A-D) 2 MG tablet Take 2 mg by mouth 4 (four) times daily as needed for diarrhea or loose  stools.   Yes Historical Provider, MD  loratadine (CLARITIN) 10 MG tablet Take 10 mg by mouth daily as needed for allergies.    Yes Historical Provider, MD  LORazepam (ATIVAN) 1 MG tablet Take 0.5-2 mg by mouth 2 (two) times daily. Takes 0.5mg  as needed, 1mg  in am, 2mg  at bedtime   Yes Historical Provider, MD  Melatonin 3 MG TABS Take 1 tablet by mouth at bedtime.   Yes Historical Provider, MD  metoprolol tartrate (LOPRESSOR) 25 MG tablet Take 12.5 mg by mouth 2 (two) times daily.    Yes Historical Provider, MD  pantoprazole (PROTONIX) 40 MG tablet Take 40 mg by mouth every morning.   Yes Historical Provider, MD  rivaroxaban (XARELTO) 20 MG TABS tablet Take 20 mg by mouth daily with supper.   Yes Historical Provider, MD  sertraline (ZOLOFT) 100 MG tablet Take 1 tablet (100 mg total) by mouth daily. 06/06/13  Yes Claudette Jeri Cos, NP  sulfamethoxazole-trimethoprim (BACTRIM DS) 800-160 MG per tablet Take 1 tablet by mouth 2 (two) times daily. 11/11/13 11/17/13 Yes Claudette Jeri Cos, NP  triamterene-hydrochlorothiazide (DYAZIDE) 37.5-25 MG per capsule Take 1 capsule by mouth daily.    Yes Historical Provider, MD  nitroGLYCERIN (NITROSTAT) 0.4 MG SL tablet Place 1 tablet (0.4 mg total) under the tongue every 5 (five) minutes x 3 doses as needed for chest pain. 08/12/12   Tarri Fuller, PA-C    Physical Exam: Filed Vitals:   11/16/13 2115 11/16/13 2158 11/16/13 2215 11/16/13 2337  BP: 162/58 127/83 112/55 157/49  Pulse: 67 56  58  Temp:    97.9 F (36.6 C)  TempSrc:    Oral  Resp: 19 10 12 12   Height:    5\' 3"  (1.6 m)  Weight:    53.9 kg (118 lb 13.3 oz)  SpO2: 92% 92% 92% 92%    General: Alert, Awake and Oriented to Time, Place and Person. Appear in mild distress Eyes: PERRL ENT: Oral Mucosa clear moist. Neck: No JVD Cardiovascular: S1 and S2 Present, no Murmur, Peripheral Pulses Present Respiratory: Bilateral Air entry equal and Decreased, Clear to Auscultation,  No Crackles, no  wheezes Abdomen: Bowel Sound Present, Soft and Non tender Skin: No Rash Extremities: Bilateral Pedal edema, no calf tenderness Neurologic: Grossly no focal neuro deficit.  Labs on Admission:  CBC:  Recent Labs Lab 11/11/13 11/16/13 1849  WBC 16.3 12.5*  NEUTROABS  --  10.3*  HGB 12.3 10.9*  HCT 37 33.2*  MCV  --  90.0  PLT 268 241    CMP     Component Value Date/Time   NA 138 11/16/2013 1849   K 3.9 11/16/2013 1849   CL 100 11/16/2013 1849   CO2 22 11/16/2013 1849   GLUCOSE 136* 11/16/2013 1849   BUN 20 11/16/2013 1849   CREATININE 1.33* 11/16/2013 1849   CALCIUM 8.9 11/16/2013 1849   PROT 6.2  08/08/2012 1936   ALBUMIN 3.2* 08/08/2012 1936   AST 57* 08/08/2012 1936   ALT 23 08/08/2012 1936   ALKPHOS 91 08/08/2012 1936   BILITOT 0.3 08/08/2012 1936   GFRNONAA 33* 11/16/2013 1849   GFRAA 38* 11/16/2013 1849    No results found for this basename: LIPASE, AMYLASE,  in the last 168 hours No results found for this basename: AMMONIA,  in the last 168 hours  No results found for this basename: CKTOTAL, CKMB, CKMBINDEX, TROPONINI,  in the last 168 hours BNP (last 3 results) No results found for this basename: PROBNP,  in the last 8760 hours  Radiological Exams on Admission: Dg Chest 1 View  11/16/2013   CLINICAL DATA:  Preoperative study.  EXAM: CHEST - 1 VIEW  COMPARISON:  Chest x-ray 07/03/2013.  FINDINGS: Patient is rotated to the right which distorts cardiomediastinal contours. No consolidative airspace disease. No definite pleural effusions. No evidence of pulmonary edema. Heart size is mildly enlarged. Skin fold artifact projecting over the lateral aspect of the right hemithorax. Atherosclerosis in the thoracic aorta.  IMPRESSION: 1. No radiographic evidence of acute cardiopulmonary disease. 2. Mild cardiomegaly. 3. Atherosclerosis.   Electronically Signed   By: Vinnie Langton M.D.   On: 11/16/2013 23:38   Dg Hip Complete Left  11/16/2013   CLINICAL DATA:  LEFT anterior hip pain post  fall today  EXAM: LEFT HIP - COMPLETE 2+ VIEW  COMPARISON:  Pelvic radiograph 06/14/2013  FINDINGS: Osseous demineralization.  Displaced intertrochanteric fracture LEFT femur with varus angulation.  Deformities of RIGHT superior and inferior pubic rami consistent with old fractures.  No dislocation.  No acute pelvic fracture identified.  Compression deformities of lower lumbar vertebrae.  SI joints symmetric.  Mild BILATERAL hip joint space narrowing.  Scattered atherosclerotic calcifications.  IMPRESSION: Displaced and angulated intertrochanteric fracture LEFT femur.  Old posttraumatic deformities of the RIGHT pubic rami.  Osseous demineralization.  Lower lumbar compression fractures.   Electronically Signed   By: Lavonia Dana M.D.   On: 11/16/2013 19:49   Ct Head Wo Contrast  11/16/2013   CLINICAL DATA:  Fall  EXAM: CT HEAD WITHOUT CONTRAST  TECHNIQUE: Contiguous axial images were obtained from the base of the skull through the vertex without intravenous contrast.  COMPARISON:  Prior CT from 06/28/2013  FINDINGS: Diffuse prominence of the CSF containing spaces is compatible with generalized cerebral atrophy. Scattered and confluent hypodensity within the periventricular and deep white matter of both cerebral hemispheres is consistent with moderate chronic microvascular ischemic disease.  There is no acute intracranial hemorrhage or infarct. No mass lesion or midline shift. Gray-white matter differentiation is well maintained. Ventricles are normal in size without evidence of hydrocephalus. CSF containing spaces are within normal limits. No extra-axial fluid collection.  The calvarium is intact.  Orbital soft tissues are within normal limits.  The paranasal sinuses and mastoid air cells are well pneumatized and free of fluid.  Small left parietal scalp contusion noted.  IMPRESSION: 1. Left parietal scalp contusion. No acute intracranial process identified. 2. Generalized cerebral atrophy with moderate chronic  microvascular ischemic disease.   Electronically Signed   By: Jeannine Boga M.D.   On: 11/16/2013 20:05    EKG: Independently reviewed. normal sinus rhythm, nonspecific ST and T waves changes.  Assessment/Plan Active Problems:   Pulmonary embolism, Jan 2012   DVT (deep venous thrombosis), 2011   HTN (hypertension)   CAD (coronary artery disease), residual 30% septal stenosis   GERD (gastroesophageal reflux  disease)   Breast cancer   Lumbar vertebral fracture   Right foot drop   Closed left hip fracture   1. Closed left hip fracture The patient is presenting with complaints of a mechanical fall. X-ray of the hip is positive for left hip fracture. CT of the head is negative for any acute bleeding she has small hematoma. ' With that at present we will hold her Xarelto. She will be monitored on serial neuro checks. Orthopedic has been consulted for the hip fracture who will be following the patient. Patient will be kept n.p.o. With her history of DVT and age she would be of intermediate risk for procedure.  2. DVT and PE Since her history of DVT is more than 3 months old she would be a reasonable candidate to stop this are also at this point for the procedure and can be resumed once the procedure is performed. No evidence of acute DVT at present.  3. CAD Will need to resume aspirin after the procedure unless she is on Xarelto.  4. UTI Patient is supposed to continue 10 days of UTI treatment with Bactrim. I would switch the Bactrim to Cipro  5. Mild worsening of serum creatinine IV hydration continue monitoring, stop Bactrim.  6. Anemia Iron supplementation  Consults: Orthopedics  DVT Prophylaxis: subcutaneous Heparin Nutrition: N.p.o.  Code Status: DNR/DNI  Disposition: Admitted to inpatient in med-surge unit.  Author: Berle Mull, MD [facility] Pager: [phone] 11/16/2013, 11:50 PM    If 7PM-7AM, please contact  night-coverage [email or web address] Password [email or web address]

## 2013-11-16 NOTE — ED Notes (Signed)
MD Aline Brochure made aware of pt's continued pain. Md at bedside now evaluating.

## 2013-11-16 NOTE — ED Notes (Signed)
pts son took home patients credit card, hearing aids, shoes and pillow. Left pt with a pocket book with no money, two books and glasses.

## 2013-11-16 NOTE — ED Notes (Signed)
Per EMS: Pt from [facility]. Reports falling today (unsure why) and now has left sided hip pain with shortening to left leg. Pulses intact. Hip brace in place. PT is AO x4, denies LOC or head injury. AO x4. Hx: takes Cardizem, Xarelto VSS

## 2013-11-16 NOTE — ED Notes (Signed)
Patient transported to X-ray 

## 2013-11-17 ENCOUNTER — Inpatient Hospital Stay (HOSPITAL_COMMUNITY): Payer: Medicare Other

## 2013-11-17 DIAGNOSIS — S72009A Fracture of unspecified part of neck of unspecified femur, initial encounter for closed fracture: Secondary | ICD-10-CM | POA: Insufficient documentation

## 2013-11-17 DIAGNOSIS — I10 Essential (primary) hypertension: Secondary | ICD-10-CM

## 2013-11-17 DIAGNOSIS — W19XXXA Unspecified fall, initial encounter: Secondary | ICD-10-CM

## 2013-11-17 DIAGNOSIS — D649 Anemia, unspecified: Secondary | ICD-10-CM

## 2013-11-17 DIAGNOSIS — F3289 Other specified depressive episodes: Secondary | ICD-10-CM

## 2013-11-17 DIAGNOSIS — K219 Gastro-esophageal reflux disease without esophagitis: Secondary | ICD-10-CM

## 2013-11-17 DIAGNOSIS — F329 Major depressive disorder, single episode, unspecified: Secondary | ICD-10-CM

## 2013-11-17 DIAGNOSIS — I2510 Atherosclerotic heart disease of native coronary artery without angina pectoris: Secondary | ICD-10-CM

## 2013-11-17 DIAGNOSIS — I2699 Other pulmonary embolism without acute cor pulmonale: Secondary | ICD-10-CM

## 2013-11-17 DIAGNOSIS — I82409 Acute embolism and thrombosis of unspecified deep veins of unspecified lower extremity: Secondary | ICD-10-CM

## 2013-11-17 LAB — CBC
HCT: 29.4 % — ABNORMAL LOW (ref 36.0–46.0)
HEMATOCRIT: 25.8 % — AB (ref 36.0–46.0)
HEMOGLOBIN: 8.4 g/dL — AB (ref 12.0–15.0)
Hemoglobin: 9.6 g/dL — ABNORMAL LOW (ref 12.0–15.0)
MCH: 29.7 pg (ref 26.0–34.0)
MCH: 29.6 pg (ref 26.0–34.0)
MCHC: 32.7 g/dL (ref 30.0–36.0)
MCHC: 32.6 g/dL (ref 30.0–36.0)
MCV: 91 fL (ref 78.0–100.0)
MCV: 90.8 fL (ref 78.0–100.0)
Platelets: 226 10*3/uL (ref 150–400)
Platelets: 202 10*3/uL (ref 150–400)
RBC: 3.23 MIL/uL — ABNORMAL LOW (ref 3.87–5.11)
RBC: 2.84 MIL/uL — ABNORMAL LOW (ref 3.87–5.11)
RDW: 14.2 % (ref 11.5–15.5)
RDW: 14.2 % (ref 11.5–15.5)
WBC: 9.7 10*3/uL (ref 4.0–10.5)
WBC: 8.3 10*3/uL (ref 4.0–10.5)

## 2013-11-17 LAB — BASIC METABOLIC PANEL
BUN: 18 mg/dL (ref 6–23)
CALCIUM: 8.7 mg/dL (ref 8.4–10.5)
CHLORIDE: 101 meq/L (ref 96–112)
CO2: 22 mEq/L (ref 19–32)
CREATININE: 1.19 mg/dL — AB (ref 0.50–1.10)
GFR calc non Af Amer: 38 mL/min — ABNORMAL LOW (ref >90)
GFR, EST AFRICAN AMERICAN: 44 mL/min — AB (ref >90)
Glucose, Bld: 124 mg/dL — ABNORMAL HIGH (ref 70–99)
Potassium: 3.9 mEq/L (ref 3.7–5.3)
Sodium: 137 mEq/L (ref 137–147)

## 2013-11-17 LAB — PROTIME-INR
INR: 1.58 — AB (ref 0.00–1.49)
Prothrombin Time: 18.4 seconds — ABNORMAL HIGH (ref 11.6–15.2)

## 2013-11-17 LAB — SURGICAL PCR SCREEN
MRSA, PCR: NEGATIVE
STAPHYLOCOCCUS AUREUS: NEGATIVE

## 2013-11-17 LAB — APTT: aPTT: 36 seconds (ref 24–37)

## 2013-11-17 MED ORDER — CIPROFLOXACIN HCL 250 MG PO TABS
250.0000 mg | ORAL_TABLET | Freq: Every day | ORAL | Status: DC
  Administered 2013-11-17 – 2013-11-23 (×6): 250 mg via ORAL
  Filled 2013-11-17 (×8): qty 1

## 2013-11-17 MED ORDER — MORPHINE SULFATE 2 MG/ML IJ SOLN
0.5000 mg | INTRAMUSCULAR | Status: DC | PRN
  Administered 2013-11-19: 0.5 mg via INTRAVENOUS
  Filled 2013-11-17: qty 1

## 2013-11-17 MED ORDER — BIOTENE DRY MOUTH MT LIQD
15.0000 mL | Freq: Two times a day (BID) | OROMUCOSAL | Status: DC
  Administered 2013-11-17 – 2013-11-21 (×2): 15 mL via OROMUCOSAL

## 2013-11-17 MED ORDER — LORATADINE 10 MG PO TABS
10.0000 mg | ORAL_TABLET | Freq: Every day | ORAL | Status: DC | PRN
  Administered 2013-11-19: 10 mg via ORAL
  Filled 2013-11-17: qty 1

## 2013-11-17 MED ORDER — PANTOPRAZOLE SODIUM 40 MG PO TBEC
40.0000 mg | DELAYED_RELEASE_TABLET | Freq: Every morning | ORAL | Status: DC
  Administered 2013-11-17 – 2013-11-23 (×7): 40 mg via ORAL
  Filled 2013-11-17 (×7): qty 1

## 2013-11-17 MED ORDER — DILTIAZEM HCL ER COATED BEADS 180 MG PO CP24
180.0000 mg | ORAL_CAPSULE | Freq: Every morning | ORAL | Status: DC
  Administered 2013-11-17 – 2013-11-23 (×6): 180 mg via ORAL
  Filled 2013-11-17 (×7): qty 1

## 2013-11-17 MED ORDER — METOPROLOL TARTRATE 12.5 MG HALF TABLET
12.5000 mg | ORAL_TABLET | Freq: Two times a day (BID) | ORAL | Status: DC
  Administered 2013-11-17 – 2013-11-23 (×12): 12.5 mg via ORAL
  Filled 2013-11-17 (×18): qty 1

## 2013-11-17 MED ORDER — LORAZEPAM 0.5 MG PO TABS
0.5000 mg | ORAL_TABLET | Freq: Two times a day (BID) | ORAL | Status: DC | PRN
  Administered 2013-11-18 – 2013-11-23 (×6): 0.5 mg via ORAL
  Filled 2013-11-17 (×6): qty 1

## 2013-11-17 MED ORDER — HYDROCODONE-ACETAMINOPHEN 5-325 MG PO TABS
1.0000 | ORAL_TABLET | Freq: Four times a day (QID) | ORAL | Status: DC | PRN
  Administered 2013-11-17: 2 via ORAL
  Administered 2013-11-17: 1 via ORAL
  Administered 2013-11-18 – 2013-11-19 (×3): 2 via ORAL
  Administered 2013-11-19 – 2013-11-20 (×2): 1 via ORAL
  Administered 2013-11-20 – 2013-11-22 (×4): 2 via ORAL
  Filled 2013-11-17: qty 2
  Filled 2013-11-17: qty 1
  Filled 2013-11-17 (×2): qty 2
  Filled 2013-11-17: qty 1
  Filled 2013-11-17 (×2): qty 2
  Filled 2013-11-17: qty 1
  Filled 2013-11-17 (×3): qty 2

## 2013-11-17 MED ORDER — VANCOMYCIN HCL IN DEXTROSE 1-5 GM/200ML-% IV SOLN
1000.0000 mg | Freq: Once | INTRAVENOUS | Status: AC
  Administered 2013-11-18: 1000 mg via INTRAVENOUS
  Filled 2013-11-17: qty 200

## 2013-11-17 MED ORDER — SODIUM CHLORIDE 0.9 % IV SOLN
INTRAVENOUS | Status: DC
  Administered 2013-11-17 – 2013-11-18 (×2): via INTRAVENOUS

## 2013-11-17 MED ORDER — ACETAMINOPHEN 325 MG PO TABS
650.0000 mg | ORAL_TABLET | Freq: Two times a day (BID) | ORAL | Status: DC
  Administered 2013-11-17 – 2013-11-23 (×12): 650 mg via ORAL
  Filled 2013-11-17 (×12): qty 2

## 2013-11-17 MED ORDER — FERROUS SULFATE 325 (65 FE) MG PO TABS
325.0000 mg | ORAL_TABLET | Freq: Every day | ORAL | Status: DC
  Administered 2013-11-17 – 2013-11-23 (×6): 325 mg via ORAL
  Filled 2013-11-17 (×10): qty 1

## 2013-11-17 MED ORDER — HEPARIN SODIUM (PORCINE) 5000 UNIT/ML IJ SOLN
5000.0000 [IU] | Freq: Three times a day (TID) | INTRAMUSCULAR | Status: DC
  Filled 2013-11-17 (×9): qty 1

## 2013-11-17 MED ORDER — CHLORHEXIDINE GLUCONATE 0.12 % MT SOLN
15.0000 mL | Freq: Two times a day (BID) | OROMUCOSAL | Status: DC
  Administered 2013-11-17 – 2013-11-21 (×5): 15 mL via OROMUCOSAL
  Filled 2013-11-17 (×7): qty 15

## 2013-11-17 MED ORDER — SERTRALINE HCL 100 MG PO TABS
100.0000 mg | ORAL_TABLET | Freq: Every day | ORAL | Status: DC
  Administered 2013-11-17 – 2013-11-23 (×6): 100 mg via ORAL
  Filled 2013-11-17 (×7): qty 1

## 2013-11-17 MED ORDER — NITROGLYCERIN 0.4 MG SL SUBL: 0.4000 mg | SUBLINGUAL_TABLET | SUBLINGUAL | Status: DC | PRN

## 2013-11-17 NOTE — Progress Notes (Signed)
Orthopedic Tech Progress Note Patient Details:  Cathy Wallace 20-May-1917 [id number]  To weak to use overhead.  Patient ID: Cathy Wallace, female   DOB: Jun 06, 1917, 80 y.o.   MRN: [id number]   Ashok Cordia 11/17/2013, 11:31 AM

## 2013-11-17 NOTE — Progress Notes (Addendum)
INITIAL NUTRITION ASSESSMENT  DOCUMENTATION CODES Per approved criteria  -Not Applicable   INTERVENTION: 1.  General healthful diet; encourage intake of foods and beverages as able. Discussed nutrition needs and intake goals with pt.  Offered supplements with pt declines at this time.  RD to follow and assess for nutritional adequacy.  NUTRITION DIAGNOSIS: Increased nutrient needs related to healing as evidenced by s/p hip fx, planned surgical repair.   Monitor:  1.  Food/Beverage; pt meeting >/=90% estimated needs with tolerance. 2.  Wt/wt change; monitor trends  Reason for Assessment: MST  80 y.o. female  Admitting Dx: Closed left hip fracture  ASSESSMENT: Pt admitted with closed left hip fracture s/p fall.   Pt states that she was recently discharged from rehab and was doing well at the time of her fall.  She states that 7 years ago when she moved into [facility] she initially lost ~10 lbs.  Since that time she has maintained her weight at 118 lbs.  Pt states that she eats 4 meals per day.  She gets most of her meals from the dining hall and rarely cooks.  Dietary recall not revealing, but she does report sample meals such as meat and salad, sandwiches, or fruit and cookies. She does not like supplements, "they make me upset."  Weight hx shows variable weight status ranging from 106-118 lbs.  RD to follow for adequate intake. Pt states she feels her nutrition is adequate and she is meeting her needs. Pt feels she has adequate support at facility for nutrition and intake.  Nutrition Focused Physical Exam: Subcutaneous Fat:  Orbital Region: WNL Upper Arm Region: WNL Thoracic and Lumbar Region: WNL  Muscle:  Temple Region: WNL Clavicle Bone Region: mild wasting Clavicle and Acromion Bone Region: mild wasting Scapular Bone Region: WNL Dorsal Hand: moderate wasting Patellar Region: not assessed Anterior Thigh Region: not assessed Posterior Calf Region: not  assessed  Edema: none present   Height: Ht Readings from Last 1 Encounters:  11/16/13 5\' 3"  (1.6 m)    Weight: Wt Readings from Last 1 Encounters:  11/16/13 118 lb 13.3 oz (53.9 kg)    Ideal Body Weight: 115 lbs  % Ideal Body Weight: 102%  Wt Readings from Last 10 Encounters:  11/16/13 118 lb 13.3 oz (53.9 kg)  10/20/13 113 lb (51.256 kg)  09/29/13 111 lb (50.349 kg)  08/31/13 109 lb 9.6 oz (49.714 kg)  08/13/13 110 lb (49.896 kg)  07/26/13 106 lb 14.4 oz (48.49 kg)  07/03/13 113 lb (51.256 kg)  07/03/13 113 lb (51.256 kg)  06/22/13 106 lb 9.6 oz (48.353 kg)  06/15/13 109 lb (49.442 kg)    Usual Body Weight: 118 lbs per pt, 105-110 lbs per chart review  % Usual Body Weight: 100%  BMI:  Body mass index is 21.05 kg/(m^2).  Estimated Nutritional Needs: Kcal: 4315-4008 gProtein: 64- Fluid: ~1.8 L/day  Skin: intact  Diet Order: Full Liquid  EDUCATION NEEDS: -Education needs addressed   Intake/Output Summary (Last 24 hours) at 11/17/13 1106 Last data filed at 11/17/13 0545  Gross per 24 hour  Intake  367.5 ml  Output    575 ml  Net -207.5 ml    Last BM: 5/18  Labs:   Recent Labs Lab 11/16/13 1849 11/17/13 0532  NA 138 137  K 3.9 3.9  CL 100 101  CO2 22 22  BUN 20 18  CREATININE 1.33* 1.19*  CALCIUM 8.9 8.7  GLUCOSE 136* 124*    CBG (last  3)  No results found for this basename: GLUCAP,  in the last 72 hours  Scheduled Meds: . acetaminophen  650 mg Oral BID  . antiseptic oral rinse  15 mL Mouth Rinse q12n4p  . chlorhexidine  15 mL Mouth Rinse BID  . ciprofloxacin  250 mg Oral Q breakfast  . diltiazem  180 mg Oral q morning - 10a  . ferrous sulfate  325 mg Oral Q breakfast  . heparin  5,000 Units Subcutaneous 3 times per day  . metoprolol tartrate  12.5 mg Oral BID  . pantoprazole  40 mg Oral q morning - 10a  . sertraline  100 mg Oral Daily  . [START ON 11/18/2013] vancomycin  1,000 mg Intravenous Once    Continuous Infusions: .  sodium chloride 75 mL/hr at 11/17/13 0051    Past Medical History  Diagnosis Date  . DVT (deep venous thrombosis) 2011  . Pulmonary embolism Jan 2012  . HTN (hypertension)   . Spinal stenosis   . Bradycardia, on admit 08/09/2012  . Acute MI 08/10/2012    2D Echo - EF 55-60%, normal  . Pacemaker   . Depression   . GERD (gastroesophageal reflux disease)   . Cancer     hx of breast cancer  . Arthritis   . Closed pelvic fracture 11/11/2012  . Alcohol dependence 11/15/2012  . Long term (current) use of anticoagulants 11/15/2012  . Sacral fracture, closed 11/15/2012    Bilateral nondisplaced sacral fractures.   . L5 vertebral fracture 11/15/2012    Fracture of the L5 left transverse process.    . Anemia 11/15/2012  . B12 deficiency 12/01/2012  . Insomnia 12/01/2012  . Unspecified vitamin D deficiency 12/07/2012  . Anxiety   . Osteoarthritis 02/10/2013  . Lumbar vertebral fracture 06/15/2013    L3-4 12/15/20143  . Anginal pain   . Hyperlipemia     Past Surgical History  Procedure Laterality Date  . Abdominal hysterectomy    . Elbow surgery    . Cataract extraction Bilateral   . Cardiac catheterization  08/08/2012    2.5x52mm Emerge balloon predilation, 2.75x59mm VeriFLEX bare-metal stent was inserted into the RCA to cover proximal to mid segments of RCA deployed with Noncompliant Sprinter balloon 2.75x43mm up to 2.64mm dilated  . Orif wrist fracture Right 07/03/2013    Procedure: OPEN REDUCTION INTERNAL FIXATION (ORIF) RIGHT WRIST FRACTURE;  Surgeon: Linna Hoff, MD;  Location: [facility];  Service: Orthopedics;  Laterality: Right;    Brynda Greathouse, MS RD LDN Clinical Inpatient Dietitian Pager: [phone] Weekend/After hours pager: [phone]

## 2013-11-17 NOTE — Clinical Social Work Note (Signed)
CSW received report from RN during progression of pt residing at [facility] prior to admission. Per RN, pt is to undergo surgery either 11/17/2013 or 11/18/2013. CSW will continue to follow and assist pt with discharge planning needs.  Pati Gallo, [place] Social Worker [phone]

## 2013-11-17 NOTE — Consult Note (Signed)
Orthopaedic Trauma Service Consultation  Reason for Consult: left intertrochanteric hip fracture Referring Physician: Aline Brochure MD, EDP  Cathy Wallace is an 80 y.o. female.  HPI: Pleasant female, living at [facility] in independent living, fell from ground level with left leg pain and inability to ambulate.  Comfortable presently in bed.  Denies numbness tingling in LLE.  Baseline footdrop on the R for which she uses an AFO.  Has stopped Xarelto for hematuria. H/o DVT in RLE. Denies LOC or other injury.  Past Medical History  Diagnosis Date  . DVT (deep venous thrombosis) 2011  . Pulmonary embolism Jan 2012  . HTN (hypertension)   . Spinal stenosis   . Bradycardia, on admit 08/09/2012  . Acute MI 08/10/2012    2D Echo - EF 55-60%, normal  . Pacemaker   . Depression   . GERD (gastroesophageal reflux disease)   . Cancer     hx of breast cancer  . Arthritis   . Closed pelvic fracture 11/11/2012  . Alcohol dependence 11/15/2012  . Long term (current) use of anticoagulants 11/15/2012  . Sacral fracture, closed 11/15/2012    Bilateral nondisplaced sacral fractures.   . L5 vertebral fracture 11/15/2012    Fracture of the L5 left transverse process.    . Anemia 11/15/2012  . B12 deficiency 12/01/2012  . Insomnia 12/01/2012  . Unspecified vitamin D deficiency 12/07/2012  . Anxiety   . Osteoarthritis 02/10/2013  . Lumbar vertebral fracture 06/15/2013    L3-4 12/15/20143  . Anginal pain   . Hyperlipemia     Past Surgical History  Procedure Laterality Date  . Abdominal hysterectomy    . Elbow surgery    . Cataract extraction Bilateral   . Cardiac catheterization  08/08/2012    2.5x95mm Emerge balloon predilation, 2.75x53mm VeriFLEX bare-metal stent was inserted into the RCA to cover proximal to mid segments of RCA deployed with Noncompliant Sprinter balloon 2.75x38mm up to 2.29mm dilated  . Orif wrist fracture Right 07/03/2013    Procedure: OPEN REDUCTION INTERNAL FIXATION (ORIF) RIGHT WRIST  FRACTURE;  Surgeon: Linna Hoff, MD;  Location: [facility];  Service: Orthopedics;  Laterality: Right;    No family history on file.  Social History:  reports that she has never smoked. She has never used smokeless tobacco. She reports that she drinks alcohol. She reports that she does not use illicit drugs.  Allergies:  Allergies  Allergen Reactions  . Aspirin Other (See Comments)    taking xarelto  . Codeine Nausea And Vomiting  . Benadryl [Diphenhydramine Hcl] Other (See Comments)    unknown  . Flu Virus Vaccine Other (See Comments)    unknown  . Penicillins Rash  . Pneumococcal Vaccines Other (See Comments)    unknown    Medications: I have reviewed the patient's current medications.  Results for orders placed during the hospital encounter of 11/16/13 (from the past 48 hour(s))  CBC WITH DIFFERENTIAL     Status: Abnormal   Collection Time    11/16/13  6:49 PM      Result Value Ref Range   WBC 12.5 (*) 4.0 - 10.5 K/uL   RBC 3.69 (*) 3.87 - 5.11 MIL/uL   Hemoglobin 10.9 (*) 12.0 - 15.0 g/dL   HCT 33.2 (*) 36.0 - 46.0 %   MCV 90.0  78.0 - 100.0 fL   MCH 29.5  26.0 - 34.0 pg   MCHC 32.8  30.0 - 36.0 g/dL   RDW 14.3  11.5 - 15.5 %  Platelets 241  150 - 400 K/uL   Neutrophils Relative % 82 (*) 43 - 77 %   Neutro Abs 10.3 (*) 1.7 - 7.7 K/uL   Lymphocytes Relative 10 (*) 12 - 46 %   Lymphs Abs 1.3  0.7 - 4.0 K/uL   Monocytes Relative 7  3 - 12 %   Monocytes Absolute 0.9  0.1 - 1.0 K/uL   Eosinophils Relative 1  0 - 5 %   Eosinophils Absolute 0.1  0.0 - 0.7 K/uL   Basophils Relative 0  0 - 1 %   Basophils Absolute 0.0  0.0 - 0.1 K/uL  BASIC METABOLIC PANEL     Status: Abnormal   Collection Time    11/16/13  6:49 PM      Result Value Ref Range   Sodium 138  137 - 147 mEq/L   Potassium 3.9  3.7 - 5.3 mEq/L   Chloride 100  96 - 112 mEq/L   CO2 22  19 - 32 mEq/L   Glucose, Bld 136 (*) 70 - 99 mg/dL   BUN 20  6 - 23 mg/dL   Creatinine, Ser 1.33 (*) 0.50 - 1.10 mg/dL    Calcium 8.9  8.4 - 10.5 mg/dL   GFR calc non Af Amer 33 (*) >90 mL/min   GFR calc Af Amer 38 (*) >90 mL/min   Comment: (NOTE)     The eGFR has been calculated using the CKD EPI equation.     This calculation has not been validated in all clinical situations.     eGFR's persistently <90 mL/min signify possible Chronic Kidney     Disease.  PROTIME-INR     Status: Abnormal   Collection Time    11/16/13  6:49 PM      Result Value Ref Range   Prothrombin Time 20.6 (*) 11.6 - 15.2 seconds   INR 1.83 (*) 0.00 - 1.49  URINALYSIS, ROUTINE W REFLEX MICROSCOPIC     Status: Abnormal   Collection Time    11/16/13 10:41 PM      Result Value Ref Range   Color, Urine YELLOW  YELLOW   APPearance CLEAR  CLEAR   Specific Gravity, Urine 1.017  1.005 - 1.030   pH 5.5  5.0 - 8.0   Glucose, UA NEGATIVE  NEGATIVE mg/dL   Hgb urine dipstick NEGATIVE  NEGATIVE   Bilirubin Urine NEGATIVE  NEGATIVE   Ketones, ur NEGATIVE  NEGATIVE mg/dL   Protein, ur NEGATIVE  NEGATIVE mg/dL   Urobilinogen, UA 0.2  0.0 - 1.0 mg/dL   Nitrite NEGATIVE  NEGATIVE   Leukocytes, UA SMALL (*) NEGATIVE  URINE MICROSCOPIC-ADD ON     Status: None   Collection Time    11/16/13 10:41 PM      Result Value Ref Range   Squamous Epithelial / LPF RARE  RARE   WBC, UA 3-6  <3 WBC/hpf   RBC / HPF 0-2  <3 RBC/hpf  CBC     Status: Abnormal   Collection Time    11/17/13  5:32 AM      Result Value Ref Range   WBC 9.7  4.0 - 10.5 K/uL   RBC 3.23 (*) 3.87 - 5.11 MIL/uL   Hemoglobin 9.6 (*) 12.0 - 15.0 g/dL   HCT 29.4 (*) 36.0 - 46.0 %   MCV 91.0  78.0 - 100.0 fL   MCH 29.7  26.0 - 34.0 pg   MCHC 32.7  30.0 - 36.0 g/dL  RDW 14.2  11.5 - 15.5 %   Platelets 226  150 - 400 K/uL  BASIC METABOLIC PANEL     Status: Abnormal   Collection Time    11/17/13  5:32 AM      Result Value Ref Range   Sodium 137  137 - 147 mEq/L   Potassium 3.9  3.7 - 5.3 mEq/L   Chloride 101  96 - 112 mEq/L   CO2 22  19 - 32 mEq/L   Glucose, Bld 124 (*) 70 -  99 mg/dL   BUN 18  6 - 23 mg/dL   Creatinine, Ser 1.19 (*) 0.50 - 1.10 mg/dL   Calcium 8.7  8.4 - 10.5 mg/dL   GFR calc non Af Amer 38 (*) >90 mL/min   GFR calc Af Amer 44 (*) >90 mL/min   Comment: (NOTE)     The eGFR has been calculated using the CKD EPI equation.     This calculation has not been validated in all clinical situations.     eGFR's persistently <90 mL/min signify possible Chronic Kidney     Disease.  TYPE AND SCREEN     Status: None   Collection Time    11/17/13  5:32 AM      Result Value Ref Range   ABO/RH(D) O POS     Antibody Screen NEG     Sample Expiration 11/20/2013    APTT     Status: None   Collection Time    11/17/13  5:32 AM      Result Value Ref Range   aPTT 36  24 - 37 seconds  PROTIME-INR     Status: Abnormal   Collection Time    11/17/13  5:32 AM      Result Value Ref Range   Prothrombin Time 18.4 (*) 11.6 - 15.2 seconds   INR 1.58 (*) 0.00 - 1.49    Dg Chest 1 View  11/16/2013   CLINICAL DATA:  Preoperative study.  EXAM: CHEST - 1 VIEW  COMPARISON:  Chest x-ray 07/03/2013.  FINDINGS: Patient is rotated to the right which distorts cardiomediastinal contours. No consolidative airspace disease. No definite pleural effusions. No evidence of pulmonary edema. Heart size is mildly enlarged. Skin fold artifact projecting over the lateral aspect of the right hemithorax. Atherosclerosis in the thoracic aorta.  IMPRESSION: 1. No radiographic evidence of acute cardiopulmonary disease. 2. Mild cardiomegaly. 3. Atherosclerosis.   Electronically Signed   By: Vinnie Langton M.D.   On: 11/16/2013 23:38   Dg Hip Complete Left  11/16/2013   CLINICAL DATA:  LEFT anterior hip pain post fall today  EXAM: LEFT HIP - COMPLETE 2+ VIEW  COMPARISON:  Pelvic radiograph 06/14/2013  FINDINGS: Osseous demineralization.  Displaced intertrochanteric fracture LEFT femur with varus angulation.  Deformities of RIGHT superior and inferior pubic rami consistent with old fractures.  No  dislocation.  No acute pelvic fracture identified.  Compression deformities of lower lumbar vertebrae.  SI joints symmetric.  Mild BILATERAL hip joint space narrowing.  Scattered atherosclerotic calcifications.  IMPRESSION: Displaced and angulated intertrochanteric fracture LEFT femur.  Old posttraumatic deformities of the RIGHT pubic rami.  Osseous demineralization.  Lower lumbar compression fractures.   Electronically Signed   By: Lavonia Dana M.D.   On: 11/16/2013 19:49   Ct Head Wo Contrast  11/16/2013   CLINICAL DATA:  Fall  EXAM: CT HEAD WITHOUT CONTRAST  TECHNIQUE: Contiguous axial images were obtained from the base of the skull through the  vertex without intravenous contrast.  COMPARISON:  Prior CT from 06/28/2013  FINDINGS: Diffuse prominence of the CSF containing spaces is compatible with generalized cerebral atrophy. Scattered and confluent hypodensity within the periventricular and deep white matter of both cerebral hemispheres is consistent with moderate chronic microvascular ischemic disease.  There is no acute intracranial hemorrhage or infarct. No mass lesion or midline shift. Gray-white matter differentiation is well maintained. Ventricles are normal in size without evidence of hydrocephalus. CSF containing spaces are within normal limits. No extra-axial fluid collection.  The calvarium is intact.  Orbital soft tissues are within normal limits.  The paranasal sinuses and mastoid air cells are well pneumatized and free of fluid.  Small left parietal scalp contusion noted.  IMPRESSION: 1. Left parietal scalp contusion. No acute intracranial process identified. 2. Generalized cerebral atrophy with moderate chronic microvascular ischemic disease.   Electronically Signed   By: Jeannine Boga M.D.   On: 11/16/2013 20:05    ROS Blood pressure 132/32, pulse 60, temperature 98.2 F (36.8 C), temperature source Oral, resp. rate 15, height $RemoveBe'5\' 3"'NamQIAHvv$  (1.6 m), weight 118 lb 13.3 oz (53.9 kg), SpO2  95.00%. Physical Exam Cor[place]/AT No wheezing S/NT/ND LLE No traumatic wounds, ecchymosis, or rash  Tender thigh, nontender knee  Sens DPN, SPN, TN intact  Motor EHL, ext, flex, evers 5/5  nonpalp pulses, No significant edema RLE foot drop, no edema  Assessment/Plan: L intertroch fracture, plan for IMN tomorrow   I discussed with the patient and her son and daughter-in-law the risks and benefits of surgery, including the possibility of infection, nerve injury, vessel injury, symptomatic hardware, DVT/ PE, and need for further surgery among others.  We also specifically discussed the perioperative complications that could include death, DVT/PE, stroke, or heart attack.  The patient and her family understood these risks and wished to proceed.   Altamese Holden, MD [facility] [phone] (p)   11/17/2013  9:08 AM

## 2013-11-17 NOTE — Progress Notes (Signed)
[facility]                                                                              Patient Demographics  Cathy Wallace, is a 80 y.o. female, DOB - [birth year], NLG:[id number]  Admit date - 11/16/2013   Admitting Physician Cathy Mull, MD  Outpatient Primary MD for the patient is GREEN, Cathy Spare, MD  LOS - 1   Chief Complaint  Patient presents with  . Fall  . Hip Pain      HPI: Cathy Wallace is a 80 y.o. female with Past medical history of right foot drop, DVT PE 2011, hypertension, CAD, pacemaker, GERD, breast cancer, recent right radius fracture. The patient presented with a mechanical fall. She has a history of right foot drop and is wearing brace. At night when she was walking with her walker, she lost her balance and fell to the ground and started having left-sided hip pain.  At time of admission, patient denied any fever, chills, headache, cough, chest pain, palpitation, shortness of breath, orthopnea, PND, nausea, vomiting, abdominal pain, diarrhea, constipation, active bleeding, burning urination, dizziness, pedal edema, focal neurological deficit.  She was recently started on Bactrim for UTI.  She was also having some blood in her urine for which she was asked to stop taking her Xarelto for few days.  Patient resides at [facility].   Assessment & Plan   Left intertrochanteric hip fracture -Left hip Xray: Displaced and angulated intertrochanteric fracture LEFT femur. -CT of the head: Left parietal scalp contusion. No acute intracranial process identified. -Orthopedic surgeries been consulted -Patient currently n.p.o. for possible surgery today or tomorrow 11/17/2013 -Given her age and history of DVT patient is intermediate risk procedure -Will consult PT and OT post surgery -Will also consult social worker for possible placement, patient currently at assisted living facility at [place].  History of PE and DVT -Xarelto has been held  Coronary artery  disease -Patient currently chest pain-free -Continue Cardizem, metoprolol  Urinary tract infection -Diagnosis outpatient, patient was placed on Bactrim for 10 days - Continue Cipro  Acute kidney injury -Mild bump in creatinine -Will hold Bactrim, will give IV fluids and continue to monitor BMP  Normocytic Anemia -Patient's baseline hemoglobin appears to be between 8-10 -Hemoglobin currently 9.6 -Will continue ferrous sulfate  Depression -Continue Zoloft  GERD -Continue PPI  Code Status: DO NOT RESUSCITATE  Family Communication: None at bedside  Disposition Plan: Admitted  Time Spent in minutes   30 minutes  Procedures none  Consults   Orthopedic surgery  DVT Prophylaxis  heparin  Lab Results  Component Value Date   PLT 226 11/17/2013    Medications  Scheduled Meds: . acetaminophen  650 mg Oral BID  . antiseptic oral rinse  15 mL Mouth Rinse q12n4p  . chlorhexidine  15 mL Mouth Rinse BID  . ciprofloxacin  250 mg Oral Q breakfast  . diltiazem  180 mg Oral q morning - 10a  . ferrous sulfate  325 mg Oral Q breakfast  . heparin  5,000 Units Subcutaneous 3 times per day  . metoprolol tartrate  12.5 mg Oral BID  . pantoprazole  40 mg Oral q  morning - 10a  . sertraline  100 mg Oral Daily   Continuous Infusions: . sodium chloride 75 mL/hr at 11/17/13 0051   PRN Meds:.HYDROcodone-acetaminophen, loratadine, LORazepam, morphine injection, nitroGLYCERIN  Antibiotics    Anti-infectives   Start     Dose/Rate Route Frequency Ordered Stop   11/17/13 0800  ciprofloxacin (CIPRO) tablet 250 mg    Comments:  Cipro 250 mg po Daily for UTI with CrCl < 30 mL/min   250 mg Oral Daily with breakfast 11/17/13 0026         Subjective:   Cathy Wallace seen and examined today.  Patient currently complains of left hip pain. She denies any chest pain shortness of breath or abdominal pain at this time. Patient doesn't require and her surgery will be.  Objective:   Filed  Vitals:   11/16/13 2337 11/17/13 0400 11/17/13 0603 11/17/13 0800  BP: 157/49  132/32   Pulse: 58  60   Temp: 97.9 F (36.6 C)  98.2 F (36.8 C)   TempSrc: Oral  Oral   Resp: 12 14 15 15   Height: 5\' 3"  (1.6 m)     Weight: 53.9 kg (118 lb 13.3 oz)     SpO2: 92% 94% 95% 95%    Wt Readings from Last 3 Encounters:  11/16/13 53.9 kg (118 lb 13.3 oz)  10/20/13 51.256 kg (113 lb)  09/29/13 50.349 kg (111 lb)     Intake/Output Summary (Last 24 hours) at 11/17/13 0900 Last data filed at 11/17/13 0545  Gross per 24 hour  Intake  367.5 ml  Output    575 ml  Net -207.5 ml    Exam  General: Well developed, well nourished, NAD, appears stated age  HEENT: NCAT, PERRLA, EOMI, Anicteic Sclera, mucous membranes moist.   Neck: Supple, no JVD, no masses  Cardiovascular: S1 S2 auscultated, no rubs, murmurs or gallops. Regular rate and rhythm.  Respiratory: Clear to auscultation bilaterally with equal chest rise  Abdomen: Soft, nontender, nondistended, + bowel sounds  Extremities: warm dry without cyanosis clubbing, pain to palpation of the left hip and thigh area. Bilateral lower extremity edema  Neuro: AAOx3, cranial nerves grossly intact. Strength 5/5 in patient's upper and lower extremities bilaterally  Skin: Without rashes exudates or nodules  Psych: Normal affect and demeanor with intact judgement and insight  Data Review   Micro Results No results found for this or any previous visit (from the past 240 hour(s)).  Radiology Reports Dg Chest 1 View  11/16/2013   CLINICAL DATA:  Preoperative study.  EXAM: CHEST - 1 VIEW  COMPARISON:  Chest x-ray 07/03/2013.  FINDINGS: Patient is rotated to the right which distorts cardiomediastinal contours. No consolidative airspace disease. No definite pleural effusions. No evidence of pulmonary edema. Heart size is mildly enlarged. Skin fold artifact projecting over the lateral aspect of the right hemithorax. Atherosclerosis in the thoracic  aorta.  IMPRESSION: 1. No radiographic evidence of acute cardiopulmonary disease. 2. Mild cardiomegaly. 3. Atherosclerosis.   Electronically Signed   By: Vinnie Langton M.D.   On: 11/16/2013 23:38   Dg Hip Complete Left  11/16/2013   CLINICAL DATA:  LEFT anterior hip pain post fall today  EXAM: LEFT HIP - COMPLETE 2+ VIEW  COMPARISON:  Pelvic radiograph 06/14/2013  FINDINGS: Osseous demineralization.  Displaced intertrochanteric fracture LEFT femur with varus angulation.  Deformities of RIGHT superior and inferior pubic rami consistent with old fractures.  No dislocation.  No acute pelvic fracture identified.  Compression deformities  of lower lumbar vertebrae.  SI joints symmetric.  Mild BILATERAL hip joint space narrowing.  Scattered atherosclerotic calcifications.  IMPRESSION: Displaced and angulated intertrochanteric fracture LEFT femur.  Old posttraumatic deformities of the RIGHT pubic rami.  Osseous demineralization.  Lower lumbar compression fractures.   Electronically Signed   By: Lavonia Dana M.D.   On: 11/16/2013 19:49   Ct Head Wo Contrast  11/16/2013   CLINICAL DATA:  Fall  EXAM: CT HEAD WITHOUT CONTRAST  TECHNIQUE: Contiguous axial images were obtained from the base of the skull through the vertex without intravenous contrast.  COMPARISON:  Prior CT from 06/28/2013  FINDINGS: Diffuse prominence of the CSF containing spaces is compatible with generalized cerebral atrophy. Scattered and confluent hypodensity within the periventricular and deep white matter of both cerebral hemispheres is consistent with moderate chronic microvascular ischemic disease.  There is no acute intracranial hemorrhage or infarct. No mass lesion or midline shift. Gray-white matter differentiation is well maintained. Ventricles are normal in size without evidence of hydrocephalus. CSF containing spaces are within normal limits. No extra-axial fluid collection.  The calvarium is intact.  Orbital soft tissues are within normal  limits.  The paranasal sinuses and mastoid air cells are well pneumatized and free of fluid.  Small left parietal scalp contusion noted.  IMPRESSION: 1. Left parietal scalp contusion. No acute intracranial process identified. 2. Generalized cerebral atrophy with moderate chronic microvascular ischemic disease.   Electronically Signed   By: Jeannine Boga M.D.   On: 11/16/2013 20:05    CBC  Recent Labs Lab 11/11/13 11/16/13 1849 11/17/13 0532  WBC 16.3 12.5* 9.7  HGB 12.3 10.9* 9.6*  HCT 37 33.2* 29.4*  PLT 268 241 226  MCV  --  90.0 91.0  MCH  --  29.5 29.7  MCHC  --  32.8 32.7  RDW  --  14.3 14.2  LYMPHSABS  --  1.3  --   MONOABS  --  0.9  --   EOSABS  --  0.1  --   BASOSABS  --  0.0  --     Chemistries   Recent Labs Lab 11/16/13 1849 11/17/13 0532  NA 138 137  K 3.9 3.9  CL 100 101  CO2 22 22  GLUCOSE 136* 124*  BUN 20 18  CREATININE 1.33* 1.19*  CALCIUM 8.9 8.7   ------------------------------------------------------------------------------------------------------------------ estimated creatinine clearance is 23.9 ml/min (by C-G formula based on Cr of 1.19). ------------------------------------------------------------------------------------------------------------------ No results found for this basename: HGBA1C,  in the last 72 hours ------------------------------------------------------------------------------------------------------------------ No results found for this basename: CHOL, HDL, LDLCALC, TRIG, CHOLHDL, LDLDIRECT,  in the last 72 hours ------------------------------------------------------------------------------------------------------------------ No results found for this basename: TSH, T4TOTAL, FREET3, T3FREE, THYROIDAB,  in the last 72 hours ------------------------------------------------------------------------------------------------------------------ No results found for this basename: VITAMINB12, FOLATE, FERRITIN, TIBC, IRON, RETICCTPCT,   in the last 72 hours  Coagulation profile  Recent Labs Lab 11/16/13 1849 11/17/13 0532  INR 1.83* 1.58*    No results found for this basename: DDIMER,  in the last 72 hours  Cardiac Enzymes No results found for this basename: CK, CKMB, TROPONINI, MYOGLOBIN,  in the last 168 hours ------------------------------------------------------------------------------------------------------------------ No components found with this basename: POCBNP,     Uriah Philipson D.O. on 11/17/2013 at 9:00 AM  Between 7am to 7pm - Pager - [phone]  After 7pm go to [email or web address] - password [email or web address]  And look for the night coverage person covering for me after hours  [facility] Office  [phone]

## 2013-11-18 ENCOUNTER — Inpatient Hospital Stay (HOSPITAL_COMMUNITY): Payer: Medicare Other | Admitting: Anesthesiology

## 2013-11-18 ENCOUNTER — Encounter (HOSPITAL_COMMUNITY): Payer: Medicare Other | Admitting: Anesthesiology

## 2013-11-18 ENCOUNTER — Inpatient Hospital Stay (HOSPITAL_COMMUNITY): Payer: Medicare Other

## 2013-11-18 ENCOUNTER — Encounter (HOSPITAL_COMMUNITY)
Admission: EM | Disposition: A | Discharge status: Skilled Nursing Facility | Payer: Self-pay | Source: Home / Self Care | Attending: Internal Medicine

## 2013-11-18 ENCOUNTER — Encounter (HOSPITAL_COMMUNITY): Payer: Self-pay | Admitting: Anesthesiology

## 2013-11-18 DIAGNOSIS — Z7901 Long term (current) use of anticoagulants: Secondary | ICD-10-CM

## 2013-11-18 DIAGNOSIS — E538 Deficiency of other specified B group vitamins: Secondary | ICD-10-CM

## 2013-11-18 DIAGNOSIS — S329XXA Fracture of unspecified parts of lumbosacral spine and pelvis, initial encounter for closed fracture: Secondary | ICD-10-CM

## 2013-11-18 DIAGNOSIS — C50919 Malignant neoplasm of unspecified site of unspecified female breast: Secondary | ICD-10-CM

## 2013-11-18 HISTORY — PX: INTRAMEDULLARY (IM) NAIL INTERTROCHANTERIC: SHX5875

## 2013-11-18 LAB — COMPREHENSIVE METABOLIC PANEL
ALBUMIN: 2.7 g/dL — AB (ref 3.5–5.2)
ALT: 15 U/L (ref 0–35)
AST: 34 U/L (ref 0–37)
Alkaline Phosphatase: 72 U/L (ref 39–117)
BUN: 14 mg/dL (ref 6–23)
CHLORIDE: 104 meq/L (ref 96–112)
CO2: 23 mEq/L (ref 19–32)
Calcium: 8.4 mg/dL (ref 8.4–10.5)
Creatinine, Ser: 1.12 mg/dL — ABNORMAL HIGH (ref 0.50–1.10)
GFR calc Af Amer: 47 mL/min — ABNORMAL LOW (ref >90)
GFR calc non Af Amer: 41 mL/min — ABNORMAL LOW (ref >90)
Glucose, Bld: 118 mg/dL — ABNORMAL HIGH (ref 70–99)
Potassium: 3.9 mEq/L (ref 3.7–5.3)
Sodium: 139 mEq/L (ref 137–147)
Total Bilirubin: 0.4 mg/dL (ref 0.3–1.2)
Total Protein: 5.2 g/dL — ABNORMAL LOW (ref 6.0–8.3)

## 2013-11-18 LAB — CBC
HEMATOCRIT: 23.9 % — AB (ref 36.0–46.0)
HEMOGLOBIN: 7.7 g/dL — AB (ref 12.0–15.0)
MCH: 29.6 pg (ref 26.0–34.0)
MCHC: 32.2 g/dL (ref 30.0–36.0)
MCV: 91.9 fL (ref 78.0–100.0)
Platelets: 181 10*3/uL (ref 150–400)
RBC: 2.6 MIL/uL — ABNORMAL LOW (ref 3.87–5.11)
RDW: 14.5 % (ref 11.5–15.5)
WBC: 9.4 10*3/uL (ref 4.0–10.5)

## 2013-11-18 LAB — HEMOGLOBIN AND HEMATOCRIT, BLOOD
HCT: 31.5 % — ABNORMAL LOW (ref 36.0–46.0)
HEMOGLOBIN: 10.4 g/dL — AB (ref 12.0–15.0)

## 2013-11-18 LAB — PREALBUMIN: Prealbumin: 17.2 mg/dL — ABNORMAL LOW (ref 17.0–34.0)

## 2013-11-18 LAB — VITAMIN D 25 HYDROXY (VIT D DEFICIENCY, FRACTURES): Vit D, 25-Hydroxy: 28 ng/mL — ABNORMAL LOW (ref 30–89)

## 2013-11-18 SURGERY — FIXATION, FRACTURE, INTERTROCHANTERIC, WITH INTRAMEDULLARY ROD
Anesthesia: General | Site: Hip | Laterality: Left

## 2013-11-18 MED ORDER — LACTATED RINGERS IV SOLN
INTRAVENOUS | Status: DC | PRN
  Administered 2013-11-18: 10:00:00 via INTRAVENOUS

## 2013-11-18 MED ORDER — ROCURONIUM BROMIDE 50 MG/5ML IV SOLN
INTRAVENOUS | Status: AC
  Filled 2013-11-18: qty 1

## 2013-11-18 MED ORDER — VANCOMYCIN HCL IN DEXTROSE 1-5 GM/200ML-% IV SOLN
1000.0000 mg | Freq: Two times a day (BID) | INTRAVENOUS | Status: AC
  Administered 2013-11-18: 1000 mg via INTRAVENOUS
  Filled 2013-11-18: qty 200

## 2013-11-18 MED ORDER — LIDOCAINE HCL (CARDIAC) 20 MG/ML IV SOLN
INTRAVENOUS | Status: DC | PRN
  Administered 2013-11-18: 30 mg via INTRAVENOUS

## 2013-11-18 MED ORDER — METOCLOPRAMIDE HCL 5 MG/ML IJ SOLN: 5.0000 mg | Freq: Three times a day (TID) | INTRAMUSCULAR | Status: DC | PRN

## 2013-11-18 MED ORDER — PROPOFOL 10 MG/ML IV BOLUS
INTRAVENOUS | Status: AC
  Filled 2013-11-18: qty 20

## 2013-11-18 MED ORDER — ONDANSETRON HCL 4 MG/2ML IJ SOLN
INTRAMUSCULAR | Status: AC
  Filled 2013-11-18: qty 2

## 2013-11-18 MED ORDER — ONDANSETRON HCL 4 MG/2ML IJ SOLN: 4.0000 mg | Freq: Once | INTRAMUSCULAR | Status: DC | PRN

## 2013-11-18 MED ORDER — SODIUM CHLORIDE 0.9 % IV SOLN
INTRAVENOUS | Status: DC | PRN
  Administered 2013-11-18: 08:00:00 via INTRAVENOUS

## 2013-11-18 MED ORDER — DIPHENHYDRAMINE HCL 12.5 MG/5ML PO ELIX: 12.5000 mg | ORAL_SOLUTION | Freq: Three times a day (TID) | ORAL | Status: DC | PRN

## 2013-11-18 MED ORDER — MENTHOL 3 MG MT LOZG: 1.0000 | LOZENGE | OROMUCOSAL | Status: DC | PRN

## 2013-11-18 MED ORDER — PHENOL 1.4 % MT LIQD: 1.0000 | OROMUCOSAL | Status: DC | PRN

## 2013-11-18 MED ORDER — ONDANSETRON HCL 4 MG PO TABS: 4.0000 mg | ORAL_TABLET | Freq: Four times a day (QID) | ORAL | Status: DC | PRN

## 2013-11-18 MED ORDER — ONDANSETRON HCL 4 MG/2ML IJ SOLN: 4.0000 mg | Freq: Four times a day (QID) | INTRAMUSCULAR | Status: DC | PRN

## 2013-11-18 MED ORDER — PROPOFOL 10 MG/ML IV BOLUS
INTRAVENOUS | Status: DC | PRN
  Administered 2013-11-18: 70 mg via INTRAVENOUS

## 2013-11-18 MED ORDER — ONDANSETRON HCL 4 MG/2ML IJ SOLN
INTRAMUSCULAR | Status: DC | PRN
  Administered 2013-11-18: 4 mg via INTRAVENOUS

## 2013-11-18 MED ORDER — FENTANYL CITRATE 0.05 MG/ML IJ SOLN
INTRAMUSCULAR | Status: DC | PRN
  Administered 2013-11-18 (×5): 50 ug via INTRAVENOUS

## 2013-11-18 MED ORDER — GLYCOPYRROLATE 0.2 MG/ML IJ SOLN
INTRAMUSCULAR | Status: DC | PRN
  Administered 2013-11-18: 0.3 mg via INTRAVENOUS

## 2013-11-18 MED ORDER — METOCLOPRAMIDE HCL 5 MG PO TABS: 5.0000 mg | ORAL_TABLET | Freq: Three times a day (TID) | ORAL | Status: DC | PRN

## 2013-11-18 MED ORDER — ROCURONIUM BROMIDE 100 MG/10ML IV SOLN
INTRAVENOUS | Status: DC | PRN
  Administered 2013-11-18: 25 mg via INTRAVENOUS
  Administered 2013-11-18: 10 mg via INTRAVENOUS

## 2013-11-18 MED ORDER — 0.9 % SODIUM CHLORIDE (POUR BTL) OPTIME
TOPICAL | Status: DC | PRN
  Administered 2013-11-18: 1000 mL

## 2013-11-18 MED ORDER — EPHEDRINE SULFATE 50 MG/ML IJ SOLN
INTRAMUSCULAR | Status: DC | PRN
  Administered 2013-11-18 (×2): 10 mg via INTRAVENOUS

## 2013-11-18 MED ORDER — CLINDAMYCIN PHOSPHATE 900 MG/50ML IV SOLN
INTRAVENOUS | Status: AC
Start: 2013-11-18 — End: 2013-11-18
  Filled 2013-11-18: qty 50

## 2013-11-18 MED ORDER — FENTANYL CITRATE 0.05 MG/ML IJ SOLN: 25.0000 ug | INTRAMUSCULAR | Status: DC | PRN

## 2013-11-18 MED ORDER — DIPHENHYDRAMINE HCL 12.5 MG/5ML PO ELIX: 12.5000 mg | ORAL_SOLUTION | Freq: Once | ORAL | Status: DC

## 2013-11-18 MED ORDER — FENTANYL CITRATE 0.05 MG/ML IJ SOLN
INTRAMUSCULAR | Status: AC
  Filled 2013-11-18: qty 5

## 2013-11-18 MED ORDER — LIDOCAINE HCL (CARDIAC) 20 MG/ML IV SOLN
INTRAVENOUS | Status: AC
  Filled 2013-11-18: qty 5

## 2013-11-18 MED ORDER — NEOSTIGMINE METHYLSULFATE 10 MG/10ML IV SOLN
INTRAVENOUS | Status: DC | PRN
  Administered 2013-11-18: 2 mg via INTRAVENOUS

## 2013-11-18 SURGICAL SUPPLY — 54 items
BIT DRILL 4.3MMS DISTAL GRDTED (BIT) ×1 IMPLANT
BNDG COHESIVE 6X5 TAN STRL LF (GAUZE/BANDAGES/DRESSINGS) IMPLANT
BRUSH SCRUB DISP (MISCELLANEOUS) ×6 IMPLANT
COVER PERINEAL POST (MISCELLANEOUS) ×3 IMPLANT
COVER SURGICAL LIGHT HANDLE (MISCELLANEOUS) ×6 IMPLANT
DRAPE C-ARMOR (DRAPES) ×3 IMPLANT
DRAPE ORTHO SPLIT 77X108 STRL (DRAPES)
DRAPE PROXIMA HALF (DRAPES) IMPLANT
DRAPE STERI IOBAN 125X83 (DRAPES) ×3 IMPLANT
DRAPE SURG ORHT 6 SPLT 77X108 (DRAPES) IMPLANT
DRAPE U-SHAPE 47X51 STRL (DRAPES) ×3 IMPLANT
DRILL 4.3MMS DISTAL GRADUATED (BIT) ×3
DRSG EMULSION OIL 3X3 NADH (GAUZE/BANDAGES/DRESSINGS) ×3 IMPLANT
DRSG MEPILEX BORDER 4X4 (GAUZE/BANDAGES/DRESSINGS) ×9 IMPLANT
DRSG MEPILEX BORDER 4X8 (GAUZE/BANDAGES/DRESSINGS) ×3 IMPLANT
ELECT REM PT RETURN 9FT ADLT (ELECTROSURGICAL) ×3
ELECTRODE REM PT RTRN 9FT ADLT (ELECTROSURGICAL) ×1 IMPLANT
GLOVE BIO SURGEON STRL SZ7.5 (GLOVE) ×3 IMPLANT
GLOVE BIO SURGEON STRL SZ8 (GLOVE) ×9 IMPLANT
GLOVE BIOGEL PI IND STRL 7.5 (GLOVE) ×1 IMPLANT
GLOVE BIOGEL PI IND STRL 8 (GLOVE) ×1 IMPLANT
GLOVE BIOGEL PI INDICATOR 7.5 (GLOVE) ×2
GLOVE BIOGEL PI INDICATOR 8 (GLOVE) ×2
GOWN STRL REUS W/ TWL LRG LVL3 (GOWN DISPOSABLE) ×2 IMPLANT
GOWN STRL REUS W/ TWL XL LVL3 (GOWN DISPOSABLE) ×1 IMPLANT
GOWN STRL REUS W/TWL LRG LVL3 (GOWN DISPOSABLE) ×4
GOWN STRL REUS W/TWL XL LVL3 (GOWN DISPOSABLE) ×2
GUIDEPIN 3.2X17.5 THRD DISP (PIN) ×3 IMPLANT
GUIDEWIRE BALL NOSE 100CM (WIRE) ×3 IMPLANT
HIP FRA NAIL LAG SCREW 10.5X90 (Orthopedic Implant) ×3 IMPLANT
HIP FRAC NAIL LEFT 11X360MM (Orthopedic Implant) ×3 IMPLANT
KIT BASIN OR (CUSTOM PROCEDURE TRAY) ×3 IMPLANT
KIT ROOM TURNOVER OR (KITS) ×3 IMPLANT
LINER BOOT UNIVERSAL DISP (MISCELLANEOUS) ×3 IMPLANT
MANIFOLD NEPTUNE II (INSTRUMENTS) ×3 IMPLANT
NAIL HIP FRAC LEFT 11X360MM (Orthopedic Implant) ×1 IMPLANT
NS IRRIG 1000ML POUR BTL (IV SOLUTION) ×3 IMPLANT
PACK GENERAL/GYN (CUSTOM PROCEDURE TRAY) ×3 IMPLANT
PAD ARMBOARD 7.5X6 YLW CONV (MISCELLANEOUS) ×6 IMPLANT
SCREW BONE CORTICAL 5.0X38 (Screw) ×3 IMPLANT
SCREW LAG HIP FRA NAIL 10.5X90 (Orthopedic Implant) ×1 IMPLANT
SCREWDRIVER HEX TIP 3.5MM (MISCELLANEOUS) ×3 IMPLANT
STAPLER VISISTAT 35W (STAPLE) ×3 IMPLANT
STOCKINETTE IMPERVIOUS LG (DRAPES) IMPLANT
SUT ETHILON 3 0 PS 1 (SUTURE) ×6 IMPLANT
SUT VIC AB 0 CT1 27 (SUTURE) ×2
SUT VIC AB 0 CT1 27XBRD ANBCTR (SUTURE) ×1 IMPLANT
SUT VIC AB 1 CT1 27 (SUTURE) ×2
SUT VIC AB 1 CT1 27XBRD ANBCTR (SUTURE) ×1 IMPLANT
SUT VIC AB 2-0 CT1 27 (SUTURE) ×2
SUT VIC AB 2-0 CT1 TAPERPNT 27 (SUTURE) ×1 IMPLANT
TOWEL OR 17X24 6PK STRL BLUE (TOWEL DISPOSABLE) ×3 IMPLANT
TOWEL OR 17X26 10 PK STRL BLUE (TOWEL DISPOSABLE) ×6 IMPLANT
WATER STERILE IRR 1000ML POUR (IV SOLUTION) IMPLANT

## 2013-11-18 NOTE — Transfer of Care (Signed)
Immediate Anesthesia Transfer of Care Note  Patient: Cathy Wallace  Procedure(s) Performed: Procedure(s): IM NAIL  LEFT HIP (Left)  Patient Location: PACU  Anesthesia Type:General  Level of Consciousness: awake, alert , oriented and patient cooperative  Airway & Oxygen Therapy: Patient Spontanous Breathing and Patient connected to nasal cannula oxygen  Post-op Assessment: Report given to PACU RN, Post -op Vital signs reviewed and stable and Patient moving all extremities  Post vital signs: Reviewed and stable  Complications: No apparent anesthesia complications

## 2013-11-18 NOTE — Brief Op Note (Signed)
11/16/2013 - 11/18/2013  10:25 AM  PATIENT:  Cathy Wallace  80 y.o. female  PRE-OPERATIVE DIAGNOSIS:  Left 3 part intertrochanteric fracture  POST-OPERATIVE DIAGNOSIS: Left 3 part intertrochanteric fracture  PROCEDURE:  Procedure(s): IM NAIL  LEFT HIP (Left) with Biomet Affixus 11 x 360 mm, statically locked  SURGEON:  Surgeon(s) and Role:    * Rozanna Box, MD - Primary  ANESTHESIA:   general  I/O:  Total I/O In: 9767 [I.V.:1000; Blood:582] Out: 225 [Urine:175; Blood:50]  SPECIMEN:  No Specimen  TOURNIQUET:  * No tourniquets in log *  DICTATION: .Other Dictation: Dictation Number [id number]

## 2013-11-18 NOTE — Progress Notes (Signed)
[facility]                                                                              Patient Demographics  Cathy Wallace, is a 80 y.o. female, DOB - 08-17-1916, MBT:[id number]  Admit date - 11/16/2013   Admitting Physician Berle Mull, MD  Outpatient Primary MD for the patient is GREEN, Viviann Spare, MD  LOS - 2   Chief Complaint  Patient presents with  . Fall  . Hip Pain      HPI: Cathy Wallace is a 80 y.o. female with Past medical history of right foot drop, DVT PE 2011, hypertension, CAD, pacemaker, GERD, breast cancer, recent right radius fracture. The patient presented with a mechanical fall. She has a history of right foot drop and is wearing brace. At night when she was walking with her walker, she lost her balance and fell to the ground and started having left-sided hip pain.  At time of admission, patient denied any fever, chills, headache, cough, chest pain, palpitation, shortness of breath, orthopnea, PND, nausea, vomiting, abdominal pain, diarrhea, constipation, active bleeding, burning urination, dizziness, pedal edema, focal neurological deficit.  She was recently started on Bactrim for UTI.  She was also having some blood in her urine for which she was asked to stop taking her Xarelto for few days.  Patient resides at [facility].   Assessment & Plan   Left intertrochanteric hip fracture -Left hip Xray: Displaced and angulated intertrochanteric fracture LEFT femur. -CT of the head: Left parietal scalp contusion. No acute intracranial process identified. -Orthopedic surgeries been consulted -Surgery today 11/17/2013 -Given her age and history of DVT patient is intermediate risk procedure -Will consult PT and OT for eval and treatment  -Will also consult social worker for possible placement, patient currently at assisted living facility at [place].  History of PE and DVT -Xarelto has been held  Coronary artery disease -Patient currently chest  pain-free -Continue Cardizem, metoprolol  Urinary tract infection -Diagnosis outpatient, patient was placed on Bactrim for 10 days -Continue Cipro  Acute kidney injury -Mild bump in creatinine -Will hold Bactrim, will give IV fluids and continue to monitor BMP  Normocytic Anemia -Patient's baseline hemoglobin appears to be between 8-10 -Hemoglobin currently 7.7 -Will continue ferrous sulfate -Will continue to monitor CBC  Depression -Continue Zoloft  GERD -Continue PPI  Code Status: DO NOT RESUSCITATE  Family Communication: None at bedside  Disposition Plan: Admitted  Time Spent in minutes   25 minutes  Procedures  IM Nail Left hip  Consults   Orthopedic surgery  DVT Prophylaxis  heparin  Lab Results  Component Value Date   PLT 181 11/18/2013    Medications  Scheduled Meds: . acetaminophen  650 mg Oral BID  . antiseptic oral rinse  15 mL Mouth Rinse q12n4p  . chlorhexidine  15 mL Mouth Rinse BID  . ciprofloxacin  250 mg Oral Q breakfast  . clindamycin      . diltiazem  180 mg Oral q morning - 10a  . diphenhydrAMINE  12.5 mg Oral Once  . ferrous sulfate  325 mg Oral Q breakfast  . heparin  5,000 Units Subcutaneous 3  times per day  . metoprolol tartrate  12.5 mg Oral BID  . pantoprazole  40 mg Oral q morning - 10a  . sertraline  100 mg Oral Daily   Continuous Infusions: . sodium chloride 75 mL/hr at 11/18/13 1158   PRN Meds:.diphenhydrAMINE, HYDROcodone-acetaminophen, loratadine, LORazepam, menthol-cetylpyridinium, metoCLOPramide (REGLAN) injection, metoCLOPramide, morphine injection, nitroGLYCERIN, ondansetron (ZOFRAN) IV, ondansetron, phenol  Antibiotics    Anti-infectives   Start     Dose/Rate Route Frequency Ordered Stop   11/18/13 1130  vancomycin (VANCOCIN) IVPB 1000 mg/200 mL premix     1,000 mg 200 mL/hr over 60 Minutes Intravenous Every 12 hours 11/18/13 1122 11/18/13 1330   11/18/13 0800  vancomycin (VANCOCIN) IVPB 1000 mg/200 mL premix      1,000 mg 200 mL/hr over 60 Minutes Intravenous  Once 11/17/13 0913 11/18/13 1321   11/18/13 0755  clindamycin (CLEOCIN) 900 MG/50ML IVPB    Comments:  [name]   : cabinet override      11/18/13 0755 11/18/13 1959   11/17/13 0800  ciprofloxacin (CIPRO) tablet 250 mg    Comments:  Cipro 250 mg po Daily for UTI with CrCl < 30 mL/min   250 mg Oral Daily with breakfast 11/17/13 0026         Subjective:   Cathy Wallace seen and examined today.  Patient states she tired after her surgery and would like to take a nap.  She has no other complaints at this time.  Objective:   Filed Vitals:   11/18/13 1057 11/18/13 1100 11/18/13 1102 11/18/13 1115  BP: 147/61   134/44  Pulse:   121 53  Temp:   97.6 F (36.4 C) 97.9 F (36.6 C)  TempSrc:    Oral  Resp:  11 15 14   Height:      Weight:      SpO2: 94% 83% 100% 95%    Wt Readings from Last 3 Encounters:  11/16/13 53.9 kg (118 lb 13.3 oz)  11/16/13 53.9 kg (118 lb 13.3 oz)  10/20/13 51.256 kg (113 lb)     Intake/Output Summary (Last 24 hours) at 11/18/13 1412 Last data filed at 11/18/13 1039  Gross per 24 hour  Intake   2022 ml  Output   1025 ml  Net    997 ml    Exam  General: Well developed, well nourished, NAD, appears stated age  83: NCAT, mucous membranes moist.   Neck: Supple, no JVD, no masses  Cardiovascular: S1 S2 auscultated, no rubs, murmurs or gallops. Regular rate and rhythm.  Respiratory: Clear to auscultation bilaterally with equal chest rise  Abdomen: Soft, nontender, nondistended, + bowel sounds  Extremities: warm dry without cyanosis clubbing  Neuro: AAOx3, no focal deficits  Skin: Without rashes exudates or nodules  Psych: Normal affect and demeanor with intact judgement and insight  Data Review   Micro Results Recent Results (from the past 240 hour(s))  SURGICAL PCR SCREEN     Status: None   Collection Time    11/17/13  4:21 PM      Result Value Ref Range Status   MRSA,  PCR NEGATIVE  NEGATIVE Final   Staphylococcus aureus NEGATIVE  NEGATIVE Final   Comment:            The Xpert SA Assay (FDA     approved for NASAL specimens     in patients over 13 years of age),     is one component of     a comprehensive  surveillance     program.  Test performance has     been validated by [facility] for patients greater     than or equal to 18 year old.     It is not intended     to diagnose infection nor to     guide or monitor treatment.    Radiology Reports Dg Chest 1 View  11/16/2013   CLINICAL DATA:  Preoperative study.  EXAM: CHEST - 1 VIEW  COMPARISON:  Chest x-ray 07/03/2013.  FINDINGS: Patient is rotated to the right which distorts cardiomediastinal contours. No consolidative airspace disease. No definite pleural effusions. No evidence of pulmonary edema. Heart size is mildly enlarged. Skin fold artifact projecting over the lateral aspect of the right hemithorax. Atherosclerosis in the thoracic aorta.  IMPRESSION: 1. No radiographic evidence of acute cardiopulmonary disease. 2. Mild cardiomegaly. 3. Atherosclerosis.   Electronically Signed   By: Vinnie Langton M.D.   On: 11/16/2013 23:38   Dg Hip Complete Left  11/16/2013   CLINICAL DATA:  LEFT anterior hip pain post fall today  EXAM: LEFT HIP - COMPLETE 2+ VIEW  COMPARISON:  Pelvic radiograph 06/14/2013  FINDINGS: Osseous demineralization.  Displaced intertrochanteric fracture LEFT femur with varus angulation.  Deformities of RIGHT superior and inferior pubic rami consistent with old fractures.  No dislocation.  No acute pelvic fracture identified.  Compression deformities of lower lumbar vertebrae.  SI joints symmetric.  Mild BILATERAL hip joint space narrowing.  Scattered atherosclerotic calcifications.  IMPRESSION: Displaced and angulated intertrochanteric fracture LEFT femur.  Old posttraumatic deformities of the RIGHT pubic rami.  Osseous demineralization.  Lower lumbar compression fractures.    Electronically Signed   By: Lavonia Dana M.D.   On: 11/16/2013 19:49   Ct Head Wo Contrast  11/16/2013   CLINICAL DATA:  Fall  EXAM: CT HEAD WITHOUT CONTRAST  TECHNIQUE: Contiguous axial images were obtained from the base of the skull through the vertex without intravenous contrast.  COMPARISON:  Prior CT from 06/28/2013  FINDINGS: Diffuse prominence of the CSF containing spaces is compatible with generalized cerebral atrophy. Scattered and confluent hypodensity within the periventricular and deep white matter of both cerebral hemispheres is consistent with moderate chronic microvascular ischemic disease.  There is no acute intracranial hemorrhage or infarct. No mass lesion or midline shift. Gray-white matter differentiation is well maintained. Ventricles are normal in size without evidence of hydrocephalus. CSF containing spaces are within normal limits. No extra-axial fluid collection.  The calvarium is intact.  Orbital soft tissues are within normal limits.  The paranasal sinuses and mastoid air cells are well pneumatized and free of fluid.  Small left parietal scalp contusion noted.  IMPRESSION: 1. Left parietal scalp contusion. No acute intracranial process identified. 2. Generalized cerebral atrophy with moderate chronic microvascular ischemic disease.   Electronically Signed   By: Jeannine Boga M.D.   On: 11/16/2013 20:05    CBC  Recent Labs Lab 11/16/13 1849 11/17/13 0532 11/17/13 1520 11/18/13 0427 11/18/13 1315  WBC 12.5* 9.7 8.3 9.4  --   HGB 10.9* 9.6* 8.4* 7.7* 10.4*  HCT 33.2* 29.4* 25.8* 23.9* 31.5*  PLT 241 226 202 181  --   MCV 90.0 91.0 90.8 91.9  --   MCH 29.5 29.7 29.6 29.6  --   MCHC 32.8 32.7 32.6 32.2  --   RDW 14.3 14.2 14.2 14.5  --   LYMPHSABS 1.3  --   --   --   --  MONOABS 0.9  --   --   --   --   EOSABS 0.1  --   --   --   --   BASOSABS 0.0  --   --   --   --     Chemistries   Recent Labs Lab 11/16/13 1849 11/17/13 0532 11/18/13 0427  NA 138 137  139  K 3.9 3.9 3.9  CL 100 101 104  CO2 22 22 23   GLUCOSE 136* 124* 118*  BUN 20 18 14   CREATININE 1.33* 1.19* 1.12*  CALCIUM 8.9 8.7 8.4  AST  --   --  34  ALT  --   --  15  ALKPHOS  --   --  72  BILITOT  --   --  0.4   ------------------------------------------------------------------------------------------------------------------ estimated creatinine clearance is 25.4 ml/min (by C-G formula based on Cr of 1.12). ------------------------------------------------------------------------------------------------------------------ No results found for this basename: HGBA1C,  in the last 72 hours ------------------------------------------------------------------------------------------------------------------ No results found for this basename: CHOL, HDL, LDLCALC, TRIG, CHOLHDL, LDLDIRECT,  in the last 72 hours ------------------------------------------------------------------------------------------------------------------ No results found for this basename: TSH, T4TOTAL, FREET3, T3FREE, THYROIDAB,  in the last 72 hours ------------------------------------------------------------------------------------------------------------------ No results found for this basename: VITAMINB12, FOLATE, FERRITIN, TIBC, IRON, RETICCTPCT,  in the last 72 hours  Coagulation profile  Recent Labs Lab 11/16/13 1849 11/17/13 0532  INR 1.83* 1.58*    No results found for this basename: DDIMER,  in the last 72 hours  Cardiac Enzymes No results found for this basename: CK, CKMB, TROPONINI, MYOGLOBIN,  in the last 168 hours ------------------------------------------------------------------------------------------------------------------ No components found with this basename: POCBNP,     Nihira Puello D.O. on 11/18/2013 at 2:12 PM  Between 7am to 7pm - Pager - [phone]  After 7pm go to [email or web address] - password [email or web address]  And look for the night coverage person covering for me after hours  [facility] Office  [phone]

## 2013-11-18 NOTE — Clinical Documentation Improvement (Signed)
11/18/13   Possible Clinical Conditions?    Expected Acute Blood Loss Anemia  Acute Blood Loss Anemia  Acute on chronic blood loss anemia  Chronic blood loss anemia  Precipitous drop in Hematocrit  Other Condition________________  Cannot Clinically Determine  Risk Factors: (recent surgery, pre op anemia, EBL in OR)  Supporting Information:   Per  10/30/13 MD Progress note: Normocytic Anemia  -Patient's baseline hemoglobin appears to be between 8-10  -Hemoglobin currently 7.7  -Will continue ferrous sulfate  -Will continue to monitor CBC     Thank You, Serena Colonel ,RN Clinical Documentation Specialist:  [facility] [employer]

## 2013-11-18 NOTE — Anesthesia Preprocedure Evaluation (Addendum)
Anesthesia Evaluation  Patient identified by MRN, date of birth, ID band Patient awake    Reviewed: Allergy & Precautions, H&P , NPO status , Patient's Chart, lab work & pertinent test results  Airway Mallampati: II TM Distance: >3 FB     Dental  (+) Teeth Intact, Dental Advisory Given   Pulmonary  breath sounds clear to auscultation        Cardiovascular hypertension, Rhythm:Regular Rate:Normal     Neuro/Psych    GI/Hepatic   Endo/Other    Renal/GU      Musculoskeletal   Abdominal   Peds  Hematology   Anesthesia Other Findings   Reproductive/Obstetrics                           Anesthesia Physical Anesthesia Plan  ASA: III  Anesthesia Plan: General   Post-op Pain Management:    Induction: Intravenous  Airway Management Planned: Oral ETT  Additional Equipment:   Intra-op Plan:   Post-operative Plan: Extubation in OR  Informed Consent: I have reviewed the patients History and Physical, chart, labs and discussed the procedure including the risks, benefits and alternatives for the proposed anesthesia with the patient or authorized representative who has indicated his/her understanding and acceptance.   Dental advisory given  Plan Discussed with: CRNA and Anesthesiologist  Anesthesia Plan Comments: (L. itertrochanteric femus fx Htn H/O PE on Xarelto Anemia Hgb 7.7  Plan GA with oral ETT  Roberts Gaudy, MD)       Anesthesia Quick Evaluation

## 2013-11-18 NOTE — Progress Notes (Signed)
I discussed with the patient and her son the risks and benefits of surgery for left hip fracture, including the possibility of infection, nerve injury, vessel injury, wound breakdown, arthritis, symptomatic hardware, DVT/ PE, loss of motion, and need for further surgery among others.  She understood these risks and wished to proceed.  Cathy Bellemeade, MD [facility] [phone] (p)

## 2013-11-18 NOTE — Anesthesia Postprocedure Evaluation (Signed)
  Anesthesia Post-op Note  Patient: Cathy Wallace  Procedure(s) Performed: Procedure(s): IM NAIL  LEFT HIP (Left)  Patient Location: PACU  Anesthesia Type:General  Level of Consciousness: awake, alert  and oriented  Airway and Oxygen Therapy: Patient Spontanous Breathing and Patient connected to nasal cannula oxygen  Post-op Pain: mild  Post-op Assessment: Post-op Vital signs reviewed, Patient's Cardiovascular Status Stable, Respiratory Function Stable, Patent Airway and Pain level controlled  Post-op Vital Signs: stable  Last Vitals:  Filed Vitals:   11/18/13 1115  BP: 134/44  Pulse: 53  Temp: 36.6 C  Resp: 14    Complications: No apparent anesthesia complications

## 2013-11-18 NOTE — Op Note (Signed)
[name], Cathy Wallace             ACCOUNT NO.:  [id number]  MEDICAL RECORD NO.:  [id number]  LOCATION:  6N27C                        FACILITY:  [facility]  PHYSICIAN:  Astrid Divine. Marcelino Scot, M.D. DATE OF BIRTH:  [birth year]  DATE OF PROCEDURE:  11/18/2013 DATE OF DISCHARGE:                              OPERATIVE REPORT   PREOPERATIVE DIAGNOSIS:  Left hip intertrochanteric fracture, 3 part.  POSTOPERATIVE DIAGNOSIS:  Left hip intertrochanteric fracture, 3 part.  PROCEDURE:  IM nailing left hip using a Biomet fixes 11 x 360 mm statically locked nail.  SURGEON:  Astrid Divine. Marcelino Scot, MD  ASSISTANT:  None.  ANESTHESIA:  General.  COMPLICATIONS:  None.  I/O:  1000 mL, 2 units PRBCs/UOP 175 mL.  ESTIMATED BLOOD LOSS:  100 mL.  DISPOSITION:  PACU.  CONDITION:  Stable.  BRIEF SUMMARY OF INDICATIONS FOR PROCEDURE:  [name] is a 80- year-old female, who sustained a ground level fall resulting in left hip fracture.  I discussed with her the risks and benefits of surgical repair including possibility of infection, nerve injury, vessel injury, DVT, PE, heart attack, stroke, need for further surgery, and many others including reduction and ambulatory function.  The patient and her son understood these risks and did wish to proceed.  BRIEF DESCRIPTION OF PROCEDURE:  [name] was given clindamycin preoperatively and taken to operating room where general anesthesia was induced.  Her left lower extremity was prepped and draped in usual sterile fashion.  After being moved to the [place] table and undergoing a closed reduction of her fracture using traction.  After time out, a incision was made with advancement of the curved cannulated awl to just medial to the trochanter.  The guide pin was advanced into the center- center position of the proximal femur and then the guide pin advanced all the way into the distal femur.  It was sequentially reamed and then a 11 x 360 mm nail inserted.  This  was taken to the appropriate depth, so the lag screw be placed in center-center position of the femoral head.  We obtained excellent purchase in the head despite the patient's age and after using the compression device, I used the screw proximally to allow for a telescoping compression, but without rotation.  The distal lock was placed using perfect circle technique.  All wounds were irrigated thoroughly and then closed in standard layered fashion.  I used a #1 Vicryl proximally, 2-0 Vicryl, and 3-0 nylon using simple and vertical mattress suture.  The patient was awakened from anesthesia and transported to PACU in stable condition.  We did give her 2 units of packed cells, which were started prior to the case for a blood loss anemia.  PROGNOSIS:  [name] will be weightbearing as tolerated with the assistance of physical therapy.  She will be on DVT prophylaxis, going back on her Xarelto.  Because of her age and comorbidities, she is clearly at elevated risk of perioperative complications and this has been discussed in detail with the patient and her family.  That being said, she really is exceeding well for her age and condition.     Astrid Divine. Marcelino Scot, M.D.     MHH/MEDQ  D:  11/18/2013  T:  11/18/2013  Job:  [id number]

## 2013-11-19 ENCOUNTER — Inpatient Hospital Stay (HOSPITAL_COMMUNITY): Payer: Medicare Other

## 2013-11-19 ENCOUNTER — Encounter (HOSPITAL_COMMUNITY): Payer: Self-pay | Admitting: Orthopedic Surgery

## 2013-11-19 DIAGNOSIS — F411 Generalized anxiety disorder: Secondary | ICD-10-CM

## 2013-11-19 LAB — BASIC METABOLIC PANEL
BUN: 11 mg/dL (ref 6–23)
CHLORIDE: 102 meq/L (ref 96–112)
CO2: 20 meq/L (ref 19–32)
Calcium: 8.5 mg/dL (ref 8.4–10.5)
Creatinine, Ser: 0.89 mg/dL (ref 0.50–1.10)
GFR calc Af Amer: 62 mL/min — ABNORMAL LOW (ref >90)
GFR calc non Af Amer: 54 mL/min — ABNORMAL LOW (ref >90)
Glucose, Bld: 101 mg/dL — ABNORMAL HIGH (ref 70–99)
POTASSIUM: 3.8 meq/L (ref 3.7–5.3)
Sodium: 135 mEq/L — ABNORMAL LOW (ref 137–147)

## 2013-11-19 LAB — TYPE AND SCREEN
ABO/RH(D): O POS
Antibody Screen: NEGATIVE
UNIT DIVISION: 0
Unit division: 0

## 2013-11-19 LAB — CBC
HEMATOCRIT: 27 % — AB (ref 36.0–46.0)
HEMOGLOBIN: 9 g/dL — AB (ref 12.0–15.0)
MCH: 30 pg (ref 26.0–34.0)
MCHC: 33.3 g/dL (ref 30.0–36.0)
MCV: 90 fL (ref 78.0–100.0)
Platelets: 164 10*3/uL (ref 150–400)
RBC: 3 MIL/uL — AB (ref 3.87–5.11)
RDW: 15.1 % (ref 11.5–15.5)
WBC: 9.5 10*3/uL (ref 4.0–10.5)

## 2013-11-19 LAB — PTH, INTACT AND CALCIUM
Calcium, Total (PTH): 8.1 mg/dL — ABNORMAL LOW (ref 8.4–10.5)
PTH: 100 pg/mL — AB (ref 14.0–72.0)

## 2013-11-19 MED ORDER — HYDROCODONE-ACETAMINOPHEN 5-325 MG PO TABS: 1.0000 | ORAL_TABLET | Freq: Four times a day (QID) | ORAL | Status: DC | PRN

## 2013-11-19 MED ORDER — APIXABAN 2.5 MG PO TABS
2.5000 mg | ORAL_TABLET | Freq: Two times a day (BID) | ORAL | Status: DC
  Administered 2013-11-19 – 2013-11-23 (×8): 2.5 mg via ORAL
  Filled 2013-11-19 (×9): qty 1

## 2013-11-19 MED ORDER — RIVAROXABAN 20 MG PO TABS: 20.0000 mg | ORAL_TABLET | Freq: Every day | ORAL | Status: DC

## 2013-11-19 NOTE — Progress Notes (Addendum)
[facility]                                                                              Patient Demographics  Cathy Wallace, is a 80 y.o. female, DOB - [birth year], [id number]  Admit date - 11/16/2013   Admitting Physician Berle Mull, MD  Outpatient Primary MD for the patient is GREEN, Viviann Spare, MD  LOS - 3   Chief Complaint  Patient presents with  . Fall  . Hip Pain      HPI: Cathy Wallace is a 80 y.o. female with Past medical history of right foot drop, DVT PE 2011, hypertension, CAD, pacemaker, GERD, breast cancer, recent right radius fracture. The patient presented with a mechanical fall. She has a history of right foot drop and is wearing brace. At night when she was walking with her walker, she lost her balance and fell to the ground and started having left-sided hip pain.  At time of admission, patient denied any fever, chills, headache, cough, chest pain, palpitation, shortness of breath, orthopnea, PND, nausea, vomiting, abdominal pain, diarrhea, constipation, active bleeding, burning urination, dizziness, pedal edema, focal neurological deficit.  She was recently started on Bactrim for UTI.  She was also having some blood in her urine for which she was asked to stop taking her Xarelto for few days.  Patient resides at [facility].   Assessment & Plan   Left intertrochanteric hip fracture -Left hip Xray: Displaced and angulated intertrochanteric fracture LEFT femur. -CT of the head: Left parietal scalp contusion. No acute intracranial process identified. -Orthopedic surgery consulted and performed IM nailing of the left hip -Pending PT and OT for eval and treatment  -May go to Rehab at [place], but pending PT eval  History of PE and DVT -Xarelto has been held -May restart xarelto today  Coronary artery disease -Patient currently chest pain-free -Continue Cardizem, metoprolol  Urinary tract infection -Diagnosis outpatient, patient was  placed on Bactrim for 10 days -Continue Cipro  Acute kidney injury -Mild bump in creatinine -Will hold Bactrim, will give IV fluids and continue to monitor BMP  Normocytic Anemia -Patient's baseline hemoglobin appears to be between 8-10 -Today, Hb 9.0 -Hemoglobin (11/18/13) 7.7, but repeat was 10.4 after receiving transfusion- 2uPRBCs -Will continue ferrous sulfate -Will continue to monitor CBC  Depression -Continue Zoloft  GERD -Continue PPI  Code Status: DO NOT RESUSCITATE  Family Communication: None at bedside  Disposition Plan: Admitted  Time Spent in minutes   25 minutes  Procedures  IM Nail Left hip  Consults   Orthopedic surgery  DVT Prophylaxis  Xarelto  Lab Results  Component Value Date   PLT 164 11/19/2013    Medications  Scheduled Meds: . acetaminophen  650 mg Oral BID  . antiseptic oral rinse  15 mL Mouth Rinse q12n4p  . chlorhexidine  15 mL Mouth Rinse BID  . ciprofloxacin  250 mg Oral Q breakfast  . diltiazem  180 mg Oral q morning - 10a  . ferrous sulfate  325 mg Oral Q breakfast  . heparin  5,000 Units Subcutaneous 3 times per day  . metoprolol tartrate  12.5 mg Oral BID  .  pantoprazole  40 mg Oral q morning - 10a  . sertraline  100 mg Oral Daily   Continuous Infusions: . sodium chloride 75 mL/hr at 11/18/13 1158   PRN Meds:.HYDROcodone-acetaminophen, loratadine, LORazepam, menthol-cetylpyridinium, metoCLOPramide (REGLAN) injection, metoCLOPramide, morphine injection, nitroGLYCERIN, ondansetron (ZOFRAN) IV, ondansetron, phenol  Antibiotics    Anti-infectives   Start     Dose/Rate Route Frequency Ordered Stop   11/18/13 1130  vancomycin (VANCOCIN) IVPB 1000 mg/200 mL premix     1,000 mg 200 mL/hr over 60 Minutes Intravenous Every 12 hours 11/18/13 1122 11/18/13 1330   11/18/13 0800  vancomycin (VANCOCIN) IVPB 1000 mg/200 mL premix     1,000 mg 200 mL/hr over 60 Minutes Intravenous  Once 11/17/13 0913 11/18/13 1321   11/18/13 0755   clindamycin (CLEOCIN) 900 MG/50ML IVPB    Comments:  Cathy Wallace   : cabinet override      11/18/13 0755 11/18/13 1959   11/17/13 0800  ciprofloxacin (CIPRO) tablet 250 mg    Comments:  Cipro 250 mg po Daily for UTI with CrCl < 30 mL/min   250 mg Oral Daily with breakfast 11/17/13 0026         Subjective:   Cathy Wallace seen and examined today.  Patient states she tired after her surgery and would like to take a nap.  She has no other complaints at this time.  Objective:   Filed Vitals:   11/18/13 1841 11/18/13 2226 11/19/13 0127 11/19/13 0608  BP:  152/56 108/42 125/47  Pulse: 68 74 58 59  Temp: 97.9 F (36.6 C) 99.2 F (37.3 C) 98.7 F (37.1 C) 97.7 F (36.5 C)  TempSrc: Oral Oral Oral Oral  Resp: 16 18 16 17   Height:      Weight:      SpO2: 94% 94% 92% 98%    Wt Readings from Last 3 Encounters:  11/16/13 53.9 kg (118 lb 13.3 oz)  11/16/13 53.9 kg (118 lb 13.3 oz)  10/20/13 51.256 kg (113 lb)     Intake/Output Summary (Last 24 hours) at 11/19/13 0848 Last data filed at 11/19/13 0608  Gross per 24 hour  Intake   2658 ml  Output    525 ml  Net   2133 ml    Exam  General: Well developed, well nourished, NAD, appears stated age  HEENT: NCAT, mucous membranes moist.   Neck: Supple, no JVD, no masses  Cardiovascular: S1 S2 auscultated, no rubs, murmurs or gallops. Regular rate and rhythm.  Respiratory: Clear to auscultation bilaterally with equal chest rise  Abdomen: Soft, nontender, nondistended, + bowel sounds  Extremities: warm dry without cyanosis clubbing, bandage on left hip  Neuro: AAOx3, no focal deficits  Skin: Without rashes exudates or nodules  Psych: Normal affect and demeanor with intact judgement and insight  Data Review   Micro Results Recent Results (from the past 240 hour(s))  SURGICAL PCR SCREEN     Status: None   Collection Time    11/17/13  4:21 PM      Result Value Ref Range Status   MRSA, PCR NEGATIVE  NEGATIVE Final    Staphylococcus aureus NEGATIVE  NEGATIVE Final   Comment:            The Xpert SA Assay (FDA     approved for NASAL specimens     in patients over 76 years of age),     is one component of     a comprehensive surveillance  program.  Test performance has     been validated by [facility] for patients greater     than or equal to 53 year old.     It is not intended     to diagnose infection nor to     guide or monitor treatment.    Radiology Reports Dg Chest 1 View  11/16/2013   CLINICAL DATA:  Preoperative study.  EXAM: CHEST - 1 VIEW  COMPARISON:  Chest x-ray 07/03/2013.  FINDINGS: Patient is rotated to the right which distorts cardiomediastinal contours. No consolidative airspace disease. No definite pleural effusions. No evidence of pulmonary edema. Heart size is mildly enlarged. Skin fold artifact projecting over the lateral aspect of the right hemithorax. Atherosclerosis in the thoracic aorta.  IMPRESSION: 1. No radiographic evidence of acute cardiopulmonary disease. 2. Mild cardiomegaly. 3. Atherosclerosis.   Electronically Signed   By: Vinnie Langton M.D.   On: 11/16/2013 23:38   Dg Hip Complete Left  11/16/2013   CLINICAL DATA:  LEFT anterior hip pain post fall today  EXAM: LEFT HIP - COMPLETE 2+ VIEW  COMPARISON:  Pelvic radiograph 06/14/2013  FINDINGS: Osseous demineralization.  Displaced intertrochanteric fracture LEFT femur with varus angulation.  Deformities of RIGHT superior and inferior pubic rami consistent with old fractures.  No dislocation.  No acute pelvic fracture identified.  Compression deformities of lower lumbar vertebrae.  SI joints symmetric.  Mild BILATERAL hip joint space narrowing.  Scattered atherosclerotic calcifications.  IMPRESSION: Displaced and angulated intertrochanteric fracture LEFT femur.  Old posttraumatic deformities of the RIGHT pubic rami.  Osseous demineralization.  Lower lumbar compression fractures.   Electronically Signed   By: Lavonia Dana M.D.   On: 11/16/2013 19:49   Ct Head Wo Contrast  11/16/2013   CLINICAL DATA:  Fall  EXAM: CT HEAD WITHOUT CONTRAST  TECHNIQUE: Contiguous axial images were obtained from the base of the skull through the vertex without intravenous contrast.  COMPARISON:  Prior CT from 06/28/2013  FINDINGS: Diffuse prominence of the CSF containing spaces is compatible with generalized cerebral atrophy. Scattered and confluent hypodensity within the periventricular and deep white matter of both cerebral hemispheres is consistent with moderate chronic microvascular ischemic disease.  There is no acute intracranial hemorrhage or infarct. No mass lesion or midline shift. Gray-white matter differentiation is well maintained. Ventricles are normal in size without evidence of hydrocephalus. CSF containing spaces are within normal limits. No extra-axial fluid collection.  The calvarium is intact.  Orbital soft tissues are within normal limits.  The paranasal sinuses and mastoid air cells are well pneumatized and free of fluid.  Small left parietal scalp contusion noted.  IMPRESSION: 1. Left parietal scalp contusion. No acute intracranial process identified. 2. Generalized cerebral atrophy with moderate chronic microvascular ischemic disease.   Electronically Signed   By: Jeannine Boga M.D.   On: 11/16/2013 20:05    CBC  Recent Labs Lab 11/16/13 1849 11/17/13 0532 11/17/13 1520 11/18/13 0427 11/18/13 1315 11/19/13 0550  WBC 12.5* 9.7 8.3 9.4  --  9.5  HGB 10.9* 9.6* 8.4* 7.7* 10.4* 9.0*  HCT 33.2* 29.4* 25.8* 23.9* 31.5* 27.0*  PLT 241 226 202 181  --  164  MCV 90.0 91.0 90.8 91.9  --  90.0  MCH 29.5 29.7 29.6 29.6  --  30.0  MCHC 32.8 32.7 32.6 32.2  --  33.3  RDW 14.3 14.2 14.2 14.5  --  15.1  LYMPHSABS 1.3  --   --   --   --   --  MONOABS 0.9  --   --   --   --   --   EOSABS 0.1  --   --   --   --   --   BASOSABS 0.0  --   --   --   --   --     Chemistries   Recent Labs Lab 11/16/13 1849  11/17/13 0532 11/18/13 0427 11/19/13 0550  NA 138 137 139 135*  K 3.9 3.9 3.9 3.8  CL 100 101 104 102  CO2 22 22 23 20   GLUCOSE 136* 124* 118* 101*  BUN 20 18 14 11   CREATININE 1.33* 1.19* 1.12* 0.89  CALCIUM 8.9 8.7 8.4 8.5  AST  --   --  34  --   ALT  --   --  15  --   ALKPHOS  --   --  72  --   BILITOT  --   --  0.4  --    ------------------------------------------------------------------------------------------------------------------ estimated creatinine clearance is 32 ml/min (by C-G formula based on Cr of 0.89). ------------------------------------------------------------------------------------------------------------------ No results found for this basename: HGBA1C,  in the last 72 hours ------------------------------------------------------------------------------------------------------------------ No results found for this basename: CHOL, HDL, LDLCALC, TRIG, CHOLHDL, LDLDIRECT,  in the last 72 hours ------------------------------------------------------------------------------------------------------------------ No results found for this basename: TSH, T4TOTAL, FREET3, T3FREE, THYROIDAB,  in the last 72 hours ------------------------------------------------------------------------------------------------------------------ No results found for this basename: VITAMINB12, FOLATE, FERRITIN, TIBC, IRON, RETICCTPCT,  in the last 72 hours  Coagulation profile  Recent Labs Lab 11/16/13 1849 11/17/13 0532  INR 1.83* 1.58*    No results found for this basename: DDIMER,  in the last 72 hours  Cardiac Enzymes No results found for this basename: CK, CKMB, TROPONINI, MYOGLOBIN,  in the last 168 hours ------------------------------------------------------------------------------------------------------------------ No components found with this basename: POCBNP,     Eryn Marandola D.O. on 11/19/2013 at 8:48 AM  Between 7am to 7pm - Pager - [phone]  After 7pm go to  [email or web address] - password [email or web address]  And look for the night coverage person covering for me after hours  [facility] Office  [phone]

## 2013-11-19 NOTE — Evaluation (Signed)
Physical Therapy Evaluation Patient Details Name: Cathy Wallace MRN: [id number] DOB: 06/14/1917 Today's Date: 11/19/2013   History of Present Illness  pt presents with L Femur fx s/p IM Nail.    Clinical Impression  Pt needs encouragement and mobility is limited by pain.  Pt from [place] and plans to return to [facility] at D/C.  Will continue to follow.      Follow Up Recommendations SNF    Equipment Recommendations  None recommended by PT    Recommendations for Other Services       Precautions / Restrictions Precautions Precautions: Fall Restrictions Weight Bearing Restrictions: Yes LLE Weight Bearing: Partial weight bearing LLE Partial Weight Bearing Percentage or Pounds: 50      Mobility  Bed Mobility Overal bed mobility: Needs Assistance;+2 for physical assistance Bed Mobility: Supine to Sit;Sit to Supine     Supine to sit: Max assist;+2 for physical assistance Sit to supine: Max assist;+2 for physical assistance   General bed mobility comments: pt attempts to A with bed mobility, but limited by pain.  Cues for sequencing and encouragement.    Transfers                    Ambulation/Gait                Stairs            Wheelchair Mobility    Modified Rankin (Stroke Patients Only)       Balance Overall balance assessment: Needs assistance Sitting-balance support: Single extremity supported Sitting balance-Leahy Scale: Poor Sitting balance - Comments: pt leans to R side to avoid pressure on L hip.   Postural control: Right lateral lean                                   Pertinent Vitals/Pain L hip "Terrible"  RN made aware.      Home Living Family/patient expects to be discharged to:: Skilled nursing facility                 Additional Comments: pt from [facility]    Prior Function Level of Independence: Independent with assistive device(s)          Comments: MUltiple recent falls.       Hand Dominance   Dominant Hand: Right    Extremity/Trunk Assessment   Upper Extremity Assessment: Defer to OT evaluation           Lower Extremity Assessment: Generalized weakness;LLE deficits/detail   LLE Deficits / Details: Globally weak 2/2 fx.    Cervical / Trunk Assessment: Kyphotic  Communication   Communication: No difficulties  Cognition Arousal/Alertness: Awake/alert Behavior During Therapy: WFL for tasks assessed/performed Overall Cognitive Status: Within Functional Limits for tasks assessed                      General Comments      Exercises        Assessment/Plan    PT Assessment Patient needs continued PT services  PT Diagnosis Difficulty walking;Acute pain   PT Problem List Decreased strength;Decreased activity tolerance;Decreased balance;Decreased mobility;Decreased coordination;Decreased knowledge of use of DME;Pain  PT Treatment Interventions DME instruction;Gait training;Functional mobility training;Therapeutic activities;Therapeutic exercise;Balance training;Patient/family education   PT Goals (Current goals can be found in the Care Plan section) Acute Rehab PT Goals Patient Stated Goal: Back to [facility] PT Goal Formulation:  With patient Time For Goal Achievement: 12/03/13 Potential to Achieve Goals: Fair    Frequency Min 3X/week   Barriers to discharge        Co-evaluation               End of Session   Activity Tolerance: Patient limited by pain Patient left: in bed;with call bell/phone within reach;with family/visitor present Nurse Communication: Mobility status         Time: 4128-7867 PT Time Calculation (min): 21 min   Charges:   PT Evaluation $Initial PT Evaluation Tier I: 1 Procedure PT Treatments $Therapeutic Activity: 8-22 mins   PT G CodesCatarina Hartshorn, [place] [phone] 11/19/2013, 2:29 PM

## 2013-11-19 NOTE — Progress Notes (Addendum)
ANTICOAGULATION CONSULT NOTE - Initial Consult  Pharmacy Consult for Apixaban Indication: Hx DVT/PE  Allergies  Allergen Reactions  . Aspirin Other (See Comments)    taking xarelto  . Codeine Nausea And Vomiting  . Benadryl [Diphenhydramine Hcl] Other (See Comments)    unknown  . Flu Virus Vaccine Other (See Comments)    unknown  . Penicillins Rash  . Pneumococcal Vaccines Other (See Comments)    unknown    Patient Measurements: Height: 5\' 3"  (160 cm) Weight: 118 lb 13.3 oz (53.9 kg) IBW/kg (Calculated) : 52.4  Vital Signs: Temp: 98.4 F (36.9 C) (05/22 0925) Temp src: Oral (05/22 0925) BP: 117/44 mmHg (05/22 0925) Pulse Rate: 76 (05/22 0925)  Labs:  Recent Labs  11/16/13 1849 11/17/13 0532 11/17/13 1520 11/18/13 0427 11/18/13 1315 11/19/13 0550  HGB 10.9* 9.6* 8.4* 7.7* 10.4* 9.0*  HCT 33.2* 29.4* 25.8* 23.9* 31.5* 27.0*  PLT 241 226 202 181  --  164  APTT  --  36  --   --   --   --   LABPROT 20.6* 18.4*  --   --   --   --   INR 1.83* 1.58*  --   --   --   --   CREATININE 1.33* 1.19*  --  1.12*  --  0.89    Estimated Creatinine Clearance: 32 ml/min (by C-G formula based on Cr of 0.89).   Medical History: Past Medical History  Diagnosis Date  . DVT (deep venous thrombosis) 2011  . Pulmonary embolism Jan 2012  . HTN (hypertension)   . Spinal stenosis   . Bradycardia, on admit 08/09/2012  . Acute MI 08/10/2012    2D Echo - EF 55-60%, normal  . Pacemaker   . Depression   . GERD (gastroesophageal reflux disease)   . Cancer     hx of breast cancer  . Arthritis   . Closed pelvic fracture 11/11/2012  . Alcohol dependence 11/15/2012  . Long term (current) use of anticoagulants 11/15/2012  . Sacral fracture, closed 11/15/2012    Bilateral nondisplaced sacral fractures.   . L5 vertebral fracture 11/15/2012    Fracture of the L5 left transverse process.    . Anemia 11/15/2012  . B12 deficiency 12/01/2012  . Insomnia 12/01/2012  . Unspecified vitamin D deficiency  12/07/2012  . Anxiety   . Osteoarthritis 02/10/2013  . Lumbar vertebral fracture 06/15/2013    L3-4 12/15/20143  . Anginal pain   . Hyperlipemia     Assessment: 89 YOF on Xarelto PTA for hx PE/DVT noted to be held a couple of days before admit due to hematuria. The patient was then admitted with a fall on 5/19 requiring L-IM nailing on 5/21. Plans are now to resume anticoagulation post-op.  The patient was discussed with Dr. Ree Kida. Given the patient's advanced age, weight, and renal function - it was decided that Apixaban would be a better option for this patient. Since the clots are older (2011-2012) - we will plan to go ahead and start on a lower prevention dose of Apixaban of 2.5 mg twice daily.   The patient and caregiver were educated today.  Goal of Therapy:  Appropriate anticoagulation   Plan:  1. Initiate apixaban 2.5 mg twice daily (first dose this evening) 2. Will plan to educate on apixaban prior to discharge 3. Will sign off of protocol but will monitor the patient peripherally for any s/sx of bleeding  Alycia Rossetti, PharmD, BCPS Clinical Pharmacist Pager: [phone] 11/19/2013  11:21 AM

## 2013-11-19 NOTE — Discharge Instructions (Addendum)
Orthopaedic Trauma Service Discharge Instructions   General Discharge Instructions  WEIGHT BEARING STATUS: Weightbearing as tolerated   RANGE OF MOTION/ACTIVITY: Range of motion and activity as tolerated   Wound Care: daily dry dressing changes as needed, see detailed instructions below   Diet: as you were eating previously.  Can use over the counter stool softeners and bowel preparations, such as Miralax, to help with bowel movements.  Narcotics can be constipating.  Be sure to drink plenty of fluids  STOP SMOKING OR USING NICOTINE PRODUCTS!!!!  As discussed nicotine severely impairs your body's ability to heal surgical and traumatic wounds but also impairs bone healing.  Wounds and bone heal by forming microscopic blood vessels (angiogenesis) and nicotine is a vasoconstrictor (essentially, shrinks blood vessels).  Therefore, if vasoconstriction occurs to these microscopic blood vessels they essentially disappear and are unable to deliver necessary nutrients to the healing tissue.  This is one modifiable factor that you can do to dramatically increase your chances of healing your injury.    (This means no smoking, no nicotine gum, patches, etc)  DO NOT USE NONSTEROIDAL ANTI-INFLAMMATORY DRUGS (NSAID'S)  Using products such as Advil (ibuprofen), Aleve (naproxen), Motrin (ibuprofen) for additional pain control during fracture healing can delay and/or prevent the healing response.  If you would like to take over the counter (OTC) medication, Tylenol (acetaminophen) is ok.  However, some narcotic medications that are given for pain control contain acetaminophen as well. Therefore, you should not exceed more than 4000 mg of tylenol in a day if you do not have liver disease.  Also note that there are may OTC medicines, such as cold medicines and allergy medicines that my contain tylenol as well.  If you have any questions about medications and/or interactions please ask your doctor/PA or your  pharmacist.   PAIN MEDICATION USE AND EXPECTATIONS  You have likely been given narcotic medications to help control your pain.  After a traumatic event that results in an fracture (broken bone) with or without surgery, it is ok to use narcotic pain medications to help control one's pain.  We understand that everyone responds to pain differently and each individual patient will be evaluated on a regular basis for the continued need for narcotic medications. Ideally, narcotic medication use should last no more than 6-8 weeks (coinciding with fracture healing).   As a patient it is your responsibility as well to monitor narcotic medication use and report the amount and frequency you use these medications when you come to your office visit.   We would also advise that if you are using narcotic medications, you should take a dose prior to therapy to maximize you participation.  IF YOU ARE ON NARCOTIC MEDICATIONS IT IS NOT PERMISSIBLE TO OPERATE A MOTOR VEHICLE (MOTORCYCLE/CAR/TRUCK/MOPED) OR HEAVY MACHINERY DO NOT MIX NARCOTICS WITH OTHER CNS (CENTRAL NERVOUS SYSTEM) DEPRESSANTS SUCH AS ALCOHOL       ICE AND ELEVATE INJURED/OPERATIVE EXTREMITY  Using ice and elevating the injured extremity above your heart can help with swelling and pain control.  Icing in a pulsatile fashion, such as 20 minutes on and 20 minutes off, can be followed.    Do not place ice directly on skin. Make sure there is a barrier between to skin and the ice pack.    Using frozen items such as frozen peas works well as the conform nicely to the are that needs to be iced.  USE AN ACE WRAP OR TED HOSE FOR SWELLING CONTROL  In  addition to icing and elevation, Ace wraps or TED hose are used to help limit and resolve swelling.  It is recommended to use Ace wraps or TED hose until you are informed to stop.    When using Ace Wraps start the wrapping distally (farthest away from the body) and wrap proximally (closer to the  body)   Example: If you had surgery on your leg or thing and you do not have a splint on, start the ace wrap at the toes and work your way up to the thigh        If you had surgery on your upper extremity and do not have a splint on, start the ace wrap at your fingers and work your way up to the upper arm  IF YOU ARE IN A SPLINT OR CAST DO NOT [place]   If your splint gets wet for any reason please contact the office immediately. You may shower in your splint or cast as long as you keep it dry.  This can be done by wrapping in a cast cover or garbage back (or similar)  Do Not stick any thing down your splint or cast such as pencils, money, or hangers to try and scratch yourself with.  If you feel itchy take benadryl as prescribed on the bottle for itching  IF YOU ARE IN A CAM BOOT (BLACK BOOT)  You may remove boot periodically. Perform daily dressing changes as noted below.  Wash the liner of the boot regularly and wear a sock when wearing the boot. It is recommended that you sleep in the boot until told otherwise  CALL THE OFFICE WITH ANY QUESTIONS OR CONCERTS: [phone]     Discharge Pin Site Instructions  Dress pins daily with Kerlix roll starting on POD 2. Wrap the Kerlix so that it tamps the skin down around the pin-skin interface to prevent/limit motion of the skin relative to the pin.  (Pin-skin motion is the primary cause of pain and infection related to external fixator pin sites).  Remove any crust or coagulum that may obstruct drainage with a saline moistened gauze or soap and water.  After POD 3, if there is no discernable drainage on the pin site dressing, the interval for change can by increased to every other day.  You may shower with the fixator, cleaning all pin sites gently with soap and water.  If you have a surgical wound this needs to be completely dry and without drainage before showering.  The extremity can be lifted by the fixator to facilitate  wound care and transfers.  Notify the office/Doctor if you experience increasing drainage, redness, or pain from a pin site, or if you notice purulent (thick, snot-like) drainage.  Discharge Wound Care Instructions  Do NOT apply any ointments, solutions or lotions to pin sites or surgical wounds.  These prevent needed drainage and even though solutions like hydrogen peroxide kill bacteria, they also damage cells lining the pin sites that help fight infection.  Applying lotions or ointments can keep the wounds moist and can cause them to breakdown and open up as well. This can increase the risk for infection. When in doubt call the office.  Surgical incisions should be dressed daily.  If any drainage is noted, use one layer of adaptic, then gauze, Kerlix, and an ace wrap.  Once the incision is completely dry and without drainage, it may be left open to air out.  Showering may begin 36-48 hours  later.  Cleaning gently with soap and water.  Traumatic wounds should be dressed daily as well.    One layer of adaptic, gauze, Kerlix, then ace wrap.  The adaptic can be discontinued once the draining has ceased    If you have a wet to dry dressing: wet the gauze with saline the squeeze as much saline out so the gauze is moist (not soaking wet), place moistened gauze over wound, then place a dry gauze over the moist one, followed by Kerlix wrap, then ace wrap.   Information on my medicine - ELIQUIS (apixaban)  This medication education was reviewed with me or my healthcare representative as part of my discharge preparation.  The pharmacist that spoke with me during my hospital stay was:  Rolla Flatten, [facility]  Why was Eliquis prescribed for you? Eliquis was prescribed for you to reduce the risk of forming blood clots   What do You need to know about Eliquis ? Take your Eliquis TWICE DAILY - one tablet in the morning and one tablet in the evening with or without food.  It would be best to take  the doses about the same time each day.  If you have difficulty swallowing the tablet whole please discuss with your pharmacist how to take the medication safely.  Take Eliquis exactly as prescribed by your doctor and DO NOT stop taking Eliquis without talking to the doctor who prescribed the medication.  Stopping may increase your risk of developing a new clot.  Refill your prescription before you run out.  After discharge, you should have regular check-up appointments with your healthcare provider that is prescribing your Eliquis.  In the future your dose may need to be changed if your kidney function or weight changes by a significant amount or as you get older.  What do you do if you miss a dose? If you miss a dose, take it as soon as you remember on the same day and resume taking twice daily.  Do not take more than one dose of ELIQUIS at the same time.  Important Safety Information A possible side effect of Eliquis is bleeding. You should call your healthcare provider right away if you experience any of the following:   Bleeding from an injury or your nose that does not stop.   Unusual colored urine (red or dark brown) or unusual colored stools (red or black).   Unusual bruising for unknown reasons.   A serious fall or if you hit your head (even if there is no bleeding).  Some medicines may interact with Eliquis and might increase your risk of bleeding or clotting while on Eliquis. To help avoid this, consult your healthcare provider or pharmacist prior to using any new prescription or non-prescription medications, including herbals, vitamins, non-steroidal anti-inflammatory drugs (NSAIDs) and supplements.  This website has more information on Eliquis (apixaban): [email or web address].no.

## 2013-11-19 NOTE — Progress Notes (Signed)
Orthopaedic Trauma Service Progress Note  Subjective  Doing quite well this am L hip is sore Not much pain with lying still but increased pain with movement No other complaints or concerns  Eager to get to [facility] rehab unit     Objective   BP 117/44  Pulse 76  Temp(Src) 98.4 F (36.9 C) (Oral)  Resp 22  Ht 5\' 3"  (1.6 m)  Wt 53.9 kg (118 lb 13.3 oz)  BMI 21.05 kg/m2  SpO2 97%  Intake/Output     05/21 0701 - 05/22 0700 05/22 0701 - 05/23 0700   P.O.     I.V. (mL/kg) 2367 (43.9)    Blood 582    Total Intake(mL/kg) 2949 (54.7)    Urine (mL/kg/hr) 475 (0.4)    Blood 50 (0)    Total Output 525     Net +2424            Labs  Results for MAELI, SPACEK (MRN [id number]) as of 11/19/2013 10:36  Ref. Range 11/19/2013 05:50  Sodium Latest Range: 137-147 mEq/L 135 (L)  Potassium Latest Range: 3.7-5.3 mEq/L 3.8  Chloride Latest Range: 96-112 mEq/L 102  CO2 Latest Range: 19-32 mEq/L 20  BUN Latest Range: 6-23 mg/dL 11  Creatinine Latest Range: 0.50-1.10 mg/dL 0.89  Calcium Latest Range: 8.4-10.5 mg/dL 8.5  GFR calc non Af Amer Latest Range: >90 mL/min 54 (L)  GFR calc Af Amer Latest Range: >90 mL/min 62 (L)  Glucose Latest Range: 70-99 mg/dL 101 (H)  WBC No range found 9.5  RBC Latest Range: 3.87-5.11 MIL/uL 3.00 (L)  Hemoglobin Latest Range: 12.0-15.0 g/dL 9.0 (L)  HCT Latest Range: 36.0-46.0 % 27.0 (L)  MCV Latest Range: 78.0-100.0 fL 90.0  MCH Latest Range: 26.0-34.0 pg 30.0  MCHC Latest Range: 30.0-36.0 g/dL 33.3  RDW Latest Range: 11.5-15.5 % 15.1  Platelets Latest Range: 150-400 K/uL 164    Exam  Gen: awake and alert, very pleasant, appears comfortable  Lungs: clear anterior fields Cardiac: s1 and s2  Abd: soft, NTND, + BS Ext:       Left Lower Extremity   Dressings stable  Drainage on distal dressing  Distal motor and sensory functions grossly intact  Ext warm  + DP pulse    Assessment and Plan   POD/HD#:   80 y/o female s/p fall with  L intertroch hip fracture   1. Fall  2. L intertrochanteric femur fx s/p IMN  WBAT L leg  No ROM restrictions  PT/OT evals  Ice and elevate  Dressing changes as needed starting 11/20/2013  3. Medical issues per primary service   4. Pain control   Tylenol   Minimize narcotics   5. DVT/PE prophylaxis  Has been on chronic xarelto therapy for hx of DVT  Will restart xarelto   Continue with SCDs  Will have TED placed to L leg   6. ABL anemia  Related to fx  Received 2 units of PRBC's in OR yesterday  Continue to monitor   Check cbc in am   7. Dispo  PT/OT evals  Suspect will be ready for SNF on Monday     Jari Pigg, PA-C [facility] [phone] (P) 11/19/2013 10:35 AM  **Disclaimer: This note may have been dictated with voice recognition software. Similar sounding words can inadvertently be transcribed and this note may contain transcription errors which may not have been corrected upon publication of note.**

## 2013-11-19 NOTE — Evaluation (Addendum)
Occupational Therapy Evaluation Patient Details Name: Cathy Wallace MRN: [id number] DOB: Jan 27, 1917 Today's Date: 11/19/2013    History of Present Illness pt presents with L Femur fx s/p IM Nail.     Clinical Impression   Pt admitted with left femur fx, s/p IM nail.  Pt is very limited by pain (only performing bed mobility and ADLs at bed level with OT).  Pt is from [place] and plans to d/c to [facility]F.  Will continue to follow acutely.    Follow Up Recommendations  SNF;Supervision/Assistance - 24 hour    Equipment Recommendations   (defer to SNF)    Recommendations for Other Services       Precautions / Restrictions Precautions Precautions: Fall Restrictions Weight Bearing Restrictions: Yes LLE Weight Bearing: Weight bearing as tolerated LLE Partial Weight Bearing Percentage or Pounds: 50      Mobility Bed Mobility Overal bed mobility: Needs Assistance Bed Mobility: Rolling Rolling: Max assist (to right side)      General bed mobility comments: pt attempts to A with bed mobility, but limited by pain.  Cues for sequencing and encouragement.    Transfers                      Balance                                  ADL Overall ADL's : Needs assistance/impaired Eating/Feeding: Modified independent;Bed level   Grooming: Brushing hair;Set up;Bed level   Upper Body Bathing: Supervision/ safety;Bed level   Lower Body Bathing: +2 for physical assistance;Maximal assistance;Bed level   Upper Body Dressing : Minimal assistance;Bed level   Lower Body Dressing: +2 for physical assistance;Bed level;Total assistance                 General ADL Comments: Pt coming into long sitting in bed to reach her purse.  Pt rolled with max assist onto left hip (refusing to sit EOB with OT).      Vision                     Perception     Praxis      Pertinent Vitals/Pain See vitals tab     Hand  Dominance Right   Extremity/Trunk Assessment Upper Extremity Assessment Upper Extremity Assessment: Generalized weakness   Lower Extremity Assessment Lower Extremity Assessment: Generalized weakness;LLE deficits/detail LLE Deficits / Details: Globally weak 2/2 fx.   LLE Coordination: decreased fine motor;decreased gross motor   Cervical / Trunk Assessment Cervical / Trunk Assessment: Kyphotic   Communication Communication Communication: HOH   Cognition Arousal/Alertness: Awake/alert Behavior During Therapy: WFL for tasks assessed/performed Overall Cognitive Status: Within Functional Limits for tasks assessed                     General Comments       Exercises       Shoulder Instructions      Home Living Family/patient expects to be discharged to:: Skilled nursing facility                                 Additional Comments: pt from [place]      Prior Functioning/Environment Level of Independence: Independent with assistive device(s)        Comments: MUltiple recent falls.  OT Diagnosis: Generalized weakness;Acute pain   OT Problem List: Decreased strength;Decreased activity tolerance;Impaired balance (sitting and/or standing);Decreased knowledge of use of DME or AE;Decreased knowledge of precautions;Pain   OT Treatment/Interventions: Self-care/ADL training;DME and/or AE instruction;Therapeutic activities;Patient/family education;Balance training    OT Goals(Current goals can be found in the care plan section) Acute Rehab OT Goals Patient Stated Goal: Back to [facility] OT Goal Formulation: With patient Time For Goal Achievement: 11/26/13 Potential to Achieve Goals: Good  OT Frequency: Min 2X/week   Barriers to D/C:            Co-evaluation              End of Session    Activity Tolerance: Patient limited by pain Patient left: in bed;with call bell/phone within reach   Time: 1257-1312 OT Time  Calculation (min): 15 min Charges:  OT General Charges $OT Visit: 1 Procedure OT Evaluation $Initial OT Evaluation Tier I: 1 Procedure OT Treatments $Self Care/Home Management : 8-22 mins G-Codes:    Luther Bradley 12/12/2013, 3:32 PM 12-Dec-2013 Luther Bradley OTR/L Pager [phone] Office [phone]

## 2013-11-19 NOTE — Care Management Note (Signed)
    Page 1 of 1   11/19/2013     10:47:51 AM CARE MANAGEMENT NOTE 11/19/2013  Patient:  MARCI, POLITO   Account Number:  [id number]  Date Initiated:  11/19/2013  Documentation initiated by:  Magdalen Spatz  Subjective/Objective Assessment:     Action/Plan:   Anticipated DC Date:  11/22/2013   Anticipated DC Plan:  SKILLED NURSING FACILITY  In-house referral  Clinical Social Worker         Choice offered to / List presented to:             Status of service:   Medicare Important Message given?   (If response is "NO", the following Medicare IM given date fields will be blank) Date Medicare IM given:   Date Additional Medicare IM given:    Discharge Disposition:    Per UR Regulation:    If discussed at Long Length of Stay Meetings, dates discussed:    Comments:  11-19-13 Patient wanting rehab at [place] , [place] aware . Magdalen Spatz RN BSN

## 2013-11-20 DIAGNOSIS — I442 Atrioventricular block, complete: Secondary | ICD-10-CM

## 2013-11-20 DIAGNOSIS — S52599A Other fractures of lower end of unspecified radius, initial encounter for closed fracture: Secondary | ICD-10-CM

## 2013-11-20 LAB — CBC
HCT: 28.1 % — ABNORMAL LOW (ref 36.0–46.0)
Hemoglobin: 9.3 g/dL — ABNORMAL LOW (ref 12.0–15.0)
MCH: 29.8 pg (ref 26.0–34.0)
MCHC: 33.1 g/dL (ref 30.0–36.0)
MCV: 90.1 fL (ref 78.0–100.0)
Platelets: 190 10*3/uL (ref 150–400)
RBC: 3.12 MIL/uL — ABNORMAL LOW (ref 3.87–5.11)
RDW: 14.9 % (ref 11.5–15.5)
WBC: 9.2 10*3/uL (ref 4.0–10.5)

## 2013-11-20 LAB — BASIC METABOLIC PANEL
BUN: 13 mg/dL (ref 6–23)
CHLORIDE: 105 meq/L (ref 96–112)
CO2: 23 mEq/L (ref 19–32)
Calcium: 8.5 mg/dL (ref 8.4–10.5)
Creatinine, Ser: 0.98 mg/dL (ref 0.50–1.10)
GFR calc non Af Amer: 48 mL/min — ABNORMAL LOW (ref >90)
GFR, EST AFRICAN AMERICAN: 55 mL/min — AB (ref >90)
Glucose, Bld: 104 mg/dL — ABNORMAL HIGH (ref 70–99)
POTASSIUM: 4 meq/L (ref 3.7–5.3)
SODIUM: 138 meq/L (ref 137–147)

## 2013-11-20 NOTE — Progress Notes (Signed)
Subjective: 2 Days Post-Op Procedure(s) (LRB): IM NAIL  LEFT HIP (Left) Patient reports pain as 2 on 0-10 scale.   Patient is doing much better today.  Mobility is more limited by equinus deformity of her right foot due to chronic drop foot secondary to severe spinal stenosis.  Objective: Vital signs in last 24 hours: Temp:  [98.3 F (36.8 C)-99.1 F (37.3 C)] 99.1 F (37.3 C) (05/23 1310) Pulse Rate:  [60-69] 60 (05/23 1310) Resp:  [18-20] 20 (05/23 1310) BP: (111-154)/(47-74) 130/47 mmHg (05/23 1310) SpO2:  [92 %-97 %] 92 % (05/23 1310)  Intake/Output from previous day: 05/22 0701 - 05/23 0700 In: 240 [P.O.:240] Out: 750 [Urine:750] Intake/Output this shift: Total I/O In: -  Out: 100 [Urine:100]   Recent Labs  11/18/13 0427 11/18/13 1315 11/19/13 0550 11/20/13 0451  HGB 7.7* 10.4* 9.0* 9.3*    Recent Labs  11/19/13 0550 11/20/13 0451  WBC 9.5 9.2  RBC 3.00* 3.12*  HCT 27.0* 28.1*  PLT 164 190    Recent Labs  11/19/13 0550 11/20/13 0451  NA 135* 138  K 3.8 4.0  CL 102 105  CO2 20 23  BUN 11 13  CREATININE 0.89 0.98  GLUCOSE 101* 104*  CALCIUM 8.5 8.5   No results found for this basename: LABPT, INR,  in the last 72 hours  ABD soft Neurovascular intact Sensation intact distally Intact pulses distally Dorsiflexion/Plantar flexion intact Incision: scant drainage  Assessment/Plan: 2 Days Post-Op Procedure(s) (LRB): IM NAIL  LEFT HIP (Left) Advance diet Up with therapy D/C IV fluids Discharge to SNF on Monday or Tuesday Ordered a PRAFO to use when she is in bed or chair.  Son is bring her shoes that have her AFO in them to assist with ambulation.  Tiny Rietz J Abad Manard 11/20/2013, 4:37 PM

## 2013-11-20 NOTE — Progress Notes (Signed)
Orthopedic Tech Progress Note Patient Details:  Cathy Wallace 1916-09-12 [id number] Prafo boot applied to RLE Ortho Devices Type of Ortho Device: Postop shoe/boot;Other (comment) Ortho Device/Splint Location: RLE Ortho Device/Splint Interventions: Application   [name] 11/20/2013, 4:45 PM

## 2013-11-20 NOTE — Progress Notes (Signed)
[facility]                                                                              Patient Demographics  Cathy Wallace, is a 80 y.o. female, DOB - 1917/04/10, UUV:[id number]  Admit date - 11/16/2013   Admitting Physician Berle Mull, MD  Outpatient Primary MD for the patient is GREEN, Cathy Spare, MD  LOS - 4   Chief Complaint  Patient presents with  . Fall  . Hip Pain      HPI: Cathy Wallace is a 80 y.o. female with Past medical history of right foot drop, DVT PE 2011, hypertension, CAD, pacemaker, GERD, breast cancer, recent right radius fracture. The patient presented with a mechanical fall. She has a history of right foot drop and is wearing brace. At night when she was walking with her walker, she lost her balance and fell to the ground and started having left-sided hip pain.  At time of admission, patient denied any fever, chills, headache, cough, chest pain, palpitation, shortness of breath, orthopnea, PND, nausea, vomiting, abdominal pain, diarrhea, constipation, active bleeding, burning urination, dizziness, pedal edema, focal neurological deficit.  She was recently started on Bactrim for UTI.  She was also having some blood in her urine for which she was asked to stop taking her Xarelto for few days.  Patient resides at [facility].   Assessment & Plan   Left intertrochanteric hip fracture -Left hip Xray: Displaced and angulated intertrochanteric fracture LEFT femur. -CT of the head: Left parietal scalp contusion. No acute intracranial process identified. -Orthopedic surgery consulted and performed IM nailing of the left hip -PT and OT recommended SNF -May go to Rehab at [place] of PE and DVT -Patient was on Xarelto as an outpatient, however given her age as well as renal function, patient was switched to Eliquis.  Coronary artery disease -Patient currently chest pain-free -Continue Cardizem, metoprolol  Urinary tract  infection -Diagnosis outpatient, patient was placed on Bactrim for 10 days -Continue Cipro  Acute kidney injury -Mild bump in creatinine -Will hold Bactrim, will give IV fluids and continue to monitor BMP  Normocytic Anemia -Patient's baseline hemoglobin appears to be between 8-10 -Today, Hb 9.3 -Hemoglobin (11/18/13) 7.7, but repeat was 10.4 after receiving transfusion- 2uPRBCs -Will continue ferrous sulfate -Will continue to monitor CBC  Depression -Continue Zoloft  GERD -Continue PPI  Code Status: DO NOT RESUSCITATE  Family Communication: Caregiver at bedside.  Disposition Plan: Admitted, likely discharge to [facility] on 11/22/2013  Time Spent in minutes   25 minutes  Procedures  IM Nail Left hip  Consults   Orthopedic surgery  DVT Prophylaxis  Xarelto  Lab Results  Component Value Date   PLT 190 11/20/2013    Medications  Scheduled Meds: . acetaminophen  650 mg Oral BID  . antiseptic oral rinse  15 mL Mouth Rinse q12n4p  . apixaban  2.5 mg Oral BID  . chlorhexidine  15 mL Mouth Rinse BID  . ciprofloxacin  250 mg Oral Q breakfast  . diltiazem  180 mg Oral q morning - 10a  . ferrous sulfate  325 mg Oral Q breakfast  . metoprolol tartrate  12.5 mg Oral BID  . pantoprazole  40 mg Oral q morning - 10a  . sertraline  100 mg Oral Daily   Continuous Infusions: . sodium chloride 75 mL/hr at 11/18/13 1158   PRN Meds:.HYDROcodone-acetaminophen, loratadine, LORazepam, menthol-cetylpyridinium, metoCLOPramide (REGLAN) injection, metoCLOPramide, morphine injection, nitroGLYCERIN, ondansetron (ZOFRAN) IV, ondansetron, phenol  Antibiotics    Anti-infectives   Start     Dose/Rate Route Frequency Ordered Stop   11/18/13 1130  vancomycin (VANCOCIN) IVPB 1000 mg/200 mL premix     1,000 mg 200 mL/hr over 60 Minutes Intravenous Every 12 hours 11/18/13 1122 11/18/13 1330   11/18/13 0800  vancomycin (VANCOCIN) IVPB 1000 mg/200 mL premix     1,000 mg 200 mL/hr over 60  Minutes Intravenous  Once 11/17/13 0913 11/18/13 1321   11/18/13 0755  clindamycin (CLEOCIN) 900 MG/50ML IVPB    Comments:  [name]   : cabinet override      11/18/13 0755 11/18/13 1959   11/17/13 0800  ciprofloxacin (CIPRO) tablet 250 mg    Comments:  Cipro 250 mg po Daily for UTI with CrCl < 30 mL/min   250 mg Oral Daily with breakfast 11/17/13 0026         Subjective:   Cathy Wallace seen and examined today.  Patient she is feeling much better today. She is looking forward to returning to [facility]. Currently denies any chest pain, shortness of breath, abdominal pain.  Objective:   Filed Vitals:   11/19/13 2200 11/20/13 0000 11/20/13 0500 11/20/13 0800  BP: 111/69  154/74   Pulse: 69  60   Temp: 98.6 F (37 C)  98.3 F (36.8 C)   TempSrc: Oral  Oral   Resp: 20 20 20 18   Height:      Weight:      SpO2: 96% 96% 92% 94%    Wt Readings from Last 3 Encounters:  11/16/13 53.9 kg (118 lb 13.3 oz)  11/16/13 53.9 kg (118 lb 13.3 oz)  10/20/13 51.256 kg (113 lb)     Intake/Output Summary (Last 24 hours) at 11/20/13 0957 Last data filed at 11/20/13 0500  Gross per 24 hour  Intake    120 ml  Output    750 ml  Net   -630 ml    Exam  General: Well developed, well nourished, NAD, appears stated age  HEENT: NCAT, mucous membranes moist.   Neck: Supple, no JVD, no masses  Cardiovascular: S1 S2 auscultated, no rubs, murmurs or gallops. Regular rate and rhythm.  Respiratory: Clear to auscultation bilaterally with equal chest rise  Abdomen: Soft, nontender, nondistended, + bowel sounds  Extremities: warm dry without cyanosis clubbing, bandage on left hip  Neuro: AAOx3, no focal deficits  Skin: Without rashes exudates or nodules  Psych: Normal affect and demeanor with intact judgement and insight  Data Review   Micro Results Recent Results (from the past 240 hour(s))  SURGICAL PCR SCREEN     Status: None   Collection Time    11/17/13  4:21 PM       Result Value Ref Range Status   MRSA, PCR NEGATIVE  NEGATIVE Final   Staphylococcus aureus NEGATIVE  NEGATIVE Final   Comment:            The Xpert SA Assay (FDA     approved for NASAL specimens     in patients over 80 years of age),     is one component of     a comprehensive surveillance  program.  Test performance has     been validated by [facility] for patients greater     than or equal to 5 year old.     It is not intended     to diagnose infection nor to     guide or monitor treatment.    Radiology Reports Dg Chest 1 View  11/16/2013   CLINICAL DATA:  Preoperative study.  EXAM: CHEST - 1 VIEW  COMPARISON:  Chest x-ray 07/03/2013.  FINDINGS: Patient is rotated to the right which distorts cardiomediastinal contours. No consolidative airspace disease. No definite pleural effusions. No evidence of pulmonary edema. Heart size is mildly enlarged. Skin fold artifact projecting over the lateral aspect of the right hemithorax. Atherosclerosis in the thoracic aorta.  IMPRESSION: 1. No radiographic evidence of acute cardiopulmonary disease. 2. Mild cardiomegaly. 3. Atherosclerosis.   Electronically Signed   By: Vinnie Langton M.D.   On: 11/16/2013 23:38   Dg Hip Complete Left  11/16/2013   CLINICAL DATA:  LEFT anterior hip pain post fall today  EXAM: LEFT HIP - COMPLETE 2+ VIEW  COMPARISON:  Pelvic radiograph 06/14/2013  FINDINGS: Osseous demineralization.  Displaced intertrochanteric fracture LEFT femur with varus angulation.  Deformities of RIGHT superior and inferior pubic rami consistent with old fractures.  No dislocation.  No acute pelvic fracture identified.  Compression deformities of lower lumbar vertebrae.  SI joints symmetric.  Mild BILATERAL hip joint space narrowing.  Scattered atherosclerotic calcifications.  IMPRESSION: Displaced and angulated intertrochanteric fracture LEFT femur.  Old posttraumatic deformities of the RIGHT pubic rami.  Osseous demineralization.  Lower  lumbar compression fractures.   Electronically Signed   By: Lavonia Dana M.D.   On: 11/16/2013 19:49   Ct Head Wo Contrast  11/16/2013   CLINICAL DATA:  Fall  EXAM: CT HEAD WITHOUT CONTRAST  TECHNIQUE: Contiguous axial images were obtained from the base of the skull through the vertex without intravenous contrast.  COMPARISON:  Prior CT from 06/28/2013  FINDINGS: Diffuse prominence of the CSF containing spaces is compatible with generalized cerebral atrophy. Scattered and confluent hypodensity within the periventricular and deep white matter of both cerebral hemispheres is consistent with moderate chronic microvascular ischemic disease.  There is no acute intracranial hemorrhage or infarct. No mass lesion or midline shift. Gray-white matter differentiation is well maintained. Ventricles are normal in size without evidence of hydrocephalus. CSF containing spaces are within normal limits. No extra-axial fluid collection.  The calvarium is intact.  Orbital soft tissues are within normal limits.  The paranasal sinuses and mastoid air cells are well pneumatized and free of fluid.  Small left parietal scalp contusion noted.  IMPRESSION: 1. Left parietal scalp contusion. No acute intracranial process identified. 2. Generalized cerebral atrophy with moderate chronic microvascular ischemic disease.   Electronically Signed   By: Jeannine Boga M.D.   On: 11/16/2013 20:05    CBC  Recent Labs Lab 11/16/13 1849 11/17/13 0532 11/17/13 1520 11/18/13 0427 11/18/13 1315 11/19/13 0550 11/20/13 0451  WBC 12.5* 9.7 8.3 9.4  --  9.5 9.2  HGB 10.9* 9.6* 8.4* 7.7* 10.4* 9.0* 9.3*  HCT 33.2* 29.4* 25.8* 23.9* 31.5* 27.0* 28.1*  PLT 241 226 202 181  --  164 190  MCV 90.0 91.0 90.8 91.9  --  90.0 90.1  MCH 29.5 29.7 29.6 29.6  --  30.0 29.8  MCHC 32.8 32.7 32.6 32.2  --  33.3 33.1  RDW 14.3 14.2 14.2 14.5  --  15.1 14.9  LYMPHSABS 1.3  --   --   --   --   --   --   MONOABS 0.9  --   --   --   --   --   --    EOSABS 0.1  --   --   --   --   --   --   BASOSABS 0.0  --   --   --   --   --   --     Chemistries   Recent Labs Lab 11/16/13 1849 11/17/13 0532 11/18/13 0427 11/19/13 0550 11/20/13 0451  NA 138 137 139 135* 138  K 3.9 3.9 3.9 3.8 4.0  CL 100 101 104 102 105  CO2 22 22 23 20 23   GLUCOSE 136* 124* 118* 101* 104*  BUN 20 18 14 11 13   CREATININE 1.33* 1.19* 1.12* 0.89 0.98  CALCIUM 8.9 8.7 8.4  8.1* 8.5 8.5  AST  --   --  34  --   --   ALT  --   --  15  --   --   ALKPHOS  --   --  72  --   --   BILITOT  --   --  0.4  --   --    ------------------------------------------------------------------------------------------------------------------ estimated creatinine clearance is 29 ml/min (by C-G formula based on Cr of 0.98). ------------------------------------------------------------------------------------------------------------------ No results found for this basename: HGBA1C,  in the last 72 hours ------------------------------------------------------------------------------------------------------------------ No results found for this basename: CHOL, HDL, LDLCALC, TRIG, CHOLHDL, LDLDIRECT,  in the last 72 hours ------------------------------------------------------------------------------------------------------------------ No results found for this basename: TSH, T4TOTAL, FREET3, T3FREE, THYROIDAB,  in the last 72 hours ------------------------------------------------------------------------------------------------------------------ No results found for this basename: VITAMINB12, FOLATE, FERRITIN, TIBC, IRON, RETICCTPCT,  in the last 72 hours  Coagulation profile  Recent Labs Lab 11/16/13 1849 11/17/13 0532  INR 1.83* 1.58*    No results found for this basename: DDIMER,  in the last 72 hours  Cardiac Enzymes No results found for this basename: CK, CKMB, TROPONINI, MYOGLOBIN,  in the last 168  hours ------------------------------------------------------------------------------------------------------------------ No components found with this basename: POCBNP,     Keawe Marcello D.O. on 11/20/2013 at 9:57 AM  Between 7am to 7pm - Pager - [phone]  After 7pm go to [email or web address] - password [email or web address]  And look for the night coverage person covering for me after hours  [facility] Office  [phone]

## 2013-11-21 DIAGNOSIS — Z9181 History of falling: Secondary | ICD-10-CM

## 2013-11-21 LAB — BASIC METABOLIC PANEL
BUN: 14 mg/dL (ref 6–23)
CO2: 22 mEq/L (ref 19–32)
Calcium: 8.6 mg/dL (ref 8.4–10.5)
Chloride: 105 mEq/L (ref 96–112)
Creatinine, Ser: 0.86 mg/dL (ref 0.50–1.10)
GFR calc non Af Amer: 56 mL/min — ABNORMAL LOW (ref >90)
GFR, EST AFRICAN AMERICAN: 65 mL/min — AB (ref >90)
Glucose, Bld: 101 mg/dL — ABNORMAL HIGH (ref 70–99)
POTASSIUM: 3.6 meq/L — AB (ref 3.7–5.3)
Sodium: 138 mEq/L (ref 137–147)

## 2013-11-21 LAB — VITAMIN D 1,25 DIHYDROXY
VITAMIN D 1, 25 (OH) TOTAL: 19 pg/mL (ref 18–72)
Vitamin D2 1, 25 (OH)2: <8 pg/mL
Vitamin D3 1, 25 (OH)2: 19 pg/mL

## 2013-11-21 LAB — CBC
HEMATOCRIT: 23.9 % — AB (ref 36.0–46.0)
HEMOGLOBIN: 7.9 g/dL — AB (ref 12.0–15.0)
MCH: 29.6 pg (ref 26.0–34.0)
MCHC: 33.1 g/dL (ref 30.0–36.0)
MCV: 89.5 fL (ref 78.0–100.0)
Platelets: 204 10*3/uL (ref 150–400)
RBC: 2.67 MIL/uL — ABNORMAL LOW (ref 3.87–5.11)
RDW: 14.8 % (ref 11.5–15.5)
WBC: 6.4 10*3/uL (ref 4.0–10.5)

## 2013-11-21 LAB — IRON AND TIBC
IRON: 57 ug/dL (ref 42–135)
Saturation Ratios: 18 % — ABNORMAL LOW (ref 20–55)
TIBC: 317 ug/dL (ref 250–470)
UIBC: 260 ug/dL (ref 125–400)

## 2013-11-21 LAB — RETICULOCYTES
RBC.: 3.15 MIL/uL — AB (ref 3.87–5.11)
Retic Count, Absolute: 104 10*3/uL (ref 19.0–186.0)
Retic Ct Pct: 3.3 % — ABNORMAL HIGH (ref 0.4–3.1)

## 2013-11-21 LAB — VITAMIN B12: Vitamin B-12: 410 pg/mL (ref 211–911)

## 2013-11-21 LAB — OCCULT BLOOD X 1 CARD TO LAB, STOOL: FECAL OCCULT BLD: NEGATIVE

## 2013-11-21 LAB — FERRITIN: FERRITIN: 107 ng/mL (ref 10–291)

## 2013-11-21 LAB — FOLATE: FOLATE: 5.4 ng/mL

## 2013-11-21 NOTE — Discharge Summary (Signed)
Physician Discharge Summary  Cathy Wallace ZSW:[id number] DOB: Apr 09, 1917 DOA: 11/16/2013  PCP: Kimber Relic, MD  Admit date: 11/16/2013 Discharge date: 11/23/2013  Time spent: 45 minutes  Recommendations for Outpatient Follow-up:  Patient will be discharged to [facility] skilled nursing facility. She is to continue physical therapy as well as occupational therapy as recommended by the nursing facility. Patient should also followup with her primary care physician as well as orthopedic surgery. Patient to continue her medications as prescribed.  Discharge Diagnoses:  Left intertrochanteric hip fracture History of PE and DVT Coronary artery disease Urinary tract infection Acute kidney injury Normocytic anemia Depression GERD Constipation  Discharge Condition: Stable  Diet recommendation: Regular  Filed Weights   11/16/13 2337  Weight: 53.9 kg (118 lb 13.3 oz)    History of present illness:  Cathy Wallace is a 80 y.o. female with Past medical history of right foot drop, DVT PE 2011, hypertension, CAD, pacemaker, GERD, breast cancer, recent right radius fracture. The patient presented with a mechanical fall. She has a history of right foot drop and is wearing brace. At night when she was walking with her walker, she lost her balance and fell to the ground and started having left-sided hip pain. At time of admission, patient denied any fever, chills, headache, cough, chest pain, palpitation, shortness of breath, orthopnea, PND, nausea, vomiting, abdominal pain, diarrhea, constipation, active bleeding, burning urination, dizziness, pedal edema, focal neurological deficit.  She was recently started on Bactrim for UTI. She was also having some blood in her urine for which she was asked to stop taking her Xarelto for few days. Patient resides at [facility].   Hospital Course:  Left intertrochanteric hip fracture  -Left hip Xray: Displaced and angulated intertrochanteric  fracture LEFT femur.  -CT of the head: Left parietal scalp contusion. No acute intracranial process identified.  -Orthopedic surgery consulted and performed IM nailing of the left hip  -PT and OT recommended SNF  -May go to Rehab at [facility]   History of PE and DVT  -Patient was on Xarelto as an outpatient, however given her age as well as renal function, patient was switched to Eliquis.   Coronary artery disease  -Patient currently chest pain-free  -Continue Cardizem, metoprolol   Urinary tract infection  -Diagnosis outpatient, patient was placed on Bactrim for 10 days  -Was placed on Cipro, received 5 day course  Acute kidney injury  -Mild bump in creatinine  -Will hold Bactrim, will give IV fluids and continue to monitor BMP   Normocytic Anemia  -Patient's baseline hemoglobin appears to be between 8-10  -Hb 9.5 today (from 7.9 on 5/24)  -Hemoglobin (11/18/13) 7.7, but repeat was 10.4 after receiving transfusion- 2uPRBCs  -Continue ferrous sulfate  -Will continue to monitor CBC  -May be secondary to surgery  -FOBT negative  -Anemia panel showed adequate iron and iron stores  Depression  -Continue Zoloft   GERD  -Continue PPI   Constipation  -Likely secondary to immobility and narcotics  -Continue prune juice and enema if needed  Procedures: IM Nail Left hip  Consultations: Orthopedic surgery  Discharge Exam: Filed Vitals:   11/23/13 0537  BP: 154/49  Pulse: 73  Temp: 97.5 F (36.4 C)  Resp: 21   Exam  General: Well developed, well nourished, NAD, appears stated age  HEENT: NCAT, mucous membranes moist.  Neck: Supple, no JVD, no masses  Cardiovascular: S1 S2 auscultated, Regular rate and rhythm.  Respiratory: Clear to auscultation bilaterally with  equal chest rise  Abdomen: Soft, nontender, nondistended, + bowel sounds  Extremities: warm dry without cyanosis clubbing  Neuro: AAOx3, no focal deficits  Skin: Without rashes exudates or nodules  Psych:  Normal affect and demeanor with intact judgement and insight  Discharge Instructions      Discharge Instructions   Active range of motion    Complete by:  As directed   Left hip and knee as tolerated     Discharge instructions    Complete by:  As directed   Patient will be discharged to [facility] skilled nursing facility. She is to continue physical therapy as well as occupational therapy as recommended by the nursing facility. Patient should also followup with her primary care physician as well as orthopedic surgery. Patient to continue her medications as prescribed.     Discharge wound care:    Complete by:  As directed   DISCHARGE WOUND CARE INSTRUCTIONS  Daily wound care    Discharge Pin Site Instructions  Dress pins daily with Kerlix roll starting on POD 2. Wrap the Kerlix so that it tamps the skin down around the pin-skin interface to prevent/limit motion of the skin relative to the pin.  (Pin-skin motion is the primary cause of pain and infection related to external fixator pin sites).  Remove any crust or coagulum that may obstruct drainage with a saline moistened gauze or soap and water.  After POD 3, if there is no discernable drainage on the pin site dressing, the interval for change can by increased to every other day.  You may shower with the fixator, cleaning all pin sites gently with soap and water.  If you have a surgical wound this needs to be completely dry and without drainage before showering.  The extremity can be lifted by the fixator to facilitate wound care and transfers.  Notify the office/Doctor if you experience increasing drainage, redness, or pain from a pin site, or if you notice purulent (thick, snot-like) drainage.  Discharge Wound Care Instructions  Do NOT apply any ointments, solutions or lotions to pin sites or surgical wounds.  These prevent needed drainage and even though solutions like hydrogen peroxide kill bacteria, they also damage cells  lining the pin sites that help fight infection.  Applying lotions or ointments can keep the wounds moist and can cause them to breakdown and open up as well. This can increase the risk for infection. When in doubt call the office.  Surgical incisions should be dressed daily.  If any drainage is noted, use one layer of adaptic, then gauze, Kerlix, and an ace wrap.  Once the incision is completely dry and without drainage, it may be left open to air out.  Showering may begin 36-48 hours later.  Cleaning gently with soap and water.  Traumatic wounds should be dressed daily as well.    One layer of adaptic, gauze, Kerlix, then ace wrap.  The adaptic can be discontinued once the draining has ceased    If you have a wet to dry dressing: wet the gauze with saline the squeeze as much saline out so the gauze is moist (not soaking wet), place moistened gauze over wound, then place a dry gauze over the moist one, followed by Kerlix wrap, then ace wrap.  CALL OFFICE WITH QUESTIONS OR CONCERNS [phone]     Increase activity slowly    Complete by:  As directed      Weight bearing as tolerated    Complete by:  As  directed             Medication List    STOP taking these medications       rivaroxaban 20 MG Tabs tablet  Commonly known as:  XARELTO     sulfamethoxazole-trimethoprim 800-160 MG per tablet  Commonly known as:  BACTRIM DS     triamterene-hydrochlorothiazide 37.5-25 MG per capsule  Commonly known as:  DYAZIDE      TAKE these medications       acetaminophen 325 MG tablet  Commonly known as:  TYLENOL  Take 650 mg by mouth 2 (two) times daily.     acetaminophen 325 MG tablet  Commonly known as:  TYLENOL  Take 2 tablets (650 mg total) by mouth 3 (three) times daily.     apixaban 2.5 MG Tabs tablet  Commonly known as:  ELIQUIS  Take 1 tablet (2.5 mg total) by mouth 2 (two) times daily.     diltiazem 180 MG 24 hr capsule  Commonly known as:  CARDIZEM CD  Take 180 mg by  mouth every morning.     ferrous sulfate 325 (65 FE) MG tablet  Take 1 tablet (325 mg total) by mouth daily with breakfast.     HYDROcodone-acetaminophen 5-325 MG per tablet  Commonly known as:  NORCO/VICODIN  Take 1-2 tablets by mouth every 6 (six) hours as needed for severe pain.     loperamide 2 MG tablet  Commonly known as:  IMODIUM A-D  Take 2 mg by mouth 4 (four) times daily as needed for diarrhea or loose stools.     loratadine 10 MG tablet  Commonly known as:  CLARITIN  Take 10 mg by mouth daily as needed for allergies.     LORazepam 1 MG tablet  Commonly known as:  ATIVAN  Take 0.5-2 mg by mouth 2 (two) times daily. Takes 0.5mg  as needed, 1mg  in am, 2mg  at bedtime     Melatonin 3 MG Tabs  Take 1 tablet by mouth at bedtime.     metoprolol tartrate 25 MG tablet  Commonly known as:  LOPRESSOR  Take 12.5 mg by mouth 2 (two) times daily.     nitroGLYCERIN 0.4 MG SL tablet  Commonly known as:  NITROSTAT  Place 1 tablet (0.4 mg total) under the tongue every 5 (five) minutes x 3 doses as needed for chest pain.     pantoprazole 40 MG tablet  Commonly known as:  PROTONIX  Take 40 mg by mouth every morning.     sertraline 100 MG tablet  Commonly known as:  ZOLOFT  Take 1 tablet (100 mg total) by mouth daily.       Allergies  Allergen Reactions  . Aspirin Other (See Comments)    taking xarelto  . Codeine Nausea And Vomiting  . Benadryl [Diphenhydramine Hcl] Other (See Comments)    unknown  . Flu Virus Vaccine Other (See Comments)    unknown  . Penicillins Rash  . Pneumococcal Vaccines Other (See Comments)    unknown   Follow-up Information   Follow up with HANDY,MICHAEL H, MD. Schedule an appointment as soon as possible for a visit in 10 days. (For wound re-check, For suture removal)    Specialty:  Orthopedic Surgery   Contact information:   [address] [place] [address] [phone]        The results of significant diagnostics  from this hospitalization (including imaging, microbiology, ancillary and laboratory) are listed below for reference.  Significant Diagnostic Studies: Dg Chest 1 View  11/16/2013   CLINICAL DATA:  Preoperative study.  EXAM: CHEST - 1 VIEW  COMPARISON:  Chest x-ray 07/03/2013.  FINDINGS: Patient is rotated to the right which distorts cardiomediastinal contours. No consolidative airspace disease. No definite pleural effusions. No evidence of pulmonary edema. Heart size is mildly enlarged. Skin fold artifact projecting over the lateral aspect of the right hemithorax. Atherosclerosis in the thoracic aorta.  IMPRESSION: 1. No radiographic evidence of acute cardiopulmonary disease. 2. Mild cardiomegaly. 3. Atherosclerosis.   Electronically Signed   By: Vinnie Langton M.D.   On: 11/16/2013 23:38   Dg Hip Complete Left  11/16/2013   CLINICAL DATA:  LEFT anterior hip pain post fall today  EXAM: LEFT HIP - COMPLETE 2+ VIEW  COMPARISON:  Pelvic radiograph 06/14/2013  FINDINGS: Osseous demineralization.  Displaced intertrochanteric fracture LEFT femur with varus angulation.  Deformities of RIGHT superior and inferior pubic rami consistent with old fractures.  No dislocation.  No acute pelvic fracture identified.  Compression deformities of lower lumbar vertebrae.  SI joints symmetric.  Mild BILATERAL hip joint space narrowing.  Scattered atherosclerotic calcifications.  IMPRESSION: Displaced and angulated intertrochanteric fracture LEFT femur.  Old posttraumatic deformities of the RIGHT pubic rami.  Osseous demineralization.  Lower lumbar compression fractures.   Electronically Signed   By: Lavonia Dana M.D.   On: 11/16/2013 19:49   Dg Femur Left  11/18/2013   CLINICAL DATA:  Left intertrochanteric femur fracture.  EXAM: LEFT FEMUR - 2 VIEW; DG C-ARM 61-120 MIN  COMPARISON:  Left hip radiographs dated 11/16/2013.  FINDINGS: Compression screw and rod fixation of the previously seen intertrochanteric fracture. Mild  residual lateral displacement of the distal fragment as well as medial displacement lesser trochanter fragment.  IMPRESSION: Hardware fixation of the previously seen left intertrochanteric fracture.   Electronically Signed   By: Enrique Sack M.D.   On: 11/18/2013 14:19   Ct Head Wo Contrast  11/16/2013   CLINICAL DATA:  Fall  EXAM: CT HEAD WITHOUT CONTRAST  TECHNIQUE: Contiguous axial images were obtained from the base of the skull through the vertex without intravenous contrast.  COMPARISON:  Prior CT from 06/28/2013  FINDINGS: Diffuse prominence of the CSF containing spaces is compatible with generalized cerebral atrophy. Scattered and confluent hypodensity within the periventricular and deep white matter of both cerebral hemispheres is consistent with moderate chronic microvascular ischemic disease.  There is no acute intracranial hemorrhage or infarct. No mass lesion or midline shift. Gray-white matter differentiation is well maintained. Ventricles are normal in size without evidence of hydrocephalus. CSF containing spaces are within normal limits. No extra-axial fluid collection.  The calvarium is intact.  Orbital soft tissues are within normal limits.  The paranasal sinuses and mastoid air cells are well pneumatized and free of fluid.  Small left parietal scalp contusion noted.  IMPRESSION: 1. Left parietal scalp contusion. No acute intracranial process identified. 2. Generalized cerebral atrophy with moderate chronic microvascular ischemic disease.   Electronically Signed   By: Jeannine Boga M.D.   On: 11/16/2013 20:05   Dg Femur Left Port  11/19/2013   CLINICAL DATA:  Postoperative changes left hip.  EXAM: PORTABLE LEFT FEMUR - 2 VIEW  COMPARISON:  No prior.  FINDINGS: Patient is status post open reduction internal fixation left hip fracture. Good anatomic alignment. Peripheral vascular calcification.  IMPRESSION: 1. Patient status post open reduction internal fixation left hip fracture, good  anatomic alignment.  2. Peripheral vascular disease.  Electronically Signed   By: Marcello Moores  Register   On: 11/19/2013 14:54   Dg Knee Left Port  11/17/2013   CLINICAL DATA:  Left hip fracture  EXAM: PORTABLE LEFT KNEE - 1-2 VIEW  COMPARISON:  None.  FINDINGS: Two portable views of the left knee submitted. No acute fracture or subluxation. Mild narrowing of medial joint compartment. Minimal spurring of medial femoral condyle. Atherosclerotic calcifications of femoral and popliteal artery.  IMPRESSION: No acute fracture or subluxation. Mild narrowing of medial joint compartment. Minimal spurring of medial femoral condyle.   Electronically Signed   By: Lahoma Crocker M.D.   On: 11/17/2013 10:22   Dg C-arm 1-60 Min  11/18/2013   CLINICAL DATA:  Left intertrochanteric femur fracture.  EXAM: LEFT FEMUR - 2 VIEW; DG C-ARM 61-120 MIN  COMPARISON:  Left hip radiographs dated 11/16/2013.  FINDINGS: Compression screw and rod fixation of the previously seen intertrochanteric fracture. Mild residual lateral displacement of the distal fragment as well as medial displacement lesser trochanter fragment.  IMPRESSION: Hardware fixation of the previously seen left intertrochanteric fracture.   Electronically Signed   By: Enrique Sack M.D.   On: 11/18/2013 14:19    Microbiology: Recent Results (from the past 240 hour(s))  SURGICAL PCR SCREEN     Status: None   Collection Time    11/17/13  4:21 PM      Result Value Ref Range Status   MRSA, PCR NEGATIVE  NEGATIVE Final   Staphylococcus aureus NEGATIVE  NEGATIVE Final   Comment:            The Xpert SA Assay (FDA     approved for NASAL specimens     in patients over 39 years of age),     is one component of     a comprehensive surveillance     program.  Test performance has     been validated by [employer] for patients greater     than or equal to 46 year old.     It is not intended     to diagnose infection nor to     guide or monitor treatment.      Labs: Basic Metabolic Panel:  Recent Labs Lab 11/19/13 0550 11/20/13 0451 11/21/13 0443 11/22/13 0500 11/23/13 0723  NA 135* 138 138 138 139  K 3.8 4.0 3.6* 3.6* 3.8  CL 102 105 105 100 102  CO2 20 23 22 22 23   GLUCOSE 101* 104* 101* 104* 103*  BUN 11 13 14 15 14   CREATININE 0.89 0.98 0.86 0.74 0.68  CALCIUM 8.5 8.5 8.6 8.6 8.8   Liver Function Tests:  Recent Labs Lab 11/18/13 0427  AST 34  ALT 15  ALKPHOS 72  BILITOT 0.4  PROT 5.2*  ALBUMIN 2.7*   No results found for this basename: LIPASE, AMYLASE,  in the last 168 hours No results found for this basename: AMMONIA,  in the last 168 hours CBC:  Recent Labs Lab 11/16/13 1849  11/19/13 0550 11/20/13 0451 11/21/13 0443 11/22/13 0500 11/23/13 0723  WBC 12.5*  < > 9.5 9.2 6.4 9.3 9.2  NEUTROABS 10.3*  --   --   --   --   --   --   HGB 10.9*  < > 9.0* 9.3* 7.9* 9.7* 9.5*  HCT 33.2*  < > 27.0* 28.1* 23.9* 28.2* 28.2*  MCV 90.0  < > 90.0 90.1 89.5 88.7 89.0  PLT 241  < >  164 190 204 314 314  < > = values in this interval not displayed. Cardiac Enzymes: No results found for this basename: CKTOTAL, CKMB, CKMBINDEX, TROPONINI,  in the last 168 hours BNP: BNP (last 3 results) No results found for this basename: PROBNP,  in the last 8760 hours CBG: No results found for this basename: GLUCAP,  in the last 168 hours     Signed:  [facility] 11/23/2013, 10:26 AM

## 2013-11-21 NOTE — Progress Notes (Signed)
Subjective: 3 Days Post-Op Procedure(s) (LRB): IM NAIL  LEFT HIP (Left) Patient reports pain as 2 on 0-10 scale.   Patient is doing much better today.  Mobility is more limited by equinus deformity of her right foot due to chronic drop foot secondary to severe spinal stenosis.  Objective: Vital signs in last 24 hours: Temp:  [98.1 F (36.7 C)-99.1 F (37.3 C)] 98.1 F (36.7 C) (05/24 0614) Pulse Rate:  [53-60] 53 (05/24 0614) Resp:  [18-20] 18 (05/24 0614) BP: (130-138)/(47-53) 133/53 mmHg (05/24 0614) SpO2:  [91 %-97 %] 91 % (05/24 0614)  Intake/Output from previous day: 05/23 0701 - 05/24 0700 In: -  Out: 100 [Urine:100] Intake/Output this shift:     Recent Labs  11/18/13 1315 11/19/13 0550 11/20/13 0451 11/21/13 0443  HGB 10.4* 9.0* 9.3* 7.9*    Recent Labs  11/20/13 0451 11/21/13 0443 11/21/13 0939  WBC 9.2 6.4  --   RBC 3.12* 2.67* 3.15*  HCT 28.1* 23.9*  --   PLT 190 204  --     Recent Labs  11/20/13 0451 11/21/13 0443  NA 138 138  K 4.0 3.6*  CL 105 105  CO2 23 22  BUN 13 14  CREATININE 0.98 0.86  GLUCOSE 104* 101*  CALCIUM 8.5 8.6   No results found for this basename: LABPT, INR,  in the last 72 hours  ABD soft Neurovascular intact Sensation intact distally Intact pulses distally Dorsiflexion/Plantar flexion intact Incision: scant drainage  Assessment/Plan: 3 Days Post-Op Procedure(s) (LRB): IM NAIL  LEFT HIP (Left) Advance diet Up with therapy D/C IV fluids Discharge to SNF on Monday or Tuesday   Cathy Wallace 11/21/2013, 11:41 AM

## 2013-11-21 NOTE — Progress Notes (Addendum)
[facility]                                                                              Patient Demographics  Cathy Wallace, is a 80 y.o. female, DOB - [birth year], ZOX:[id number]  Admit date - 11/16/2013   Admitting Physician Berle Mull, MD  Outpatient Primary MD for the patient is GREEN, Viviann Spare, MD  LOS - 5   Chief Complaint  Patient presents with  . Fall  . Hip Pain      HPI: Cathy Wallace is a 80 y.o. female with Past medical history of right foot drop, DVT PE 2011, hypertension, CAD, pacemaker, GERD, breast cancer, recent right radius fracture. The patient presented with a mechanical fall. She has a history of right foot drop and is wearing brace. At night when she was walking with her walker, she lost her balance and fell to the ground and started having left-sided hip pain.  At time of admission, patient denied any fever, chills, headache, cough, chest pain, palpitation, shortness of breath, orthopnea, PND, nausea, vomiting, abdominal pain, diarrhea, constipation, active bleeding, burning urination, dizziness, pedal edema, focal neurological deficit.  She was recently started on Bactrim for UTI.  She was also having some blood in her urine for which she was asked to stop taking her Xarelto for few days.  Patient resides at [facility].   Assessment & Plan   Left intertrochanteric hip fracture -Left hip Xray: Displaced and angulated intertrochanteric fracture LEFT femur. -CT of the head: Left parietal scalp contusion. No acute intracranial process identified. -Orthopedic surgery consulted and performed IM nailing of the left hip -PT and OT recommended SNF -May go to Rehab at [place] of PE and DVT -Patient was on Xarelto as an outpatient, however given her age as well as renal function, patient was switched to Eliquis.  Coronary artery disease -Patient currently chest pain-free -Continue Cardizem, metoprolol  Urinary tract  infection -Diagnosis outpatient, patient was placed on Bactrim for 10 days -Continue Cipro  Acute kidney injury -Mild bump in creatinine -Will hold Bactrim, will give IV fluids and continue to monitor BMP  Normocytic Anemia -Patient's baseline hemoglobin appears to be between 8-10 -Today, Hb 7.9 -Hemoglobin (11/18/13) 7.7, but repeat was 10.4 after receiving transfusion- 2uPRBCs -Will continue ferrous sulfate -Will continue to monitor CBC -May be secondary to surgery -Will order FOBT and anemia panel  Depression -Continue Zoloft  GERD -Continue PPI  Constipation -Likely secondary to immobility and narcotics -Will order prune juice and enema if needed  Code Status: DO NOT RESUSCITATE  Family Communication: None at bedside.  Disposition Plan: Admitted, likely discharge to [facility] on 11/22/2013  Time Spent in minutes   25 minutes  Procedures  IM Nail Left hip  Consults   Orthopedic surgery  DVT Prophylaxis  Eliquis  Lab Results  Component Value Date   PLT 204 11/21/2013    Medications  Scheduled Meds: . acetaminophen  650 mg Oral BID  . antiseptic oral rinse  15 mL Mouth Rinse q12n4p  . apixaban  2.5 mg Oral BID  . chlorhexidine  15 mL Mouth Rinse BID  . ciprofloxacin  250 mg Oral Q breakfast  . diltiazem  180 mg Oral q morning - 10a  . ferrous sulfate  325 mg Oral Q breakfast  . metoprolol tartrate  12.5 mg Oral BID  . pantoprazole  40 mg Oral q morning - 10a  . sertraline  100 mg Oral Daily   Continuous Infusions:   PRN Meds:.HYDROcodone-acetaminophen, loratadine, LORazepam, menthol-cetylpyridinium, metoCLOPramide (REGLAN) injection, metoCLOPramide, morphine injection, nitroGLYCERIN, ondansetron (ZOFRAN) IV, ondansetron, phenol  Antibiotics    Anti-infectives   Start     Dose/Rate Route Frequency Ordered Stop   11/18/13 1130  vancomycin (VANCOCIN) IVPB 1000 mg/200 mL premix     1,000 mg 200 mL/hr over 60 Minutes Intravenous Every 12 hours  11/18/13 1122 11/18/13 1330   11/18/13 0800  vancomycin (VANCOCIN) IVPB 1000 mg/200 mL premix     1,000 mg 200 mL/hr over 60 Minutes Intravenous  Once 11/17/13 0913 11/18/13 1321   11/18/13 0755  clindamycin (CLEOCIN) 900 MG/50ML IVPB    Comments:  [name]   : cabinet override      11/18/13 0755 11/18/13 1959   11/17/13 0800  ciprofloxacin (CIPRO) tablet 250 mg    Comments:  Cipro 250 mg po Daily for UTI with CrCl < 30 mL/min   250 mg Oral Daily with breakfast 11/17/13 0026         Subjective:   Cathy Wallace seen and examined today.  Patient she is feeling much better today.  She complains of constipation.  Currently denies any chest pain, shortness of breath, abdominal pain.  Objective:   Filed Vitals:   11/20/13 1234 11/20/13 1310 11/20/13 2220 11/21/13 0614  BP:  130/47 138/53 133/53  Pulse:  60 60 53  Temp:  99.1 F (37.3 C) 98.3 F (36.8 C) 98.1 F (36.7 C)  TempSrc:  Oral Oral Oral  Resp:  20 18 18   Height:      Weight:      SpO2: 97% 92% 92% 91%    Wt Readings from Last 3 Encounters:  11/16/13 53.9 kg (118 lb 13.3 oz)  11/16/13 53.9 kg (118 lb 13.3 oz)  10/20/13 51.256 kg (113 lb)     Intake/Output Summary (Last 24 hours) at 11/21/13 0817 Last data filed at 11/20/13 1200  Gross per 24 hour  Intake      0 ml  Output    100 ml  Net   -100 ml    Exam  General: Well developed, well nourished, NAD, appears stated age  HEENT: NCAT, mucous membranes moist.   Neck: Supple, no JVD, no masses  Cardiovascular: S1 S2 auscultated, no rubs, murmurs or gallops. Regular rate and rhythm.  Respiratory: Clear to auscultation bilaterally with equal chest rise  Abdomen: Soft, nontender, nondistended, + bowel sounds  Extremities: warm dry without cyanosis clubbing  Neuro: AAOx3, no focal deficits  Skin: Without rashes exudates or nodules  Psych: Normal affect and demeanor with intact judgement and insight  Data Review   Micro Results Recent  Results (from the past 240 hour(s))  SURGICAL PCR SCREEN     Status: None   Collection Time    11/17/13  4:21 PM      Result Value Ref Range Status   MRSA, PCR NEGATIVE  NEGATIVE Final   Staphylococcus aureus NEGATIVE  NEGATIVE Final   Comment:            The Xpert SA Assay (FDA     approved for NASAL specimens     in  patients over 80 years of age),     is one component of     a comprehensive surveillance     program.  Test performance has     been validated by [employer] for patients greater     than or equal to 21 year old.     It is not intended     to diagnose infection nor to     guide or monitor treatment.    Radiology Reports Dg Chest 1 View  11/16/2013   CLINICAL DATA:  Preoperative study.  EXAM: CHEST - 1 VIEW  COMPARISON:  Chest x-ray 07/03/2013.  FINDINGS: Patient is rotated to the right which distorts cardiomediastinal contours. No consolidative airspace disease. No definite pleural effusions. No evidence of pulmonary edema. Heart size is mildly enlarged. Skin fold artifact projecting over the lateral aspect of the right hemithorax. Atherosclerosis in the thoracic aorta.  IMPRESSION: 1. No radiographic evidence of acute cardiopulmonary disease. 2. Mild cardiomegaly. 3. Atherosclerosis.   Electronically Signed   By: Vinnie Langton M.D.   On: 11/16/2013 23:38   Dg Hip Complete Left  11/16/2013   CLINICAL DATA:  LEFT anterior hip pain post fall today  EXAM: LEFT HIP - COMPLETE 2+ VIEW  COMPARISON:  Pelvic radiograph 06/14/2013  FINDINGS: Osseous demineralization.  Displaced intertrochanteric fracture LEFT femur with varus angulation.  Deformities of RIGHT superior and inferior pubic rami consistent with old fractures.  No dislocation.  No acute pelvic fracture identified.  Compression deformities of lower lumbar vertebrae.  SI joints symmetric.  Mild BILATERAL hip joint space narrowing.  Scattered atherosclerotic calcifications.  IMPRESSION: Displaced and angulated  intertrochanteric fracture LEFT femur.  Old posttraumatic deformities of the RIGHT pubic rami.  Osseous demineralization.  Lower lumbar compression fractures.   Electronically Signed   By: Lavonia Dana M.D.   On: 11/16/2013 19:49   Ct Head Wo Contrast  11/16/2013   CLINICAL DATA:  Fall  EXAM: CT HEAD WITHOUT CONTRAST  TECHNIQUE: Contiguous axial images were obtained from the base of the skull through the vertex without intravenous contrast.  COMPARISON:  Prior CT from 06/28/2013  FINDINGS: Diffuse prominence of the CSF containing spaces is compatible with generalized cerebral atrophy. Scattered and confluent hypodensity within the periventricular and deep white matter of both cerebral hemispheres is consistent with moderate chronic microvascular ischemic disease.  There is no acute intracranial hemorrhage or infarct. No mass lesion or midline shift. Gray-white matter differentiation is well maintained. Ventricles are normal in size without evidence of hydrocephalus. CSF containing spaces are within normal limits. No extra-axial fluid collection.  The calvarium is intact.  Orbital soft tissues are within normal limits.  The paranasal sinuses and mastoid air cells are well pneumatized and free of fluid.  Small left parietal scalp contusion noted.  IMPRESSION: 1. Left parietal scalp contusion. No acute intracranial process identified. 2. Generalized cerebral atrophy with moderate chronic microvascular ischemic disease.   Electronically Signed   By: Jeannine Boga M.D.   On: 11/16/2013 20:05    CBC  Recent Labs Lab 11/16/13 1849  11/17/13 1520 11/18/13 0427 11/18/13 1315 11/19/13 0550 11/20/13 0451 11/21/13 0443  WBC 12.5*  < > 8.3 9.4  --  9.5 9.2 6.4  HGB 10.9*  < > 8.4* 7.7* 10.4* 9.0* 9.3* 7.9*  HCT 33.2*  < > 25.8* 23.9* 31.5* 27.0* 28.1* 23.9*  PLT 241  < > 202 181  --  164 190 204  MCV 90.0  < >  90.8 91.9  --  90.0 90.1 89.5  MCH 29.5  < > 29.6 29.6  --  30.0 29.8 29.6  MCHC 32.8  < >  32.6 32.2  --  33.3 33.1 33.1  RDW 14.3  < > 14.2 14.5  --  15.1 14.9 14.8  LYMPHSABS 1.3  --   --   --   --   --   --   --   MONOABS 0.9  --   --   --   --   --   --   --   EOSABS 0.1  --   --   --   --   --   --   --   BASOSABS 0.0  --   --   --   --   --   --   --   < > = values in this interval not displayed.  Chemistries   Recent Labs Lab 11/17/13 0532 11/18/13 0427 11/19/13 0550 11/20/13 0451 11/21/13 0443  NA 137 139 135* 138 138  K 3.9 3.9 3.8 4.0 3.6*  CL 101 104 102 105 105  CO2 22 23 20 23 22   GLUCOSE 124* 118* 101* 104* 101*  BUN 18 14 11 13 14   CREATININE 1.19* 1.12* 0.89 0.98 0.86  CALCIUM 8.7 8.4  8.1* 8.5 8.5 8.6  AST  --  34  --   --   --   ALT  --  15  --   --   --   ALKPHOS  --  72  --   --   --   BILITOT  --  0.4  --   --   --    ------------------------------------------------------------------------------------------------------------------ estimated creatinine clearance is 33.1 ml/min (by C-G formula based on Cr of 0.86). ------------------------------------------------------------------------------------------------------------------ No results found for this basename: HGBA1C,  in the last 72 hours ------------------------------------------------------------------------------------------------------------------ No results found for this basename: CHOL, HDL, LDLCALC, TRIG, CHOLHDL, LDLDIRECT,  in the last 72 hours ------------------------------------------------------------------------------------------------------------------ No results found for this basename: TSH, T4TOTAL, FREET3, T3FREE, THYROIDAB,  in the last 72 hours ------------------------------------------------------------------------------------------------------------------ No results found for this basename: VITAMINB12, FOLATE, FERRITIN, TIBC, IRON, RETICCTPCT,  in the last 72 hours  Coagulation profile  Recent Labs Lab 11/16/13 1849 11/17/13 0532  INR 1.83* 1.58*    No results  found for this basename: DDIMER,  in the last 72 hours  Cardiac Enzymes No results found for this basename: CK, CKMB, TROPONINI, MYOGLOBIN,  in the last 168 hours ------------------------------------------------------------------------------------------------------------------ No components found with this basename: POCBNP,     Janisha Bueso D.O. on 11/21/2013 at 8:17 AM  Between 7am to 7pm - Pager - [phone]  After 7pm go to [email or web address] - password [email or web address]  And look for the night coverage person covering for me after hours  [facility] Office  [phone]

## 2013-11-21 NOTE — Progress Notes (Signed)
ANTICOAGULATION CONSULT NOTE - Follow Up Consult  Pharmacy Consult for Apixaban Indication: Hx PE/DVT  Allergies  Allergen Reactions  . Aspirin Other (See Comments)    taking xarelto  . Codeine Nausea And Vomiting  . Benadryl [Diphenhydramine Hcl] Other (See Comments)    unknown  . Flu Virus Vaccine Other (See Comments)    unknown  . Penicillins Rash  . Pneumococcal Vaccines Other (See Comments)    unknown    Patient Measurements: Height: 5\' 3"  (160 cm) Weight: 118 lb 13.3 oz (53.9 kg) IBW/kg (Calculated) : 52.4 Heparin Dosing Weight:   Vital Signs: Temp: 98.1 F (36.7 C) (05/24 0614) Temp src: Oral (05/24 0614) BP: 133/53 mmHg (05/24 0614) Pulse Rate: 53 (05/24 0614)  Labs:  Recent Labs  11/19/13 0550 11/20/13 0451 11/21/13 0443  HGB 9.0* 9.3* 7.9*  HCT 27.0* 28.1* 23.9*  PLT 164 190 204  CREATININE 0.89 0.98 0.86    Estimated Creatinine Clearance: 33.1 ml/min (by C-G formula based on Cr of 0.86).   Medications:  Scheduled:  . acetaminophen  650 mg Oral BID  . antiseptic oral rinse  15 mL Mouth Rinse q12n4p  . apixaban  2.5 mg Oral BID  . chlorhexidine  15 mL Mouth Rinse BID  . ciprofloxacin  250 mg Oral Q breakfast  . diltiazem  180 mg Oral q morning - 10a  . ferrous sulfate  325 mg Oral Q breakfast  . metoprolol tartrate  12.5 mg Oral BID  . pantoprazole  40 mg Oral q morning - 10a  . sertraline  100 mg Oral Daily    Assessment: 80yo female on Xarelto pta for hx DVT/PE, now changed to Apixaban for renal fxn.   Hg down to 7.9 this AM, pltc wnl.  No bleeding noted.  Goal of Therapy:  prevention of VTE Monitor platelets by anticoagulation protocol: Yes   Plan:  1-  Continue Apixaban 2.5mg  po bid 2-  Pharmacy will sign off and follow peripherally  Gracy Bruins, PharmD Clinical Pharmacist [facility]

## 2013-11-22 DIAGNOSIS — R5381 Other malaise: Secondary | ICD-10-CM

## 2013-11-22 DIAGNOSIS — M216X9 Other acquired deformities of unspecified foot: Secondary | ICD-10-CM

## 2013-11-22 LAB — BASIC METABOLIC PANEL
BUN: 15 mg/dL (ref 6–23)
CO2: 22 mEq/L (ref 19–32)
Calcium: 8.6 mg/dL (ref 8.4–10.5)
Chloride: 100 mEq/L (ref 96–112)
Creatinine, Ser: 0.74 mg/dL (ref 0.50–1.10)
GFR calc Af Amer: 82 mL/min — ABNORMAL LOW (ref >90)
GFR calc non Af Amer: 71 mL/min — ABNORMAL LOW (ref >90)
Glucose, Bld: 104 mg/dL — ABNORMAL HIGH (ref 70–99)
Potassium: 3.6 mEq/L — ABNORMAL LOW (ref 3.7–5.3)
Sodium: 138 mEq/L (ref 137–147)

## 2013-11-22 LAB — CBC
HCT: 28.2 % — ABNORMAL LOW (ref 36.0–46.0)
Hemoglobin: 9.7 g/dL — ABNORMAL LOW (ref 12.0–15.0)
MCH: 30.5 pg (ref 26.0–34.0)
MCHC: 34.4 g/dL (ref 30.0–36.0)
MCV: 88.7 fL (ref 78.0–100.0)
Platelets: 314 K/uL (ref 150–400)
RBC: 3.18 MIL/uL — ABNORMAL LOW (ref 3.87–5.11)
RDW: 14.6 % (ref 11.5–15.5)
WBC: 9.3 K/uL (ref 4.0–10.5)

## 2013-11-22 MED ORDER — POTASSIUM CHLORIDE CRYS ER 20 MEQ PO TBCR
40.0000 meq | EXTENDED_RELEASE_TABLET | Freq: Once | ORAL | Status: AC
  Administered 2013-11-22: 40 meq via ORAL
  Filled 2013-11-22: qty 2

## 2013-11-22 NOTE — Progress Notes (Signed)
Spoke with pt who is agreeable to d/c to [facility] on 11/23/13.  Facility notified and can accept her on their SNF unit.  FL2 on chart for MD signature.  Thanks! Unit CSW to f/u in am.

## 2013-11-22 NOTE — Progress Notes (Signed)
[facility]                                                                              Patient Demographics  Cathy Wallace, is a 80 y.o. female, DOB - Jan 16, 1917, CBS:[id number]  Admit date - 11/16/2013   Admitting Physician Berle Mull, MD  Outpatient Primary MD for the patient is GREEN, Viviann Spare, MD  LOS - 6   Chief Complaint  Patient presents with  . Fall  . Hip Pain      HPI: Cathy Wallace is a 80 y.o. female with Past medical history of right foot drop, DVT PE 2011, hypertension, CAD, pacemaker, GERD, breast cancer, recent right radius fracture. The patient presented with a mechanical fall. She has a history of right foot drop and is wearing brace. At night when she was walking with her walker, she lost her balance and fell to the ground and started having left-sided hip pain.  At time of admission, patient denied any fever, chills, headache, cough, chest pain, palpitation, shortness of breath, orthopnea, PND, nausea, vomiting, abdominal pain, diarrhea, constipation, active bleeding, burning urination, dizziness, pedal edema, focal neurological deficit.  She was recently started on Bactrim for UTI.  She was also having some blood in her urine for which she was asked to stop taking her Xarelto for few days.  Patient resides at [facility].   Assessment & Plan   Left intertrochanteric hip fracture -Left hip Xray: Displaced and angulated intertrochanteric fracture LEFT femur. -CT of the head: Left parietal scalp contusion. No acute intracranial process identified. -Orthopedic surgery consulted and performed IM nailing of the left hip -PT and OT recommended SNF -May go to Rehab at [place] of PE and DVT -Patient was on Xarelto as an outpatient, however given her age as well as renal function, patient was switched to Eliquis.  Coronary artery disease -Patient currently chest pain-free -Continue Cardizem, metoprolol  Urinary tract  infection -Diagnosis outpatient, patient was placed on Bactrim for 10 days -Will discontinue Cipro (5 days)   Acute kidney injury -Mild bump in creatinine -Will hold Bactrim, will give IV fluids and continue to monitor BMP  Normocytic Anemia -Patient's baseline hemoglobin appears to be between 8-10 -Hb 9.7 today (from 7.9 on 5/24) -Hemoglobin (11/18/13) 7.7, but repeat was 10.4 after receiving transfusion- 2uPRBCs -Continue ferrous sulfate -Will continue to monitor CBC -May be secondary to surgery -FOBT negative -Anemia panel showed adequate iron and iron stores  Depression -Continue Zoloft  GERD -Continue PPI  Constipation -Likely secondary to immobility and narcotics -Continue prune juice and enema if needed  Code Status: DO NOT RESUSCITATE  Family Communication: Caregiver at bedside.  Disposition Plan: Admitted, likely discharge to [facility] on 11/23/2013  Time Spent in minutes   25 minutes  Procedures  IM Nail Left hip  Consults   Orthopedic surgery  DVT Prophylaxis  Eliquis  Lab Results  Component Value Date   PLT 314 11/22/2013    Medications  Scheduled Meds: . acetaminophen  650 mg Oral BID  . apixaban  2.5 mg Oral BID  . ciprofloxacin  250 mg Oral Q breakfast  . diltiazem  180 mg Oral q morning - 10a  . ferrous sulfate  325 mg Oral Q breakfast  . metoprolol tartrate  12.5 mg Oral BID  . pantoprazole  40 mg Oral q morning - 10a  . sertraline  100 mg Oral Daily   Continuous Infusions:   PRN Meds:.HYDROcodone-acetaminophen, loratadine, LORazepam, menthol-cetylpyridinium, metoCLOPramide (REGLAN) injection, metoCLOPramide, morphine injection, nitroGLYCERIN, ondansetron (ZOFRAN) IV, ondansetron, phenol  Antibiotics    Anti-infectives   Start     Dose/Rate Route Frequency Ordered Stop   11/18/13 1130  vancomycin (VANCOCIN) IVPB 1000 mg/200 mL premix     1,000 mg 200 mL/hr over 60 Minutes Intravenous Every 12 hours 11/18/13 1122 11/18/13 1330    11/18/13 0800  vancomycin (VANCOCIN) IVPB 1000 mg/200 mL premix     1,000 mg 200 mL/hr over 60 Minutes Intravenous  Once 11/17/13 0913 11/18/13 1321   11/18/13 0755  clindamycin (CLEOCIN) 900 MG/50ML IVPB    Comments:  [name]   : cabinet override      11/18/13 0755 11/18/13 1959   11/17/13 0800  ciprofloxacin (CIPRO) tablet 250 mg    Comments:  Cipro 250 mg po Daily for UTI with CrCl < 30 mL/min   250 mg Oral Daily with breakfast 11/17/13 0026         Subjective:   Cathy Wallace seen and examined today.  Patient she is feeling much better today.  She was able to have a bowel movement this morning. Currently denies any chest pain, shortness of breath, abdominal pain.  Objective:   Filed Vitals:   11/21/13 1305 11/21/13 2220 11/22/13 0559 11/22/13 1043  BP: 166/61 129/51 132/60 152/57  Pulse: 55 59 64 59  Temp: 97.6 F (36.4 C) 98.6 F (37 C) 98.7 F (37.1 C) 98.4 F (36.9 C)  TempSrc: Oral Oral Oral Oral  Resp: 18 18 20 20   Height:      Weight:      SpO2: 94% 91% 92% 99%    Wt Readings from Last 3 Encounters:  11/16/13 53.9 kg (118 lb 13.3 oz)  11/16/13 53.9 kg (118 lb 13.3 oz)  10/20/13 51.256 kg (113 lb)     Intake/Output Summary (Last 24 hours) at 11/22/13 1104 Last data filed at 11/22/13 0917  Gross per 24 hour  Intake    240 ml  Output      0 ml  Net    240 ml    Exam  General: Well developed, well nourished, NAD, appears stated age  HEENT: NCAT, mucous membranes moist.   Neck: Supple, no JVD, no masses  Cardiovascular: S1 S2 auscultated, no rubs, murmurs or gallops. Regular rate and rhythm.  Respiratory: Clear to auscultation bilaterally with equal chest rise  Abdomen: Soft, nontender, nondistended, + bowel sounds  Extremities: warm dry without cyanosis clubbing  Neuro: AAOx3, no focal deficits  Skin: Without rashes exudates or nodules  Psych: Normal affect and demeanor with intact judgement and insight  Data Review   Micro  Results Recent Results (from the past 240 hour(s))  SURGICAL PCR SCREEN     Status: None   Collection Time    11/17/13  4:21 PM      Result Value Ref Range Status   MRSA, PCR NEGATIVE  NEGATIVE Final   Staphylococcus aureus NEGATIVE  NEGATIVE Final   Comment:            The Xpert SA Assay (FDA     approved for NASAL specimens     in  patients over 56 years of age),     is one component of     a comprehensive surveillance     program.  Test performance has     been validated by [employer] for patients greater     than or equal to 59 year old.     It is not intended     to diagnose infection nor to     guide or monitor treatment.    Radiology Reports Dg Chest 1 View  11/16/2013   CLINICAL DATA:  Preoperative study.  EXAM: CHEST - 1 VIEW  COMPARISON:  Chest x-ray 07/03/2013.  FINDINGS: Patient is rotated to the right which distorts cardiomediastinal contours. No consolidative airspace disease. No definite pleural effusions. No evidence of pulmonary edema. Heart size is mildly enlarged. Skin fold artifact projecting over the lateral aspect of the right hemithorax. Atherosclerosis in the thoracic aorta.  IMPRESSION: 1. No radiographic evidence of acute cardiopulmonary disease. 2. Mild cardiomegaly. 3. Atherosclerosis.   Electronically Signed   By: Vinnie Langton M.D.   On: 11/16/2013 23:38   Dg Hip Complete Left  11/16/2013   CLINICAL DATA:  LEFT anterior hip pain post fall today  EXAM: LEFT HIP - COMPLETE 2+ VIEW  COMPARISON:  Pelvic radiograph 06/14/2013  FINDINGS: Osseous demineralization.  Displaced intertrochanteric fracture LEFT femur with varus angulation.  Deformities of RIGHT superior and inferior pubic rami consistent with old fractures.  No dislocation.  No acute pelvic fracture identified.  Compression deformities of lower lumbar vertebrae.  SI joints symmetric.  Mild BILATERAL hip joint space narrowing.  Scattered atherosclerotic calcifications.  IMPRESSION: Displaced and  angulated intertrochanteric fracture LEFT femur.  Old posttraumatic deformities of the RIGHT pubic rami.  Osseous demineralization.  Lower lumbar compression fractures.   Electronically Signed   By: Lavonia Dana M.D.   On: 11/16/2013 19:49   Ct Head Wo Contrast  11/16/2013   CLINICAL DATA:  Fall  EXAM: CT HEAD WITHOUT CONTRAST  TECHNIQUE: Contiguous axial images were obtained from the base of the skull through the vertex without intravenous contrast.  COMPARISON:  Prior CT from 06/28/2013  FINDINGS: Diffuse prominence of the CSF containing spaces is compatible with generalized cerebral atrophy. Scattered and confluent hypodensity within the periventricular and deep white matter of both cerebral hemispheres is consistent with moderate chronic microvascular ischemic disease.  There is no acute intracranial hemorrhage or infarct. No mass lesion or midline shift. Gray-white matter differentiation is well maintained. Ventricles are normal in size without evidence of hydrocephalus. CSF containing spaces are within normal limits. No extra-axial fluid collection.  The calvarium is intact.  Orbital soft tissues are within normal limits.  The paranasal sinuses and mastoid air cells are well pneumatized and free of fluid.  Small left parietal scalp contusion noted.  IMPRESSION: 1. Left parietal scalp contusion. No acute intracranial process identified. 2. Generalized cerebral atrophy with moderate chronic microvascular ischemic disease.   Electronically Signed   By: Jeannine Boga M.D.   On: 11/16/2013 20:05    CBC  Recent Labs Lab 11/16/13 1849  11/18/13 0427 11/18/13 1315 11/19/13 0550 11/20/13 0451 11/21/13 0443 11/22/13 0500  WBC 12.5*  < > 9.4  --  9.5 9.2 6.4 9.3  HGB 10.9*  < > 7.7* 10.4* 9.0* 9.3* 7.9* 9.7*  HCT 33.2*  < > 23.9* 31.5* 27.0* 28.1* 23.9* 28.2*  PLT 241  < > 181  --  164 190 204 314  MCV 90.0  < >  91.9  --  90.0 90.1 89.5 88.7  MCH 29.5  < > 29.6  --  30.0 29.8 29.6 30.5  MCHC  32.8  < > 32.2  --  33.3 33.1 33.1 34.4  RDW 14.3  < > 14.5  --  15.1 14.9 14.8 14.6  LYMPHSABS 1.3  --   --   --   --   --   --   --   MONOABS 0.9  --   --   --   --   --   --   --   EOSABS 0.1  --   --   --   --   --   --   --   BASOSABS 0.0  --   --   --   --   --   --   --   < > = values in this interval not displayed.  Chemistries   Recent Labs Lab 11/18/13 0427 11/19/13 0550 11/20/13 0451 11/21/13 0443 11/22/13 0500  NA 139 135* 138 138 138  K 3.9 3.8 4.0 3.6* 3.6*  CL 104 102 105 105 100  CO2 23 20 23 22 22   GLUCOSE 118* 101* 104* 101* 104*  BUN 14 11 13 14 15   CREATININE 1.12* 0.89 0.98 0.86 0.74  CALCIUM 8.4  8.1* 8.5 8.5 8.6 8.6  AST 34  --   --   --   --   ALT 15  --   --   --   --   ALKPHOS 72  --   --   --   --   BILITOT 0.4  --   --   --   --    ------------------------------------------------------------------------------------------------------------------ estimated creatinine clearance is 35.6 ml/min (by C-G formula based on Cr of 0.74). ------------------------------------------------------------------------------------------------------------------ No results found for this basename: HGBA1C,  in the last 72 hours ------------------------------------------------------------------------------------------------------------------ No results found for this basename: CHOL, HDL, LDLCALC, TRIG, CHOLHDL, LDLDIRECT,  in the last 72 hours ------------------------------------------------------------------------------------------------------------------ No results found for this basename: TSH, T4TOTAL, FREET3, T3FREE, THYROIDAB,  in the last 72 hours ------------------------------------------------------------------------------------------------------------------  Recent Labs  11/21/13 0939  VITAMINB12 410  FOLATE 5.4  FERRITIN 107  TIBC 317  IRON 57  RETICCTPCT 3.3*    Coagulation profile  Recent Labs Lab 11/16/13 1849 11/17/13 0532  INR 1.83* 1.58*     No results found for this basename: DDIMER,  in the last 72 hours  Cardiac Enzymes No results found for this basename: CK, CKMB, TROPONINI, MYOGLOBIN,  in the last 168 hours ------------------------------------------------------------------------------------------------------------------ No components found with this basename: POCBNP,     Rilynn Habel D.O. on 11/22/2013 at 11:04 AM  Between 7am to 7pm - Pager - [phone]  After 7pm go to [email or web address] - password [email or web address]  And look for the night coverage person covering for me after hours  [facility] Office  [phone]

## 2013-11-22 NOTE — Progress Notes (Signed)
Subjective: 4 Days Post-Op Procedure(s) (LRB): IM NAIL  LEFT HIP (Left) Patient reports pain as 2 on 0-10 scale.   Patient is doing much better today.  Mobility is more limited by equinus deformity of her right foot due to chronic drop foot secondary to severe spinal stenosis.  Objective: Vital signs in last 24 hours: Temp:  [97.6 F (36.4 C)-98.7 F (37.1 C)] 98.7 F (37.1 C) (05/25 0559) Pulse Rate:  [55-64] 64 (05/25 0559) Resp:  [18-20] 20 (05/25 0559) BP: (129-166)/(51-61) 132/60 mmHg (05/25 0559) SpO2:  [91 %-94 %] 92 % (05/25 0559)  Intake/Output from previous day: 05/24 0701 - 05/25 0700 In: 120 [P.O.:120] Out: -  Intake/Output this shift: Total I/O In: 120 [P.O.:120] Out: -    Recent Labs  11/20/13 0451 11/21/13 0443 11/22/13 0500  HGB 9.3* 7.9* 9.7*    Recent Labs  11/21/13 0443 11/21/13 0939 11/22/13 0500  WBC 6.4  --  9.3  RBC 2.67* 3.15* 3.18*  HCT 23.9*  --  28.2*  PLT 204  --  314    Recent Labs  11/21/13 0443 11/22/13 0500  NA 138 138  K 3.6* 3.6*  CL 105 100  CO2 22 22  BUN 14 15  CREATININE 0.86 0.74  GLUCOSE 101* 104*  CALCIUM 8.6 8.6   No results found for this basename: LABPT, INR,  in the last 72 hours  ABD soft Neurovascular intact Sensation intact distally Intact pulses distally Dorsiflexion/Plantar flexion intact Incision: scant drainage  Assessment/Plan: 4 Days Post-Op Procedure(s) (LRB): IM NAIL  LEFT HIP (Left) Advance diet Up with therapy D/C IV fluids Discharge to SNF on Monday or Tuesday  -[place] for d/c to SNF from orthopedic standpoint. D/c info has been filled out.    Renette Butters 11/22/2013, 9:51 AM

## 2013-11-22 NOTE — Progress Notes (Signed)
Physical Therapy Treatment Patient Details Name: Cathy Wallace MRN: [id number] DOB: 21-Aug-1916 Today's Date: 11/22/2013    History of Present Illness pt presents with L Femur fx s/p IM Nail.      PT Comments    Pt performed bed mobility with min guard requiring min VC for sequencing to get bil. LE over EOB for supine to sit.  Pt stood from sitting EOB with min-A +2 to power up, and required mod-A +2 to perform pivot transfer with VC for body positioning and sequencing.  Pt donned R AFO for improved R ankle dorsiflexion throughout therapy today.  Pt tolerated LE exercises well but was unable to perform R ankle pumps due to increased plantar flexor tone.  Will continue to follow.   Follow Up Recommendations  SNF     Equipment Recommendations  None recommended by PT    Recommendations for Other Services       Precautions / Restrictions Precautions Precautions: Fall Required Braces or Orthoses: Other Brace/Splint Other Brace/Splint: AFO for R LE to address increased plantarflexion tone Restrictions Weight Bearing Restrictions: Yes LLE Weight Bearing: Partial weight bearing LLE Partial Weight Bearing Percentage or Pounds: 50    Mobility  Bed Mobility Overal bed mobility: Needs Assistance Bed Mobility: Supine to Sit     Supine to sit: Min guard     General bed mobility comments: VC for sequencing with moving LEs over EOB, min guard for safety and proximity in case assistance was needed given pt difficulty with sliding LEs over EOB.  Transfers Overall transfer level: Needs assistance Equipment used: Rolling walker (2 wheeled) Transfers: Sit to/from [facility] Sit to Stand: +2 physical assistance;Min assist Stand pivot transfers: Mod assist;+2 physical assistance       General transfer comment: Pt required min-A +2 for sit to/from stand to power up from EOB and to control descent into sitting.  VC provided for hand and LE placement for sit to/from  stand.  Mod-A +2 required for pivot transfer with VC for body positioning and sequencing, pt has difficulty lifting either foot off of the floor to pivot in standing.  Ambulation/Gait                 Stairs            Wheelchair Mobility    Modified Rankin (Stroke Patients Only)       Balance Overall balance assessment: Needs assistance Sitting-balance support: Single extremity supported Sitting balance-Leahy Scale: Poor     Standing balance support: Bilateral upper extremity supported Standing balance-Leahy Scale: Poor Standing balance comment: Pt required heavy bil. UE support on RW in addition to +2 min-A  under her arms to maintain standing posture and balance.  Difficulty to initially assume hip extension.                    Cognition Arousal/Alertness: Awake/alert Behavior During Therapy: WFL for tasks assessed/performed Overall Cognitive Status: Within Functional Limits for tasks assessed                      Exercises General Exercises - Lower Extremity Ankle Circles/Pumps: AROM;10 reps;Left;Seated Quad Sets: AROM;Both;10 reps;Seated Short Arc Quad: Left;10 reps;AROM;Seated Long Arc Quad: AROM;Left;10 reps;Seated    General Comments General comments (skin integrity, edema, etc.): Pt attained better R LE ankle dorsiflexion with in-shoe AFO than with PRAFO boot.  Pt left with AFO donned to maintain stretch to plantarflexors.      Pertinent Vitals/Pain  Pt reports decreased pain at beginning of today's session.  Pt premedicated.    Home Living                      Prior Function            PT Goals (current goals can now be found in the care plan section) Acute Rehab PT Goals Patient Stated Goal: Back to [facility] PT Goal Formulation: With patient Time For Goal Achievement: 12/03/13 Potential to Achieve Goals: Fair Progress towards PT goals: Progressing toward goals    Frequency  Min 3X/week    PT Plan Current  plan remains appropriate    Co-evaluation             End of Session Equipment Utilized During Treatment: Gait belt;Other (comment) (R AFO) Activity Tolerance: Patient limited by pain;Patient limited by fatigue Patient left: in chair;with call bell/phone within reach     Time: 8502-7741 PT Time Calculation (min): 26 min  Charges:                       G Codes:      Jamielyn Petrucci, SPT 11/22/2013, 5:24 PM

## 2013-11-22 NOTE — Progress Notes (Signed)
Read, reviewed, edited and agree with student's findings and recommendations.  Arnet Hofferber B. Denim Start, PT, DPT #[phone]  

## 2013-11-23 ENCOUNTER — Non-Acute Institutional Stay (SKILLED_NURSING_FACILITY): Payer: Medicare Other | Admitting: Adult Health

## 2013-11-23 ENCOUNTER — Encounter: Payer: Self-pay | Admitting: Adult Health

## 2013-11-23 DIAGNOSIS — I10 Essential (primary) hypertension: Secondary | ICD-10-CM

## 2013-11-23 DIAGNOSIS — S72009A Fracture of unspecified part of neck of unspecified femur, initial encounter for closed fracture: Secondary | ICD-10-CM

## 2013-11-23 DIAGNOSIS — F32A Depression, unspecified: Secondary | ICD-10-CM

## 2013-11-23 DIAGNOSIS — F329 Major depressive disorder, single episode, unspecified: Secondary | ICD-10-CM

## 2013-11-23 DIAGNOSIS — F3289 Other specified depressive episodes: Secondary | ICD-10-CM

## 2013-11-23 DIAGNOSIS — I2699 Other pulmonary embolism without acute cor pulmonale: Secondary | ICD-10-CM

## 2013-11-23 DIAGNOSIS — N179 Acute kidney failure, unspecified: Secondary | ICD-10-CM

## 2013-11-23 DIAGNOSIS — D649 Anemia, unspecified: Secondary | ICD-10-CM

## 2013-11-23 DIAGNOSIS — S72002A Fracture of unspecified part of neck of left femur, initial encounter for closed fracture: Secondary | ICD-10-CM

## 2013-11-23 DIAGNOSIS — I2510 Atherosclerotic heart disease of native coronary artery without angina pectoris: Secondary | ICD-10-CM

## 2013-11-23 LAB — CBC
HCT: 28.2 % — ABNORMAL LOW (ref 36.0–46.0)
HEMOGLOBIN: 9.5 g/dL — AB (ref 12.0–15.0)
MCH: 30 pg (ref 26.0–34.0)
MCHC: 33.7 g/dL (ref 30.0–36.0)
MCV: 89 fL (ref 78.0–100.0)
PLATELETS: 314 10*3/uL (ref 150–400)
RBC: 3.17 MIL/uL — AB (ref 3.87–5.11)
RDW: 14.8 % (ref 11.5–15.5)
WBC: 9.2 10*3/uL (ref 4.0–10.5)

## 2013-11-23 LAB — BASIC METABOLIC PANEL
BUN: 14 mg/dL (ref 6–23)
CHLORIDE: 102 meq/L (ref 96–112)
CO2: 23 meq/L (ref 19–32)
Calcium: 8.8 mg/dL (ref 8.4–10.5)
Creatinine, Ser: 0.68 mg/dL (ref 0.50–1.10)
GFR calc Af Amer: 84 mL/min — ABNORMAL LOW (ref >90)
GFR calc non Af Amer: 73 mL/min — ABNORMAL LOW (ref >90)
GLUCOSE: 103 mg/dL — AB (ref 70–99)
POTASSIUM: 3.8 meq/L (ref 3.7–5.3)
SODIUM: 139 meq/L (ref 137–147)

## 2013-11-23 MED ORDER — APIXABAN 2.5 MG PO TABS: 2.5000 mg | ORAL_TABLET | Freq: Two times a day (BID) | ORAL | Status: DC

## 2013-11-23 MED ORDER — FERROUS SULFATE 325 (65 FE) MG PO TABS: 325.0000 mg | ORAL_TABLET | Freq: Every day | ORAL | Status: DC

## 2013-11-23 NOTE — Assessment & Plan Note (Signed)
Stable on current meds. Continue to monitor BP and report if continued elevation.

## 2013-11-23 NOTE — Assessment & Plan Note (Signed)
Continue current meds and monitor at this time. Her elevated readings may be due to pain and stress.

## 2013-11-23 NOTE — Assessment & Plan Note (Signed)
Resolved

## 2013-11-23 NOTE — Progress Notes (Signed)
I have read and agree with this note.  Time in/out: 10:06-10:41 Total time: 35 minutes  [place] [address], OTR/L [phone] 11/23/2013

## 2013-11-23 NOTE — Progress Notes (Signed)
Doing well  LLE: Wounds clean, dry, intact; o/w no change RLE increased tone of right ankle contracture  OK for d/c Will contact Julieta Gutting at [facility] F/u 2 weeks  Altamese Orovada, MD [facility] [phone] (p)

## 2013-11-23 NOTE — Assessment & Plan Note (Signed)
Stable at this time. Continue current meds and recheck CBC

## 2013-11-23 NOTE — Progress Notes (Signed)
I have seen and examined the patient. I agree with the findings above.  Rozanna Box, MD 11/23/2013 8:07 AM

## 2013-11-23 NOTE — Clinical Social Work Note (Signed)
CSW received report pt discharging to [facility] on 11/23/2013 from covering CSW. Pt confirmed discharge with admissions liaison of [facility]. CSW completed discharge packet and placed on pt's shadow chart. CSW and RN confirmed with pt discharge disposition, pt agreeable. EMS [facility]) has been arranged.   Pati Gallo, [place] Social Worker [phone]

## 2013-11-23 NOTE — Assessment & Plan Note (Signed)
S/P IM nailing to the left hip. Continue analgesia and begin PT and OT. F/U with Ortho as recommended.

## 2013-11-23 NOTE — Progress Notes (Signed)
Patient ID: Cathy Wallace, female   DOB: [birth year], 80 y.o.   MRN: [id number]   [facility] (31)  Code Status: DNR   Chief Complaint  Patient presents with  . f/u hospitalization for hip fx    HPI: This is a 80 y.o. Female previously living in the independent living section of [facility]. She was admitted to the hospital on 11/16/13 after a mechanical fall that resulted in a left intertrochanteric hip fx. She is s/p IM nailing of the left hip. She was discharged to skilled rehab for physical therapy.  Closed left intertrochanteric hip fx:  s/p IM nailing (11/18/13)  Normocytic anemia:  During her hospitalization she was noted to have acute anemia and was transfused 2 units of PRBC's, discharge Hgb was 9.5.  H/o DVE/PE: Previously on Xarelto. This was changed to Eliquis during her admission due to her renal fxn and age.   UTI: She was started on bactrim for a UTI prior to her hospitalization. This was changed to Cipro during her stay and received a 5 day course. There was reported hematuria and she was asked to stop her Xarelto for few days prior to her admission.   Acute kidney injury: mild bump in her creatinine during her stay. Her bactrim was discontinue and this resolved. Discharge Cr 0.68.  CAD: Currently stable with no CP on Cardizem and BB.  Depression: stable on Zoloft  HTN: BP slightly elevated on admission. No CP, SOB, edema noted on current meds.       Allergies  Allergen Reactions  . Aspirin Other (See Comments)    taking xarelto  . Codeine Nausea And Vomiting  . Benadryl [Diphenhydramine Hcl] Other (See Comments)    unknown  . Flu Virus Vaccine Other (See Comments)    unknown  . Penicillins Rash  . Pneumococcal Vaccines Other (See Comments)    unknown    MEDICATIONS -  reviewed  DATA REVIEWED  Radiologic Exams:  CT of the Head w/o contrast 11/16/13:  IMPRESSION:  1. Left parietal scalp contusion. No  acute intracranial process identified.  2. Generalized cerebral atrophy with moderate chronic microvascular ischemic disease. CXR (11/16/13):  IMPRESSION:  1. No radiographic evidence of acute cardiopulmonary disease.  2. Mild cardiomegaly.  3. Atherosclerosis. Left Hip xray: (11/16/13) IMPRESSION:  Displaced and angulated intertrochanteric fracture LEFT femur.  Old posttraumatic deformities of the RIGHT pubic rami.  Osseous demineralization.  Lower lumbar compression fractures.   Laboratory Studies: Lab Results  Component Value Date   WBC 9.2 11/23/2013   HGB 9.5* 11/23/2013   HCT 28.2* 11/23/2013   MCV 89.0 11/23/2013   PLT 314 11/23/2013   Lab Results  Component Value Date   NA 139 11/23/2013   K 3.8 11/23/2013   BUN 14 11/23/2013   CREATININE 0.68 11/23/2013   Lab Results  Component Value Date   NA 139 11/23/2013   K 3.8 11/23/2013   CL 102 11/23/2013   CO2 23 11/23/2013   GLUCOSE 103* 11/23/2013   BUN 14 11/23/2013   CREATININE 0.68 11/23/2013   CALCIUM 8.8 11/23/2013   ALBUMIN 2.7* 11/18/2013   AST 34 11/18/2013   ALT 15 11/18/2013   ALKPHOS 72 11/18/2013   BILITOT 0.4 11/18/2013   GFRNONAA 73* 11/23/2013   GFRAA 84* 11/23/2013   Lab Results  Component Value Date   VITAMINB12 410 11/21/2013        Past Medical History  Diagnosis Date  . DVT (deep venous thrombosis)  2011  . Pulmonary embolism Jan 2012  . HTN (hypertension)   . Spinal stenosis   . Bradycardia, on admit 08/09/2012  . Acute MI 08/10/2012    2D Echo - EF 55-60%, normal  . Pacemaker   . Depression   . GERD (gastroesophageal reflux disease)   . Cancer     hx of breast cancer  . Arthritis   . Closed pelvic fracture 11/11/2012  . Alcohol dependence 11/15/2012  . Long term (current) use of anticoagulants 11/15/2012  . Sacral fracture, closed 11/15/2012    Bilateral nondisplaced sacral fractures.   . L5 vertebral fracture 11/15/2012    Fracture of the L5 left transverse process.    . Anemia 11/15/2012  . B12  deficiency 12/01/2012  . Insomnia 12/01/2012  . Unspecified vitamin D deficiency 12/07/2012  . Anxiety   . Osteoarthritis 02/10/2013  . Lumbar vertebral fracture 06/15/2013    L3-4 12/15/20143  . Anginal pain   . Hyperlipemia    Past Surgical History  Procedure Laterality Date  . Abdominal hysterectomy    . Elbow surgery    . Cataract extraction Bilateral   . Cardiac catheterization  08/08/2012    2.5x16mm Emerge balloon predilation, 2.75x12mm VeriFLEX bare-metal stent was inserted into the RCA to cover proximal to mid segments of RCA deployed with Noncompliant Sprinter balloon 2.75x71mm up to 2.36mm dilated  . Orif wrist fracture Right 07/03/2013    Procedure: OPEN REDUCTION INTERNAL FIXATION (ORIF) RIGHT WRIST FRACTURE;  Surgeon: Linna Hoff, MD;  Location: [facility];  Service: Orthopedics;  Laterality: Right;  . Intramedullary (im) nail intertrochanteric Left 11/18/2013    Procedure: IM NAIL  LEFT HIP;  Surgeon: Rozanna Box, MD;  Location: [place];  Service: Orthopedics;  Laterality: Left;   Family Status  Relation Status Death Age  . Mother Deceased   . Father Deceased   . Maternal Grandmother Deceased   . Maternal Grandfather Deceased   . Paternal Grandmother Deceased   . Paternal Grandfather Deceased   . Daughter Alive   . Son Alive    History   Social History Narrative   Widowed since 2007. Resides at [facility], Independent [place] section since 2006. She was a homemaker.    No smoking history   Daily alcohol intake (2+oz)   DNR   Walks with walker   Exercise: walking, some leg exercise      REVIEW OF SYSTEMS  DATA OBTAINED: from patient, nurse, medical record, GENERAL: Feels well   No recent fever, fatigue, change in appetite or weight SKIN: No itch, rash. Multiple areas of bruising. Surgical wounds EYES: No eye pain, dryness or itching  No change in vision EARS: No earache, tinnitus, change in hearing NOSE: No congestion, drainage or  bleeding MOUTH/THROAT: No mouth or tooth pain  No sore throat   No difficulty chewing or swallowing RESPIRATORY: No cough, wheezing, SOB CARDIAC: No chest pain, palpitations  No edema. CHEST/BREASTS: No discomfort, discharge or lumps in breasts GI: No abdominal pain  No nausea, vomiting,diarrhea. Constipation noted in the hospital that has resolved per resident GU: No dysuria, frequency or urgency  No change in urine volume or character No nocturia or change in stream   MUSCULOSKELETEL:  Pain 4/10 to the left hip. Decreased ROM to the left hip and knee. Frequent falls. Right foot drop NEUROLOGIC: No dizziness, fainting, headache, numbness  No change in mental status.  PSYCHIATRIC: No feelings of anxiety, depression  Sleeps well.  No behavior  issue.   PHYSICAL EXAM Filed Vitals:   11/23/13 1407  BP: 166/74  Pulse: 63  Temp: 96.7 F (35.9 C)  Resp: 21  Weight: 118 lb (53.524 kg)  SpO2: 95%   Body mass index is 20.91 kg/(m^2).  GENERAL APPEARANCE: No acute distress, appropriately groomed, normal body habitus. Alert, pleasant, conversant. SKIN: Multiple areas of ecchymoses to left arm. Left hip with three areas of sutures which are dry and intact. Surrounding swelling and ecchymoses noted to the left hip and groin area. HEAD: Normocephalic, atraumatic EYES: Conjunctiva/lids clear. Pupils round, reactive. EOMs intact.  NECK: Supple, full ROM. No thyroid tenderness, enlargement or nodule LYMPHATICS: No head, neck or supraclavicular adenopathy RESPIRATORY: Breathing is even, unlabored. Lung sounds are clear and full.  CHEST/BREASTS: No chest deformity. Breasts without tenderness, mass, discharge CARDIOVASCULAR: Heart RRR. No murmur or extra heart sounds  ARTERIAL:BPPP+2  EDEMA: No peripheral edema. GASTROINTESTINAL: Abdomen is soft, non-tender, not distended w/ normal bowel sounds. No hepatic or splenic enlargement. No mass, ventral or inguinal hernia. GENITOURINARY: Bladder non tender,  not distended. MUSCULOSKELETAL: Moves all extremities with full ROM except left hip and knee with noted decreased ROM and strength 3/5. Right foot drop noted with brace in place NEUROLOGIC: Oriented to time, place, person.Speech clear, no tremor. PSYCHIATRIC: Mood and affect appropriate to situation  ASSESSMENT/PLAN  Pulmonary embolism, Jan 2012 Anticoagulation changed to Eliquis. Continue and monitor for signs of bleeding.   HTN (hypertension) Continue current meds and monitor at this time. Her elevated readings may be due to pain and stress.   Closed left hip fracture S/P IM nailing to the left hip. Continue analgesia and begin PT and OT. F/U with Ortho as recommended.  Anemia Stable at this time. Continue current meds and recheck CBC  Depression Stable on Zoloft. Continue current meds.  Acute kidney injury Resolved  CAD (coronary artery disease), residual 30% septal stenosis Stable on current meds. Continue to monitor BP and report if continued elevation.    Family/ staff Communication:     Goals of care:   Patient's  stated goal is return to [place] apartment. She acknowledges this will requie significant work with therapy. Anticipate Rehab stay 4-6 weeks   Labs/tests ordered: 11/30/13 CBC, CMP   Follow up: Return for As needed.  Guinevere Stephenson T.Kloe Oates, NP-C/[employer] executive, MSN [facility] [phone]  11/23/2013

## 2013-11-23 NOTE — Assessment & Plan Note (Signed)
Anticoagulation changed to Eliquis. Continue and monitor for signs of bleeding.

## 2013-11-23 NOTE — Assessment & Plan Note (Signed)
Stable on Zoloft. Continue current meds.

## 2013-11-23 NOTE — Progress Notes (Signed)
Occupational Therapy Treatment and Discharge Patient Details Name: Cathy Wallace MRN: [id number] DOB: 1917-05-23 Today's Date: 11/23/2013    History of present illness pt presents with L Femur fx s/p IM Nail.     OT comments  Pt admitted for the above diagnosis. Pt was able to stand using RW and max A +2 for safety. Pt got dressed in a gown and robe in anticipation of going to Rehab at [facility]. Pt will have 24hr 1 on 1 that will help her with transfers, ADLs, IADLs, and bed mobility. Pt is scheduled to be discharged today to go to SNF rehab. No further need for Acute OT services, and we will sign off.  Follow Up Recommendations  SNF;Supervision/Assistance - 24 hour          Precautions / Restrictions Precautions Precautions: Fall Required Braces or Orthoses: Other Brace/Splint Other Brace/Splint: AFO for R LE to address increased plantarflexion tone Restrictions LLE Weight Bearing: Partial weight bearing LLE Partial Weight Bearing Percentage or Pounds: 50 % of body weight       Mobility  Transfers Overall transfer level: Needs assistance Equipment used: Rolling walker (2 wheeled) Transfers: Sit to/from Stand Sit to Stand: +2 physical assistance;Max assist         General transfer comment: Pt required max-A +2 for sit to/from stand to power up from chair and to control descent into sitting.  VC provided for hand and LE placement for sit to/from stand.  Pt has difficulty lifting either foot off of the floor to widen her base of support so easier to make sure feet are a proper width apart prior to standing.    Balance Overall balance assessment: Needs assistance Sitting-balance support: Single extremity supported Sitting balance-Leahy Scale: Poor   Postural control: Posterior lean Standing balance support: Bilateral upper extremity supported Standing balance-Leahy Scale: Poor Standing balance comment: Pt required Mod A +2 inaddition to bilateral support from RW  during standing for balance. Pt has posterior lean while standing and has difficulty standing uptall with head up with cues.                    ADL Overall ADL's : Needs assistance/impaired                 Upper Body Dressing : Sitting;Minimal assistance   Lower Body Dressing: Maximal assistance Lower Body Dressing Details (indicate cue type and reason): While sitting in chair, pt was able to put her R sock on with S and required max A for her L sock and shoe. Pt required Max A to put her R foot in her AFO due to increased plantarflexion tone.              Functional mobility during ADLs: Rolling walker;+2 for physical assistance;Maximal assistance General ADL Comments: Pt was able to get herself to the edge of the chair with S. She needed A with dressing and sit>stand transfer, but will have 24 hour A at rehab facility.                Cognition   Behavior During Therapy: WFL for tasks assessed/performed Overall Cognitive Status: Within Functional Limits for tasks assessed                                    Pertinent Vitals/ Pain       No c/o of pain.  Progress Toward Goals  OT Goals(current goals can now be found in the care plan section)  Progress towards OT goals: Goals met/education completed, patient discharged from OT  Acute Rehab OT Goals Patient Stated Goal: Back to [facility] OT Goal Formulation: With patient Time For Goal Achievement: 11/30/13 Potential to Achieve Goals: Good  Plan Discharge plan remains appropriate       End of Session Equipment Utilized During Treatment: Gait belt;Rolling walker   Activity Tolerance Patient limited by fatigue   Patient Left in chair;with call bell/phone within reach;with bed alarm set;with family/visitor present           Time: 7824-2353 OT Time Calculation (min): 35 min  Charges: OT General Charges $OT Visit: 1 Procedure OT Treatments $Self Care/Home Management : 23-37  mins  Lyda Perone 11/23/2013, 12:39 PM

## 2013-11-26 ENCOUNTER — Non-Acute Institutional Stay (SKILLED_NURSING_FACILITY): Payer: Medicare Other | Admitting: Internal Medicine

## 2013-11-26 ENCOUNTER — Encounter: Payer: Self-pay | Admitting: Internal Medicine

## 2013-11-26 DIAGNOSIS — R5381 Other malaise: Secondary | ICD-10-CM

## 2013-11-26 DIAGNOSIS — Z7901 Long term (current) use of anticoagulants: Secondary | ICD-10-CM

## 2013-11-26 DIAGNOSIS — I2699 Other pulmonary embolism without acute cor pulmonale: Secondary | ICD-10-CM

## 2013-11-26 DIAGNOSIS — S72009A Fracture of unspecified part of neck of unspecified femur, initial encounter for closed fracture: Secondary | ICD-10-CM

## 2013-11-26 DIAGNOSIS — S72002A Fracture of unspecified part of neck of left femur, initial encounter for closed fracture: Secondary | ICD-10-CM

## 2013-11-26 NOTE — Progress Notes (Signed)
Patient ID: Cathy Wallace, female   DOB: 1916/09/17, 80 y.o.   MRN: [id number]    Location:  [place] of Service: SNF (31)  PCP: Estill Dooms, MD  Code Status: DO NOT RESUSCITATE, MOST, healthcare power of attorney  Extended Emergency Contact Information Primary Emergency Contact: Nadel,Beverly Address: [place] [address] Johnnette Litter of [place] Phone: [phone] Work Phone: [phone] Mobile Phone: [phone] Relation: Daughter Secondary Emergency Contact: Denman,Larry Address: [address]          [place], [place] [address] Johnnette Litter of [place] Phone: [phone] Work Phone: [phone] Relation: Other  Allergies  Allergen Reactions  . Aspirin Other (See Comments)    taking xarelto  . Codeine Nausea And Vomiting  . Benadryl [Diphenhydramine Hcl] Other (See Comments)    unknown  . Flu Virus Vaccine Other (See Comments)    unknown  . Penicillins Rash  . Pneumococcal Vaccines Other (See Comments)    unknown    Chief Complaint  Patient presents with  . Hospitalization Follow-up    Readmission to SNF following hospitalization    HPI:  Admit to SNF 11/23/13  Hospitalized 11/16/13 through 11/23/13. Patient had a fall and sustained a fracture of the left hip. This intertrochanteric fracture was repaired by intramedullary nailing by Dr. Merrilyn Puma, orthopedist.  She did well through the hospital stay. She did receive transfusion of 2 units packed red blood cells with the hemoglobin dropped to 7.4 g percent.  She is medically complicated with multiple previously diagnosed medical problems, but fortunately they are relatively stable at this time.  She is admitted to SNF for an anticipated short-term rehabilitation stay with return to her apartment. She will need strengthening, training for gait stability, and followup of her anemia.    Past Medical History  Diagnosis Date  . DVT  (deep venous thrombosis) 2011     Eliquis anticoagulation  . Pulmonary embolism Jan 2012    Eliquis anticoagulation  . HTN (hypertension)   . Spinal stenosis   . Bradycardia, on admit 08/09/2012  . Acute MI 08/10/2012    2D Echo - EF 55-60%, normal  . Depression   . GERD (gastroesophageal reflux disease)   . Cancer     hx of breast cancer  . Arthritis   . Closed pelvic fracture 11/11/2012  . Alcohol dependence 11/15/2012  . Long term (current) use of anticoagulants 11/15/2012  . Sacral fracture, closed 11/15/2012    Bilateral nondisplaced sacral fractures.   . L5 vertebral fracture 11/15/2012    Fracture of the L5 left transverse process.    . Anemia 11/15/2012  . B12 deficiency 12/01/2012  . Insomnia 12/01/2012  . Unspecified vitamin D deficiency 12/07/2012  . Anxiety 2013  . Osteoarthritis 02/10/2013  . Lumbar vertebral fracture 06/15/2013    L3-4 12/15/20143  . Anginal pain 2014  . Hyperlipemia 2014  . History of fall 2014  . Intertrochanteric fracture of left hip  11/16/48  . Coronary artery disease 2014  . Urinary tract infection 2015  . Constipation 2015  . Atherosclerosis 2015  . Cerebral atrophy 2015  . Cerebrovascular disease 2015    Small vessel disease  . Right radial fracture 2014  . Right foot drop 2014  . Unstable gait  2014  . Cardiomegaly 2015  . Fracture of right superior pubic ramus 2011    Superior and inferior pubic rami  fractures    Past Surgical History  Procedure Laterality Date  . Abdominal hysterectomy    . Elbow surgery    . Cataract extraction Bilateral   . Cardiac catheterization  08/08/2012    2.5x79mm Emerge balloon predilation, 2.75x68mm VeriFLEX bare-metal stent was inserted into the RCA to cover proximal to mid segments of RCA deployed with Noncompliant Sprinter balloon 2.75x45mm up to 2.75mm dilated  . Orif wrist fracture Right 07/03/2013    Procedure: OPEN REDUCTION INTERNAL FIXATION (ORIF) RIGHT WRIST FRACTURE;  Surgeon: Linna Hoff, MD;   Location: [facility];  Service: Orthopedics;  Laterality: Right;  . Intramedullary (im) nail intertrochanteric Left 11/18/2013    Procedure: IM NAIL  LEFT HIP;  Surgeon: Rozanna Box, MD;  Location: [facility];  Service: Orthopedics;  Laterality: Left;    CONSULTANTS Cardiology: [facility]; [facility]  PAST PROCEDURES 11/16/13 CT head:1. Left parietal scalp contusion. No acute intracranial process  identified. 2. Generalized cerebral atrophy with moderate chronic microvascular  ischemic disease.   Social History: History   Social History  . Marital Status: Widowed    Spouse Name: N/A    Number of Children: N/A  . Years of Education: N/A   Occupational History  . homemaker    Social History Main Topics  . Smoking status: Never Smoker   . Smokeless tobacco: Never Used  . Alcohol Use: Yes     Comment: 2 oz of scotch in evening  . Drug Use: No  . Sexual Activity: No   Other Topics Concern  . Not on file   Social History Narrative   Widowed since 2007. Resides at [facility], [facility] [place] section since 2006. She was a homemaker.    No smoking history   Daily alcohol intake (2+oz)   DNR   Walks with walker   Exercise: walking, some leg exercise     Family History Family Status  Relation Status Death Age  . Mother Deceased   . Father Deceased   . Maternal Grandmother Deceased   . Maternal Grandfather Deceased   . Paternal Grandmother Deceased   . Paternal Grandfather Deceased   . Daughter Alive   . Son Alive    No family history on file.   Medications: Patient's Medications  New Prescriptions   No medications on file  Previous Medications   ACETAMINOPHEN (TYLENOL) 325 MG TABLET    Take 2 tablets (650 mg total) by mouth 3 (three) times daily.   APIXABAN (ELIQUIS) 2.5 MG TABS TABLET    Take 1 tablet (2.5 mg total) by mouth 2 (two) times daily.   DILTIAZEM (CARDIZEM CD) 180 MG 24 HR CAPSULE    Take 180 mg by mouth every  morning.    FERROUS SULFATE 325 (65 FE) MG TABLET    Take 1 tablet (325 mg total) by mouth daily with breakfast.   HYDROCODONE-ACETAMINOPHEN (NORCO/VICODIN) 5-325 MG PER TABLET    Take 1-2 tablets by mouth every 6 (six) hours as needed for severe pain.   LOPERAMIDE (IMODIUM A-D) 2 MG TABLET    Take 2 mg by mouth 4 (four) times daily as needed for diarrhea or loose stools.   LORATADINE (CLARITIN) 10 MG TABLET    Take 10 mg by mouth daily as needed for allergies.    LORAZEPAM (ATIVAN) 1 MG TABLET    Take 0.5-2 mg by mouth 2 (two) times daily. Takes 0.5mg  as needed, 1mg  in am, 2mg  at bedtime   MELATONIN 3 MG  TABS    Take 1 tablet by mouth at bedtime.   METOPROLOL TARTRATE (LOPRESSOR) 25 MG TABLET    Take 12.5 mg by mouth 2 (two) times daily.    NITROGLYCERIN (NITROSTAT) 0.4 MG SL TABLET    Place 1 tablet (0.4 mg total) under the tongue every 5 (five) minutes x 3 doses as needed for chest pain.   PANTOPRAZOLE (PROTONIX) 40 MG TABLET    Take 40 mg by mouth every morning.   SERTRALINE (ZOLOFT) 100 MG TABLET    Take 1 tablet (100 mg total) by mouth daily.  Modified Medications   No medications on file  Discontinued Medications   ACETAMINOPHEN (TYLENOL) 325 MG TABLET    Take 650 mg by mouth 2 (two) times daily.     There is no immunization history on file for this patient.   Review of Systems  Constitutional: Positive for fatigue. Negative for fever, chills, diaphoresis, activity change, appetite change and unexpected weight change.       Frail, elderly  HENT: Positive for hearing loss (Hearing aid right ear). Negative for congestion, dental problem, drooling, ear discharge, mouth sores, rhinorrhea and sinus pressure.   Eyes: Negative.   Respiratory: Negative for cough, choking, shortness of breath, wheezing and stridor.   Cardiovascular: Negative for chest pain, palpitations and leg swelling.  Gastrointestinal: Negative for nausea, diarrhea, constipation and abdominal distention.  Endocrine:  Negative.   Genitourinary:       Incontinence  Musculoskeletal:       Recent surgery to the left hip following intertrochanteric fracture. Patient is able to stand with assistance from the wheelchair. She generally is a walker at home.  Skin:       Ecchymoses of the left leg and thigh  Allergic/Immunologic: Negative for environmental allergies, food allergies and immunocompromised state.  Neurological: Negative for dizziness, tremors, seizures, syncope, speech difficulty, weakness, light-headedness, numbness and headaches.  Hematological: Negative for adenopathy. Does not bruise/bleed easily.       Chronic anemia which is normocytic.  Psychiatric/Behavioral: Negative for suicidal ideas, hallucinations, behavioral problems, confusion, sleep disturbance, self-injury, dysphoric mood, decreased concentration and agitation. The patient is nervous/anxious. The patient is not hyperactive.       Filed Vitals:   11/26/13 1449  BP: 166/74  Pulse: 63  Temp: 96.4 F (35.8 C)  Resp: 16  Height: 5\' 3"  (1.6 m)  Weight: 118 lb (53.524 kg)  SpO2: 95%   Body mass index is 20.91 kg/(m^2).  Physical Exam  Constitutional: She is oriented to person, place, and time.  Frail, elderly.  HENT:  Partial deafness. Aid in right ear.  Eyes: Conjunctivae and EOM are normal. Pupils are equal, round, and reactive to light.  Neck: No JVD present. No tracheal deviation present. No thyromegaly present.  Crick in the right neck since 11/25/13.  Cardiovascular: Normal rate, regular rhythm, normal heart sounds and intact distal pulses.  Exam reveals no gallop and no friction rub.   No murmur heard. Pulmonary/Chest: No respiratory distress. She has no wheezes. She has no rales. She exhibits no tenderness.  Abdominal: She exhibits no distension and no mass. There is no tenderness. There is no rebound and no guarding.  Musculoskeletal: She exhibits edema and tenderness.  New for intertrochanteric fracture of the left  femur now status post surgery with nailing across the fracture. Unstable on standing. Generally uses a walker. Currently riding a wheelchair Wearing right AFO.  Lymphadenopathy:    She has no cervical adenopathy.  Neurological:  She is alert and oriented to person, place, and time. She has normal reflexes. No cranial nerve deficit. Coordination normal.  Right foot drop. Using AFO.  Skin:  Large ecchymosis of the left hip and thigh with extension across the knee.  Psychiatric: She has a normal mood and affect. Her behavior is normal. Judgment and thought content normal.  Anxious.        Labs reviewed: Admission on 11/16/2013, Discharged on 11/23/2013  No results displayed because visit has over 200 results.    Orders Only on 11/11/2013  Component Date Value Ref Range Status  . Hemoglobin 11/11/2013 12.3  12.0 - 16.0 g/dL Final  . HCT 11/11/2013 37  36 - 46 % Final  . Platelets 11/11/2013 268  150 - 399 K/L Final  . WBC 11/11/2013 16.3   Final     Assessment/Plan  1. Closed left hip fracture Patient will undergo rehabilitation. He will strengthen and increase gait stability utilizing physical therapy and occupational therapy. Patient needs to be more self-sufficient before returning to her apartment.  2. Debility Chronic condition which will be addressed with rehabilitation therapies  3. Long term (current) use of anticoagulants Despite the risk of falls, I believe she should remain on anticoagulation because of previous problems with pulmonary emboli and deep vein thrombosis  4. Pulmonary embolism, Jan 2012 Continue Eliquis

## 2013-11-30 ENCOUNTER — Non-Acute Institutional Stay (SKILLED_NURSING_FACILITY): Payer: Medicare Other | Admitting: Geriatric Medicine

## 2013-11-30 ENCOUNTER — Encounter: Payer: Self-pay | Admitting: Geriatric Medicine

## 2013-11-30 DIAGNOSIS — M21371 Foot drop, right foot: Secondary | ICD-10-CM

## 2013-11-30 DIAGNOSIS — S72002A Fracture of unspecified part of neck of left femur, initial encounter for closed fracture: Secondary | ICD-10-CM

## 2013-11-30 DIAGNOSIS — S72009A Fracture of unspecified part of neck of unspecified femur, initial encounter for closed fracture: Secondary | ICD-10-CM

## 2013-11-30 DIAGNOSIS — M216X9 Other acquired deformities of unspecified foot: Secondary | ICD-10-CM

## 2013-11-30 DIAGNOSIS — I10 Essential (primary) hypertension: Secondary | ICD-10-CM

## 2013-11-30 LAB — CBC AND DIFFERENTIAL
HEMATOCRIT: 30 % — AB (ref 36–46)
HEMOGLOBIN: 9.8 g/dL — AB (ref 12.0–16.0)
PLATELETS: 526 10*3/uL — AB (ref 150–399)
WBC: 7.7 10^3/mL

## 2013-11-30 LAB — BASIC METABOLIC PANEL
BUN: 16 mg/dL (ref 4–21)
Creatinine: 0.8 mg/dL (ref 0.5–1.1)
Glucose: 85 mg/dL
POTASSIUM: 3.5 mmol/L (ref 3.4–5.3)
Sodium: 140 mmol/L (ref 137–147)

## 2013-11-30 LAB — HEPATIC FUNCTION PANEL
ALT: 14 U/L (ref 7–35)
AST: 20 U/L (ref 13–35)
Alkaline Phosphatase: 123 U/L (ref 25–125)

## 2013-11-30 NOTE — Progress Notes (Signed)
Patient ID: Cathy Wallace, female   DOB: [birth year], 80 y.o.   MRN: [id number]   [facility] (31)  Code Status: DNR   Chief Complaint  Patient presents with  . Hip Injury    HPI: This is a 80 y.o. female resident of [facility],  [facility]  section was admitted to the hospital on 11/16/13 after a mechanical fall that resulted in a left intertrochanteric hip fx. She is s/p IM nailing of the left hip. She was discharged to skilled rehab for physical therapy.  Last visit Pulmonary embolism, Jan 2012 Anticoagulation changed to Eliquis. Continue and monitor for signs of bleeding. HTN (hypertension) Continue current meds and monitor at this time. Her elevated readings may be due to pain and stress. Closed left hip fracture S/P IM nailing to the left hip. Continue analgesia and begin PT and OT. F/U with Ortho as recommended. Anemia Stable at this time. Continue current meds and recheck CBC Depression Stable on Zoloft. Continue current meds. Acute kidney injury Resolved CAD (coronary artery disease), residual 30% septal stenosis Stable on current meds. Continue to monitor BP and report if continued elevation.  Since last visit patient has been working with PT and OT, progress is quite slow. Patient's drop foot on the right has worsened; internal rotation of the foot / leg. She is requiring use of a pro-care brace 24 hours a day to maintain alignment. Pain from left hip fracture adequately managed with 1-2 doses of hydrocodone daily. Review of facility record shows patient's systolic blood pressure continues to be elevated most of the time.  Lab today with mildly improved Hgb, electrolytes OK, albumin is low.     Allergies  Allergen Reactions  . Aspirin Other (See Comments)    taking xarelto  . Codeine Nausea And Vomiting  . Benadryl [Diphenhydramine Hcl] Other (See Comments)    unknown  . Flu Virus Vaccine Other (See  Comments)    unknown  . Penicillins Rash  . Pneumococcal Vaccines Other (See Comments)    unknown    MEDICATIONS -  reviewed  DATA REVIEWED  Radiologic Exams:  CT of the Head w/o contrast 11/16/13:  IMPRESSION:  1. Left parietal scalp contusion. No acute intracranial process identified.  2. Generalized cerebral atrophy with moderate chronic microvascular ischemic disease. CXR (11/16/13):  IMPRESSION:  1. No radiographic evidence of acute cardiopulmonary disease.  2. Mild cardiomegaly.  3. Atherosclerosis. Left Hip xray: (11/16/13) IMPRESSION:  Displaced and angulated intertrochanteric fracture LEFT femur.  Old posttraumatic deformities of the RIGHT pubic rami.  Osseous demineralization.  Lower lumbar compression fractures.   Laboratory Studies: Lab Results  Component Value Date   WBC 9.2 11/23/2013   HGB 9.5* 11/23/2013   HCT 28.2* 11/23/2013   MCV 89.0 11/23/2013   PLT 314 11/23/2013    Lab Results  Component Value Date   NA 139 11/23/2013   K 3.8 11/23/2013   CL 102 11/23/2013   CO2 23 11/23/2013   GLUCOSE 103* 11/23/2013   BUN 14 11/23/2013   CREATININE 0.68 11/23/2013   CALCIUM 8.8 11/23/2013   ALBUMIN 2.7* 11/18/2013   AST 34 11/18/2013   ALT 15 11/18/2013   ALKPHOS 72 11/18/2013   BILITOT 0.4 11/18/2013   GFRNONAA 73* 11/23/2013   GFRAA 84* 11/23/2013   Lab Results  Component Value Date   VITAMINB12 410 11/21/2013   Lab Results  Component Value Date   WBC 7.7 11/30/2013   HGB 9.8* 11/30/2013  HCT 30* 11/30/2013   MCV 89.0 11/23/2013   PLT 526* 11/30/2013   Lab Results  Component Value Date   NA 140 11/30/2013   K 3.5 11/30/2013   GLU 85 11/30/2013   BUN 16 11/30/2013   CREATININE 0.8 11/30/2013   Albumin  3.0      11/30/2013   REVIEW OF SYSTEMS  DATA OBTAINED: from patient, nurse, medical record, GENERAL: Feels well   No recent fever, fatigue, change in appetite or weight SKIN: No itch, rash.  Surgical wounds EYES: No eye pain, dryness or itching  No change in  vision EARS: No earache, tinnitus, change in hearing NOSE: No congestion, drainage or bleeding MOUTH/THROAT: No mouth or tooth pain  No sore throat   No difficulty chewing or swallowing RESPIRATORY: No cough, wheezing, SOB CARDIAC: No chest pain, palpitations  No edema. CHEST/BREASTS: No discomfort, discharge or lumps in breasts GI: No abdominal pain  No nausea, vomiting,diarrhea, constipation GU: No dysuria, frequency or urgency  No change in urine volume or character No nocturia or change in stream   MUSCULOSKELETEL:  No left hip pain at rest, 4-6/10 with activity.   Right foot drop is worse. NEUROLOGIC: No dizziness, fainting, headache, numbness  No change in mental status.  PSYCHIATRIC: No feelings of anxiety, depression  Sleeps well.  No behavior issue.   PHYSICAL EXAM Filed Vitals:   11/30/13 1234  BP: 178/70  Pulse: 53   There is no weight on file to calculate BMI.  GENERAL APPEARANCE: No acute distress, appropriately groomed, normal body habitus. Alert, pleasant, conversant. SKIN:  Left hip incision with surgical dressing intact., HEAD: Normocephalic, atraumatic EYES: Conjunctiva/lids clear. Pupils round, reactive. EOMs intact.  NECK: Supple, full ROM. No thyroid tenderness, enlargement or nodule LYMPHATICS: No head, neck or supraclavicular adenopathy RESPIRATORY: Breathing is even, unlabored. Lung sounds are clear and full.  CHEST/BREASTS: No chest deformity. Breasts without tenderness, mass, discharge CARDIOVASCULAR: Heart RRR. No murmur or extra heart sounds   EDEMA: No peripheral edema. GASTROINTESTINAL: Abdomen is soft, non-tender, not distended w/ normal bowel sounds. No hepatic or splenic enlargement. No mass, ventral or inguinal hernia. GENITOURINARY: Bladder non tender, not distended. MUSCULOSKELETAL: Moves all extremities with full ROM except left hip and knee with noted decreased ROM and strength 3/5. Right foot drop noted with brace in place, ambulating slowly  with walker/ supervision, requires direction/ encouragement. NEUROLOGIC: Oriented to time, place, person.Speech clear, no tremor. PSYCHIATRIC: Mood and affect appropriate to situation  ASSESSMENT/PLAN  HTN (hypertension) BP range 127-184/70-76, P 49-66. Elevated SBP at times, continue to monitor, may require medication adjustment  Closed left hip fracture S/P Left IM rod repair. Pain management is adequate, continue PT/OT, encourage activity as she tolerates. For return to Dr.Handy tomorrow  Right foot drop Rt.fott drop is worse with rt. Foot/leg  Internal rotation. OT is workng with bracing/excercise. This will slow progress.    Family/ staff Communication:     Goals of care:   [place]. She acknowledges this will require significant work with therapy. Anticipate Rehab stay 4-6 weeks   Labs/tests ordered:    Follow up: Return for As needed.  Mardene Celeste, NP-C [place] [facility] [phone]  11/30/2013

## 2013-12-03 NOTE — Assessment & Plan Note (Signed)
BP range 127-184/70-76, P 49-66. Elevated SBP at times, continue to monitor, may require medication adjustment

## 2013-12-03 NOTE — Assessment & Plan Note (Signed)
Rt.fott drop is worse with rt. Foot/leg  Internal rotation. OT is workng with bracing/excercise. This will slow progress.

## 2013-12-03 NOTE — Assessment & Plan Note (Signed)
S/P Left IM rod repair. Pain management is adequate, continue PT/OT, encourage activity as she tolerates. For return to Dr.Handy tomorrow

## 2013-12-06 ENCOUNTER — Encounter: Payer: Self-pay | Admitting: Geriatric Medicine

## 2013-12-06 ENCOUNTER — Non-Acute Institutional Stay (SKILLED_NURSING_FACILITY): Payer: Medicare Other | Admitting: Geriatric Medicine

## 2013-12-06 DIAGNOSIS — Z7189 Other specified counseling: Secondary | ICD-10-CM | POA: Insufficient documentation

## 2013-12-06 DIAGNOSIS — I10 Essential (primary) hypertension: Secondary | ICD-10-CM

## 2013-12-06 MED ORDER — DILTIAZEM HCL ER COATED BEADS 180 MG PO CP24: 180.0000 mg | ORAL_CAPSULE | Freq: Every day | ORAL | Status: DC

## 2013-12-06 NOTE — Assessment & Plan Note (Addendum)
BP range last 2 days 151-197/65-78. P 50-52. Patient receives both diltiazem /metoprolol at 9am (and metoprolol at 9pm). Change diltiazem to after lunch. If no improvement, add another agent

## 2013-12-06 NOTE — Assessment & Plan Note (Signed)
MOST form and general goals of care  reviewed with patient's daughter. She is very clear that patient's wishes are for no aggressive interventions to prolong her life. Patient and daughter agree recent hospitalization was necessary for repair of fractured hip for pain management and functional status.  1. DNR. 2. No hospitalization unless comfort needs cannot be met in facility. 3. Consider antibiotic use if infection occurs. 4. Consider IVF for defined trial period. 5. No Tube feeding

## 2013-12-06 NOTE — Progress Notes (Signed)
Patient ID: Cathy Wallace, female   DOB: March 11, 1917, 80 y.o.   MRN: [id number]   [facility] (31)  Code Status: DNR   Chief Complaint  Patient presents with  . Hypertension    HPI: This is a 80 y.o. female resident of [place],  Independent Living  section was admitted to the hospital on 11/16/13 after a mechanical fall that resulted in a left intertrochanteric hip fx. She is s/p IM nailing of the left hip. She was discharged to skilled rehab for physical therapy.  Last visit HTN (hypertension) BP range 127-184/70-76, P 49-66. Elevated SBP at times, continue to monitor, may require medication adjustment  Since last visit BP continues with elevated systolic readings. No H/A, SOB dizziness.   Daughter is visiting, asks about advanced directives. Would like to review MOST form.   Allergies  Allergen Reactions  . Aspirin Other (See Comments)    taking xarelto  . Codeine Nausea And Vomiting  . Benadryl [Diphenhydramine Hcl] Other (See Comments)    unknown  . Flu Virus Vaccine Other (See Comments)    unknown  . Penicillins Rash  . Pneumococcal Vaccines Other (See Comments)    unknown    MEDICATIONS -  reviewed  DATA REVIEWED  Radiologic Exams:  CT of the Head w/o contrast 11/16/13:  IMPRESSION:  1. Left parietal scalp contusion. No acute intracranial process identified.  2. Generalized cerebral atrophy with moderate chronic microvascular ischemic disease. CXR (11/16/13):  IMPRESSION:  1. No radiographic evidence of acute cardiopulmonary disease.  2. Mild cardiomegaly.  3. Atherosclerosis. Left Hip xray: (11/16/13) IMPRESSION:  Displaced and angulated intertrochanteric fracture LEFT femur.  Old posttraumatic deformities of the RIGHT pubic rami.  Osseous demineralization.  Lower lumbar compression fractures.   Laboratory Studies: Lab Results  Component Value Date   WBC 7.7 11/30/2013   HGB 9.8* 11/30/2013   HCT 30*  11/30/2013   MCV 89.0 11/23/2013   PLT 526* 11/30/2013    Lab Results  Component Value Date   NA 140 11/30/2013   K 3.5 11/30/2013   CL 102 11/23/2013   CO2 23 11/23/2013   GLUCOSE 103* 11/23/2013   BUN 16 11/30/2013   CREATININE 0.8 11/30/2013   CALCIUM 8.8 11/23/2013   ALBUMIN 2.7* 11/18/2013   AST 20 11/30/2013   ALT 14 11/30/2013   ALKPHOS 123 11/30/2013   BILITOT 0.4 11/18/2013   GFRNONAA 73* 11/23/2013   GFRAA 84* 11/23/2013   Lab Results  Component Value Date   VITAMINB12 410 11/21/2013   Lab Results  Component Value Date   WBC 7.7 11/30/2013   HGB 9.8* 11/30/2013   HCT 30* 11/30/2013   MCV 89.0 11/23/2013   PLT 526* 11/30/2013   Lab Results  Component Value Date   NA 140 11/30/2013   K 3.5 11/30/2013   GLU 85 11/30/2013   BUN 16 11/30/2013   CREATININE 0.8 11/30/2013   Albumin  3.0      11/30/2013   REVIEW OF SYSTEMS  DATA OBTAINED: from patient, nurse, medical record, GENERAL: Feels well   No recent fever, fatigue, change in appetite or weight MOUTH/THROAT: No mouth or tooth pain  No sore throat   No difficulty chewing or swallowing RESPIRATORY: No cough, wheezing, SOB CARDIAC: No chest pain, palpitations  No edema. GI: No abdominal pain  No nausea, vomiting,diarrhea, constipation GU: No dysuria, frequency or urgency  No change in urine volume or character No nocturia or change in stream  MUSCULOSKELETEL:  No left hip pain at rest, 4-6/10 with activity.   Right foot drop is worse. NEUROLOGIC: No dizziness, fainting, headache, numbness  No change in mental status.  PSYCHIATRIC: No feelings of anxiety, depression  Sleeps well.  No behavior issue.   PHYSICAL EXAM Filed Vitals:   12/06/13 1437  BP: 197/73  Pulse: 57   There is no weight on file to calculate BMI.  GENERAL APPEARANCE: No acute distress, appropriately groomed, normal body habitus. Alert, pleasant, conversant. EYES: Conjunctiva/lids clear.  RESPIRATORY: Breathing is even, unlabored. Lung sounds are clear and full.    CARDIOVASCULAR: Heart RRR. No murmur or extra heart sounds  Repeat BP 143/63   EDEMA: No peripheral edema. NEUROLOGIC: Oriented to time, place, person.Speech clear, no tremor. PSYCHIATRIC: Mood and affect appropriate to situation  ASSESSMENT/PLAN  Advanced care planning/counseling discussion MOST form and general goals of care  reviewed with patient's daughter. She is very clear that patient's wishes are for no aggressive interventions to prolong her life. Patient and daughter agree recent hospitalization was necessary for repair of fractured hip for pain management and functional status.  1. DNR. 2. No hospitalization unless comfort needs cannot be met in facility. 3. Consider antibiotic use if infection occurs. 4. Consider IVF for defined trial period. 5. No Tube feeding  HTN (hypertension) BP range last 2 days 151-197/65-78. P 50-52. Patient receives both diltiazem /metoprolol at 9am (and metoprolol at 9pm). Change diltiazem to after lunch. If no improvement, add another agent     Family/ staff Communication:  Extensive discussion with daughter re: goals of care   Goals of care:   Palliative care with emphasis on comfort and quality of life. Return to [place] apartment if possible.    Labs/tests ordered:    Follow up: Return for As needed.  Mardene Celeste, NP-C [place] [phone]  12/06/2013

## 2013-12-29 ENCOUNTER — Encounter: Payer: Medicare Other | Admitting: Nurse Practitioner

## 2013-12-29 ENCOUNTER — Encounter: Payer: Self-pay | Admitting: Geriatric Medicine

## 2014-01-05 ENCOUNTER — Encounter: Payer: Medicare Other | Admitting: Nurse Practitioner

## 2014-01-07 ENCOUNTER — Encounter: Payer: Self-pay | Admitting: Internal Medicine

## 2014-01-18 ENCOUNTER — Non-Acute Institutional Stay (SKILLED_NURSING_FACILITY): Payer: Medicare Other | Admitting: Nurse Practitioner

## 2014-01-18 DIAGNOSIS — D649 Anemia, unspecified: Secondary | ICD-10-CM

## 2014-01-18 DIAGNOSIS — I10 Essential (primary) hypertension: Secondary | ICD-10-CM

## 2014-01-18 DIAGNOSIS — I2699 Other pulmonary embolism without acute cor pulmonale: Secondary | ICD-10-CM

## 2014-01-18 DIAGNOSIS — F32A Depression, unspecified: Secondary | ICD-10-CM

## 2014-01-18 DIAGNOSIS — F329 Major depressive disorder, single episode, unspecified: Secondary | ICD-10-CM

## 2014-01-18 DIAGNOSIS — R5381 Other malaise: Secondary | ICD-10-CM

## 2014-01-18 DIAGNOSIS — F3289 Other specified depressive episodes: Secondary | ICD-10-CM

## 2014-01-18 MED ORDER — LISINOPRIL 10 MG PO TABS: 10.0000 mg | ORAL_TABLET | Freq: Every day | ORAL | Status: DC

## 2014-01-18 NOTE — Progress Notes (Signed)
Patient ID: Cathy Wallace, female   DOB: 03-29-1917, 80 y.o.   MRN: [id number]   [facility] (31)  Code Status: DNR   Chief Complaint  Patient presents with  . Medical Management of Chronic Issues    HPI: This is a 80 y.o. female resident of [place],  Independent Living  section was admitted to the hospital on 11/16/13 after a mechanical fall that resulted in a left intertrochanteric hip fx. She is s/p IM nailing of the left hip. She was discharged to skilled rehab for physical therapy.  Pt with a pmh of HTN, depression/anxiety, GERD, anemia, PE requiring anticoagulation, CAD who is being seen today for routine follow up on chronic conditions.  Sine last visit BP continues to be elevated. Medication was changed due to decrease in pulse with elevation in blood pressure but despite changes in medication blood pressure remain hight. Pulse has improved.    Allergies  Allergen Reactions  . Aspirin Other (See Comments)    taking xarelto  . Codeine Nausea And Vomiting  . Benadryl [Diphenhydramine Hcl] Other (See Comments)    unknown  . Flu Virus Vaccine Other (See Comments)    unknown  . Penicillins Rash  . Pneumococcal Vaccines Other (See Comments)    unknown   Current Outpatient Prescriptions on File Prior to Visit  Medication Sig Dispense Refill  . acetaminophen (TYLENOL) 325 MG tablet Take 2 tablets (650 mg total) by mouth 3 (three) times daily.      Marland Kitchen apixaban (ELIQUIS) 2.5 MG TABS tablet Take 1 tablet (2.5 mg total) by mouth 2 (two) times daily.  60 tablet  0  . citalopram (CELEXA) 20 MG tablet Take 20 mg by mouth daily.      Marland Kitchen diltiazem (CARDIZEM CD) 180 MG 24 hr capsule Take 1 capsule (180 mg total) by mouth daily after lunch.      . ferrous sulfate 325 (65 FE) MG tablet Take 1 tablet (325 mg total) by mouth daily with breakfast.    3  . HYDROcodone-acetaminophen (NORCO/VICODIN) 5-325 MG per tablet Take 1 tablet by mouth every 6  (six) hours as needed for severe pain.      Marland Kitchen loperamide (IMODIUM A-D) 2 MG tablet Take 2 mg by mouth 4 (four) times daily as needed for diarrhea or loose stools.      Marland Kitchen loratadine (CLARITIN) 10 MG tablet Take 10 mg by mouth daily as needed for allergies.       Marland Kitchen LORazepam (ATIVAN) 1 MG tablet Take 0.5-2 mg by mouth 2 (two) times daily. Takes 0.5mg  as needed, 1mg  in am, 2mg  at bedtime      . Melatonin 3 MG TABS Take 1 tablet by mouth at bedtime.      . nitroGLYCERIN (NITROSTAT) 0.4 MG SL tablet Place 1 tablet (0.4 mg total) under the tongue every 5 (five) minutes x 3 doses as needed for chest pain.  25 tablet  12   No current facility-administered medications on file prior to visit.    DATA REVIEWED  Radiologic Exams:  CT of the Head w/o contrast 11/16/13:  IMPRESSION:  1. Left parietal scalp contusion. No acute intracranial process identified.  2. Generalized cerebral atrophy with moderate chronic microvascular ischemic disease. CXR (11/16/13):  IMPRESSION:  1. No radiographic evidence of acute cardiopulmonary disease.  2. Mild cardiomegaly.  3. Atherosclerosis. Left Hip xray: (11/16/13) IMPRESSION:  Displaced and angulated intertrochanteric fracture LEFT femur.  Old posttraumatic deformities of the RIGHT  pubic rami.  Osseous demineralization.  Lower lumbar compression fractures.   Laboratory Studies: Lab Results  Component Value Date   WBC 7.7 11/30/2013   HGB 9.8* 11/30/2013   HCT 30* 11/30/2013   MCV 89.0 11/23/2013   PLT 526* 11/30/2013    Lab Results  Component Value Date   NA 140 11/30/2013   K 3.5 11/30/2013   CL 102 11/23/2013   CO2 23 11/23/2013   GLUCOSE 103* 11/23/2013   BUN 16 11/30/2013   CREATININE 0.8 11/30/2013   CALCIUM 8.8 11/23/2013   ALBUMIN 2.7* 11/18/2013   AST 20 11/30/2013   ALT 14 11/30/2013   ALKPHOS 123 11/30/2013   BILITOT 0.4 11/18/2013   GFRNONAA 73* 11/23/2013   GFRAA 84* 11/23/2013   Lab Results  Component Value Date   VITAMINB12 410 11/21/2013   Lab  Results  Component Value Date   WBC 7.7 11/30/2013   HGB 9.8* 11/30/2013   HCT 30* 11/30/2013   MCV 89.0 11/23/2013   PLT 526* 11/30/2013   Lab Results  Component Value Date   NA 140 11/30/2013   K 3.5 11/30/2013   GLU 85 11/30/2013   BUN 16 11/30/2013   CREATININE 0.8 11/30/2013   Albumin  3.0      11/30/2013   REVIEW OF SYSTEMS  DATA OBTAINED: from patient, nurse, medical record, GENERAL: Feels well   No recent fever, fatigue, change in appetite or weight MOUTH/THROAT: No mouth or tooth pain  No sore throat   No difficulty chewing or swallowing RESPIRATORY: No cough, wheezing, SOB CARDIAC: No chest pain, palpitations  No edema. GI: No abdominal pain  No nausea, vomiting,diarrhea, constipation GU: No dysuria, frequency or urgency  No change in urine volume or character No nocturia or change in stream   MUSCULOSKELETEL:  Pain has improved,  Right foot drop is worse. Does well in therapy but not motivated otherwise  NEUROLOGIC: No dizziness, fainting, headache, numbness  No change in mental status.  PSYCHIATRIC: has anxiety, worried about falling again, anxious about what is going to happen regarding her apartment and where she will move to get around the clock care  PHYSICAL EXAM Filed Vitals:   01/18/14 1010  BP: 163/68  Pulse: 62  Temp: 98 F (36.7 C)  Resp: 20   There is no weight on file to calculate BMI.  GENERAL APPEARANCE: No acute distress, appropriately groomed, normal body habitus. Alert, pleasant, conversant. EYES: Conjunctiva/lids clear.  RESPIRATORY: Breathing is even, unlabored. Lung sounds are CTA CARDIOVASCULAR: Heart RRR. No murmur or extra heart sounds  EDEMA: No peripheral edema. GASTROINTESTINAL: Abdomen is soft, non-tender, not distended w/ normal bowel sounds. No mass, hernias or organomegally GENITOURINARY: Bladder non tender, not distended  MUSCULOSKELETAL: MAE x4 NEUROLOGIC: Oriented X3.no tremor. Foot drop on right  PSYCHIATRIC: Mood and affect appropriate to  situation, no behavioral issues     ASSESSMENT/PLAN    1. Pulmonary embolism, Jan 2012 conts on liquids for long term anticoagulation   2.  Debility Encouraged pt to be more active throughout the day, participate in activities and self care, conts PT/OT but is not motivated to do recommended exercises other than with therapist   3. Anemia, unspecified anemia type Stable on recent labs, will cont to follow conts on iron  4. Depression With anxiety, recent increase in celexa, has not noticed any change wll cont to monitor   5. Hypertension  Blood pressure remains elevated, will add lisinopril 10 mg daily and follow up BMP  in 2 weeks

## 2014-02-01 LAB — BASIC METABOLIC PANEL
BUN: 16 mg/dL (ref 4–21)
Creatinine: 0.8 mg/dL (ref <=1.1)
Glucose: 79 mg/dL
Potassium: 3.5 mmol/L (ref 3.4–5.3)
Sodium: 140 mmol/L (ref 137–147)

## 2014-03-22 LAB — CBC AND DIFFERENTIAL
HEMATOCRIT: 32 % — AB (ref 36–46)
Hemoglobin: 10.5 g/dL — AB (ref 12.0–16.0)
PLATELETS: 278 10*3/uL (ref 150–399)
WBC: 6.4 10^3/mL

## 2014-04-06 ENCOUNTER — Encounter: Payer: Self-pay | Admitting: Physical Medicine & Rehabilitation

## 2014-04-25 ENCOUNTER — Telehealth: Payer: Self-pay | Admitting: *Deleted

## 2014-04-25 NOTE — Telephone Encounter (Signed)
[place] (nurse from [facility]) would like to discuss a mutual patients visit coming up in November. Pt name is Cathy Wallace (DOB:07/23/1916)

## 2014-04-26 NOTE — Telephone Encounter (Signed)
Patient will not have Botox injection on the day of evaluation. It will be an evaluation only and the injection will be subsequently scheduled

## 2014-04-26 NOTE — Telephone Encounter (Signed)
The nurse at [facility] says that Bud B/P runs high and was concerned if this would prevent her from receiving her Botox injections, as well, they were wanting to know if there were any restrictions for meds prior to her injections

## 2014-04-26 NOTE — Telephone Encounter (Signed)
Need to know what this is in regards to , typically nsg adn therapies will write up note for me to review during eval

## 2014-05-03 ENCOUNTER — Ambulatory Visit: Payer: Medicare Other | Admitting: Physical Medicine & Rehabilitation

## 2014-05-17 ENCOUNTER — Ambulatory Visit (HOSPITAL_BASED_OUTPATIENT_CLINIC_OR_DEPARTMENT_OTHER): Payer: Medicare Other | Admitting: Physical Medicine & Rehabilitation

## 2014-05-17 ENCOUNTER — Encounter: Payer: Self-pay | Admitting: Physical Medicine & Rehabilitation

## 2014-05-17 ENCOUNTER — Encounter: Payer: Medicare Other | Attending: Physical Medicine & Rehabilitation

## 2014-05-17 VITALS — BP 170/74 | HR 52 | Resp 14 | Ht <= 58 in

## 2014-05-17 DIAGNOSIS — M21371 Foot drop, right foot: Secondary | ICD-10-CM

## 2014-05-17 DIAGNOSIS — M24571 Contracture, right ankle: Secondary | ICD-10-CM

## 2014-05-17 DIAGNOSIS — I2510 Atherosclerotic heart disease of native coronary artery without angina pectoris: Secondary | ICD-10-CM

## 2014-05-17 DIAGNOSIS — M5416 Radiculopathy, lumbar region: Secondary | ICD-10-CM | POA: Diagnosis not present

## 2014-05-17 DIAGNOSIS — M24573 Contracture, unspecified ankle: Secondary | ICD-10-CM | POA: Insufficient documentation

## 2014-05-17 DIAGNOSIS — M4806 Spinal stenosis, lumbar region: Secondary | ICD-10-CM

## 2014-05-17 DIAGNOSIS — M48061 Spinal stenosis, lumbar region without neurogenic claudication: Secondary | ICD-10-CM

## 2014-05-17 NOTE — Progress Notes (Signed)
Subjective:    Patient ID: Cathy Wallace, female    DOB: Apr 04, 1917, 80 y.o.   MRN: [id number]  HPI Chief complaint right foot drop  80 year old female with history of multiple falls who had a onset of right foot drop 6-8 months ago. Does not recall what led up to this. Prior history of stroke although this was not recent Has had several CT head exams, showing microvascular changes however no large vessel infarcts have been noted.  Denies numbness in the right foot. She has weakness pulling the toes up. Uses PR AFO at night and jazz brace as well  Right IT hip fracture June 2015. Postop has been in skilled nursing facility. Has had PT.  Pain Inventory Average Pain 5 Pain Right Now 0 My pain is intermittent and aching  In the last 24 hours, has pain interfered with the following? General activity 0 Relation with others 0 Enjoyment of life 0 What TIME of day is your pain at its worst? daytime Sleep (in general) Good  Pain is worse with: walking and standing Pain improves with: rest and medication Relief from Meds: 5  Mobility ability to climb steps?  no do you drive?  no use a wheelchair  Function retired  Neuro/Psych weakness trouble walking anxiety  Prior Studies Any changes since last visit?  no  Physicians involved in your care Any changes since last visit?  no   History reviewed. No pertinent family history. History   Social History  . Marital Status: Widowed    Spouse Name: N/A    Number of Children: N/A  . Years of Education: N/A   Occupational History  . homemaker    Social History Main Topics  . Smoking status: Never Smoker   . Smokeless tobacco: Never Used  . Alcohol Use: Yes     Comment: 2 oz of scotch in evening  . Drug Use: No  . Sexual Activity: No   Other Topics Concern  . None   Social History Narrative   Widowed since 2007. Resides at [facility], Independent [place] section since 2006. She was a  homemaker.    No smoking history   Daily alcohol intake (2+oz)   DNR   Walks with walker   Exercise: walking, some leg exercise    Past Surgical History  Procedure Laterality Date  . Abdominal hysterectomy    . Elbow surgery    . Cataract extraction Bilateral   . Cardiac catheterization  08/08/2012    2.5x47mm Emerge balloon predilation, 2.75x9mm VeriFLEX bare-metal stent was inserted into the RCA to cover proximal to mid segments of RCA deployed with Noncompliant Sprinter balloon 2.75x32mm up to 2.75mm dilated  . Orif wrist fracture Right 07/03/2013    Procedure: OPEN REDUCTION INTERNAL FIXATION (ORIF) RIGHT WRIST FRACTURE;  Surgeon: Linna Hoff, MD;  Location: [facility];  Service: Orthopedics;  Laterality: Right;  . Intramedullary (im) nail intertrochanteric Left 11/18/2013    Procedure: IM NAIL  LEFT HIP;  Surgeon: Rozanna Box, MD;  Location: [facility];  Service: Orthopedics;  Laterality: Left;   Past Medical History  Diagnosis Date  . DVT (deep venous thrombosis) 2011     Eliquis anticoagulation  . Pulmonary embolism Jan 2012    Eliquis anticoagulation  . HTN (hypertension)   . Spinal stenosis   . Bradycardia, on admit 08/09/2012  . Acute MI 08/10/2012    2D Echo - EF 55-60%, normal  . Depression   . GERD (gastroesophageal reflux  disease)   . Cancer     hx of breast cancer  . Arthritis   . Closed pelvic fracture 11/11/2012  . Alcohol dependence 11/15/2012  . Long term (current) use of anticoagulants 11/15/2012  . Sacral fracture, closed 11/15/2012    Bilateral nondisplaced sacral fractures.   . L5 vertebral fracture 11/15/2012    Fracture of the L5 left transverse process.    . Anemia 11/15/2012  . B12 deficiency 12/01/2012  . Insomnia 12/01/2012  . Unspecified vitamin D deficiency 12/07/2012  . Anxiety 2013  . Osteoarthritis 02/10/2013  . Lumbar vertebral fracture 06/15/2013    L3-4 12/15/20143  . Anginal pain 2014  . Hyperlipemia 2014  . History of fall 2014  . Intertrochanteric  fracture of left hip  11/16/48  . Coronary artery disease 2014  . Urinary tract infection 2015  . Constipation 2015  . Atherosclerosis 2015  . Cerebral atrophy 2015  . Cerebrovascular disease 2015    Small vessel disease  . Right radial fracture 2014  . Right foot drop 2014  . Unstable gait  2014  . Cardiomegaly 2015  . Fracture of right superior pubic ramus 2011    Superior and inferior pubic rami fractures   BP 170/74 mmHg  Pulse 52  Resp 14  Ht 4\' 1"  (1.245 m)  SpO2 97%  Opioid Risk Score:   Fall Risk Score: Moderate Fall Risk (6-13 points)  Review of Systems  Constitutional: Negative.   HENT: Negative.   Eyes: Negative.   Respiratory: Negative.   Cardiovascular: Negative.   Gastrointestinal: Negative.   Endocrine: Negative.   Genitourinary: Negative.   Musculoskeletal: Positive for myalgias and arthralgias.  Skin: Negative.   Allergic/Immunologic: Negative.   Neurological:       Trouble walking  Hematological: Negative.   Psychiatric/Behavioral: The patient is nervous/anxious.        Objective:   Physical Exam  Right posterior leaf spring AFO with anti-inversion strap Right knee without evidence of effusion. No mass near the fibular head no tenderness near the fibular head. No popliteal mass palpated Quad strength 4/5 hamstring 4/5 Ankle dorsiflexor trace toe extensors trace Toe flexors and ankle plantar flexors 3 minus Sensation reduced at the dorsum of the foot primarily in the first dorsal webspace  Deep tendon reflexes normal at the knee Absent at the right ankle  No evidence of clonus  Right ankle contracture at 30 unable to stretch      Assessment & Plan:  1. Right foot drop differential diagnosis includes L5 radiculopathy with history of spinal stenosis, sciatic neuropathy and peroneal neuropathy are also in the differential. We discussed EMG may be helpful in delineating the etiology however may not influence choices given that the patient  does not want any type of surgery, Including Achilles lengthening or spine decompression.  Since there is no evidence of spasticity, Botulinum toxin will not be helpful in this situation. I discussed this with the patient, her son as well as her daughter.  2. Right ankle contracture secondary to above.She has been wearing A spring loaded orthosis for at least 6 months without any improvements. She states that it's also uncomfortable for her. I think she could discontinue this.She does use  PRAFO at nightWhich I think is appropriate to maintain her current range of motion. She is essentially nonambulatory except with staff assistance secondary to multiple Falls which have resulted in hip fracture, wrist fracture and head trauma.  I do recommend AFO when she is doing transfers  or the limited ambulation she does with staff.  PM and R follow-up on a when necessary basis

## 2014-05-17 NOTE — Patient Instructions (Signed)
No need for Botox since this is not a spasticity issue

## 2014-05-18 ENCOUNTER — Telehealth: Payer: Self-pay | Admitting: *Deleted

## 2014-05-18 NOTE — Telephone Encounter (Signed)
Pt rec'd new order to discharge Jazz splint, has a concern about the order and would like to discuss concerns with MD or nurse

## 2014-05-19 NOTE — Telephone Encounter (Signed)
2nd call rec'd for same request (JAS splint),Rob Stuvanic at [facility] would like Dr. Letta Pate to call

## 2014-05-24 ENCOUNTER — Non-Acute Institutional Stay (SKILLED_NURSING_FACILITY): Payer: Medicare Other | Admitting: Adult Health

## 2014-05-24 ENCOUNTER — Encounter: Payer: Self-pay | Admitting: Adult Health

## 2014-05-24 DIAGNOSIS — F411 Generalized anxiety disorder: Secondary | ICD-10-CM

## 2014-05-24 DIAGNOSIS — D649 Anemia, unspecified: Secondary | ICD-10-CM

## 2014-05-24 DIAGNOSIS — I10 Essential (primary) hypertension: Secondary | ICD-10-CM

## 2014-05-24 DIAGNOSIS — M810 Age-related osteoporosis without current pathological fracture: Secondary | ICD-10-CM

## 2014-05-24 DIAGNOSIS — I2699 Other pulmonary embolism without acute cor pulmonale: Secondary | ICD-10-CM

## 2014-05-24 DIAGNOSIS — M1712 Unilateral primary osteoarthritis, left knee: Secondary | ICD-10-CM

## 2014-05-24 NOTE — Assessment & Plan Note (Signed)
Hg stable on ferrous sulfate. Continue current regimen and monitor.

## 2014-05-24 NOTE — Assessment & Plan Note (Signed)
Increase Lisinopril 40 mg, check BMP in 2 weeks.

## 2014-05-24 NOTE — Assessment & Plan Note (Signed)
Continues to report anxiety. Continue Ativan and buspirone. Consider increasing Buspirone at the next visit.

## 2014-05-24 NOTE — Progress Notes (Signed)
Patient ID: Cathy Wallace, female   DOB: [birth year], 80 y.o.   MRN: [id number]  Nursing Home Location:  [place] of Service: [id number] [phone])  Chief Complaint  Patient presents with  . Medical Management of Chronic Issues    HPI:  80 y.o.  residing at [facility]. I am here to review her chronic medical issues.  Her BP has been elevated recently ranging 150-180/60-80. She denies CP or SOB or edema. She reports some pain in her right knee that has been ongoing and occasionally uses hydrocodone for pain. She report having some issues adjusting to living in skilled care, stating that she hates depending on other for help and that she does not see her friends as often. She admits to anxiety and uses ativan and Buspar for RX. She moved to skilled care due to increased needs with ADLs due to foot drop on the right foot.    Review of Systems:  Review of Systems  Constitutional: Positive for fatigue. Negative for fever, chills, diaphoresis, activity change, appetite change and unexpected weight change.       Frail, elderly  HENT: Positive for hearing loss (Hearing aid right ear). Negative for congestion, dental problem, drooling, ear discharge, mouth sores, rhinorrhea and sinus pressure.   Eyes: Negative.   Respiratory: Negative for cough, choking, shortness of breath, wheezing and stridor.   Cardiovascular: Negative for chest pain, palpitations and leg swelling.  Gastrointestinal: Negative for nausea, diarrhea, constipation and abdominal distention.  Endocrine: Negative.   Genitourinary: Negative for dysuria and difficulty urinating.  Musculoskeletal: Positive for arthralgias and gait problem. Negative for myalgias, back pain and joint swelling.       Uses walker  Skin: Negative for color change, pallor and rash.       Ecchymoses of the left leg and thigh  Allergic/Immunologic: Negative for environmental allergies, food allergies and  immunocompromised state.  Neurological: Negative for dizziness, tremors, seizures, syncope, speech difficulty, weakness, light-headedness, numbness and headaches.  Hematological: Negative for adenopathy. Does not bruise/bleed easily.       Chronic anemia which is normocytic.  Psychiatric/Behavioral: Negative for suicidal ideas, hallucinations, behavioral problems, confusion, sleep disturbance, self-injury, dysphoric mood, decreased concentration and agitation. The patient is nervous/anxious. The patient is not hyperactive.     Medications: Patient's Medications  New Prescriptions   No medications on file  Previous Medications   ACETAMINOPHEN (TYLENOL) 325 MG TABLET    Take 2 tablets (650 mg total) by mouth 3 (three) times daily.   APIXABAN (ELIQUIS) 2.5 MG TABS TABLET    Take 1 tablet (2.5 mg total) by mouth 2 (two) times daily.   BUSPIRONE (BUSPAR) 15 MG TABLET    Take 15 mg by mouth 3 (three) times daily.   CITALOPRAM (CELEXA) 40 MG TABLET    Take 40 mg by mouth daily.   DILTIAZEM (CARDIZEM CD) 180 MG 24 HR CAPSULE    Take 1 capsule (180 mg total) by mouth daily after lunch.   DOXAZOSIN (CARDURA) 8 MG TABLET    Take 8 mg by mouth daily.   FERROUS SULFATE 325 (65 FE) MG TABLET    Take 1 tablet (325 mg total) by mouth daily with breakfast.   HYDROCODONE-ACETAMINOPHEN (NORCO/VICODIN) 5-325 MG PER TABLET    Take 1 tablet by mouth every 6 (six) hours as needed for severe pain.   LISINOPRIL (PRINIVIL,ZESTRIL) 20 MG TABLET    Take 20 mg by mouth daily.   LOPERAMIDE (  IMODIUM A-D) 2 MG TABLET    Take 2 mg by mouth 4 (four) times daily as needed for diarrhea or loose stools.   LORATADINE (CLARITIN) 10 MG TABLET    Take 10 mg by mouth daily as needed for allergies.    LORAZEPAM (ATIVAN) 1 MG TABLET    Take 0.5-2 mg by mouth 2 (two) times daily. Takes 0.5mg  as needed, 1mg  in am, 2mg  at bedtime   MELATONIN 3 MG TABS    Take 1 tablet by mouth at bedtime.   NITROGLYCERIN (NITROSTAT) 0.4 MG SL TABLET     Place 1 tablet (0.4 mg total) under the tongue every 5 (five) minutes x 3 doses as needed for chest pain.  Modified Medications   No medications on file  Discontinued Medications   CITALOPRAM (CELEXA) 20 MG TABLET    Take 40 mg by mouth daily.    LISINOPRIL (PRINIVIL,ZESTRIL) 10 MG TABLET    Take 1 tablet (10 mg total) by mouth daily.     Physical Exam:  Filed Vitals:   05/24/14 1359  BP: 151/76  Pulse: 62  Temp: 97.6 F (36.4 C)  Resp: 16  Weight: 107 lb (48.535 kg)  SpO2: 96%    Physical Exam  Constitutional: She is oriented to person, place, and time. No distress.  Frail, elderly  HENT:  Head: Normocephalic and atraumatic.  Eyes: Pupils are equal, round, and reactive to light.  Neck: Normal range of motion. Neck supple. No JVD present. No tracheal deviation present.  Cardiovascular: Normal rate and regular rhythm.   Pulmonary/Chest: Effort normal and breath sounds normal. No respiratory distress. She has no wheezes.  Abdominal: Soft. Bowel sounds are normal. She exhibits no distension. There is no tenderness.  Musculoskeletal:  Right foot drop. Strength 4/5 to BUE and BLE  Lymphadenopathy:    She has no cervical adenopathy.  Neurological: She is alert and oriented to person, place, and time. No cranial nerve deficit.  Skin: Skin is warm and dry. She is not diaphoretic.    Labs reviewed/Significant Diagnostic Results:  Basic Metabolic Panel:  Recent Labs  11/21/13 0443 11/22/13 0500 11/23/13 0723 11/30/13 02/01/14  NA 138 138 139 140 140  K 3.6* 3.6* 3.8 3.5 3.5  CL 105 100 102  --   --   CO2 22 22 23   --   --   GLUCOSE 101* 104* 103*  --   --   BUN 14 15 14 16 16   CREATININE 0.86 0.74 0.68 0.8 0.8  CALCIUM 8.6 8.6 8.8  --   --    Liver Function Tests:  Recent Labs  11/18/13 0427 11/30/13  AST 34 20  ALT 15 14  ALKPHOS 72 123  BILITOT 0.4  --   PROT 5.2*  --   ALBUMIN 2.7*  --    No results for input(s): LIPASE, AMYLASE in the last 8760  hours. No results for input(s): AMMONIA in the last 8760 hours. CBC:  Recent Labs  11/16/13 1849  11/21/13 0443 11/22/13 0500 11/23/13 0723 11/30/13 03/22/14  WBC 12.5*  < > 6.4 9.3 9.2 7.7 6.4  NEUTROABS 10.3*  --   --   --   --   --   --   HGB 10.9*  < > 7.9* 9.7* 9.5* 9.8* 10.5*  HCT 33.2*  < > 23.9* 28.2* 28.2* 30* 32*  MCV 90.0  < > 89.5 88.7 89.0  --   --   PLT 241  < > 204  314 314 526* 278  < > = values in this interval not displayed. CBG: No results for input(s): GLUCAP in the last 8760 hours. TSH: No results for input(s): TSH in the last 8760 hours. A1C: Lab Results  Component Value Date   HGBA1C 6.1* 08/10/2012    Assessment/Plan HTN (hypertension) Increase Lisinopril 40 mg, check BMP in 2 weeks.  Pulmonary embolism, Jan 2012 No new events, denies easy bleeding or bruising. Continue Eliquis and monitor.  Osteoarthritis Intermittent pain to the left knee. Continue using walker and prn vicodin. Avoid NSAIDS  Anemia Hg stable on ferrous sulfate. Continue current regimen and monitor.  Generalized anxiety disorder Continues to report anxiety. Continue Ativan and buspirone. Consider increasing Buspirone at the next visit.  Osteoporosis Not currently on meds. Hx of low Vit D level. High fall risk. Check Vit D level with next blood draw    Cindi Carbon, ANP [facility] [phone]

## 2014-05-24 NOTE — Assessment & Plan Note (Signed)
Not currently on meds. Hx of low Vit D level. High fall risk. Check Vit D level with next blood draw

## 2014-05-24 NOTE — Assessment & Plan Note (Signed)
Intermittent pain to the left knee. Continue using walker and prn vicodin. Avoid NSAIDS

## 2014-05-24 NOTE — Assessment & Plan Note (Signed)
No new events, denies easy bleeding or bruising. Continue Eliquis and monitor.

## 2014-06-09 ENCOUNTER — Encounter (HOSPITAL_COMMUNITY): Payer: Self-pay | Admitting: Cardiovascular Disease

## 2014-06-13 ENCOUNTER — Ambulatory Visit: Payer: Medicare Other | Admitting: Physical Medicine & Rehabilitation

## 2014-06-16 LAB — CBC AND DIFFERENTIAL
HCT: 38 % (ref 36–46)
HEMOGLOBIN: 12.9 g/dL (ref 12.0–16.0)
Platelets: 235 10*3/uL (ref 150–399)
WBC: 6 10^3/mL

## 2014-06-16 LAB — BASIC METABOLIC PANEL
BUN: 22 mg/dL — AB (ref 4–21)
Creatinine: 0.9 mg/dL (ref 0.5–1.1)
Glucose: 78 mg/dL
Potassium: 4.4 mmol/L (ref 3.4–5.3)
Sodium: 139 mmol/L (ref 137–147)

## 2014-06-27 ENCOUNTER — Non-Acute Institutional Stay (SKILLED_NURSING_FACILITY): Payer: Medicare Other | Admitting: Adult Health

## 2014-06-27 DIAGNOSIS — I2699 Other pulmonary embolism without acute cor pulmonale: Secondary | ICD-10-CM

## 2014-06-27 DIAGNOSIS — I10 Essential (primary) hypertension: Secondary | ICD-10-CM

## 2014-06-27 DIAGNOSIS — E559 Vitamin D deficiency, unspecified: Secondary | ICD-10-CM

## 2014-06-27 DIAGNOSIS — M21371 Foot drop, right foot: Secondary | ICD-10-CM

## 2014-06-27 DIAGNOSIS — F411 Generalized anxiety disorder: Secondary | ICD-10-CM

## 2014-06-27 DIAGNOSIS — E876 Hypokalemia: Secondary | ICD-10-CM | POA: Insufficient documentation

## 2014-06-27 NOTE — Assessment & Plan Note (Signed)
No new events, continue eliquis and monitor

## 2014-06-27 NOTE — Assessment & Plan Note (Signed)
Her BP has been labile, ranging 110-175/59-74.  I have asked the staff to check the BP manually for 1 week and report in the [facility] notebook.

## 2014-06-27 NOTE — Progress Notes (Signed)
Patient ID: Cathy Wallace, female   DOB: October 09, 1916, 80 y.o.   MRN: [id number]    Patient ID: Cathy Wallace, female   DOB: 1917-02-18, 80 y.o.   MRN: [id number]  Nursing Home Location:  [place] of Service: SNF [phone])  Chief Complaint  Patient presents with  . Medical Management of Chronic Issues    HPI:  80 y.o.  residing at [facility]. I am here to review her chronic medical issues.  Her weight has decreased by 5 lbs since our last visit to 102.8 lbs. She recently moved to skilled care and has experienced some anxiety with the change and there has been a change in her dining routine. She has no associated GI issues. She has chronic anxiety and continues to have symptoms occasionally and is scared of losing control of her life.  Her BP has been elevated the past month and her lisinopril was increased to 40mg  with some improvement but numbers are still above goal. (up tp 657 systolic).  She has not had any CP, SOB, or leg swelling.  Review of Systems:  Review of Systems  Constitutional: Positive for fatigue. Negative for fever, chills, diaphoresis, activity change, appetite change and unexpected weight change.       Frail, elderly  HENT: Positive for hearing loss (Hearing aid right ear). Negative for congestion, dental problem, drooling, ear discharge, mouth sores, rhinorrhea and sinus pressure.   Eyes: Negative.   Respiratory: Negative for cough, choking, shortness of breath, wheezing and stridor.   Cardiovascular: Negative for chest pain, palpitations and leg swelling.  Gastrointestinal: Negative for nausea, diarrhea, constipation and abdominal distention.  Endocrine: Negative.   Genitourinary: Negative for dysuria and difficulty urinating.  Musculoskeletal: Positive for arthralgias and gait problem. Negative for myalgias, back pain and joint swelling.       Uses walker  Skin: Negative for color change, pallor and rash.    Allergic/Immunologic: Negative for environmental allergies, food allergies and immunocompromised state.  Neurological: Negative for dizziness, tremors, seizures, syncope, speech difficulty, weakness, light-headedness, numbness and headaches.  Hematological: Negative for adenopathy. Does not bruise/bleed easily.       Chronic anemia which is normocytic.  Psychiatric/Behavioral: Negative for suicidal ideas, hallucinations, behavioral problems, confusion, sleep disturbance, self-injury, dysphoric mood, decreased concentration and agitation. The patient is nervous/anxious. The patient is not hyperactive.     Medications: Patient's Medications  New Prescriptions   No medications on file  Previous Medications   ACETAMINOPHEN (TYLENOL) 325 MG TABLET    Take 2 tablets (650 mg total) by mouth 3 (three) times daily.   APIXABAN (ELIQUIS) 2.5 MG TABS TABLET    Take 1 tablet (2.5 mg total) by mouth 2 (two) times daily.   BUSPIRONE (BUSPAR) 15 MG TABLET    Take 15 mg by mouth 3 (three) times daily.   CHOLECALCIFEROL (VITAMIN D) 1000 UNITS TABLET    Take 2,000 Units by mouth daily.   CITALOPRAM (CELEXA) 40 MG TABLET    Take 40 mg by mouth daily.   DILTIAZEM (CARDIZEM CD) 180 MG 24 HR CAPSULE    Take 1 capsule (180 mg total) by mouth daily after lunch.   DOXAZOSIN (CARDURA) 8 MG TABLET    Take 8 mg by mouth daily.   FERROUS SULFATE 325 (65 FE) MG TABLET    Take 1 tablet (325 mg total) by mouth daily with breakfast.   HYDROCODONE-ACETAMINOPHEN (NORCO/VICODIN) 5-325 MG PER TABLET    Take 1 tablet  by mouth every 6 (six) hours as needed for severe pain.   LISINOPRIL (PRINIVIL,ZESTRIL) 40 MG TABLET    Take 40 mg by mouth daily.   LOPERAMIDE (IMODIUM A-D) 2 MG TABLET    Take 2 mg by mouth 4 (four) times daily as needed for diarrhea or loose stools.   LORATADINE (CLARITIN) 10 MG TABLET    Take 10 mg by mouth daily as needed for allergies.    LORAZEPAM (ATIVAN) 1 MG TABLET    Take 0.5-2 mg by mouth 2 (two) times  daily. Takes 0.5mg  as needed, 1mg  in am, 2mg  at bedtime   MELATONIN 3 MG TABS    Take 1 tablet by mouth at bedtime.   NITROGLYCERIN (NITROSTAT) 0.4 MG SL TABLET    Place 1 tablet (0.4 mg total) under the tongue every 5 (five) minutes x 3 doses as needed for chest pain.   POTASSIUM CHLORIDE SA (K-DUR,KLOR-CON) 20 MEQ TABLET    Take 20 mEq by mouth once.  Modified Medications   No medications on file  Discontinued Medications   LISINOPRIL (PRINIVIL,ZESTRIL) 20 MG TABLET    Take 20 mg by mouth daily.     Physical Exam:  Filed Vitals:   06/27/14 1206  BP: 155/70  Pulse: 66  Temp: 98.6 F (37 C)  Resp: 20  Weight: 102 lb 12.8 oz (46.63 kg)  SpO2: 97%    Physical Exam  Constitutional: She is oriented to person, place, and time. No distress.  Frail, elderly  HENT:  Head: Normocephalic and atraumatic.  Eyes: Pupils are equal, round, and reactive to light.  Neck: Normal range of motion. Neck supple. No JVD present. No tracheal deviation present.  Cardiovascular: Normal rate and regular rhythm.   Pulmonary/Chest: Effort normal and breath sounds normal. No respiratory distress. She has no wheezes.  Abdominal: Soft. Bowel sounds are normal. She exhibits no distension. There is no tenderness.  Musculoskeletal:  Right foot drop. Strength 4/5 to BUE and BLE  Lymphadenopathy:    She has no cervical adenopathy.  Neurological: She is alert and oriented to person, place, and time. No cranial nerve deficit.  Skin: Skin is warm and dry. She is not diaphoretic.    Labs reviewed/Significant Diagnostic Results:  Basic Metabolic Panel:  Recent Labs  11/21/13 0443 11/22/13 0500 11/23/13 0723 11/30/13 02/01/14 06/16/14  NA 138 138 139 140 140 139  K 3.6* 3.6* 3.8 3.5 3.5 4.4  CL 105 100 102  --   --   --   CO2 22 22 23   --   --   --   GLUCOSE 101* 104* 103*  --   --   --   BUN 14 15 14 16 16  22*  CREATININE 0.86 0.74 0.68 0.8 0.8 0.9  CALCIUM 8.6 8.6 8.8  --   --   --    Liver  Function Tests:  Recent Labs  11/18/13 0427 11/30/13  AST 34 20  ALT 15 14  ALKPHOS 72 123  BILITOT 0.4  --   PROT 5.2*  --   ALBUMIN 2.7*  --    No results for input(s): LIPASE, AMYLASE in the last 8760 hours. No results for input(s): AMMONIA in the last 8760 hours. CBC:  Recent Labs  11/16/13 1849  11/21/13 0443 11/22/13 0500 11/23/13 0723 11/30/13 03/22/14 06/16/14  WBC 12.5*  < > 6.4 9.3 9.2 7.7 6.4 6.0  NEUTROABS 10.3*  --   --   --   --   --   --   --  HGB 10.9*  < > 7.9* 9.7* 9.5* 9.8* 10.5* 12.9  HCT 33.2*  < > 23.9* 28.2* 28.2* 30* 32* 38  MCV 90.0  < > 89.5 88.7 89.0  --   --   --   PLT 241  < > 204 314 314 526* 278 235  < > = values in this interval not displayed. CBG: No results for input(s): GLUCAP in the last 8760 hours. TSH: No results for input(s): TSH in the last 8760 hours. A1C: Lab Results  Component Value Date   HGBA1C 6.1* 08/10/2012    Assessment/Plan HTN (hypertension) Her BP has been labile, ranging 110-175/59-74.  I have asked the staff to check the BP manually for 1 week and report in the [facility] notebook.   Hypokalemia Started on [facility] on 06/03/14 for a K of 3.0.  Now 4.4 as of 06/16/14.  Since the ace was recently increased will recheck BMP in 2 weeks  Right foot drop An apt is pending with bio tech for a brace. She denies pain but continues to report that she can not bear weight well to her right foot. Seen by a rehab doctor. ? L5 radiculopathy   Generalized anxiety disorder Continues with some periods of anxiety but improved from last month. Continue current regimen.  Vitamin D deficiency Levels still low at 20, increase Vit D to 50,000 units qwk for 6 weeks, then 1000 qd, recheck level in 6 weeks.  Pulmonary embolism, Jan 2012 No new events, continue eliquis and monitor    Cindi Carbon, [place] [phone]

## 2014-06-27 NOTE — Assessment & Plan Note (Signed)
Started on [facility] on 06/03/14 for a K of 3.0.  Now 4.4 as of 06/16/14.  Since the ace was recently increased will recheck BMP in 2 weeks

## 2014-06-27 NOTE — Assessment & Plan Note (Addendum)
An apt is pending with bio tech for a brace. She denies pain but continues to report that she can not bear weight well to her right foot. Seen by a rehab doctor. ? L5 radiculopathy

## 2014-06-27 NOTE — Assessment & Plan Note (Signed)
Continues with some periods of anxiety but improved from last month. Continue current regimen.

## 2014-06-27 NOTE — Assessment & Plan Note (Addendum)
Levels still low at 20, increase Vit D to 50,000 units qwk for 6 weeks, then 1000 qd, recheck level in 6 weeks.

## 2014-07-05 LAB — BASIC METABOLIC PANEL
BUN: 23 mg/dL — AB (ref 4–21)
CREATININE: 0.9 mg/dL (ref 0.5–1.1)
Glucose: 74 mg/dL
POTASSIUM: 4.3 mmol/L (ref 3.4–5.3)
SODIUM: 140 mmol/L (ref 137–147)

## 2014-07-28 ENCOUNTER — Non-Acute Institutional Stay (SKILLED_NURSING_FACILITY): Payer: Medicare Other | Admitting: Adult Health

## 2014-07-28 ENCOUNTER — Encounter: Payer: Self-pay | Admitting: Adult Health

## 2014-07-28 DIAGNOSIS — G4700 Insomnia, unspecified: Secondary | ICD-10-CM

## 2014-07-28 DIAGNOSIS — M810 Age-related osteoporosis without current pathological fracture: Secondary | ICD-10-CM

## 2014-07-28 DIAGNOSIS — D649 Anemia, unspecified: Secondary | ICD-10-CM

## 2014-07-28 DIAGNOSIS — K219 Gastro-esophageal reflux disease without esophagitis: Secondary | ICD-10-CM

## 2014-07-28 DIAGNOSIS — I2699 Other pulmonary embolism without acute cor pulmonale: Secondary | ICD-10-CM

## 2014-07-28 DIAGNOSIS — M21371 Foot drop, right foot: Secondary | ICD-10-CM

## 2014-07-28 DIAGNOSIS — F411 Generalized anxiety disorder: Secondary | ICD-10-CM

## 2014-07-28 DIAGNOSIS — I10 Essential (primary) hypertension: Secondary | ICD-10-CM

## 2014-07-28 DIAGNOSIS — E876 Hypokalemia: Secondary | ICD-10-CM

## 2014-07-28 NOTE — Progress Notes (Signed)
Patient ID: Cathy Wallace, female   DOB: 04-Nov-1916, 81 y.o.   MRN: [id number]  Nursing Home Location:  [place] of Service: SNF [phone])  Chief Complaint  Patient presents with  . Medical Management of Chronic Issues    HPI:  81 y.o. female residing at [facility], skilled care section. I am here to review her chronic medical issues.  Her weight has remained stable since our last visit at 102.6lbs, but there was a 5lb loss last month. She recently moved to skilled care and has begun to adjust to the change.  She has a hx of foot drop and has been fitted for an AFO. She uses a walker and needs assistance to get OOB.  She reports that there is some friction to her foot with the orthotic device. She is working with therapy on this issue. She reported indigestion and was placed on Prilosec with improvement in symptoms.   Review of Systems:  Review of Systems  Constitutional: Negative for fever, chills, diaphoresis, activity change, appetite change, fatigue and unexpected weight change.       Frail, elderly  HENT: Positive for hearing loss (Hearing aid right ear). Negative for congestion, dental problem, drooling, ear discharge, mouth sores, rhinorrhea and sinus pressure.   Eyes: Negative.   Respiratory: Negative for cough, choking, shortness of breath, wheezing and stridor.   Cardiovascular: Negative for chest pain, palpitations and leg swelling.  Gastrointestinal: Negative for nausea, diarrhea, constipation and abdominal distention.  Endocrine: Negative.   Genitourinary: Negative for dysuria and difficulty urinating.  Musculoskeletal: Positive for arthralgias and gait problem. Negative for myalgias, back pain and joint swelling.       Uses walker  Skin: Negative for color change, pallor and rash.  Allergic/Immunologic: Negative for environmental allergies, food allergies and immunocompromised state.  Neurological: Negative for  dizziness, tremors, seizures, syncope, speech difficulty, weakness, light-headedness, numbness and headaches.  Hematological: Negative for adenopathy. Does not bruise/bleed easily.       Chronic anemia which is normocytic.  Psychiatric/Behavioral: Negative for suicidal ideas, hallucinations, behavioral problems, confusion, sleep disturbance, self-injury, dysphoric mood, decreased concentration and agitation. The patient is nervous/anxious. The patient is not hyperactive.     Medications: Patient's Medications  New Prescriptions   No medications on file  Previous Medications   ACETAMINOPHEN (TYLENOL) 325 MG TABLET    Take 2 tablets (650 mg total) by mouth 3 (three) times daily.   APIXABAN (ELIQUIS) 2.5 MG TABS TABLET    Take 1 tablet (2.5 mg total) by mouth 2 (two) times daily.   BUSPIRONE (BUSPAR) 15 MG TABLET    Take 15 mg by mouth 3 (three) times daily.   CITALOPRAM (CELEXA) 40 MG TABLET    Take 40 mg by mouth daily.   DILTIAZEM (CARDIZEM CD) 180 MG 24 HR CAPSULE    Take 1 capsule (180 mg total) by mouth daily after lunch.   DOXAZOSIN (CARDURA) 8 MG TABLET    Take 8 mg by mouth daily.   FERROUS SULFATE 325 (65 FE) MG TABLET    Take 1 tablet (325 mg total) by mouth daily with breakfast.   FLUTICASONE (VERAMYST) 27.5 MCG/SPRAY NASAL SPRAY    Place 2 sprays into the nose daily.   HYDROCODONE-ACETAMINOPHEN (NORCO/VICODIN) 5-325 MG PER TABLET    Take 1 tablet by mouth every 6 (six) hours as needed for severe pain.   LISINOPRIL (PRINIVIL,ZESTRIL) 40 MG TABLET    Take 40 mg by  mouth daily.   LOPERAMIDE (IMODIUM A-D) 2 MG TABLET    Take 2 mg by mouth 4 (four) times daily as needed for diarrhea or loose stools.   LORATADINE (CLARITIN) 10 MG TABLET    Take 10 mg by mouth daily as needed for allergies.    LORAZEPAM (ATIVAN) 1 MG TABLET    Take 0.5-2 mg by mouth 2 (two) times daily. Takes 0.5mg  as needed, 1mg  in am, 2mg  at bedtime   MELATONIN 3 MG TABS    Take 1 tablet by mouth at bedtime.    NITROGLYCERIN (NITROSTAT) 0.4 MG SL TABLET    Place 1 tablet (0.4 mg total) under the tongue every 5 (five) minutes x 3 doses as needed for chest pain.   OMEPRAZOLE (PRILOSEC) 20 MG CAPSULE    Take 20 mg by mouth daily.   POTASSIUM CHLORIDE SA (K-DUR,KLOR-CON) 20 MEQ TABLET    Take 20 mEq by mouth once.   VITAMIN D, ERGOCALCIFEROL, (DRISDOL) 50000 UNITS CAPS CAPSULE    Take 50,000 Units by mouth every 7 (seven) days.  Modified Medications   No medications on file  Discontinued Medications   CHOLECALCIFEROL (VITAMIN D) 1000 UNITS TABLET    Take 2,000 Units by mouth daily.     Physical Exam:  Filed Vitals:   07/28/14 1147  BP: 164/70  Pulse: 58  Temp: 97.6 F (36.4 C)  Resp: 18  Weight: 102 lb 9.6 oz (46.539 kg)  SpO2: 95%    Physical Exam  Constitutional: She is oriented to person, place, and time. No distress.  Frail, elderly  HENT:  Head: Normocephalic and atraumatic.  Eyes: Pupils are equal, round, and reactive to light.  Neck: Normal range of motion. Neck supple. No JVD present. No tracheal deviation present.  Cardiovascular: Normal rate and regular rhythm.   Pulmonary/Chest: Effort normal and breath sounds normal. No respiratory distress. She has no wheezes.  Abdominal: Soft. Bowel sounds are normal. She exhibits no distension. There is no tenderness.  Musculoskeletal:  Right foot drop. Atrophy noted to right calf muscle.  Lymphadenopathy:    She has no cervical adenopathy.  Neurological: She is alert and oriented to person, place, and time. No cranial nerve deficit.  Skin: Skin is warm and dry. She is not diaphoretic.    Labs reviewed/Significant Diagnostic Results:  Basic Metabolic Panel:  Recent Labs  11/21/13 0443 11/22/13 0500 11/23/13 0723  02/01/14 06/16/14 07/05/14  NA 138 138 139  < > 140 139 140  K 3.6* 3.6* 3.8  < > 3.5 4.4 4.3  CL 105 100 102  --   --   --   --   CO2 22 22 23   --   --   --   --   GLUCOSE 101* 104* 103*  --   --   --   --   BUN  14 15 14   < > 16 22* 23*  CREATININE 0.86 0.74 0.68  < > 0.8 0.9 0.9  CALCIUM 8.6 8.6 8.8  --   --   --   --   < > = values in this interval not displayed. Liver Function Tests:  Recent Labs  11/18/13 0427 11/30/13  AST 34 20  ALT 15 14  ALKPHOS 72 123  BILITOT 0.4  --   PROT 5.2*  --   ALBUMIN 2.7*  --    No results for input(s): LIPASE, AMYLASE in the last 8760 hours. No results for input(s): AMMONIA in the last  8760 hours. CBC:  Recent Labs  11/16/13 1849  11/21/13 0443 11/22/13 0500 11/23/13 0723 11/30/13 03/22/14 06/16/14  WBC 12.5*  < > 6.4 9.3 9.2 7.7 6.4 6.0  NEUTROABS 10.3*  --   --   --   --   --   --   --   HGB 10.9*  < > 7.9* 9.7* 9.5* 9.8* 10.5* 12.9  HCT 33.2*  < > 23.9* 28.2* 28.2* 30* 32* 38  MCV 90.0  < > 89.5 88.7 89.0  --   --   --   PLT 241  < > 204 314 314 526* 278 235  < > = values in this interval not displayed. CBG: No results for input(s): GLUCAP in the last 8760 hours. TSH: No results for input(s): TSH in the last 8760 hours. A1C: Lab Results  Component Value Date   HGBA1C 6.1* 08/10/2012   06/16/14: Iron 69, TIBC 299, %sat 23, VIt D 20  2D echo 2014 Study Conclusions  - Left ventricle: Systolic function was normal. The estimated ejection fraction was in the range of 55% to 60%. Although no diagnostic regional wall motion abnormality was identified, this possibility cannot be completely excluded on the basis of this study. Doppler parameters are consistent with abnormal left ventricular relaxation (grade 1 diastolic dysfunction). - Ventricular septum: The contour showed diastolic flattening. These changes are consistent with RV volume overload. - Aortic valve: Trivial regurgitation. - Mitral valve: Calcified annulus. - Right ventricle: The cavity size was moderately dilated. Systolic function was mildly reduced. - Right atrium: The atrium was mildly dilated. - Atrial septum: A septal defect cannot be  excluded. - Tricuspid valve: Moderate-severe regurgitation directed centrally. - Pulmonary arteries: PA peak pressure: 46mm Hg (S).  Assessment/Plan Pulmonary embolism, Jan 2012 No new events, continue Eliquis and monitor.   HTN (hypertension) BPs recorded in records are elevated, but when manuals are performed daily her BP is within acceptable range.   Continue current meds and monitor.   GERD (gastroesophageal reflux disease) Reported indigestion and started on Prilosec 20mg  daily with relief. Continue current dose.   Osteoporosis Currently on Vitamin D 50,000 units due to low levels. Continue current dose, level due mid Feb   Insomnia Stable, reports sleeping well with melatonin supplements   Right foot drop Stable, wearing AFO but reports that it is uncomfortable and she is working with therapy on this issue. She reports that there is some friction with the brace and I have asked the staff to provide a pad for her foot. She continues to require assistance with ambulation.    Hypokalemia Currently on 20MEQ of Kdur. Last K 4.3.  Reduce Kdur to 10 MEQ daily and recheck BMP, monitor K closely, pt on ACE also.   Generalized anxiety disorder Stable. Continue Ativan and Buspar. Continues to verbalize anxiety when she does not have a caretaker with her or a change in routine.   Anemia Hgb stable on ferrous sulfate. Continue to monitor CBC periodically. Anemia found after a pelvic fx. Will consider discontinuing iron at next visit.   Continue to monitor weight and intake.  Cindi Carbon, ANP [facility] [phone]

## 2014-07-28 NOTE — Assessment & Plan Note (Addendum)
Currently on 20MEQ of Kdur. Last K 4.3.  Reduce Kdur to 10 MEQ daily and recheck BMP, monitor K closely, pt on ACE also.

## 2014-07-28 NOTE — Assessment & Plan Note (Addendum)
Hgb stable on ferrous sulfate. Continue to monitor CBC periodically. Anemia found after a pelvic fx. Will consider discontinuing iron at next visit.

## 2014-07-28 NOTE — Assessment & Plan Note (Signed)
Currently on Vitamin D 50,000 units due to low levels. Continue current dose, level due mid Feb

## 2014-07-28 NOTE — Assessment & Plan Note (Signed)
No new events, continue Eliquis and monitor.

## 2014-07-28 NOTE — Assessment & Plan Note (Signed)
Stable, wearing AFO but reports that it is uncomfortable and she is working with therapy on this issue. She reports that there is some friction with the brace and I have asked the staff to provide a pad for her foot. She continues to require assistance with ambulation.

## 2014-07-28 NOTE — Assessment & Plan Note (Signed)
Reported indigestion and started on Prilosec 20mg  daily with relief. Continue current dose.

## 2014-07-28 NOTE — Assessment & Plan Note (Signed)
BPs recorded in records are elevated, but when manuals are performed daily her BP is within acceptable range.   Continue current meds and monitor.

## 2014-07-28 NOTE — Assessment & Plan Note (Signed)
Stable, reports sleeping well with melatonin supplements

## 2014-07-28 NOTE — Assessment & Plan Note (Signed)
Stable. Continue Ativan and Buspar. Continues to verbalize anxiety when she does not have a caretaker with her or a change in routine.

## 2014-08-02 LAB — BASIC METABOLIC PANEL
BUN: 17 mg/dL (ref 4–21)
Creatinine: 0.9 mg/dL (ref 0.5–1.1)
Glucose: 84 mg/dL
Potassium: 3.8 mmol/L (ref 3.4–5.3)
Sodium: 140 mmol/L (ref 137–147)

## 2014-08-22 ENCOUNTER — Non-Acute Institutional Stay (SKILLED_NURSING_FACILITY): Payer: Medicare Other | Admitting: Adult Health

## 2014-08-22 DIAGNOSIS — N3281 Overactive bladder: Secondary | ICD-10-CM | POA: Diagnosis not present

## 2014-08-22 DIAGNOSIS — F411 Generalized anxiety disorder: Secondary | ICD-10-CM | POA: Diagnosis not present

## 2014-08-26 ENCOUNTER — Encounter: Payer: Self-pay | Admitting: Adult Health

## 2014-08-26 NOTE — Progress Notes (Signed)
Patient ID: Cathy Wallace, female   DOB: Sep 27, 1916, 81 y.o.   MRN: [id number]     Nursing Home Location:  [place] of Service: SNF [phone])  Chief Complaint  Patient presents with  . Acute Visit    incontinence    HPI:  81 y.o. female residing at [facility], skilled care section. I was asked to see her today due to reports of an overactive bladder that have been present for 2-3 days, only noted at night. She denies any dysuria, fever, flank pain, or abd pain. She reports that at night starting at 1am she urinates on her self every hr until she gets up in the morning at 6am. She has a hx of anxiety and uses ativan on a regular basis and feels that this may be a contributing factor. She feels the urge to void immediately and then urine begin to flow and she can not wait for help to come. She reports that she has been drinking ginger ale at night before bed. She also has an oz. of scotch every evening for 40 + years. During the day she remains continent most of the time.   Review of Systems:  Review of Systems  Constitutional: Negative for fever, chills, diaphoresis, activity change, appetite change, fatigue and unexpected weight change.       Frail, elderly  HENT: Positive for hearing loss (Hearing aid right ear).   Eyes: Negative.   Gastrointestinal: Negative for nausea, diarrhea, constipation and abdominal distention.  Endocrine: Negative.   Genitourinary: Positive for urgency and frequency. Negative for dysuria, hematuria, flank pain, vaginal bleeding, vaginal discharge, difficulty urinating and pelvic pain.       Increase incontinence  Musculoskeletal: Positive for arthralgias and gait problem. Negative for myalgias, back pain and joint swelling.       Uses walker  Skin: Negative for color change, pallor and rash.  Hematological:       Chronic anemia which is normocytic.  Psychiatric/Behavioral: Negative for suicidal ideas,  hallucinations, behavioral problems, confusion, sleep disturbance, self-injury, dysphoric mood, decreased concentration and agitation. The patient is nervous/anxious. The patient is not hyperactive.     Medications: Patient's Medications  New Prescriptions   No medications on file  Previous Medications   ACETAMINOPHEN (TYLENOL) 325 MG TABLET    Take 2 tablets (650 mg total) by mouth 3 (three) times daily.   APIXABAN (ELIQUIS) 2.5 MG TABS TABLET    Take 1 tablet (2.5 mg total) by mouth 2 (two) times daily.   BUSPIRONE (BUSPAR) 15 MG TABLET    Take 15 mg by mouth 3 (three) times daily.   CITALOPRAM (CELEXA) 40 MG TABLET    Take 40 mg by mouth daily.   DILTIAZEM (CARDIZEM CD) 180 MG 24 HR CAPSULE    Take 1 capsule (180 mg total) by mouth daily after lunch.   DOXAZOSIN (CARDURA) 8 MG TABLET    Take 8 mg by mouth daily.   FERROUS SULFATE 325 (65 FE) MG TABLET    Take 1 tablet (325 mg total) by mouth daily with breakfast.   FLUTICASONE (VERAMYST) 27.5 MCG/SPRAY NASAL SPRAY    Place 2 sprays into the nose daily.   HYDROCODONE-ACETAMINOPHEN (NORCO/VICODIN) 5-325 MG PER TABLET    Take 1 tablet by mouth every 6 (six) hours as needed for severe pain.   LISINOPRIL (PRINIVIL,ZESTRIL) 40 MG TABLET    Take 40 mg by mouth daily.   LOPERAMIDE (IMODIUM A-D) 2 MG TABLET  Take 2 mg by mouth 4 (four) times daily as needed for diarrhea or loose stools.   LORATADINE (CLARITIN) 10 MG TABLET    Take 10 mg by mouth daily as needed for allergies.    LORAZEPAM (ATIVAN) 1 MG TABLET    Take 0.5-2 mg by mouth 2 (two) times daily. Takes 0.5mg  as needed, 1mg  in am, 2mg  at bedtime   MELATONIN 3 MG TABS    Take 1 tablet by mouth at bedtime.   NITROGLYCERIN (NITROSTAT) 0.4 MG SL TABLET    Place 1 tablet (0.4 mg total) under the tongue every 5 (five) minutes x 3 doses as needed for chest pain.   OMEPRAZOLE (PRILOSEC) 20 MG CAPSULE    Take 20 mg by mouth daily.   POTASSIUM CHLORIDE SA (K-DUR,KLOR-CON) 20 MEQ TABLET    Take 20  mEq by mouth once.   VITAMIN D, ERGOCALCIFEROL, (DRISDOL) 50000 UNITS CAPS CAPSULE    Take 50,000 Units by mouth every 7 (seven) days.  Modified Medications   No medications on file  Discontinued Medications   No medications on file     Physical Exam:  Filed Vitals:   08/22/14 1310  BP: 150/72  Pulse: 72  Temp: 97.1 F (36.2 C)  Resp: 18  SpO2: 97%    Physical Exam  Constitutional: She is oriented to person, place, and time. No distress.  Frail, elderly  HENT:  Head: Normocephalic and atraumatic.  Eyes: Pupils are equal, round, and reactive to light.  Neck: Normal range of motion. Neck supple. No JVD present. No tracheal deviation present.  Cardiovascular: Normal rate and regular rhythm.   Pulmonary/Chest: Effort normal and breath sounds normal. No respiratory distress. She has no wheezes.  Abdominal: Soft. Bowel sounds are normal. She exhibits no distension. There is no tenderness.  No CVA tenderness or s/p tendneress  Musculoskeletal:  Right foot drop. Atrophy noted to right calf muscle.  Lymphadenopathy:    She has no cervical adenopathy.  Neurological: She is alert and oriented to person, place, and time. No cranial nerve deficit.  Skin: Skin is warm and dry. She is not diaphoretic.  Psychiatric: Affect normal.    Labs reviewed/Significant Diagnostic Results:  Basic Metabolic Panel:  Recent Labs  11/21/13 0443 11/22/13 0500 11/23/13 0723  02/01/14 06/16/14 07/05/14  NA 138 138 139  < > 140 139 140  K 3.6* 3.6* 3.8  < > 3.5 4.4 4.3  CL 105 100 102  --   --   --   --   CO2 22 22 23   --   --   --   --   GLUCOSE 101* 104* 103*  --   --   --   --   BUN 14 15 14   < > 16 22* 23*  CREATININE 0.86 0.74 0.68  < > 0.8 0.9 0.9  CALCIUM 8.6 8.6 8.8  --   --   --   --   < > = values in this interval not displayed. Liver Function Tests:  Recent Labs  11/18/13 0427 11/30/13  AST 34 20  ALT 15 14  ALKPHOS 72 123  BILITOT 0.4  --   PROT 5.2*  --   ALBUMIN 2.7*   --    No results for input(s): LIPASE, AMYLASE in the last 8760 hours. No results for input(s): AMMONIA in the last 8760 hours. CBC:  Recent Labs  11/16/13 1849  11/21/13 0443 11/22/13 0500 11/23/13 0723 11/30/13 03/22/14 06/16/14  WBC  12.5*  < > 6.4 9.3 9.2 7.7 6.4 6.0  NEUTROABS 10.3*  --   --   --   --   --   --   --   HGB 10.9*  < > 7.9* 9.7* 9.5* 9.8* 10.5* 12.9  HCT 33.2*  < > 23.9* 28.2* 28.2* 30* 32* 38  MCV 90.0  < > 89.5 88.7 89.0  --   --   --   PLT 241  < > 204 314 314 526* 278 235  < > = values in this interval not displayed. CBG: No results for input(s): GLUCAP in the last 8760 hours. TSH: No results for input(s): TSH in the last 8760 hours. A1C: Lab Results  Component Value Date   HGBA1C 6.1* 08/10/2012   06/16/14: Iron 69, TIBC 299, %sat 23, VIt D 20  2D echo 2014 Study Conclusions  - Left ventricle: Systolic function was normal. The estimated ejection fraction was in the range of 55% to 60%. Although no diagnostic regional wall motion abnormality was identified, this possibility cannot be completely excluded on the basis of this study. Doppler parameters are consistent with abnormal left ventricular relaxation (grade 1 diastolic dysfunction). - Ventricular septum: The contour showed diastolic flattening. These changes are consistent with RV volume overload. - Aortic valve: Trivial regurgitation. - Mitral valve: Calcified annulus. - Right ventricle: The cavity size was moderately dilated. Systolic function was mildly reduced. - Right atrium: The atrium was mildly dilated. - Atrial septum: A septal defect cannot be excluded. - Tricuspid valve: Moderate-severe regurgitation directed centrally. - Pulmonary arteries: PA peak pressure: 2mm Hg (S).  Assessment/Plan  1. Overactive bladder Begin bladder training exercises and kegal exercises. Avoid caffeine, carbonated beverages and ETOH before bed. If no improvement consider  pharmacologic therapy.  2. Generalized anxiety disorder I encouraged her to continue to call for help for voiding and not to worry about bothering the staff. She has caregivers and this seems to calm her during the day but her anxiety tends to increase at night when she is alone. She is aware of this and attempting to work on her state of mind.    Cindi Carbon, ANP [facility] [phone]

## 2014-08-30 ENCOUNTER — Encounter: Payer: Self-pay | Admitting: Adult Health

## 2014-08-30 ENCOUNTER — Non-Acute Institutional Stay (SKILLED_NURSING_FACILITY): Payer: Medicare Other | Admitting: Adult Health

## 2014-08-30 DIAGNOSIS — I10 Essential (primary) hypertension: Secondary | ICD-10-CM | POA: Diagnosis not present

## 2014-08-30 DIAGNOSIS — M21371 Foot drop, right foot: Secondary | ICD-10-CM

## 2014-08-30 DIAGNOSIS — I2699 Other pulmonary embolism without acute cor pulmonale: Secondary | ICD-10-CM

## 2014-08-30 DIAGNOSIS — M810 Age-related osteoporosis without current pathological fracture: Secondary | ICD-10-CM

## 2014-08-30 DIAGNOSIS — N3281 Overactive bladder: Secondary | ICD-10-CM | POA: Diagnosis not present

## 2014-08-30 DIAGNOSIS — E559 Vitamin D deficiency, unspecified: Secondary | ICD-10-CM

## 2014-08-30 DIAGNOSIS — E876 Hypokalemia: Secondary | ICD-10-CM

## 2014-08-30 NOTE — Progress Notes (Signed)
Patient ID: Cathy Wallace, female   DOB: 08-25-1916, 81 y.o.   MRN: [id number]    Nursing Home Location:  [place] of Service: SNF [id number])  Chief Complaint  Patient presents with  . Medical Management of Chronic Issues    HPI:  81 y.o. female residing at [facility], skilled care section. I am here to review her chronic medical issues.  She has a hx of DVT, PE, MI, CAD, GERD, OP, HTN, anemia, anxiety, foot drop, and multiple fractures.  Her weight has remained stable since our last visit at 105 lbs.  She reports that her incontinence has improved at night with reducing carbonated beverages but continues to wet the bed at times. She reports increased anxiety and insomnia because she does not have her sitter schedule yet. Any uncertainty in her routine seems to be a problem. She continues with a brace to her right foot for foot drop. Due to the fact that this has impaired her mobility she requires help to get OOB and was moved to skilled care for this reason.   Review of Systems:  Review of Systems  Constitutional: Negative for fever, chills, diaphoresis, activity change, appetite change, fatigue and unexpected weight change.       Frail, elderly  HENT: Positive for hearing loss (Hearing aid right ear). Negative for congestion, dental problem, drooling, ear discharge, mouth sores, rhinorrhea and sinus pressure.   Eyes: Negative.   Respiratory: Negative for cough, choking, shortness of breath, wheezing and stridor.   Cardiovascular: Negative for chest pain, palpitations and leg swelling.  Gastrointestinal: Negative for nausea, diarrhea, constipation and abdominal distention.  Endocrine: Negative.   Genitourinary: Positive for urgency and frequency. Negative for dysuria and difficulty urinating.       Incontinence  Musculoskeletal: Positive for arthralgias and gait problem. Negative for myalgias, back pain and joint swelling.       Uses  walker  Skin: Negative for color change, pallor and rash.  Allergic/Immunologic: Negative for environmental allergies, food allergies and immunocompromised state.  Neurological: Negative for dizziness, tremors, seizures, syncope, speech difficulty, weakness, light-headedness, numbness and headaches.  Hematological: Negative for adenopathy. Does not bruise/bleed easily.       Chronic anemia which is normocytic.  Psychiatric/Behavioral: Positive for sleep disturbance. Negative for suicidal ideas, hallucinations, behavioral problems, confusion, self-injury, dysphoric mood, decreased concentration and agitation. The patient is nervous/anxious. The patient is not hyperactive.     Medications: Patient's Medications  New Prescriptions   No medications on file  Previous Medications   ACETAMINOPHEN (TYLENOL) 325 MG TABLET    Take 2 tablets (650 mg total) by mouth 3 (three) times daily.   APIXABAN (ELIQUIS) 2.5 MG TABS TABLET    Take 1 tablet (2.5 mg total) by mouth 2 (two) times daily.   BUSPIRONE (BUSPAR) 15 MG TABLET    Take 15 mg by mouth 3 (three) times daily.   CALCIUM CARBONATE (TUMS - DOSED IN MG ELEMENTAL CALCIUM) 500 MG CHEWABLE TABLET    Chew 1 tablet by mouth 2 (two) times daily.   CITALOPRAM (CELEXA) 40 MG TABLET    Take 40 mg by mouth daily.   DILTIAZEM (CARDIZEM CD) 180 MG 24 HR CAPSULE    Take 1 capsule (180 mg total) by mouth daily after lunch.   DOXAZOSIN (CARDURA) 8 MG TABLET    Take 8 mg by mouth daily.   FERROUS SULFATE 325 (65 FE) MG TABLET    Take 1 tablet (325  mg total) by mouth daily with breakfast.   FLUTICASONE (VERAMYST) 27.5 MCG/SPRAY NASAL SPRAY    Place 2 sprays into the nose daily.   HYDROCODONE-ACETAMINOPHEN (NORCO/VICODIN) 5-325 MG PER TABLET    Take 1 tablet by mouth every 6 (six) hours as needed for severe pain.   LISINOPRIL (PRINIVIL,ZESTRIL) 40 MG TABLET    Take 40 mg by mouth daily.   LOPERAMIDE (IMODIUM A-D) 2 MG TABLET    Take 2 mg by mouth 4 (four) times daily  as needed for diarrhea or loose stools.   LORATADINE (CLARITIN) 10 MG TABLET    Take 10 mg by mouth daily as needed for allergies.    LORAZEPAM (ATIVAN) 1 MG TABLET    Take 0.5-2 mg by mouth 2 (two) times daily. Takes 0.5mg  as needed, 1mg  in am, 2mg  at bedtime   MELATONIN 3 MG TABS    Take 1 tablet by mouth at bedtime.   NITROGLYCERIN (NITROSTAT) 0.4 MG SL TABLET    Place 1 tablet (0.4 mg total) under the tongue every 5 (five) minutes x 3 doses as needed for chest pain.   OMEPRAZOLE (PRILOSEC) 20 MG CAPSULE    Take 20 mg by mouth daily.   POTASSIUM CHLORIDE (K-DUR) 10 MEQ TABLET    Take 10 mEq by mouth daily.   VITAMIN D, ERGOCALCIFEROL, (DRISDOL) 50000 UNITS CAPS CAPSULE    Take 50,000 Units by mouth every 7 (seven) days.  Modified Medications   No medications on file  Discontinued Medications   POTASSIUM CHLORIDE SA (K-DUR,KLOR-CON) 20 MEQ TABLET    Take 20 mEq by mouth once.     Physical Exam:  Filed Vitals:   08/30/14 1102  BP: 160/65  Pulse: 56  Temp: 97.6 F (36.4 C)  Resp: 18  Weight: 105 lb (47.628 kg)  SpO2: 96%    Physical Exam  Constitutional: She is oriented to person, place, and time. No distress.  Frail, elderly  HENT:  Head: Normocephalic and atraumatic.  Eyes: Pupils are equal, round, and reactive to light.  Neck: Normal range of motion. Neck supple. No JVD present. No tracheal deviation present.  Cardiovascular: Normal rate and regular rhythm.   Pulmonary/Chest: Effort normal and breath sounds normal. No respiratory distress. She has no wheezes.  Abdominal: Soft. Bowel sounds are normal. She exhibits no distension. There is no tenderness.  Musculoskeletal:  Right foot drop. Atrophy noted to right calf muscle.  Lymphadenopathy:    She has no cervical adenopathy.  Neurological: She is alert and oriented to person, place, and time. No cranial nerve deficit.  Skin: Skin is warm and dry. She is not diaphoretic.    Labs reviewed/Significant Diagnostic  Results:  Basic Metabolic Panel:  Recent Labs  11/21/13 0443 11/22/13 0500 11/23/13 0723  06/16/14 07/05/14 08/02/14  NA 138 138 139  < > 139 140 140  K 3.6* 3.6* 3.8  < > 4.4 4.3 3.8  CL 105 100 102  --   --   --   --   CO2 22 22 23   --   --   --   --   GLUCOSE 101* 104* 103*  --   --   --   --   BUN 14 15 14   < > 22* 23* 17  CREATININE 0.86 0.74 0.68  < > 0.9 0.9 0.9  CALCIUM 8.6 8.6 8.8  --   --   --   --   < > = values in this interval not  displayed. Liver Function Tests:  Recent Labs  11/18/13 0427 11/30/13  AST 34 20  ALT 15 14  ALKPHOS 72 123  BILITOT 0.4  --   PROT 5.2*  --   ALBUMIN 2.7*  --    No results for input(s): LIPASE, AMYLASE in the last 8760 hours. No results for input(s): AMMONIA in the last 8760 hours. CBC:  Recent Labs  11/16/13 1849  11/21/13 0443 11/22/13 0500 11/23/13 0723 11/30/13 03/22/14 06/16/14  WBC 12.5*  < > 6.4 9.3 9.2 7.7 6.4 6.0  NEUTROABS 10.3*  --   --   --   --   --   --   --   HGB 10.9*  < > 7.9* 9.7* 9.5* 9.8* 10.5* 12.9  HCT 33.2*  < > 23.9* 28.2* 28.2* 30* 32* 38  MCV 90.0  < > 89.5 88.7 89.0  --   --   --   PLT 241  < > 204 314 314 526* 278 235  < > = values in this interval not displayed. CBG: No results for input(s): GLUCAP in the last 8760 hours. TSH: No results for input(s): TSH in the last 8760 hours. A1C: Lab Results  Component Value Date   HGBA1C 6.1* 08/10/2012   06/16/14: Iron 69, TIBC 299, %sat 23, VIt D 20  2D echo 2014 Study Conclusions  - Left ventricle: Systolic function was normal. The estimated ejection fraction was in the range of 55% to 60%. Although no diagnostic regional wall motion abnormality was identified, this possibility cannot be completely excluded on the basis of this study. Doppler parameters are consistent with abnormal left ventricular relaxation (grade 1 diastolic dysfunction). - Ventricular septum: The contour showed diastolic flattening. These changes are  consistent with RV volume overload. - Aortic valve: Trivial regurgitation. - Mitral valve: Calcified annulus. - Right ventricle: The cavity size was moderately dilated. Systolic function was mildly reduced. - Right atrium: The atrium was mildly dilated. - Atrial septum: A septal defect cannot be excluded. - Tricuspid valve: Moderate-severe regurgitation directed centrally. - Pulmonary arteries: PA peak pressure: 76mm Hg (S).  Assessment/Plan  1. Overactive bladder Improved with removing carbonated beverages from diet. Continue with bladder training.  2. Essential hypertension Systolic BP above goal at times, but diastolic rather low in the 40's. Would continue current meds and monitor BP. At risk for falls.   3. Pulmonary embolism, Jan 2012 No new events, no signs of bleeding. Continue Eliquis  4. Vitamin D deficiency Replenished, continue Vit D  5. Hypokalemia Replenished, continue K dur  6. Right foot drop Continue brace and supportive care. Needs help with ambulation and and spends most the day in the chair. Denies pain  7. Anxiety Slightly worse due to situational issues. Continue Buspar and Ativan. If no improvement will reconsider pharmacologic intervention  Cindi Carbon, [place] [phone]

## 2014-09-15 ENCOUNTER — Encounter: Payer: Self-pay | Admitting: Adult Health

## 2014-09-15 ENCOUNTER — Non-Acute Institutional Stay (SKILLED_NURSING_FACILITY): Payer: Medicare Other | Admitting: Adult Health

## 2014-09-15 DIAGNOSIS — N3000 Acute cystitis without hematuria: Secondary | ICD-10-CM

## 2014-09-15 DIAGNOSIS — N3281 Overactive bladder: Secondary | ICD-10-CM

## 2014-09-15 NOTE — Progress Notes (Signed)
Patient ID: Cathy Wallace, female   DOB: 1916-11-17, 81 y.o.   MRN: [id number]  Nursing Home Location:  [name]   Code Status: DNR   Place of Service: SNF (31)  Chief Complaint  Patient presents with  . Acute Visit    urinary frequency    HPI:  81 y.o.  Female residing at [facility], skilled care section. I was asked to see her today for urinary frequency and a positive urine. She has had frequency for several weeks and has tried bladder training and dietary modification with no improvement. She denies dysuria or fever but was stating the frequency has gotten worse and that she is waking up every 1-2 hrs. The staff collected a urine showing WBCs, RBCs, and positive for many bacteria.     Review of Systems:  Review of Systems  Constitutional: Negative for fever, chills, appetite change and fatigue.  Gastrointestinal: Negative for abdominal pain, constipation and abdominal distention.  Genitourinary: Positive for frequency. Negative for dysuria, flank pain and difficulty urinating.       Incontinence  Psychiatric/Behavioral: The patient is nervous/anxious.     Medications: Patient's Medications  New Prescriptions   No medications on file  Previous Medications   ACETAMINOPHEN (TYLENOL) 325 MG TABLET    Take 2 tablets (650 mg total) by mouth 3 (three) times daily.   APIXABAN (ELIQUIS) 2.5 MG TABS TABLET    Take 1 tablet (2.5 mg total) by mouth 2 (two) times daily.   BUSPIRONE (BUSPAR) 15 MG TABLET    Take 15 mg by mouth 3 (three) times daily.   CALCIUM CARBONATE (TUMS - DOSED IN MG ELEMENTAL CALCIUM) 500 MG CHEWABLE TABLET    Chew 1 tablet by mouth 2 (two) times daily.   CITALOPRAM (CELEXA) 40 MG TABLET    Take 40 mg by mouth daily.   DILTIAZEM (CARDIZEM CD) 180 MG 24 HR CAPSULE    Take 1 capsule (180 mg total) by mouth daily after lunch.   DOXAZOSIN (CARDURA) 8 MG TABLET    Take 8 mg by mouth daily.   FERROUS SULFATE 325 (65  FE) MG TABLET    Take 1 tablet (325 mg total) by mouth daily with breakfast.   FLUTICASONE (VERAMYST) 27.5 MCG/SPRAY NASAL SPRAY    Place 2 sprays into the nose daily.   HYDROCODONE-ACETAMINOPHEN (NORCO/VICODIN) 5-325 MG PER TABLET    Take 1 tablet by mouth every 6 (six) hours as needed for severe pain.   LISINOPRIL (PRINIVIL,ZESTRIL) 40 MG TABLET    Take 40 mg by mouth daily.   LOPERAMIDE (IMODIUM A-D) 2 MG TABLET    Take 2 mg by mouth 4 (four) times daily as needed for diarrhea or loose stools.   LORATADINE (CLARITIN) 10 MG TABLET    Take 10 mg by mouth daily as needed for allergies.    LORAZEPAM (ATIVAN) 1 MG TABLET    Take 0.5-2 mg by mouth 2 (two) times daily. Takes 0.5mg  as needed, 1mg  in am, 2mg  at bedtime   MELATONIN 3 MG TABS    Take 1 tablet by mouth at bedtime.   NITROGLYCERIN (NITROSTAT) 0.4 MG SL TABLET    Place 1 tablet (0.4 mg total) under the tongue every 5 (five) minutes x 3 doses as needed for chest pain.   OMEPRAZOLE (PRILOSEC) 20 MG CAPSULE    Take 20 mg by mouth daily.   POTASSIUM CHLORIDE (K-DUR) 10 MEQ TABLET    Take 10 mEq by  mouth daily.   VITAMIN D, ERGOCALCIFEROL, (DRISDOL) 50000 UNITS CAPS CAPSULE    Take 50,000 Units by mouth every 7 (seven) days.  Modified Medications   No medications on file  Discontinued Medications   No medications on file     Physical Exam:  Filed Vitals:   09/15/14 1308  BP: 154/70  Pulse: 55  Temp: 98.2 F (36.8 C)  Resp: 16    Physical Exam  Constitutional: She is oriented to person, place, and time. No distress.  Abdominal: Soft. Bowel sounds are normal. She exhibits no distension. There is no tenderness.  No s/p or cva tenderness  Neurological: She is alert and oriented to person, place, and time.  Skin: Skin is warm and dry. She is not diaphoretic.  Psychiatric: Affect normal.    Labs reviewed/Significant Diagnostic Results:  Basic Metabolic Panel:  Recent Labs  11/21/13 0443 11/22/13 0500 11/23/13 0723  06/16/14  07/05/14 08/02/14  NA 138 138 139  < > 139 140 140  K 3.6* 3.6* 3.8  < > 4.4 4.3 3.8  CL 105 100 102  --   --   --   --   CO2 22 22 23   --   --   --   --   GLUCOSE 101* 104* 103*  --   --   --   --   BUN 14 15 14   < > 22* 23* 17  CREATININE 0.86 0.74 0.68  < > 0.9 0.9 0.9  CALCIUM 8.6 8.6 8.8  --   --   --   --   < > = values in this interval not displayed. Liver Function Tests:  Recent Labs  11/18/13 0427 11/30/13  AST 34 20  ALT 15 14  ALKPHOS 72 123  BILITOT 0.4  --   PROT 5.2*  --   ALBUMIN 2.7*  --    No results for input(s): LIPASE, AMYLASE in the last 8760 hours. No results for input(s): AMMONIA in the last 8760 hours. CBC:  Recent Labs  11/16/13 1849  11/21/13 0443 11/22/13 0500 11/23/13 0723 11/30/13 03/22/14 06/16/14  WBC 12.5*  < > 6.4 9.3 9.2 7.7 6.4 6.0  NEUTROABS 10.3*  --   --   --   --   --   --   --   HGB 10.9*  < > 7.9* 9.7* 9.5* 9.8* 10.5* 12.9  HCT 33.2*  < > 23.9* 28.2* 28.2* 30* 32* 38  MCV 90.0  < > 89.5 88.7 89.0  --   --   --   PLT 241  < > 204 314 314 526* 278 235  < > = values in this interval not displayed. CBG: No results for input(s): GLUCAP in the last 8760 hours. TSH: No results for input(s): TSH in the last 8760 hours. A1C: Lab Results  Component Value Date   HGBA1C 6.1* 08/10/2012      Assessment/Plan  1) UTI Bactrim DS 1 tab BID for 3 days  2) OAB Continue bladder training, if no improvement after bactrim, will consider Mybertriq.  Cindi Carbon, ANP [facility] [phone]

## 2014-10-05 ENCOUNTER — Non-Acute Institutional Stay (SKILLED_NURSING_FACILITY): Payer: Medicare Other | Admitting: Internal Medicine

## 2014-10-05 ENCOUNTER — Encounter: Payer: Self-pay | Admitting: Internal Medicine

## 2014-10-05 DIAGNOSIS — I10 Essential (primary) hypertension: Secondary | ICD-10-CM | POA: Diagnosis not present

## 2014-10-05 DIAGNOSIS — F411 Generalized anxiety disorder: Secondary | ICD-10-CM | POA: Diagnosis not present

## 2014-10-05 DIAGNOSIS — Z91048 Other nonmedicinal substance allergy status: Secondary | ICD-10-CM | POA: Diagnosis not present

## 2014-10-05 DIAGNOSIS — I6930 Unspecified sequelae of cerebral infarction: Secondary | ICD-10-CM | POA: Diagnosis not present

## 2014-10-05 DIAGNOSIS — M21371 Foot drop, right foot: Secondary | ICD-10-CM | POA: Diagnosis not present

## 2014-10-05 DIAGNOSIS — K219 Gastro-esophageal reflux disease without esophagitis: Secondary | ICD-10-CM | POA: Diagnosis not present

## 2014-10-05 DIAGNOSIS — Z9109 Other allergy status, other than to drugs and biological substances: Secondary | ICD-10-CM

## 2014-10-05 NOTE — Progress Notes (Signed)
Patient ID: Cathy Wallace, female   DOB: August 02, 1916, 81 y.o.   MRN: [id number]  Location:  [facility] Provider:  Rexene Edison. Mariea Clonts, D.O., C.M.D.  Code Status:  DNR Advanced Directive information Does patient have an advance directive?: Yes, Type of Advance Directive: Living will;Healthcare Power of [place];Out of facility DNR (pink MOST or yellow form), Pre-existing out of facility DNR order (yellow form or pink MOST form): Pink MOST form placed in chart (order not valid for inpatient use);Yellow form placed in chart (order not valid for inpatient use), Does patient want to make changes to advanced directive?: No - Patient declined  Chief Complaint  Patient presents with  . Medical Management of Chronic Issues    HPI:  81 yo female long term snf resident was seen for med mgt of her chronic diseases.  She is doing well overall.  She was seen in the early morning and was about to apply her makeup. She has a special shoe for her right foot due to foot drop.  She has cerebrovascular disease with prior stroke and right hemiplegia and lumbar spinal stenosis.   She c/o not being able to keep her right heel down when she attempts to stand up to use her walker or transfer.  She says if she tightens the strap at the ankle, it is too painful to wear due to pressure on the dorsum of her foot.     Her dysphagia is stable.  She admits to occasional arthritis pains but feels they are controlled.  Her mood is good.  She has a caregiver who spends part of the day with her in her skilled room and helps with ADLs.  Review of Systems:  Review of Systems  Constitutional: Negative for fever, chills and malaise/fatigue.  HENT: Negative for congestion.   Respiratory: Negative for shortness of breath.   Cardiovascular: Negative for chest pain and leg swelling.  Gastrointestinal: Negative for abdominal pain and constipation.  Genitourinary: Negative for dysuria.  Musculoskeletal: Positive for back pain and  joint pain. Negative for falls.       Right foot drop  Skin: Negative for rash.  Neurological: Positive for sensory change and focal weakness. Negative for dizziness.  Psychiatric/Behavioral: Negative for depression. The patient does not have insomnia.     Medications: Patient's Medications  New Prescriptions   No medications on file  Previous Medications   ACETAMINOPHEN (TYLENOL) 325 MG TABLET    Take 2 tablets (650 mg total) by mouth 3 (three) times daily.   APIXABAN (ELIQUIS) 2.5 MG TABS TABLET    Take 1 tablet (2.5 mg total) by mouth 2 (two) times daily.   BUSPIRONE (BUSPAR) 15 MG TABLET    Take 15 mg by mouth 3 (three) times daily.   CALCIUM CARBONATE (TUMS - DOSED IN MG ELEMENTAL CALCIUM) 500 MG CHEWABLE TABLET    Chew 1 tablet by mouth 2 (two) times daily.   CITALOPRAM (CELEXA) 40 MG TABLET    Take 40 mg by mouth daily.   DILTIAZEM (CARDIZEM CD) 180 MG 24 HR CAPSULE    Take 1 capsule (180 mg total) by mouth daily after lunch.   DOXAZOSIN (CARDURA) 8 MG TABLET    Take 8 mg by mouth daily.   FERROUS SULFATE 325 (65 FE) MG TABLET    Take 1 tablet (325 mg total) by mouth daily with breakfast.   FLUTICASONE (VERAMYST) 27.5 MCG/SPRAY NASAL SPRAY    Place 2 sprays into the nose daily.  HYDROCODONE-ACETAMINOPHEN (NORCO/VICODIN) 5-325 MG PER TABLET    Take 1 tablet by mouth every 6 (six) hours as needed for severe pain.   LISINOPRIL (PRINIVIL,ZESTRIL) 40 MG TABLET    Take 40 mg by mouth daily.   LOPERAMIDE (IMODIUM A-D) 2 MG TABLET    Take 2 mg by mouth 4 (four) times daily as needed for diarrhea or loose stools.   LORATADINE (CLARITIN) 10 MG TABLET    Take 10 mg by mouth daily as needed for allergies.    LORAZEPAM (ATIVAN) 1 MG TABLET    Take 0.5-2 mg by mouth 2 (two) times daily. Takes 0.5mg  as needed, 1mg  in am, 2mg  at bedtime   MELATONIN 3 MG TABS    Take 1 tablet by mouth at bedtime.   NITROGLYCERIN (NITROSTAT) 0.4 MG SL TABLET    Place 1 tablet (0.4 mg total) under the tongue every 5  (five) minutes x 3 doses as needed for chest pain.   OMEPRAZOLE (PRILOSEC) 20 MG CAPSULE    Take 20 mg by mouth daily.   POTASSIUM CHLORIDE (K-DUR) 10 MEQ TABLET    Take 10 mEq by mouth daily.   VITAMIN D, ERGOCALCIFEROL, (DRISDOL) 50000 UNITS CAPS CAPSULE    Take 50,000 Units by mouth every 7 (seven) days.  Modified Medications   No medications on file  Discontinued Medications   No medications on file    Physical Exam: Filed Vitals:   10/05/14 1820  BP: 155/61  Pulse: 59  Temp: 97.7 F (36.5 C)  Resp: 16  Height: 5\' 3"  (1.6 m)  Weight: 103 lb (46.72 kg)  SpO2: 94%  Physical Exam  Constitutional: She is oriented to person, place, and time. She appears well-nourished. No distress.  Cardiovascular: Normal rate, regular rhythm, normal heart sounds and intact distal pulses.   Pulmonary/Chest: Breath sounds normal.  Abdominal: Soft. Bowel sounds are normal. She exhibits no distension. There is no tenderness.  Musculoskeletal:  Right hemiplegia, leans to right; right foot drop and unable to keep heel planted on right despite brace  Neurological: She is alert and oriented to person, place, and time.  Skin: Skin is warm and dry.    Labs reviewed: Basic Metabolic Panel:  Recent Labs  11/21/13 0443 11/22/13 0500 11/23/13 0723  06/16/14 07/05/14 08/02/14  NA 138 138 139  < > 139 140 140  K 3.6* 3.6* 3.8  < > 4.4 4.3 3.8  CL 105 100 102  --   --   --   --   CO2 22 22 23   --   --   --   --   GLUCOSE 101* 104* 103*  --   --   --   --   BUN 14 15 14   < > 22* 23* 17  CREATININE 0.86 0.74 0.68  < > 0.9 0.9 0.9  CALCIUM 8.6 8.6 8.8  --   --   --   --   < > = values in this interval not displayed.  Liver Function Tests:  Recent Labs  11/18/13 0427 11/30/13  AST 34 20  ALT 15 14  ALKPHOS 72 123  BILITOT 0.4  --   PROT 5.2*  --   ALBUMIN 2.7*  --     CBC:  Recent Labs  11/16/13 1849  11/21/13 0443 11/22/13 0500 11/23/13 0723 11/30/13 03/22/14 06/16/14  WBC 12.5*  <  > 6.4 9.3 9.2 7.7 6.4 6.0  NEUTROABS 10.3*  --   --   --   --   --   --   --  HGB 10.9*  < > 7.9* 9.7* 9.5* 9.8* 10.5* 12.9  HCT 33.2*  < > 23.9* 28.2* 28.2* 30* 32* 38  MCV 90.0  < > 89.5 88.7 89.0  --   --   --   PLT 241  < > 204 314 314 526* 278 235  < > = values in this interval not displayed.   Assessment/Plan 1. Right foot drop -advised to f/u with bioprosthetics who was working on getting brace fitted properly--nursing manager to arrange appt  2. Essential hypertension -bp at goal with current regimen  3. History of stroke with residual deficit -with right hemiplegia, dysphagia -has caregiver to assist her for several hours of the day and lives in SNF -cont secondary prevention--on eliquis (also had PE 2012), bp regimen, not on statin therapy at 81yo  4. Generalized anxiety disorder -continues on celexa, lorazepam for nerves, melatonin for sleep -? If lorazepam could be reduced at all  5. Gastroesophageal reflux disease without esophagitis -continues on prilosec and tums -has esophageal dyskinesia and dysphagia and still has symptoms of acid reflux at times so will not change regimen  6. Environmental allergies -cont loratadine as needed and fluticasone nasal spray for this which tends to be worse this time of yea  Family/ staff Communication: discussed with nursing staff Goals of care: long term care, has MOST and living will, hcpoa completed and scanned in media  Labs/tests ordered:  No new, pt stable

## 2014-11-03 ENCOUNTER — Encounter: Payer: Self-pay | Admitting: Adult Health

## 2014-11-03 ENCOUNTER — Non-Acute Institutional Stay (SKILLED_NURSING_FACILITY): Payer: Medicare Other | Admitting: Adult Health

## 2014-11-03 DIAGNOSIS — F411 Generalized anxiety disorder: Secondary | ICD-10-CM | POA: Diagnosis not present

## 2014-11-03 DIAGNOSIS — Z7901 Long term (current) use of anticoagulants: Secondary | ICD-10-CM

## 2014-11-03 DIAGNOSIS — F329 Major depressive disorder, single episode, unspecified: Secondary | ICD-10-CM | POA: Diagnosis not present

## 2014-11-03 DIAGNOSIS — N3281 Overactive bladder: Secondary | ICD-10-CM | POA: Diagnosis not present

## 2014-11-03 DIAGNOSIS — I10 Essential (primary) hypertension: Secondary | ICD-10-CM

## 2014-11-03 DIAGNOSIS — F32A Depression, unspecified: Secondary | ICD-10-CM

## 2014-11-03 DIAGNOSIS — I2699 Other pulmonary embolism without acute cor pulmonale: Secondary | ICD-10-CM | POA: Diagnosis not present

## 2014-11-03 NOTE — Progress Notes (Signed)
Patient ID: Cathy Wallace, female   DOB: 05/09/1917, 81 y.o.   MRN: [id number]     Nursing Home Location:  [name]   Code Status: DNR   Place of Service: SNF (31)  Chief Complaint  Patient presents with  . Medical Management of Chronic Issues    HPI:  81 y.o.  Female residing at [facility], skilled care section. I am here to review her chronic medical issues. 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 She reports that she continues to have anxiety throughout the day about her schedule and other small issues. She reports this has been present most of her life. Her BP has been consistently elevated for a few months.  She denies SOB, CP, DOE, or edema.  She is on myrbetriq for OAB and reports that she only wakes up to urinate 1-2 times per night, which is an improvement.  She has right foot drop and continues with a brace. She is mostly sedentary and walks once a day with a walker and 2-3 person assist.    Review of Systems:  Review of Systems  Constitutional: Negative for fever, chills, appetite change and fatigue.  HENT: Negative for congestion and trouble swallowing.   Respiratory: Negative for cough and shortness of breath.   Cardiovascular: Negative for chest pain, palpitations and leg swelling.  Gastrointestinal: Negative for abdominal pain, constipation and abdominal distention.  Genitourinary: Positive for frequency. Negative for dysuria, flank pain and difficulty urinating.       Incontinence  Skin: Negative for rash and wound.  Neurological: Positive for numbness. Negative for dizziness, tremors, seizures, syncope and speech difficulty.  Psychiatric/Behavioral: Negative for agitation. The patient is nervous/anxious.     Medications: Patient's Medications  New Prescriptions   No medications on file  Previous Medications   ACETAMINOPHEN (TYLENOL) 325 MG TABLET    Take  2 tablets (650 mg total) by mouth 3 (three) times daily.   APIXABAN (ELIQUIS) 2.5 MG TABS TABLET    Take 1 tablet (2.5 mg total) by mouth 2 (two) times daily.   BUSPIRONE (BUSPAR) 15 MG TABLET    Take 15 mg by mouth 3 (three) times daily.   CALCIUM CARBONATE (TUMS - DOSED IN MG ELEMENTAL CALCIUM) 500 MG CHEWABLE TABLET    Chew 1 tablet by mouth 2 (two) times daily.   CITALOPRAM (CELEXA) 40 MG TABLET    Take 40 mg by mouth daily.   DILTIAZEM (CARDIZEM CD) 180 MG 24 HR CAPSULE    Take 1 capsule (180 mg total) by mouth daily after lunch.   DOXAZOSIN (CARDURA) 8 MG TABLET    Take 8 mg by mouth daily.   FERROUS SULFATE 325 (65 FE) MG TABLET    Take 1 tablet (325 mg total) by mouth daily with breakfast.   FLUTICASONE (VERAMYST) 27.5 MCG/SPRAY NASAL SPRAY    Place 2 sprays into the nose daily.   HYDROCODONE-ACETAMINOPHEN (NORCO/VICODIN) 5-325 MG PER TABLET    Take 1 tablet by mouth every 6 (six) hours as needed for severe pain.   LISINOPRIL (PRINIVIL,ZESTRIL) 40 MG TABLET    Take 40 mg by mouth daily.   LOPERAMIDE (IMODIUM A-D) 2 MG TABLET    Take 2 mg by mouth 4 (four) times daily as needed for diarrhea or loose stools.   LORATADINE (CLARITIN) 10 MG TABLET    Take 10 mg by mouth daily as needed for allergies.  LORAZEPAM (ATIVAN) 1 MG TABLET    Take 0.5-2 mg by mouth 2 (two) times daily. Takes 0.5mg  as needed, 1mg  in am, 2mg  at bedtime   MELATONIN 3 MG TABS    Take 1 tablet by mouth at bedtime.   MIRABEGRON ER (MYRBETRIQ) 25 MG TB24 TABLET    Take 25 mg by mouth daily.   NITROGLYCERIN (NITROSTAT) 0.4 MG SL TABLET    Place 1 tablet (0.4 mg total) under the tongue every 5 (five) minutes x 3 doses as needed for chest pain.   OMEPRAZOLE (PRILOSEC) 20 MG CAPSULE    Take 20 mg by mouth daily.   POTASSIUM CHLORIDE (K-DUR) 10 MEQ TABLET    Take 10 mEq by mouth daily.   VITAMIN D, ERGOCALCIFEROL, (DRISDOL) 50000 UNITS CAPS CAPSULE    Take 50,000 Units by mouth every 7 (seven) days.  Modified Medications   No  medications on file  Discontinued Medications   No medications on file     Physical Exam:  Filed Vitals:   11/03/14 1212  BP: 159/67  Pulse: 58  Temp: 97.7 F (36.5 C)  Resp: 18  Weight: 104 lb 12.8 oz (47.537 kg)  SpO2: 95%    Physical Exam  Constitutional: She is oriented to person, place, and time. No distress.  Cardiovascular: Normal rate and regular rhythm.   No murmur heard. No edema  Pulmonary/Chest: Effort normal and breath sounds normal. No respiratory distress.  Abdominal: Soft. Bowel sounds are normal. She exhibits no distension. There is no tenderness.  Musculoskeletal: She exhibits no edema or tenderness.  Neurological: She is alert and oriented to person, place, and time.  Skin: Skin is warm and dry. She is not diaphoretic.  Psychiatric: Affect normal.    Labs reviewed/Significant Diagnostic Results: 2D echo 2014  ------------------------------------------------------------ Study Conclusions  - Left ventricle: Systolic function was normal. The estimated ejection fraction was in the range of 55% to 60%. Although no diagnostic regional wall motion abnormality was identified, this possibility cannot be completely excluded on the basis of this study. Doppler parameters are consistent with abnormal left ventricular relaxation (grade 1 diastolic dysfunction). - Ventricular septum: The contour showed diastolic flattening. These changes are consistent with RV volume overload. - Aortic valve: Trivial regurgitation. - Mitral valve: Calcified annulus. - Right ventricle: The cavity size was moderately dilated. Systolic function was mildly reduced. - Right atrium: The atrium was mildly dilated. - Atrial septum: A septal defect cannot be excluded. - Tricuspid valve: Moderate-severe regurgitation directed centrally. - Pulmonary arteries: PA peak pressure: 55mm Hg (S). Transthoracic echocardiography. M-mode, complete 2D, spectral Doppler,  and color Doppler. Height: Height: 160cm. Height: 63in. Weight: Weight: 56.7kg. Weight: 124.7lb. Body mass index: BMI: 22.1kg/m^2. Body surface area:  BSA: 1.87m^2. Patient status: Inpatient. Location: ICU/CCU Basic Metabolic Panel:  Recent Labs  11/21/13 0443 11/22/13 0500 11/23/13 0723  06/16/14 07/05/14 08/02/14  NA 138 138 139  < > 139 140 140  K 3.6* 3.6* 3.8  < > 4.4 4.3 3.8  CL 105 100 102  --   --   --   --   CO2 22 22 23   --   --   --   --   GLUCOSE 101* 104* 103*  --   --   --   --   BUN 14 15 14   < > 22* 23* 17  CREATININE 0.86 0.74 0.68  < > 0.9 0.9 0.9  CALCIUM 8.6 8.6 8.8  --   --   --   --   < > =  values in this interval not displayed. Liver Function Tests:  Recent Labs  11/18/13 0427 11/30/13  AST 34 20  ALT 15 14  ALKPHOS 72 123  BILITOT 0.4  --   PROT 5.2*  --   ALBUMIN 2.7*  --    No results for input(s): LIPASE, AMYLASE in the last 8760 hours. No results for input(s): AMMONIA in the last 8760 hours. CBC:  Recent Labs  11/16/13 1849  11/21/13 0443 11/22/13 0500 11/23/13 0723 11/30/13 03/22/14 06/16/14  WBC 12.5*  < > 6.4 9.3 9.2 7.7 6.4 6.0  NEUTROABS 10.3*  --   --   --   --   --   --   --   HGB 10.9*  < > 7.9* 9.7* 9.5* 9.8* 10.5* 12.9  HCT 33.2*  < > 23.9* 28.2* 28.2* 30* 32* 38  MCV 90.0  < > 89.5 88.7 89.0  --   --   --   PLT 241  < > 204 314 314 526* 278 235  < > = values in this interval not displayed. CBG: No results for input(s): GLUCAP in the last 8760 hours. TSH: No results for input(s): TSH in the last 8760 hours. A1C: Lab Results  Component Value Date   HGBA1C 6.1* 08/10/2012      Assessment/Plan  1. Generalized anxiety disorder Continued periods of anxiety that may be contributing to her elevated BP. Increase Buspar to 20 mg BID  2. Essential hypertension Elevated, asymptomatic, continue current meds, see above plan.    3. Pulmonary embolism, Jan 2012 Recurrent, with DVT in 2011 as well. Continue  Eliquis, tolerating well without complication  4. Long term current use of anticoagulant therapy See above  5. Depression Stable, continue Celexa  6. OAB (overactive bladder) Improved, continued Myrbetriq    Cindi Carbon, [place] [phone]

## 2014-12-15 ENCOUNTER — Non-Acute Institutional Stay (SKILLED_NURSING_FACILITY): Payer: Medicare Other | Admitting: Adult Health

## 2014-12-15 ENCOUNTER — Encounter: Payer: Self-pay | Admitting: Adult Health

## 2014-12-15 DIAGNOSIS — F329 Major depressive disorder, single episode, unspecified: Secondary | ICD-10-CM

## 2014-12-15 DIAGNOSIS — K219 Gastro-esophageal reflux disease without esophagitis: Secondary | ICD-10-CM

## 2014-12-15 DIAGNOSIS — N3281 Overactive bladder: Secondary | ICD-10-CM | POA: Diagnosis not present

## 2014-12-15 DIAGNOSIS — F411 Generalized anxiety disorder: Secondary | ICD-10-CM | POA: Diagnosis not present

## 2014-12-15 DIAGNOSIS — I482 Chronic atrial fibrillation, unspecified: Secondary | ICD-10-CM

## 2014-12-15 DIAGNOSIS — I10 Essential (primary) hypertension: Secondary | ICD-10-CM

## 2014-12-15 DIAGNOSIS — F32A Depression, unspecified: Secondary | ICD-10-CM

## 2014-12-15 NOTE — Progress Notes (Signed)
Patient ID: Cathy Wallace, female   DOB: [birth year], 81 y.o.   MRN: [id number]     Nursing Home Location:  [name]   Code Status: DNR   Place of Service: SNF (31)  Chief Complaint  Patient presents with  . Medical Management of Chronic Issues    HPI:  81 y.o.  Female residing at [facility], skilled care section. I am here to review her chronic medical issues. 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  Essential hypertension BP range 145-157/62-74. Currently on cardura, cardizem, and lisinopril  Gastroesophageal reflux disease without esophagitis Prilosec was discontinued last month. She denies symptoms of indigestion.   OAB (overactive bladder) Reports urinating 1-3 times per night. Currently on Myrbetriq   Depression Reports feelings of worthlessness and feeling that there is no point to life. She is currently on celexa. She feels that her life has no meaning any more, that she is just existing, not really living.    Generalized anxiety disorder Has feelings of anxiety about her caregiver schedule and general loss of control. She is on Buspar and it was increased last month. She reports mild improvement in symptoms.  Afib HR 57. No reports of palpitations, CP or SOB. ON eliquis with no signs of bleeding.   Review of Systems:  Review of Systems  Constitutional: Negative for fever, chills, appetite change and fatigue.  HENT: Negative for congestion and trouble swallowing.   Respiratory: Negative for cough and shortness of breath.   Cardiovascular: Negative for chest pain, palpitations and leg swelling.  Gastrointestinal: Negative for abdominal pain, constipation and abdominal distention.  Genitourinary: Positive for frequency. Negative for dysuria, flank pain and difficulty urinating.       Incontinence  Skin: Negative for rash and wound.  Neurological: Positive for  numbness. Negative for dizziness, tremors, seizures, syncope and speech difficulty.  Psychiatric/Behavioral: Positive for dysphoric mood. Negative for suicidal ideas, sleep disturbance and agitation. The patient is nervous/anxious.     Medications: Patient's Medications  New Prescriptions   No medications on file  Previous Medications   ACETAMINOPHEN (TYLENOL) 325 MG TABLET    Take 2 tablets (650 mg total) by mouth 3 (three) times daily.   APIXABAN (ELIQUIS) 2.5 MG TABS TABLET    Take 1 tablet (2.5 mg total) by mouth 2 (two) times daily.   BUSPIRONE (BUSPAR) 30 MG TABLET    Take 30 mg by mouth 2 (two) times daily.   CALCIUM CARBONATE (TUMS - DOSED IN MG ELEMENTAL CALCIUM) 500 MG CHEWABLE TABLET    Chew 1 tablet by mouth 2 (two) times daily.   CHOLECALCIFEROL (VITAMIN D) 1000 UNITS TABLET    Take 2,000 Units by mouth daily.   CITALOPRAM (CELEXA) 40 MG TABLET    Take 40 mg by mouth daily.   DILTIAZEM (CARDIZEM CD) 180 MG 24 HR CAPSULE    Take 1 capsule (180 mg total) by mouth daily after lunch.   DOXAZOSIN (CARDURA) 8 MG TABLET    Take 8 mg by mouth daily.   FERROUS SULFATE 325 (65 FE) MG TABLET    Take 1 tablet (325 mg total) by mouth daily with breakfast.   FLUTICASONE (VERAMYST) 27.5 MCG/SPRAY NASAL SPRAY    Place 2 sprays into the nose daily.   HYDROCODONE-ACETAMINOPHEN (NORCO/VICODIN) 5-325 MG PER TABLET    Take 1 tablet by mouth every 6 (six) hours as needed for severe pain.  LISINOPRIL (PRINIVIL,ZESTRIL) 40 MG TABLET    Take 40 mg by mouth daily.   LOPERAMIDE (IMODIUM A-D) 2 MG TABLET    Take 2 mg by mouth 4 (four) times daily as needed for diarrhea or loose stools.   LORATADINE (CLARITIN) 10 MG TABLET    Take 10 mg by mouth daily as needed for allergies.    LORAZEPAM (ATIVAN) 1 MG TABLET    Take 0.5-2 mg by mouth 2 (two) times daily. Takes 0.5mg  as needed, 1mg  in am, 2mg  at bedtime   MELATONIN 3 MG TABS    Take 1 tablet by mouth at bedtime.   MIRABEGRON ER (MYRBETRIQ) 25 MG TB24  TABLET    Take 25 mg by mouth daily.   NITROGLYCERIN (NITROSTAT) 0.4 MG SL TABLET    Place 1 tablet (0.4 mg total) under the tongue every 5 (five) minutes x 3 doses as needed for chest pain.   POTASSIUM CHLORIDE (K-DUR) 10 MEQ TABLET    Take 10 mEq by mouth daily.  Modified Medications   No medications on file  Discontinued Medications   BUSPIRONE (BUSPAR) 15 MG TABLET    Take 15 mg by mouth 3 (three) times daily.   OMEPRAZOLE (PRILOSEC) 20 MG CAPSULE    Take 20 mg by mouth daily.   VITAMIN D, ERGOCALCIFEROL, (DRISDOL) 50000 UNITS CAPS CAPSULE    Take 50,000 Units by mouth every 7 (seven) days.     Physical Exam:  Filed Vitals:   12/15/14 1633  BP: 157/71  Pulse: 74  Temp: 98.1 F (36.7 C)  Resp: 20  Weight: 106 lb 3.2 oz (48.172 kg)  SpO2: 96%    Physical Exam  Constitutional: She is oriented to person, place, and time. No distress.  Cardiovascular: Normal rate and regular rhythm.   No murmur heard. No edema  Pulmonary/Chest: Effort normal and breath sounds normal. No respiratory distress.  Abdominal: Soft. Bowel sounds are normal. She exhibits no distension. There is no tenderness.  Musculoskeletal: She exhibits no edema or tenderness.  Right foot drop  Neurological: She is alert and oriented to person, place, and time.  Skin: Skin is warm and dry. She is not diaphoretic.  Psychiatric: Affect normal.    Labs reviewed/Significant Diagnostic Results: 2D echo 2014  ------------------------------------------------------------ Study Conclusions  - Left ventricle: Systolic function was normal. The estimated ejection fraction was in the range of 55% to 60%. Although no diagnostic regional wall motion abnormality was identified, this possibility cannot be completely excluded on the basis of this study. Doppler parameters are consistent with abnormal left ventricular relaxation (grade 1 diastolic dysfunction). - Ventricular septum: The contour showed  diastolic flattening. These changes are consistent with RV volume overload. - Aortic valve: Trivial regurgitation. - Mitral valve: Calcified annulus. - Right ventricle: The cavity size was moderately dilated. Systolic function was mildly reduced. - Right atrium: The atrium was mildly dilated. - Atrial septum: A septal defect cannot be excluded. - Tricuspid valve: Moderate-severe regurgitation directed centrally. - Pulmonary arteries: PA peak pressure: 13mm Hg (S). Transthoracic echocardiography. M-mode, complete 2D, spectral Doppler, and color Doppler. Height: Height: 160cm. Height: 63in. Weight: Weight: 56.7kg. Weight: 124.7lb. Body mass index: BMI: 22.1kg/m^2. Body surface area:  BSA: 1.77m^2. Patient status: Inpatient. Location: ICU/CCU Basic Metabolic Panel:  Recent Labs  06/16/14 07/05/14 08/02/14  NA 139 140 140  K 4.4 4.3 3.8  BUN 22* 23* 17  CREATININE 0.9 0.9 0.9   Liver Function Tests: No results for input(s): AST, ALT, ALKPHOS, BILITOT,  PROT, ALBUMIN in the last 8760 hours. No results for input(s): LIPASE, AMYLASE in the last 8760 hours. No results for input(s): AMMONIA in the last 8760 hours. CBC:  Recent Labs  03/22/14 06/16/14  WBC 6.4 6.0  HGB 10.5* 12.9  HCT 32* 38  PLT 278 235   CBG: No results for input(s): GLUCAP in the last 8760 hours. TSH: No results for input(s): TSH in the last 8760 hours. A1C: Lab Results  Component Value Date   HGBA1C 6.1* 08/10/2012      Assessment/Plan  1. Essential hypertension Stable. Goal BP 150/90 or less. Continue current meds and monitor BMP periodically  2. Gastroesophageal reflux disease without esophagitis Stable off meds  3. OAB (overactive bladder) Stable, continue current regimen  4. Depression Continues with symptoms of situation depression. We discussed finding a greater meaning to her life and participating in activities  5. Generalized anxiety disorder Increase Buspar to  30 mg BID  6. AFIB Rate controlled. Currently on eliquis for CVA risk reduction.      Cindi Carbon, ANP [facility] [phone]

## 2015-02-13 ENCOUNTER — Encounter: Payer: Self-pay | Admitting: Adult Health

## 2015-02-13 ENCOUNTER — Non-Acute Institutional Stay (SKILLED_NURSING_FACILITY): Payer: Medicare Other | Admitting: Adult Health

## 2015-02-13 DIAGNOSIS — M21371 Foot drop, right foot: Secondary | ICD-10-CM

## 2015-02-13 DIAGNOSIS — M4806 Spinal stenosis, lumbar region: Secondary | ICD-10-CM | POA: Diagnosis not present

## 2015-02-13 DIAGNOSIS — I2699 Other pulmonary embolism without acute cor pulmonale: Secondary | ICD-10-CM

## 2015-02-13 DIAGNOSIS — I10 Essential (primary) hypertension: Secondary | ICD-10-CM

## 2015-02-13 DIAGNOSIS — F329 Major depressive disorder, single episode, unspecified: Secondary | ICD-10-CM

## 2015-02-13 DIAGNOSIS — I2510 Atherosclerotic heart disease of native coronary artery without angina pectoris: Secondary | ICD-10-CM | POA: Diagnosis not present

## 2015-02-13 DIAGNOSIS — M48061 Spinal stenosis, lumbar region without neurogenic claudication: Secondary | ICD-10-CM

## 2015-02-13 DIAGNOSIS — F32A Depression, unspecified: Secondary | ICD-10-CM

## 2015-02-14 LAB — BASIC METABOLIC PANEL
BUN: 13 mg/dL (ref 4–21)
Creatinine: 0.8 mg/dL (ref 0.5–1.1)
Glucose: 84 mg/dL
Potassium: 3.8 mmol/L (ref 3.4–5.3)
Sodium: 140 mmol/L (ref 137–147)

## 2015-02-14 LAB — CBC AND DIFFERENTIAL
HCT: 40 % (ref 36–46)
Hemoglobin: 13.1 g/dL (ref 12.0–16.0)
PLATELETS: 207 10*3/uL (ref 150–399)
WBC: 5.5 10^3/mL

## 2015-02-16 ENCOUNTER — Other Ambulatory Visit: Payer: Self-pay | Admitting: *Deleted

## 2015-02-16 MED ORDER — LORAZEPAM 1 MG PO TABS: ORAL_TABLET | ORAL | Status: DC

## 2015-02-16 NOTE — Progress Notes (Signed)
Patient ID: Cathy Wallace, female   DOB: 09-Feb-1917, 81 y.o.   MRN: [id number]     Nursing Home Location:  [name]   Code Status: DNR   Place of Service: SNF (31)  Chief Complaint  Patient presents with  . Medical Management of Chronic Issues    HPI:  81 y.o.  Female residing at [facility], skilled care section. I am here to review her chronic medical issues. 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  She has no complaints today. She reports that she misses being able to play bridge and ambulate. She is currently in a brace and can no longer bear weight due to a right foot drop. The etiology for this is unclear but may be due to an L5 radiculopathy. She reports periods of anxiety and depression related to living in skilled care and not being able to do the things that she used to do. She reports that celexa and ativan are helping.   Review of Systems:  Review of Systems  Constitutional: Negative for fever, chills, appetite change and fatigue.  HENT: Negative for congestion and trouble swallowing.   Respiratory: Negative for cough and shortness of breath.   Cardiovascular: Negative for chest pain, palpitations and leg swelling.  Gastrointestinal: Negative for abdominal pain, constipation and abdominal distention.  Genitourinary: Negative for dysuria, flank pain and difficulty urinating.       Incontinence  Skin: Negative for rash and wound.  Neurological: Positive for numbness. Negative for dizziness, tremors, seizures, syncope and speech difficulty.  Psychiatric/Behavioral: Positive for dysphoric mood. Negative for suicidal ideas, sleep disturbance and agitation. The patient is nervous/anxious.     Medications: Patient's Medications  New Prescriptions   No medications on file  Previous Medications   ACETAMINOPHEN (TYLENOL) 325 MG TABLET    Take 2 tablets (650  mg total) by mouth 3 (three) times daily.   APIXABAN (ELIQUIS) 2.5 MG TABS TABLET    Take 1 tablet (2.5 mg total) by mouth 2 (two) times daily.   BUSPIRONE (BUSPAR) 30 MG TABLET    Take 30 mg by mouth 2 (two) times daily.   CALCIUM CARBONATE (TUMS - DOSED IN MG ELEMENTAL CALCIUM) 500 MG CHEWABLE TABLET    Chew 1 tablet by mouth 2 (two) times daily.   CHOLECALCIFEROL (VITAMIN D) 1000 UNITS TABLET    Take 2,000 Units by mouth daily.   CITALOPRAM (CELEXA) 40 MG TABLET    Take 40 mg by mouth daily.   DILTIAZEM (CARDIZEM CD) 180 MG 24 HR CAPSULE    Take 1 capsule (180 mg total) by mouth daily after lunch.   DOXAZOSIN (CARDURA) 8 MG TABLET    Take 8 mg by mouth daily.   FERROUS SULFATE 325 (65 FE) MG TABLET    Take 1 tablet (325 mg total) by mouth daily with breakfast.   FLUTICASONE (VERAMYST) 27.5 MCG/SPRAY NASAL SPRAY    Place 2 sprays into the nose daily.   HYDROCODONE-ACETAMINOPHEN (NORCO/VICODIN) 5-325 MG PER TABLET    Take 1 tablet by mouth every 6 (six) hours as needed for severe pain.   LISINOPRIL (PRINIVIL,ZESTRIL) 40 MG TABLET    Take 40 mg by mouth daily.   LOPERAMIDE (IMODIUM A-D) 2 MG TABLET    Take 2 mg by mouth 4 (four) times daily as needed for diarrhea or loose stools.   LORATADINE (CLARITIN) 10 MG TABLET    Take  10 mg by mouth daily as needed for allergies.    MELATONIN 3 MG TABS    Take 1 tablet by mouth at bedtime.   MIRABEGRON ER (MYRBETRIQ) 25 MG TB24 TABLET    Take 25 mg by mouth daily.   NITROGLYCERIN (NITROSTAT) 0.4 MG SL TABLET    Place 1 tablet (0.4 mg total) under the tongue every 5 (five) minutes x 3 doses as needed for chest pain.   POTASSIUM CHLORIDE (K-DUR) 10 MEQ TABLET    Take 10 mEq by mouth daily.  Modified Medications   Modified Medication Previous Medication   LORAZEPAM (ATIVAN) 1 MG TABLET LORazepam (ATIVAN) 1 MG tablet      Take one tablet by mouth in the morning for anxiety; Take two tablets by mouth at bedtime to help rest    Take 0.5-2 mg by mouth 2 (two)  times daily. Takes 0.5mg  as needed, 1mg  in am, 2mg  at bedtime  Discontinued Medications   No medications on file     Physical Exam:  Filed Vitals:   02/13/15 1602  BP: 159/70  Pulse: 62  Temp: 98.2 F (36.8 C)  Resp: 20  Weight: 103 lb 11.2 oz (47.038 kg)  SpO2: 95%    Physical Exam  Constitutional: She is oriented to person, place, and time. No distress.  Cardiovascular: Normal rate and regular rhythm.   No murmur heard. No edema  Pulmonary/Chest: Effort normal and breath sounds normal. No respiratory distress.  Abdominal: Soft. Bowel sounds are normal. She exhibits no distension. There is no tenderness.  Musculoskeletal: She exhibits no edema or tenderness.  Right foot drop  Neurological: She is alert and oriented to person, place, and time.  Skin: Skin is warm and dry. She is not diaphoretic.  Psychiatric: Affect normal.    Labs reviewed/Significant Diagnostic Results: 2D echo 2014  ------------------------------------------------------------ Study Conclusions  - Left ventricle: Systolic function was normal. The estimated ejection fraction was in the range of 55% to 60%. Although no diagnostic regional wall motion abnormality was identified, this possibility cannot be completely excluded on the basis of this study. Doppler parameters are consistent with abnormal left ventricular relaxation (grade 1 diastolic dysfunction). - Ventricular septum: The contour showed diastolic flattening. These changes are consistent with RV volume overload. - Aortic valve: Trivial regurgitation. - Mitral valve: Calcified annulus. - Right ventricle: The cavity size was moderately dilated. Systolic function was mildly reduced. - Right atrium: The atrium was mildly dilated. - Atrial septum: A septal defect cannot be excluded. - Tricuspid valve: Moderate-severe regurgitation directed centrally. - Pulmonary arteries: PA peak pressure: 15mm Hg (S). Transthoracic  echocardiography. M-mode, complete 2D, spectral Doppler, and color Doppler. Height: Height: 160cm. Height: 63in. Weight: Weight: 56.7kg. Weight: 124.7lb. Body mass index: BMI: 22.1kg/m^2. Body surface area:  BSA: 1.48m^2. Patient status: Inpatient. Location: ICU/CCU Basic Metabolic Panel:  Recent Labs  06/16/14 07/05/14 08/02/14  NA 139 140 140  K 4.4 4.3 3.8  BUN 22* 23* 17  CREATININE 0.9 0.9 0.9   Liver Function Tests: No results for input(s): AST, ALT, ALKPHOS, BILITOT, PROT, ALBUMIN in the last 8760 hours. No results for input(s): LIPASE, AMYLASE in the last 8760 hours. No results for input(s): AMMONIA in the last 8760 hours. CBC:  Recent Labs  03/22/14 06/16/14  WBC 6.4 6.0  HGB 10.5* 12.9  HCT 32* 38  PLT 278 235   CBG: No results for input(s): GLUCAP in the last 8760 hours. TSH: No results for input(s): TSH in the last  8760 hours. A1C: Lab Results  Component Value Date   HGBA1C 6.1* 08/10/2012      Assessment/Plan  1. Pulmonary embolism, Jan 2012 -recurrent problem so she is on long term anticoagulation with eliquis   2. Coronary artery disease involving native coronary artery of native heart without angina pectoris -no symptoms, continue current regimen  3. Right foot drop -no pain, uses brace, possibly due to L5 radiculopathy  4. Depression -has periods of depression and anxiety that are situational -would not change current meds  5. Spinal stenosis of lumbar region -denies pain, no currently on meds  6. Essential hypertension -controlled, check BMP  .   Labs BMP, CBC    Cindi Carbon, [place] [phone]

## 2015-02-16 NOTE — Telephone Encounter (Signed)
[facility] 

## 2015-02-17 ENCOUNTER — Other Ambulatory Visit: Payer: Self-pay | Admitting: *Deleted

## 2015-02-17 MED ORDER — LORAZEPAM 1 MG PO TABS: ORAL_TABLET | ORAL | Status: DC

## 2015-02-17 NOTE — Telephone Encounter (Signed)
[facility] 

## 2015-03-23 ENCOUNTER — Encounter: Payer: Self-pay | Admitting: Adult Health

## 2015-03-23 ENCOUNTER — Non-Acute Institutional Stay (SKILLED_NURSING_FACILITY): Payer: Medicare Other | Admitting: Adult Health

## 2015-03-23 DIAGNOSIS — N3281 Overactive bladder: Secondary | ICD-10-CM | POA: Diagnosis not present

## 2015-03-23 DIAGNOSIS — D649 Anemia, unspecified: Secondary | ICD-10-CM

## 2015-03-23 DIAGNOSIS — R413 Other amnesia: Secondary | ICD-10-CM

## 2015-03-23 DIAGNOSIS — I10 Essential (primary) hypertension: Secondary | ICD-10-CM

## 2015-03-23 DIAGNOSIS — I2699 Other pulmonary embolism without acute cor pulmonale: Secondary | ICD-10-CM

## 2015-03-24 DIAGNOSIS — R413 Other amnesia: Secondary | ICD-10-CM | POA: Insufficient documentation

## 2015-03-24 LAB — BASIC METABOLIC PANEL
BUN: 12 mg/dL (ref 4–21)
CREATININE: 0.8 mg/dL (ref 0.5–1.1)
Glucose: 81 mg/dL
POTASSIUM: 3.8 mmol/L (ref 3.4–5.3)
SODIUM: 140 mmol/L (ref 137–147)

## 2015-03-24 NOTE — Progress Notes (Signed)
Patient ID: Cathy Wallace, female   DOB: 01/22/1917, 81 y.o.   MRN: [id number]     Nursing Home Location:  [name]   Code Status: DNR   Place of Service: SNF (31)  Chief Complaint  Patient presents with  . Medical Management of Chronic Issues    HPI:  81 y.o.  Female residing at [facility], skilled care section. I am here to review her chronic medical issues. 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  The resident care giver is reporting that she has been more forgetful over the past few weeks. She remains functionally the same. Her last MMSE on 01/03/15 was 24/30.  She is not on meds for memory loss per her request and her age. Her sitter says she continues to read books every day.  Functional status: One person assist to the chair, non ambulatory due to foot drop  Review of Systems:  Review of Systems  Constitutional: Negative for fever, chills, appetite change and fatigue.  HENT: Negative for congestion and trouble swallowing.   Respiratory: Negative for cough and shortness of breath.   Cardiovascular: Negative for chest pain, palpitations and leg swelling.  Gastrointestinal: Negative for abdominal pain, constipation and abdominal distention.  Genitourinary: Negative for dysuria, flank pain and difficulty urinating.       Incontinence  Musculoskeletal: Positive for gait problem. Negative for arthralgias.  Skin: Negative for rash and wound.  Neurological: Positive for numbness (right foot drop). Negative for dizziness, tremors, seizures, syncope and speech difficulty.  Psychiatric/Behavioral: Positive for dysphoric mood. Negative for suicidal ideas, sleep disturbance and agitation. The patient is nervous/anxious.        Memory loss    Medications: Patient's Medications  New Prescriptions   No medications on file  Previous Medications   ACETAMINOPHEN (TYLENOL)  325 MG TABLET    Take 2 tablets (650 mg total) by mouth 3 (three) times daily.   APIXABAN (ELIQUIS) 2.5 MG TABS TABLET    Take 1 tablet (2.5 mg total) by mouth 2 (two) times daily.   BUSPIRONE (BUSPAR) 30 MG TABLET    Take 30 mg by mouth 2 (two) times daily.   CALCIUM CARBONATE (TUMS - DOSED IN MG ELEMENTAL CALCIUM) 500 MG CHEWABLE TABLET    Chew 1 tablet by mouth 2 (two) times daily.   CHOLECALCIFEROL (VITAMIN D) 1000 UNITS TABLET    Take 1,000 Units by mouth daily.    CITALOPRAM (CELEXA) 40 MG TABLET    Take 40 mg by mouth daily.   DILTIAZEM (CARDIZEM CD) 180 MG 24 HR CAPSULE    Take 1 capsule (180 mg total) by mouth daily after lunch.   DOXAZOSIN (CARDURA) 8 MG TABLET    Take 8 mg by mouth daily.   FLUTICASONE (VERAMYST) 27.5 MCG/SPRAY NASAL SPRAY    Place 2 sprays into the nose daily.   HYDROCODONE-ACETAMINOPHEN (NORCO/VICODIN) 5-325 MG PER TABLET    Take 1 tablet by mouth every 6 (six) hours as needed for severe pain.   LISINOPRIL (PRINIVIL,ZESTRIL) 40 MG TABLET    Take 40 mg by mouth daily.   LOPERAMIDE (IMODIUM A-D) 2 MG TABLET    Take 2 mg by mouth. Take 2 mg four times daily as needed for diarrhea or loose stools   LORATADINE (CLARITIN) 10 MG TABLET    Take 10 mg by mouth daily as needed for allergies.    LORAZEPAM (ATIVAN) 1  MG TABLET    Take one tablet by mouth in the morning for anxiety; Take two tablets by mouth at bedtime to help rest   MELATONIN 3 MG TABS    Take 1 tablet by mouth at bedtime.   MIRABEGRON ER (MYRBETRIQ) 25 MG TB24 TABLET    Take 25 mg by mouth daily.   NITROGLYCERIN (NITROSTAT) 0.4 MG SL TABLET    Place 1 tablet (0.4 mg total) under the tongue every 5 (five) minutes x 3 doses as needed for chest pain.   POTASSIUM CHLORIDE (K-DUR) 10 MEQ TABLET    Take 10 mEq by mouth daily.  Modified Medications   No medications on file  Discontinued Medications   FERROUS SULFATE 325 (65 FE) MG TABLET    Take 1 tablet (325 mg total) by mouth daily with breakfast.     Physical  Exam:  Filed Vitals:   03/24/15 1049  BP: 148/68  Pulse: 63  Temp: 98.2 F (36.8 C)  Resp: 20  Weight: 101 lb (45.813 kg)  SpO2: 96%    Physical Exam  Constitutional: She is oriented to person, place, and time. No distress.  HENT:  Head: Normocephalic and atraumatic.  Mouth/Throat: No oropharyngeal exudate.  Eyes: Conjunctivae and EOM are normal. Pupils are equal, round, and reactive to light.  Neck: No JVD present. No thyromegaly present.  Cardiovascular: Normal rate and regular rhythm.   No murmur heard. No edema  Pulmonary/Chest: Effort normal and breath sounds normal. No respiratory distress.  Abdominal: Soft. Bowel sounds are normal. She exhibits no distension. There is no tenderness.  Musculoskeletal: She exhibits no edema or tenderness.  Right foot drop  Neurological: She is alert and oriented to person, place, and time. No cranial nerve deficit. Coordination normal.  Skin: Skin is warm and dry. She is not diaphoretic.  Psychiatric: Affect normal.    Labs reviewed/Significant Diagnostic Results: 2D echo 2014  ------------------------------------------------------------ Study Conclusions  - Left ventricle: Systolic function was normal. The estimated ejection fraction was in the range of 55% to 60%. Although no diagnostic regional wall motion abnormality was identified, this possibility cannot be completely excluded on the basis of this study. Doppler parameters are consistent with abnormal left ventricular relaxation (grade 1 diastolic dysfunction). - Ventricular septum: The contour showed diastolic flattening. These changes are consistent with RV volume overload. - Aortic valve: Trivial regurgitation. - Mitral valve: Calcified annulus. - Right ventricle: The cavity size was moderately dilated. Systolic function was mildly reduced. - Right atrium: The atrium was mildly dilated. - Atrial septum: A septal defect cannot be excluded. -  Tricuspid valve: Moderate-severe regurgitation directed centrally. - Pulmonary arteries: PA peak pressure: 63mm Hg (S). Transthoracic echocardiography. M-mode, complete 2D, spectral Doppler, and color Doppler. Height: Height: 160cm. Height: 63in. Weight: Weight: 56.7kg. Weight: 124.7lb. Body mass index: BMI: 22.1kg/m^2. Body surface area:  BSA: 1.79m^2. Patient status: Inpatient. Location: ICU/CCU Basic Metabolic Panel:  Recent Labs  07/05/14 08/02/14 02/14/15  NA 140 140 140  K 4.3 3.8 3.8  BUN 23* 17 13  CREATININE 0.9 0.9 0.8   Liver Function Tests: No results for input(s): AST, ALT, ALKPHOS, BILITOT, PROT, ALBUMIN in the last 8760 hours. No results for input(s): LIPASE, AMYLASE in the last 8760 hours. No results for input(s): AMMONIA in the last 8760 hours. CBC:  Recent Labs  06/16/14 02/14/15  WBC 6.0 5.5  HGB 12.9 13.1  HCT 38 40  PLT 235 207   CBG: No results for input(s): GLUCAP in the  last 8760 hours. TSH: No results for input(s): TSH in the last 8760 hours. A1C: Lab Results  Component Value Date   HGBA1C 6.1* 08/10/2012      Assessment/Plan  1. Anemia, unspecified anemia type -started on ferrous sulfate after anemia from a hip fx/repair -Hgb has been stable, will d/c and check f/u CBC  2. Memory loss -worsening over time no acute pathology noted today -not on meds, see HPI  3. OAB (overactive bladder) -on Myrbetriq for this with improvement in symptoms.  -she is concerned that she is not urinating enough, will check BMP to eval for hydration (contributing to memory loss?)  4. Essential hypertension -controlled, continue current meds -goal BP <150/90 at her age  8. Pulmonary embolism, Jan 2012 -no new events, continue eliquis    Cindi Carbon, [place] [phone]

## 2015-03-27 ENCOUNTER — Encounter: Payer: Self-pay | Admitting: Adult Health

## 2015-03-27 ENCOUNTER — Non-Acute Institutional Stay (SKILLED_NURSING_FACILITY): Payer: Medicare Other | Admitting: Adult Health

## 2015-03-27 DIAGNOSIS — M25512 Pain in left shoulder: Secondary | ICD-10-CM

## 2015-03-27 NOTE — Progress Notes (Signed)
Patient ID: Cathy Wallace, female   DOB: 1916/12/11, 81 y.o.   MRN: [id number]     Nursing Home Location:     Code Status: DNR  [name], DO as PCP - General (Geriatric Medicine) [facility] [place] of Service: SNF [phone])  Chief Complaint  Patient presents with  . Acute Visit    left shoulder pain    HPI:  81 y.o.female  residing at [facility], skilled care section. I was asked to see her today due to left shoulder pain present for 2 days. She uses her upper body strength to push her self out of the wheel chair and transfer to the chair things that is how she hurt herself. She reported that the pain improved today and that it hurts when she coughs.  She has not tried therapeutic modalities to relieve the pain. She has not had a temp, chest pain, SOB, DOE, palpitations, etc.      Review of Systems:  Review of Systems  Constitutional: Negative for fever, chills, diaphoresis, activity change, appetite change, fatigue and unexpected weight change.  Respiratory: Negative for shortness of breath.   Cardiovascular: Negative for chest pain, palpitations and leg swelling.  Musculoskeletal: Positive for arthralgias and gait problem. Negative for myalgias.    Medications: Patient's Medications  New Prescriptions   No medications on file  Previous Medications   ACETAMINOPHEN (TYLENOL) 325 MG TABLET    Take 2 tablets (650 mg total) by mouth 3 (three) times daily.   APIXABAN (ELIQUIS) 2.5 MG TABS TABLET    Take 1 tablet (2.5 mg total) by mouth 2 (two) times daily.   BUSPIRONE (BUSPAR) 30 MG TABLET    Take 30 mg by mouth 2 (two) times daily.   CALCIUM CARBONATE (TUMS - DOSED IN MG ELEMENTAL CALCIUM) 500 MG CHEWABLE TABLET    Chew 1 tablet by mouth 2 (two) times daily.   CHOLECALCIFEROL (VITAMIN D) 1000 UNITS TABLET    Take 1,000 Units by mouth daily.    CITALOPRAM (CELEXA) 40 MG TABLET     Take 40 mg by mouth daily.   DILTIAZEM (CARDIZEM CD) 180 MG 24 HR CAPSULE    Take 1 capsule (180 mg total) by mouth daily after lunch.   DOXAZOSIN (CARDURA) 8 MG TABLET    Take 8 mg by mouth daily.   FLUTICASONE (VERAMYST) 27.5 MCG/SPRAY NASAL SPRAY    Place 2 sprays into the nose daily.   HYDROCODONE-ACETAMINOPHEN (NORCO/VICODIN) 5-325 MG PER TABLET    Take 1 tablet by mouth every 6 (six) hours as needed for severe pain.   LISINOPRIL (PRINIVIL,ZESTRIL) 40 MG TABLET    Take 40 mg by mouth daily.   LOPERAMIDE (IMODIUM A-D) 2 MG TABLET    Take 2 mg by mouth. Take 2 mg four times daily as needed for diarrhea or loose stools   LORATADINE (CLARITIN) 10 MG TABLET    Take 10 mg by mouth daily as needed for allergies.    LORAZEPAM (ATIVAN) 1 MG TABLET    Take one tablet by mouth in the morning for anxiety; Take two tablets by mouth at bedtime to help rest   MELATONIN 3 MG TABS    Take 1 tablet by mouth at bedtime.   MIRABEGRON ER (MYRBETRIQ) 25 MG TB24 TABLET    Take 25 mg by mouth daily.   NITROGLYCERIN (NITROSTAT) 0.4 MG SL TABLET    Place 1 tablet (0.4 mg  total) under the tongue every 5 (five) minutes x 3 doses as needed for chest pain.   POTASSIUM CHLORIDE (K-DUR) 10 MEQ TABLET    Take 10 mEq by mouth daily.  Modified Medications   No medications on file  Discontinued Medications   No medications on file     Physical Exam:  There were no vitals filed for this visit.  Physical Exam  Constitutional: She is oriented to person, place, and time. No distress.  Cardiovascular: Normal rate and regular rhythm.   Pulmonary/Chest: Effort normal and breath sounds normal. No respiratory distress. She has no wheezes.  Musculoskeletal: Normal range of motion. She exhibits no edema or tenderness.  Crepitus with ROM of the left shoulder, strength 4/5, no pain on palpation  Neurological: She is alert and oriented to person, place, and time.  Skin: She is not diaphoretic.    Wt Readings from Last 3  Encounters:  03/24/15 101 lb (45.813 kg)  02/13/15 103 lb 11.2 oz (47.038 kg)  12/15/14 106 lb 3.2 oz (48.172 kg)     Labs reviewed/Significant Diagnostic Results:  Basic Metabolic Panel:  Recent Labs  07/05/14 08/02/14 02/14/15  NA 140 140 140  K 4.3 3.8 3.8  BUN 23* 17 13  CREATININE 0.9 0.9 0.8   Liver Function Tests: No results for input(s): AST, ALT, ALKPHOS, BILITOT, PROT, ALBUMIN in the last 8760 hours. No results for input(s): LIPASE, AMYLASE in the last 8760 hours. No results for input(s): AMMONIA in the last 8760 hours. CBC:  Recent Labs  06/16/14 02/14/15  WBC 6.0 5.5  HGB 12.9 13.1  HCT 38 40  PLT 235 207   CBG: No results for input(s): GLUCAP in the last 8760 hours. TSH: No results for input(s): TSH in the last 8760 hours. A1C: Lab Results  Component Value Date   HGBA1C 6.1* 08/10/2012   Lipid Panel: No results for input(s): CHOL, HDL, LDLCALC, TRIG, CHOLHDL, LDLDIRECT in the last 8760 hours.     Assessment/Plan  1) Shoulder pain -improved since first reported, mostly likely a muscle strain -offered ice and voltaren gel but she refused -she will report if the pain worsens or her ROM decreased     Cindi Carbon, [place] [phone]

## 2015-04-17 ENCOUNTER — Non-Acute Institutional Stay (SKILLED_NURSING_FACILITY): Payer: Medicare Other | Admitting: Adult Health

## 2015-04-17 ENCOUNTER — Encounter: Payer: Self-pay | Admitting: Adult Health

## 2015-04-17 DIAGNOSIS — F329 Major depressive disorder, single episode, unspecified: Secondary | ICD-10-CM | POA: Diagnosis not present

## 2015-04-17 DIAGNOSIS — F411 Generalized anxiety disorder: Secondary | ICD-10-CM

## 2015-04-17 NOTE — Progress Notes (Signed)
Patient ID: Cathy Wallace, female   DOB: 01-27-1917, 81 y.o.   MRN: [id number]     Nursing Home Location:  [facility]   Code Status: DNR  [name], DO as PCP - General (Geriatric Medicine) [facility] [place] of Service: SNF [phone])  Chief Complaint  Patient presents with  . Acute Visit    anxiety    HPI:  81 y.o.female  residing at [facility], skilled care section. I was asked to see her today due to increasing anxiety. She has a hx of depression and anxiety.  She has been on Ativan 1mg  in the am and 2 mg in the pm. She has also used the prn dose during the day and at midnight for anxiety. She has caregivers that change sometimes and this produces anxiety.  She also reports that she would like to go to sleep and not wake up in the morning. She does not feel that she is "living".  She spends most of her time in her room and does not participate in activities. She is anxious about her memory declining over time. She also reports anxiety over watching the news.      Review of Systems:  Review of Systems  Constitutional: Negative for fever, chills, diaphoresis, activity change, appetite change, fatigue and unexpected weight change.  Respiratory: Negative for shortness of breath.   Cardiovascular: Negative for chest pain, palpitations and leg swelling.  Musculoskeletal: Positive for arthralgias and gait problem. Negative for myalgias.  Psychiatric/Behavioral: Positive for sleep disturbance, dysphoric mood and agitation. Negative for suicidal ideas and confusion. The patient is nervous/anxious.        Memory loss    Medications: Patient's Medications  New Prescriptions   No medications on file  Previous Medications   ACETAMINOPHEN (TYLENOL) 325 MG TABLET    Take 2 tablets (650 mg total) by mouth 3 (three) times daily.   APIXABAN (ELIQUIS) 2.5 MG TABS TABLET    Take 1 tablet (2.5 mg total) by  mouth 2 (two) times daily.   BUSPIRONE (BUSPAR) 30 MG TABLET    Take 30 mg by mouth 2 (two) times daily.   CALCIUM CARBONATE (TUMS - DOSED IN MG ELEMENTAL CALCIUM) 500 MG CHEWABLE TABLET    Chew 1 tablet by mouth 2 (two) times daily.   CHOLECALCIFEROL (VITAMIN D) 1000 UNITS TABLET    Take 1,000 Units by mouth daily.    CITALOPRAM (CELEXA) 40 MG TABLET    Take 40 mg by mouth daily.   DILTIAZEM (CARDIZEM CD) 180 MG 24 HR CAPSULE    Take 1 capsule (180 mg total) by mouth daily after lunch.   DOXAZOSIN (CARDURA) 8 MG TABLET    Take 8 mg by mouth daily.   FLUTICASONE (VERAMYST) 27.5 MCG/SPRAY NASAL SPRAY    Place 2 sprays into the nose daily.   HYDROCODONE-ACETAMINOPHEN (NORCO/VICODIN) 5-325 MG PER TABLET    Take 1 tablet by mouth every 6 (six) hours as needed for severe pain.   LISINOPRIL (PRINIVIL,ZESTRIL) 40 MG TABLET    Take 40 mg by mouth daily.   LOPERAMIDE (IMODIUM A-D) 2 MG TABLET    Take 2 mg by mouth. Take 2 mg four times daily as needed for diarrhea or loose stools   LORATADINE (CLARITIN) 10 MG TABLET    Take 10 mg by mouth daily as needed for allergies.    LORAZEPAM (ATIVAN) 1 MG TABLET    Take one tablet  by mouth in the morning for anxiety; Take two tablets by mouth at bedtime to help rest   MELATONIN 3 MG TABS    Take 1 tablet by mouth at bedtime.   MIRABEGRON ER (MYRBETRIQ) 25 MG TB24 TABLET    Take 25 mg by mouth daily.   NITROGLYCERIN (NITROSTAT) 0.4 MG SL TABLET    Place 1 tablet (0.4 mg total) under the tongue every 5 (five) minutes x 3 doses as needed for chest pain.   POTASSIUM CHLORIDE (K-DUR) 10 MEQ TABLET    Take 10 mEq by mouth daily.  Modified Medications   No medications on file  Discontinued Medications   No medications on file     Physical Exam:  There were no vitals filed for this visit.  Physical Exam  Constitutional: She is oriented to person, place, and time. No distress.  Neurological: She is alert and oriented to person, place, and time. No cranial nerve  deficit.  Skin: She is not diaphoretic.  Psychiatric: Affect normal.    Wt Readings from Last 3 Encounters:  03/24/15 101 lb (45.813 kg)  02/13/15 103 lb 11.2 oz (47.038 kg)  12/15/14 106 lb 3.2 oz (48.172 kg)     Labs reviewed/Significant Diagnostic Results:  Basic Metabolic Panel:  Recent Labs  07/05/14 08/02/14 02/14/15  NA 140 140 140  K 4.3 3.8 3.8  BUN 23* 17 13  CREATININE 0.9 0.9 0.8   Liver Function Tests: No results for input(s): AST, ALT, ALKPHOS, BILITOT, PROT, ALBUMIN in the last 8760 hours. No results for input(s): LIPASE, AMYLASE in the last 8760 hours. No results for input(s): AMMONIA in the last 8760 hours. CBC:  Recent Labs  06/16/14 02/14/15  WBC 6.0 5.5  HGB 12.9 13.1  HCT 38 40  PLT 235 207   CBG: No results for input(s): GLUCAP in the last 8760 hours. TSH: No results for input(s): TSH in the last 8760 hours. A1C: Lab Results  Component Value Date   HGBA1C 6.1* 08/10/2012   Lipid Panel: No results for input(s): CHOL, HDL, LDLCALC, TRIG, CHOLHDL, LDLDIRECT in the last 8760 hours.     Assessment/Plan  1) Anxiety -worsening over time -increasing Buspar has not helped -I asked her about changing from Celexa to another agent or adding another agent but this seems to create more anxiety -we agreed to add 1 mg of ativan at lunch, which she already uses the 0.5 mg dosing prn anyway -she should make attempts to find strategies to alleviate her anxiety  2) Depression -continue Celexa -consider another agent at f/u visit -no signs of SI   Cindi Carbon, [place] [phone]

## 2015-04-18 ENCOUNTER — Other Ambulatory Visit: Payer: Self-pay | Admitting: *Deleted

## 2015-04-18 MED ORDER — LORAZEPAM 1 MG PO TABS: ORAL_TABLET | ORAL | Status: DC

## 2015-04-18 NOTE — Telephone Encounter (Signed)
[facility] 

## 2015-05-01 ENCOUNTER — Encounter: Payer: Self-pay | Admitting: Adult Health

## 2015-05-01 ENCOUNTER — Non-Acute Institutional Stay (SKILLED_NURSING_FACILITY): Payer: Medicare Other | Admitting: Adult Health

## 2015-05-01 DIAGNOSIS — F411 Generalized anxiety disorder: Secondary | ICD-10-CM | POA: Diagnosis not present

## 2015-05-01 DIAGNOSIS — F329 Major depressive disorder, single episode, unspecified: Secondary | ICD-10-CM | POA: Diagnosis not present

## 2015-05-01 DIAGNOSIS — F32A Depression, unspecified: Secondary | ICD-10-CM

## 2015-05-01 DIAGNOSIS — I10 Essential (primary) hypertension: Secondary | ICD-10-CM | POA: Diagnosis not present

## 2015-05-01 NOTE — Progress Notes (Signed)
Patient ID: Cathy Wallace, female   DOB: August 07, 1916, 81 y.o.   MRN: [id number]     Nursing Home Location:  [name]   Code Status: DNR   Place of Service: SNF (31)  Chief Complaint  Patient presents with  . Acute Visit    anxiety, memory loss  . Medical Management of Chronic Issues    HPI:  81 y.o.  Female residing at [facility], skilled care section. I am here to review her chronic medical issues. 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  It was reported on 10/17 that she was having increased anxiety and using prn ativan, in addition to scheduled dosing of 1 mg in the am and 2mg  in the evening. She reported anxiety about her caregiver schedule and other routine changes. Ativan 1 mg was added at lunch. The nurse today reports improved anxiety. Upon entering the room the resident is very upset that her thermostat is not working and that she can not find her blankets. She continues to report feeling depressed about her overall state of health and the fact that she now lives in skilled care. She spends her time in her room and does not participate in activities. She sleeps fairly well once she is given ativan per the resident.  Review of Systems:  Review of Systems  Constitutional: Negative for fever, chills, appetite change and fatigue.  HENT: Negative for congestion and trouble swallowing.   Respiratory: Negative for cough and shortness of breath.   Cardiovascular: Negative for chest pain, palpitations and leg swelling.  Gastrointestinal: Negative for abdominal pain, constipation and abdominal distention.  Genitourinary: Negative for dysuria, flank pain and difficulty urinating.       Incontinence  Skin: Negative for rash and wound.  Neurological: Positive for numbness. Negative for dizziness, tremors, seizures, syncope and speech difficulty.    Psychiatric/Behavioral: Positive for dysphoric mood. Negative for suicidal ideas, sleep disturbance and agitation. The patient is nervous/anxious.     Medications: Patient's Medications  New Prescriptions   No medications on file  Previous Medications   ACETAMINOPHEN (TYLENOL) 325 MG TABLET    Take 2 tablets (650 mg total) by mouth 3 (three) times daily.   APIXABAN (ELIQUIS) 2.5 MG TABS TABLET    Take 1 tablet (2.5 mg total) by mouth 2 (two) times daily.   BUSPIRONE (BUSPAR) 30 MG TABLET    Take 30 mg by mouth 2 (two) times daily.   CALCIUM CARBONATE (TUMS - DOSED IN MG ELEMENTAL CALCIUM) 500 MG CHEWABLE TABLET    Chew 1 tablet by mouth 2 (two) times daily.   CHOLECALCIFEROL (VITAMIN D) 1000 UNITS TABLET    Take 1,000 Units by mouth daily.    CITALOPRAM (CELEXA) 40 MG TABLET    Take 40 mg by mouth daily.   DILTIAZEM (CARDIZEM CD) 180 MG 24 HR CAPSULE    Take 1 capsule (180 mg total) by mouth daily after lunch.   DOXAZOSIN (CARDURA) 8 MG TABLET    Take 8 mg by mouth daily.   FLUTICASONE (VERAMYST) 27.5 MCG/SPRAY NASAL SPRAY    Place 2 sprays into the nose daily.   HYDROCODONE-ACETAMINOPHEN (NORCO/VICODIN) 5-325 MG PER TABLET    Take 1 tablet by mouth every 6 (six) hours as needed for severe pain.   LISINOPRIL (PRINIVIL,ZESTRIL) 40 MG TABLET    Take 40 mg by mouth daily.   LOPERAMIDE (IMODIUM A-D) 2  MG TABLET    Take 2 mg by mouth. Take 2 mg four times daily as needed for diarrhea or loose stools   LORATADINE (CLARITIN) 10 MG TABLET    Take 10 mg by mouth daily as needed for allergies.    LORAZEPAM (ATIVAN) 1 MG TABLET    Take one tablet by mouth at lunch for anxiety   MELATONIN 3 MG TABS    Take 1 tablet by mouth at bedtime.   MIRABEGRON ER (MYRBETRIQ) 25 MG TB24 TABLET    Take 25 mg by mouth daily.   NITROGLYCERIN (NITROSTAT) 0.4 MG SL TABLET    Place 1 tablet (0.4 mg total) under the tongue every 5 (five) minutes x 3 doses as needed for chest pain.   POTASSIUM CHLORIDE (K-DUR) 10 MEQ  TABLET    Take 10 mEq by mouth daily.  Modified Medications   No medications on file  Discontinued Medications   No medications on file     Physical Exam:  Filed Vitals:   05/01/15 1455  BP: 176/72  Pulse: 61  Temp: 98.1 F (36.7 C)  Resp: 18  Weight: 101 lb 4.8 oz (45.949 kg)  SpO2: 92%   Wt Readings from Last 3 Encounters:  05/01/15 101 lb 4.8 oz (45.949 kg)  03/24/15 101 lb (45.813 kg)  02/13/15 103 lb 11.2 oz (47.038 kg)     Physical Exam  Constitutional: She is oriented to person, place, and time. No distress.  Cardiovascular: Normal rate and regular rhythm.   No murmur heard. No edema  Pulmonary/Chest: Effort normal and breath sounds normal. No respiratory distress.  Abdominal: Soft. Bowel sounds are normal. She exhibits no distension. There is no tenderness.  Musculoskeletal: She exhibits no edema or tenderness.  Right foot drop  Neurological: She is alert and oriented to person, place, and time.  Skin: Skin is warm and dry. She is not diaphoretic.  Psychiatric: Affect normal.    Labs reviewed/Significant Diagnostic Results: 2D echo 2014  ------------------------------------------------------------ Study Conclusions  - Left ventricle: Systolic function was normal. The estimated ejection fraction was in the range of 55% to 60%. Although no diagnostic regional wall motion abnormality was identified, this possibility cannot be completely excluded on the basis of this study. Doppler parameters are consistent with abnormal left ventricular relaxation (grade 1 diastolic dysfunction). - Ventricular septum: The contour showed diastolic flattening. These changes are consistent with RV volume overload. - Aortic valve: Trivial regurgitation. - Mitral valve: Calcified annulus. - Right ventricle: The cavity size was moderately dilated. Systolic function was mildly reduced. - Right atrium: The atrium was mildly dilated. - Atrial septum: A  septal defect cannot be excluded. - Tricuspid valve: Moderate-severe regurgitation directed centrally. - Pulmonary arteries: PA peak pressure: 22mm Hg (S). Transthoracic echocardiography. M-mode, complete 2D, spectral Doppler, and color Doppler. Height: Height: 160cm. Height: 63in. Weight: Weight: 56.7kg. Weight: 124.7lb. Body mass index: BMI: 22.1kg/m^2. Body surface area:  BSA: 1.72m^2. Patient status: Inpatient. Location: ICU/CCU Basic Metabolic Panel:  Recent Labs  07/05/14 08/02/14 02/14/15  NA 140 140 140  K 4.3 3.8 3.8  BUN 23* 17 13  CREATININE 0.9 0.9 0.8   Liver Function Tests: No results for input(s): AST, ALT, ALKPHOS, BILITOT, PROT, ALBUMIN in the last 8760 hours. No results for input(s): LIPASE, AMYLASE in the last 8760 hours. No results for input(s): AMMONIA in the last 8760 hours. CBC:  Recent Labs  06/16/14 02/14/15  WBC 6.0 5.5  HGB 12.9 13.1  HCT 38 40  PLT 235 207   CBG: No results for input(s): GLUCAP in the last 8760 hours. TSH: No results for input(s): TSH in the last 8760 hours. A1C: Lab Results  Component Value Date   HGBA1C 6.1* 08/10/2012      Assessment/Plan  1)Major depression -long term hx, has tried zoloft and celexa -continues to report symptoms -pharmacogenomic testing reveals celexa may not be effective -taper Celexa 20 mg daily for 1 week , then 10 mg daily for 1 week, then d/c -begin Remeron 7.5 mg po qhs x 1 week, then 15 mg daily -psych consult  2) Anxiety disorder -sluggish with scheduled ativan -d/c scheduled lunch dose and continue breakfast and dinner doses the same -continue Buspar  3)HTN -elevated today, other numbers within acceptable range -goal <150/90 -continue cardura, cardizem, and zestril .    Cindi Carbon, ANP [facility] [phone]

## 2015-05-04 LAB — CBC AND DIFFERENTIAL
HEMATOCRIT: 40 % (ref 36–46)
HEMOGLOBIN: 13 g/dL (ref 12.0–16.0)
PLATELETS: 214 10*3/uL (ref 150–399)
WBC: 5.9 10*3/mL

## 2015-05-23 ENCOUNTER — Encounter: Payer: Self-pay | Admitting: Internal Medicine

## 2015-05-23 ENCOUNTER — Non-Acute Institutional Stay (SKILLED_NURSING_FACILITY): Payer: Medicare Other | Admitting: Internal Medicine

## 2015-05-23 DIAGNOSIS — Z7901 Long term (current) use of anticoagulants: Secondary | ICD-10-CM | POA: Diagnosis not present

## 2015-05-23 DIAGNOSIS — I482 Chronic atrial fibrillation, unspecified: Secondary | ICD-10-CM

## 2015-05-23 DIAGNOSIS — N3281 Overactive bladder: Secondary | ICD-10-CM

## 2015-05-23 DIAGNOSIS — Z9109 Other allergy status, other than to drugs and biological substances: Secondary | ICD-10-CM

## 2015-05-23 DIAGNOSIS — F329 Major depressive disorder, single episode, unspecified: Secondary | ICD-10-CM

## 2015-05-23 DIAGNOSIS — Z91048 Other nonmedicinal substance allergy status: Secondary | ICD-10-CM

## 2015-05-23 DIAGNOSIS — I2510 Atherosclerotic heart disease of native coronary artery without angina pectoris: Secondary | ICD-10-CM

## 2015-05-23 NOTE — Progress Notes (Signed)
Patient ID: Cathy Wallace, female   DOB: 1916/10/02, 81 y.o.   MRN: [id number]  Location:  [facility] Provider:  Rexene Edison. Rhyatt Muska, D.O., C.M.D. Hollace Kinnier, DO  Code Status:  DNR Goals of care: Advanced Directive information Does patient have an advance directive?: Yes, Type of Advance Directive: [place];Out of facility DNR (pink MOST or yellow form), Pre-existing out of facility DNR order (yellow form or pink MOST form): Yellow form placed in chart (order not valid for inpatient use);Pink MOST form placed in chart (order not valid for inpatient use)  Chief Complaint  Patient presents with  . Medical Management of Chronic Issues    HPI:  Pt is a 81 y.o. white female seen today for medical management of chronic diseases.  She has h/o PE and DVT on eliquis, prior stroke with left foot drop, CAD, htn, hyperlipidemia, depression and anxiety and overactive bladder. When seen, she did not have complaints.  Staff report she has spells of anxiety and repeating the same questions over and over.  Its unclear if the myrbetriq is helping with her bladder symptoms.  She's had no episodes of chest pain to necessitate use of ntg.  She has caregivers in the mornings for several hours  Review of Systems  Constitutional: Negative for fever and chills.  HENT: Positive for hearing loss.   Eyes: Negative for blurred vision.  Respiratory: Negative for shortness of breath.   Cardiovascular: Negative for chest pain, palpitations and leg swelling.  Gastrointestinal: Negative for abdominal pain, constipation, blood in stool and melena.  Genitourinary: Positive for urgency and frequency. Negative for dysuria.  Musculoskeletal: Negative for falls.  Skin: Negative for rash.  Neurological:       Foot drop  Endo/Heme/Allergies: Bruises/bleeds easily.  Psychiatric/Behavioral: Positive for depression and memory loss. The patient is nervous/anxious.     Past Medical History  Diagnosis  Date  . Heart attack ([place]) "two years ago"  . CAD (coronary artery disease)   . HLD (hyperlipidemia)   . PE (pulmonary embolism)   . DVT (deep vein thrombosis) in pregnancy   . Left foot drop   . Pacemaker   . Breast cancer ([place])   . DVT (deep venous thrombosis) ([place]) 2011     Eliquis anticoagulation  . Pulmonary embolism [facility]) Jan 2012    Eliquis anticoagulation  . HTN (hypertension)   . Spinal stenosis   . Bradycardia, on admit 08/09/2012  . Acute MI ([place]) 08/10/2012    2D Echo - EF 55-60%, normal  . Depression   . GERD (gastroesophageal reflux disease)   . Cancer (HCC)     hx of breast cancer  . Arthritis   . Closed pelvic fracture ([place]) 11/11/2012  . Alcohol dependence ([place]) 11/15/2012  . Long term (current) use of anticoagulants 11/15/2012  . Sacral fracture, closed ([place]) 11/15/2012    Bilateral nondisplaced sacral fractures.   . L5 vertebral fracture ([place]) 11/15/2012    Fracture of the L5 left transverse process.    . Anemia 11/15/2012  . B12 deficiency 12/01/2012  . Insomnia 12/01/2012  . Unspecified vitamin D deficiency 12/07/2012  . Anxiety 2013  . Osteoarthritis 02/10/2013  . Lumbar vertebral fracture (HCC) 06/15/2013    L3-4 12/15/20143  . Anginal pain ([place]) 2014  . Hyperlipemia 2014  . History of fall 2014  . Intertrochanteric fracture of left hip ([place])  11/16/48  . Coronary artery disease 2014  . Urinary tract infection 2015  . Constipation 2015  .  Atherosclerosis 2015  . Cerebral atrophy 2015  . Cerebrovascular disease 2015    Small vessel disease  . Right radial fracture 2014  . Right foot drop 2014  . Unstable gait  2014  . Cardiomegaly 2015  . Fracture of right superior pubic ramus ([place]) 2011    Superior and inferior pubic rami fractures   Past Surgical History  Procedure Laterality Date  . Hip fracture surgery    . Abdominal hysterectomy    . Elbow surgery    . Cataract extraction Bilateral   . Cardiac catheterization  08/08/2012    2.5x46mm Emerge  balloon predilation, 2.75x56mm VeriFLEX bare-metal stent was inserted into the RCA to cover proximal to mid segments of RCA deployed with Noncompliant Sprinter balloon 2.75x74mm up to 2.108mm dilated  . Orif wrist fracture Right 07/03/2013    Procedure: OPEN REDUCTION INTERNAL FIXATION (ORIF) RIGHT WRIST FRACTURE;  Surgeon: Linna Hoff, MD;  Location: [facility];  Service: Orthopedics;  Laterality: Right;  . Intramedullary (im) nail intertrochanteric Left 11/18/2013    Procedure: IM NAIL  LEFT HIP;  Surgeon: Rozanna Box, MD;  Location: [place] [facility];  Service: Orthopedics;  Laterality: Left;  . Left heart catheterization with coronary angiogram N/A 08/08/2012    Procedure: LEFT HEART CATHETERIZATION WITH CORONARY ANGIOGRAM;  Surgeon: Troy Sine, MD;  Location: [facility] CATH LAB;  Service: Cardiovascular;  Laterality: N/A;  . Percutaneous coronary stent intervention (pci-s) N/A 08/08/2012    Procedure: PERCUTANEOUS CORONARY STENT INTERVENTION (PCI-S);  Surgeon: Troy Sine, MD;  Location: [facility] CATH LAB;  Service: Cardiovascular;  Laterality: N/A;    Allergies  Allergen Reactions  . Aspirin Other (See Comments)    taking xarelto  . Codeine Nausea And Vomiting  . Aspirin Other (See Comments)    Pt taking xarelto   . Benadryl [Diphenhydramine] Other (See Comments)    unknown  . Codeine Other (See Comments)    unknown  . Pneumococcal Vaccines Other (See Comments)    unknown  . Benadryl [Diphenhydramine Hcl] Other (See Comments)    unknown  . Flu Virus Vaccine Other (See Comments)    unknown  . Penicillins Rash  . Penicillins Rash  . Pneumococcal Vaccines Other (See Comments)    unknown      Medication List       This list is accurate as of: 05/23/15 11:59 PM.  Always use your most recent med list.               acetaminophen 325 MG tablet  Commonly known as:  TYLENOL  Take 2 tablets (650 mg total) by mouth 3 (three) times daily.     apixaban 2.5 MG Tabs tablet  Commonly known as:   ELIQUIS  Take 1 tablet (2.5 mg total) by mouth 2 (two) times daily.     busPIRone 30 MG tablet  Commonly known as:  BUSPAR  Take 30 mg by mouth 2 (two) times daily.     calcium carbonate 500 MG chewable tablet  Commonly known as:  TUMS - dosed in mg elemental calcium  Chew 1 tablet by mouth 2 (two) times daily.     citalopram 40 MG tablet  Commonly known as:  CELEXA  Take 40 mg by mouth daily.     diltiazem 180 MG 24 hr capsule  Commonly known as:  CARDIZEM CD  Take 1 capsule (180 mg total) by mouth daily after lunch.     doxazosin 8 MG tablet  Commonly known as:  CARDURA  Take 8 mg by mouth daily.     fluticasone 27.5 MCG/SPRAY nasal spray  Commonly known as:  VERAMYST  Place 2 sprays into the nose daily.     HYDROcodone-acetaminophen 5-325 MG tablet  Commonly known as:  NORCO/VICODIN  Take 1 tablet by mouth every 6 (six) hours as needed for severe pain.     lisinopril 40 MG tablet  Commonly known as:  PRINIVIL,ZESTRIL  Take 40 mg by mouth daily.     loperamide 2 MG tablet  Commonly known as:  IMODIUM A-D  Take 2 mg by mouth. Take 2 mg four times daily as needed for diarrhea or loose stools     loratadine 10 MG tablet  Commonly known as:  CLARITIN  Take 10 mg by mouth daily as needed for allergies.     LORazepam 1 MG tablet  Commonly known as:  ATIVAN  Take one tablet by mouth at lunch for anxiety     Melatonin 3 MG Tabs  Take 1 tablet by mouth at bedtime.     mirabegron ER 25 MG Tb24 tablet  Commonly known as:  MYRBETRIQ  Take 25 mg by mouth daily.     nitroGLYCERIN 0.4 MG SL tablet  Commonly known as:  NITROSTAT  Place 1 tablet (0.4 mg total) under the tongue every 5 (five) minutes x 3 doses as needed for chest pain.     potassium chloride 10 MEQ tablet  Commonly known as:  K-DUR  Take 10 mEq by mouth daily.         There is no immunization history on file for this patient. Pertinent  Health Maintenance Due  Topic Date Due  . DEXA SCAN   02/26/1984  . PNA vac Low Risk Adult (1 of 2 - PCV13) 02/26/1984  . INFLUENZA VACCINE  01/30/2016   Fall Risk  03/23/2015 12/15/2014  Falls in the past year? Yes No  Number falls in past yr: 1 -  Risk for fall due to : History of fall(s) Impaired balance/gait    Filed Vitals:   05/23/15 1408  BP: 136/75  Pulse: 60  Temp: 98.4 F (36.9 C)  Resp: 16  Weight: 101 lb 4.8 oz (45.949 kg)  SpO2: 96%   Body mass index is 17.95 kg/(m^2). Physical Exam  Constitutional: She appears well-nourished. No distress.  HENT:  Quite HOH even with hearing aides  Eyes: EOM are normal. Pupils are equal, round, and reactive to light.  Cardiovascular: Normal rate, regular rhythm and normal heart sounds.   Pulmonary/Chest: Effort normal and breath sounds normal. No respiratory distress.  Abdominal: Soft. Bowel sounds are normal. She exhibits no distension. There is no tenderness.  Musculoskeletal:  Left foot drop; sits leaning to the right  Neurological: She is alert.  Psychiatric:  Frequent repeat questions, pleasant    Labs reviewed:  Recent Labs  03/24/15 06/27/15 2354 06/28/15 0600  NA 140 139 139  K 3.8 3.5 3.6  CL  --  107 107  CO2  --  19* 24  GLUCOSE  --  169* 120*  BUN 12 14 14   CREATININE 0.8 0.93 0.90  CALCIUM  --  8.7* 8.6*    Recent Labs  06/28/15 0600  AST 18  ALT 13*  ALKPHOS 77  BILITOT 0.6  PROT 5.2*  ALBUMIN 3.2*    Recent Labs  06/27/15 2354 06/28/15 0600 07/04/15 07/11/15  WBC 11.7* 9.8 8.2 8.5  NEUTROABS 10.2*  --   --   --  HGB 12.4 11.1* 8.6* 9.4*  HCT 38.4 33.7* 26* 28*  MCV 97.0 96.8  --   --   PLT 183 180 266  --    Lab Results  Component Value Date   TSH 2.082 08/10/2012   Lab Results  Component Value Date   HGBA1C 6.1* 08/10/2012   Lab Results  Component Value Date   CHOL  07/26/2010    191        ATP III CLASSIFICATION:  <200     mg/dL   Desirable  200-239  mg/dL   Borderline High  >=240    mg/dL   High          HDL 93  07/26/2010   LDLCALC  07/26/2010    83        Total Cholesterol/HDL:CHD Risk Coronary Heart Disease Risk Table                     Men   Women  1/2 Average Risk   3.4   3.3  Average Risk       5.0   4.4  2 X Average Risk   9.6   7.1  3 X Average Risk  23.4   11.0        Use the calculated Patient Ratio above and the CHD Risk Table to determine the patient's CHD Risk.        ATP III CLASSIFICATION (LDL):  <100     mg/dL   Optimal  100-129  mg/dL   Near or Above                    Optimal  130-159  mg/dL   Borderline  160-189  mg/dL   High  >190     mg/dL   Very High   TRIG 77 07/26/2010   CHOLHDL 2.1 07/26/2010   Assessment/Plan 1. Major depression, chronic (HCC) -maintained on buspar, celexa, and prn ativan  2. OAB (overactive bladder) -continues on myrbetriq, but unclear if this is helping her at this point; still has incontinent episodes  3. Coronary artery disease involving native coronary artery of native heart without angina pectoris -cont bp control, not on lipid meds at this point,  Has not required her ntg, did have prior MI  4. Chronic atrial fibrillation (HCC) -cont cardizem for rate control, eliquis anticoagulation  5. Long term current use of anticoagulant therapy -continues on eliquis for anticoagulation wit her afib, prior pe and prior dvt  6. Environmental allergies -continues on veramyst nasal spray and claritin  Family/ staff Communication: discussed with caregiver and snf nursing  Labs/tests ordered:  No new Quenisha Lovins L. Tacari Repass, D.O. [facility] [address], [place] [address] Cell Phone (Mon-Fri 8am-5pm):  [phone] On Call:  [phone] & follow prompts after 5pm & weekends Office Phone:  [phone] Office Fax:  [phone]

## 2015-06-13 NOTE — Progress Notes (Signed)
This encounter was created in error - please disregard.

## 2015-06-22 ENCOUNTER — Encounter: Payer: Self-pay | Admitting: Adult Health

## 2015-06-22 ENCOUNTER — Non-Acute Institutional Stay (SKILLED_NURSING_FACILITY): Payer: Medicare Other | Admitting: Adult Health

## 2015-06-22 DIAGNOSIS — I2699 Other pulmonary embolism without acute cor pulmonale: Secondary | ICD-10-CM | POA: Diagnosis not present

## 2015-06-22 DIAGNOSIS — M159 Polyosteoarthritis, unspecified: Secondary | ICD-10-CM

## 2015-06-22 DIAGNOSIS — G4700 Insomnia, unspecified: Secondary | ICD-10-CM

## 2015-06-22 DIAGNOSIS — M150 Primary generalized (osteo)arthritis: Secondary | ICD-10-CM

## 2015-06-22 DIAGNOSIS — I10 Essential (primary) hypertension: Secondary | ICD-10-CM | POA: Diagnosis not present

## 2015-06-22 DIAGNOSIS — F411 Generalized anxiety disorder: Secondary | ICD-10-CM

## 2015-06-22 NOTE — Progress Notes (Signed)
Patient ID: Cathy Wallace, female   DOB: 1917/01/22, 81 y.o.   MRN: [id number]     Nursing Home Location:      Code Status: DNR   Place of Service:    No chief complaint on file.   HPI:  81 y.o.  Female residing at [facility], skilled care section. I am here to review her chronic medical issues. 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 She his currently followed by Dr. Casimiro Needle for anxiety and depression. She was started on Zoloft, Vistaril, and Restoril on 11/17, then the Zoloft was increased on 12/15.  She has reported some loose stools that have not been validated by the staff. She reports that she has continued anxiety about her schedule, calling for help, and her bowel regimen. Reports that her insomnia has improved.  Her weight has remained stable at 101 lbs. Her BP has been elevated at times, 136-169/65-57.   Her memory has declined over time, last MMSE 24/30.  She does not wish to receive medicine for this issue. Her family has requested no meds be given that are not necessary, that promote quality of life.  Review of Systems:  Review of Systems  Constitutional: Negative for fever, chills, appetite change and fatigue.  HENT: Negative for congestion and trouble swallowing.   Respiratory: Negative for cough and shortness of breath.   Cardiovascular: Negative for chest pain, palpitations and leg swelling.  Gastrointestinal: Positive for diarrhea (per resident not see by staff). Negative for abdominal pain, constipation and abdominal distention.  Genitourinary: Negative for dysuria, flank pain and difficulty urinating.       Incontinence  Musculoskeletal: Positive for arthralgias and gait problem.  Skin: Negative for rash and wound.  Neurological: Positive for numbness. Negative for dizziness, tremors, seizures, syncope and speech difficulty.  Psychiatric/Behavioral: Positive  for dysphoric mood. Negative for suicidal ideas, sleep disturbance and agitation. The patient is nervous/anxious.     Medications: Patient's Medications  New Prescriptions   No medications on file  Previous Medications   ACETAMINOPHEN (TYLENOL) 325 MG TABLET    Take 2 tablets (650 mg total) by mouth 3 (three) times daily.   APIXABAN (ELIQUIS) 2.5 MG TABS TABLET    Take 1 tablet (2.5 mg total) by mouth 2 (two) times daily.   BUSPIRONE (BUSPAR) 30 MG TABLET    Take 30 mg by mouth 2 (two) times daily.   DILTIAZEM (CARDIZEM CD) 180 MG 24 HR CAPSULE    Take 1 capsule (180 mg total) by mouth daily after lunch.   DOXAZOSIN (CARDURA) 8 MG TABLET    Take 8 mg by mouth daily.   FLUTICASONE (VERAMYST) 27.5 MCG/SPRAY NASAL SPRAY    Place 2 sprays into the nose daily.   HYDROCODONE-ACETAMINOPHEN (NORCO/VICODIN) 5-325 MG PER TABLET    Take 1 tablet by mouth every 6 (six) hours as needed for severe pain.   HYDROXYZINE (ATARAX/VISTARIL) 25 MG TABLET    Take 25 mg by mouth 2 (two) times daily.   LISINOPRIL (PRINIVIL,ZESTRIL) 40 MG TABLET    Take 40 mg by mouth daily.   LOPERAMIDE (IMODIUM A-D) 2 MG TABLET    Take 2 mg by mouth. Take 2 mg four times daily as needed for diarrhea or loose stools   LORATADINE (CLARITIN) 10 MG TABLET    Take 10 mg by mouth daily as needed for allergies.    LORAZEPAM (ATIVAN) 1 MG  TABLET    Take one tablet by mouth at lunch for anxiety   MELATONIN 3 MG TABS    Take 1 tablet by mouth at bedtime.   MIRABEGRON ER (MYRBETRIQ) 25 MG TB24 TABLET    Take 25 mg by mouth daily.   NITROGLYCERIN (NITROSTAT) 0.4 MG SL TABLET    Place 1 tablet (0.4 mg total) under the tongue every 5 (five) minutes x 3 doses as needed for chest pain.   POTASSIUM CHLORIDE (K-DUR) 10 MEQ TABLET    Take 10 mEq by mouth daily.   SERTRALINE (ZOLOFT) 100 MG TABLET    Take 150 mg by mouth daily.   TEMAZEPAM (RESTORIL) 15 MG CAPSULE    Take 15 mg by mouth at bedtime.  Modified Medications   No medications on file    Discontinued Medications   CALCIUM CARBONATE (TUMS - DOSED IN MG ELEMENTAL CALCIUM) 500 MG CHEWABLE TABLET    Chew 1 tablet by mouth 2 (two) times daily.   CITALOPRAM (CELEXA) 40 MG TABLET    Take 40 mg by mouth daily.     Physical Exam:  There were no vitals filed for this visit. Wt Readings from Last 3 Encounters:  05/23/15 101 lb 4.8 oz (45.949 kg)  05/01/15 101 lb 4.8 oz (45.949 kg)  03/24/15 101 lb (45.813 kg)     Physical Exam  Constitutional: She is oriented to person, place, and time. No distress.  Cardiovascular: Normal rate and regular rhythm.   No murmur heard. No edema  Pulmonary/Chest: Effort normal and breath sounds normal. No respiratory distress.  Abdominal: Soft. Bowel sounds are normal. She exhibits no distension. There is no tenderness.  Musculoskeletal: She exhibits no edema or tenderness.  Right foot drop  Neurological: She is alert and oriented to person, place, and time.  Skin: Skin is warm and dry. She is not diaphoretic.  Psychiatric: Affect normal.    Labs reviewed/Significant Diagnostic Results: 2D echo 2014  ------------------------------------------------------------ Study Conclusions  - Left ventricle: Systolic function was normal. The estimated ejection fraction was in the range of 55% to 60%. Although no diagnostic regional wall motion abnormality was identified, this possibility cannot be completely excluded on the basis of this study. Doppler parameters are consistent with abnormal left ventricular relaxation (grade 1 diastolic dysfunction). - Ventricular septum: The contour showed diastolic flattening. These changes are consistent with RV volume overload. - Aortic valve: Trivial regurgitation. - Mitral valve: Calcified annulus. - Right ventricle: The cavity size was moderately dilated. Systolic function was mildly reduced. - Right atrium: The atrium was mildly dilated. - Atrial septum: A septal defect cannot be  excluded. - Tricuspid valve: Moderate-severe regurgitation directed centrally. - Pulmonary arteries: PA peak pressure: 69mm Hg (S). Transthoracic echocardiography. M-mode, complete 2D, spectral Doppler, and color Doppler. Height: Height: 160cm. Height: 63in. Weight: Weight: 56.7kg. Weight: 124.7lb. Body mass index: BMI: 22.1kg/m^2. Body surface area:  BSA: 1.26m^2. Patient status: Inpatient. Location: ICU/CCU Basic Metabolic Panel:  Recent Labs  08/02/14 02/14/15 03/24/15  NA 140 140 140  K 3.8 3.8 3.8  BUN 17 13 12   CREATININE 0.9 0.8 0.8   Liver Function Tests: No results for input(s): AST, ALT, ALKPHOS, BILITOT, PROT, ALBUMIN in the last 8760 hours. No results for input(s): LIPASE, AMYLASE in the last 8760 hours. No results for input(s): AMMONIA in the last 8760 hours. CBC:  Recent Labs  02/14/15 05/04/15  WBC 5.5 5.9  HGB 13.1 13.0  HCT 40 40  PLT 207 214  CBG: No results for input(s): GLUCAP in the last 8760 hours. TSH: No results for input(s): TSH in the last 8760 hours. A1C: Lab Results  Component Value Date   HGBA1C 6.1* 08/10/2012      Assessment/Plan  1) Anxiety -no improvement at this time but will need more time at the current doses -continue current meds, per psych -discussed alternate strategy when anxiety such as deep breathing and participating in activities -she was educated to call the staff for prn ativan if she is unable to calm herself down -check TSH  2) PE -continues on eliquis without s/e -H/H remain stable  3)HTN -elevated at times due to anxiety -no aggressive RX due to goals of care  4) Insomnia -improved  5) OA -no complaints of pain -continue tylenol scheduled     Cindi Carbon, [facility] [phone]

## 2015-06-27 ENCOUNTER — Emergency Department (HOSPITAL_COMMUNITY): Payer: Medicare Other

## 2015-06-27 ENCOUNTER — Inpatient Hospital Stay (HOSPITAL_COMMUNITY)
Admission: EM | Admit: 2015-06-27 | Discharge: 2015-06-28 | DRG: 563 | Disposition: A | Discharge status: Skilled Nursing Facility | Payer: Medicare Other | Attending: Family Medicine | Admitting: Family Medicine

## 2015-06-27 ENCOUNTER — Encounter (HOSPITAL_COMMUNITY): Payer: Self-pay | Admitting: Emergency Medicine

## 2015-06-27 DIAGNOSIS — Z887 Allergy status to serum and vaccine status: Secondary | ICD-10-CM

## 2015-06-27 DIAGNOSIS — K219 Gastro-esophageal reflux disease without esophagitis: Secondary | ICD-10-CM | POA: Diagnosis present

## 2015-06-27 DIAGNOSIS — Z950 Presence of cardiac pacemaker: Secondary | ICD-10-CM | POA: Diagnosis present

## 2015-06-27 DIAGNOSIS — M21372 Foot drop, left foot: Secondary | ICD-10-CM | POA: Diagnosis present

## 2015-06-27 DIAGNOSIS — F329 Major depressive disorder, single episode, unspecified: Secondary | ICD-10-CM | POA: Diagnosis present

## 2015-06-27 DIAGNOSIS — Z885 Allergy status to narcotic agent status: Secondary | ICD-10-CM

## 2015-06-27 DIAGNOSIS — I2510 Atherosclerotic heart disease of native coronary artery without angina pectoris: Secondary | ICD-10-CM | POA: Diagnosis present

## 2015-06-27 DIAGNOSIS — I493 Ventricular premature depolarization: Secondary | ICD-10-CM | POA: Diagnosis present

## 2015-06-27 DIAGNOSIS — Z86711 Personal history of pulmonary embolism: Secondary | ICD-10-CM

## 2015-06-27 DIAGNOSIS — M79601 Pain in right arm: Secondary | ICD-10-CM

## 2015-06-27 DIAGNOSIS — D72829 Elevated white blood cell count, unspecified: Secondary | ICD-10-CM | POA: Diagnosis present

## 2015-06-27 DIAGNOSIS — S42301B Unspecified fracture of shaft of humerus, right arm, initial encounter for open fracture: Secondary | ICD-10-CM | POA: Diagnosis not present

## 2015-06-27 DIAGNOSIS — S42309A Unspecified fracture of shaft of humerus, unspecified arm, initial encounter for closed fracture: Secondary | ICD-10-CM | POA: Diagnosis present

## 2015-06-27 DIAGNOSIS — S0093XA Contusion of unspecified part of head, initial encounter: Secondary | ICD-10-CM | POA: Diagnosis present

## 2015-06-27 DIAGNOSIS — Z86718 Personal history of other venous thrombosis and embolism: Secondary | ICD-10-CM

## 2015-06-27 DIAGNOSIS — I10 Essential (primary) hypertension: Secondary | ICD-10-CM | POA: Diagnosis present

## 2015-06-27 DIAGNOSIS — F419 Anxiety disorder, unspecified: Secondary | ICD-10-CM | POA: Diagnosis present

## 2015-06-27 DIAGNOSIS — Z853 Personal history of malignant neoplasm of breast: Secondary | ICD-10-CM

## 2015-06-27 DIAGNOSIS — O2230 Deep phlebothrombosis in pregnancy, unspecified trimester: Secondary | ICD-10-CM | POA: Diagnosis present

## 2015-06-27 DIAGNOSIS — Z880 Allergy status to penicillin: Secondary | ICD-10-CM

## 2015-06-27 DIAGNOSIS — W06XXXA Fall from bed, initial encounter: Secondary | ICD-10-CM | POA: Diagnosis present

## 2015-06-27 DIAGNOSIS — M4800 Spinal stenosis, site unspecified: Secondary | ICD-10-CM | POA: Diagnosis present

## 2015-06-27 DIAGNOSIS — S42351A Displaced comminuted fracture of shaft of humerus, right arm, initial encounter for closed fracture: Secondary | ICD-10-CM | POA: Diagnosis present

## 2015-06-27 DIAGNOSIS — S42351B Displaced comminuted fracture of shaft of humerus, right arm, initial encounter for open fracture: Secondary | ICD-10-CM

## 2015-06-27 DIAGNOSIS — Z886 Allergy status to analgesic agent status: Secondary | ICD-10-CM

## 2015-06-27 DIAGNOSIS — Z888 Allergy status to other drugs, medicaments and biological substances status: Secondary | ICD-10-CM

## 2015-06-27 DIAGNOSIS — I2699 Other pulmonary embolism without acute cor pulmonale: Secondary | ICD-10-CM | POA: Diagnosis present

## 2015-06-27 DIAGNOSIS — E785 Hyperlipidemia, unspecified: Secondary | ICD-10-CM | POA: Diagnosis present

## 2015-06-27 HISTORY — DX: Hyperlipidemia, unspecified: E78.5

## 2015-06-27 HISTORY — DX: Deep phlebothrombosis in pregnancy, unspecified trimester: O22.30

## 2015-06-27 HISTORY — DX: Other pulmonary embolism without acute cor pulmonale: I26.99

## 2015-06-27 HISTORY — DX: Acute myocardial infarction, unspecified: I21.9

## 2015-06-27 HISTORY — DX: Atherosclerotic heart disease of native coronary artery without angina pectoris: I25.10

## 2015-06-27 HISTORY — DX: Foot drop, left foot: M21.372

## 2015-06-27 HISTORY — DX: Malignant neoplasm of unspecified site of unspecified female breast: C50.919

## 2015-06-27 LAB — CBG MONITORING, ED: Glucose-Capillary: 168 mg/dL — ABNORMAL HIGH (ref 65–99)

## 2015-06-27 MED ORDER — FENTANYL CITRATE (PF) 100 MCG/2ML IJ SOLN
25.0000 ug | Freq: Once | INTRAMUSCULAR | Status: AC
  Administered 2015-06-27: 25 ug via INTRAVENOUS
  Filled 2015-06-27: qty 2

## 2015-06-27 NOTE — ED Notes (Signed)
Pt fell out of bed and hit her head on a metal trash can denting it.  She has an obvious fracture to the right arm.  She was given 166mcg of fentanyl and 168ml of NS.  When given the last 50 of fentanyl she became slightly less responsive.  EKG also shows bigeminal PVC, hx of heart attack w/ stint placement two years per family.  Hx of embolism in her lung about 5 years ago.

## 2015-06-27 NOTE — ED Provider Notes (Signed)
The patient is a 81 year old female, she had a fall presumably out of bed though this was unwitnessed, she has an injury to the right upper extremity in the mid humerus with obvious deformity and an open injury where there is blood coming from a puncture wound at the distal tip of the proximal fragment. She has a hematoma to the side of the head, she has occasional ectopy on auscultation, there is no peripheral edema, soft nontender abdomen, mental status is normal according to family members who are here.  This is a likely open fracture, she will need emergent orthopedic consultation as soon as the x-rays are back, CT scan of the brain as well though I do not think she would be a great surgical candidate for neurosurgical procedure. It may be beneficial for predictive value.   EKG Interpretation  Date/Time:  Tuesday June 27 2015 23:03:07 EST Ventricular Rate:  82 PR Interval:  167 QRS Duration: 86 QT Interval:  511 QTC Calculation: 597 R Axis:   61 Text Interpretation:  Sinus rhythm Ventricular bigeminy Abnormal R-wave progression, early transition LVH with secondary repolarization abnormality No previous ECGs available Confirmed by Lonia Skinner ([id number]) on 06/27/2015 11:07:53 PM        Medical screening examination/treatment/procedure(s) were conducted as a shared visit with non-physician practitioner(s) and myself.  I personally evaluated the patient during the encounter.  Clinical Impression:   Final diagnoses:  Arm pain, right  Comminuted fracture of humerus, right, open, initial encounter  Essential hypertension  HLD (hyperlipidemia)  PE (pulmonary embolism)  DVT (deep vein thrombosis) in pregnancy  Left foot drop  Spinal stenosis, unspecified spinal region  Pacemaker           Noemi Chapel, MD 07/01/15 2225

## 2015-06-27 NOTE — ED Provider Notes (Signed)
CSN: [id number]     Arrival date & time 06/27/15  2255 History   First MD Initiated Contact with Patient 06/27/15 2256     Chief Complaint  Patient presents with  . Fall     (Consider location/radiation/quality/duration/timing/severity/associated sxs/prior Treatment) HPI Comments: Patient with unwitnessed fall at [facility].  EMS found patient lying on floor with obvious deformity to the right upper arm. Initially splinted in position found.  Patient received pain management with fentanyl. Also noted to have large frontal hematoma. Patient does not remember the fall.  Patient is non-ambulatory at baseline, requires assistance with transfers from bed to chair.  Patient denies chest pain, shortness of breath, abdominal pain.  Distal pulse and sensation intact right arm.  Patient is a 81 y.o. female presenting with fall. The history is provided by the patient and the EMS personnel. No language interpreter was used.  Fall This is a new problem. The current episode started today. Associated symptoms include arthralgias. Pertinent negatives include no abdominal pain or chest pain.    Past Medical History  Diagnosis Date  . Heart attack ([place]) "two years ago"   History reviewed. No pertinent past surgical history. No family history on file. Social History  Substance Use Topics  . Smoking status: None  . Smokeless tobacco: None  . Alcohol Use: None   OB History    No data available     Review of Systems  Respiratory: Negative for shortness of breath.   Cardiovascular: Negative for chest pain.  Gastrointestinal: Negative for abdominal pain.  Musculoskeletal: Positive for arthralgias.  Skin: Positive for wound.  All other systems reviewed and are negative.     Allergies  Review of patient's allergies indicates not on file.  Home Medications   Prior to Admission medications   Not on File   Pulse 68  Temp(Src) 97.6 F (36.4 C) (Axillary)  Resp 16  SpO2 94% Physical Exam    HENT:  Head: Normocephalic.    Eyes: Pupils are equal, round, and reactive to light.  Cardiovascular: Intact distal pulses.   Pulmonary/Chest: Effort normal and breath sounds normal.  Abdominal: Soft.  Musculoskeletal: She exhibits edema and tenderness.       Right upper arm: She exhibits tenderness and deformity.       Arms: Neurological: She is alert. GCS eye subscore is 4. GCS verbal subscore is 5. GCS motor subscore is 6.  Skin: Skin is warm and dry.  Psychiatric: She has a normal mood and affect.  Nursing note and vitals reviewed.   ED Course  Procedures (including critical care time) Labs Review Labs Reviewed  CBC WITH DIFFERENTIAL/PLATELET - Abnormal; Notable for the following:    WBC 11.7 (*)    Neutro Abs 10.2 (*)    All other components within normal limits  BASIC METABOLIC PANEL - Abnormal; Notable for the following:    CO2 19 (*)    Glucose, Bld 169 (*)    Calcium 8.7 (*)    GFR calc non Af Amer 50 (*)    GFR calc Af Amer 58 (*)    All other components within normal limits  CBG MONITORING, ED - Abnormal; Notable for the following:    Glucose-Capillary 168 (*)    All other components within normal limits  URINALYSIS, ROUTINE W REFLEX MICROSCOPIC (NOT AT [facility])    Imaging Review Ct Head Wo Contrast  06/27/2015  CLINICAL DATA:  Recent fall with frontal hematoma, initial encounter EXAM: CT HEAD WITHOUT CONTRAST TECHNIQUE:  Contiguous axial images were obtained from the base of the skull through the vertex without intravenous contrast. COMPARISON:  None. FINDINGS: Bony calvarium is intact. A soft tissue hematoma is noted in the frontal region just to the right of the midline. Diffuse atrophic changes and chronic white matter ischemic change is noted. No findings to suggest acute hemorrhage, acute infarction or space-occupying mass lesion are noted. IMPRESSION: Soft tissue hematoma in the frontal region No acute intracranial abnormality is noted. Electronically Signed    By: Inez Catalina M.D.   On: 06/27/2015 23:58   Dg Humerus Right  06/27/2015  CLINICAL DATA:  [place] out of bed with arm pain, initial encounter EXAM: RIGHT HUMERUS - 2+ VIEW COMPARISON:  None. FINDINGS: Comminuted fracture of the mid to distal humerus is noted with some mild impaction and angulation at the fracture site. Degenerative changes of the right shoulder joint are noted. IMPRESSION: Mid to distal comminuted humeral fracture. Electronically Signed   By: Inez Catalina M.D.   On: 06/27/2015 23:40   I have personally reviewed and evaluated these images and lab results as part of my medical decision-making.   EKG Interpretation   Date/Time:  Tuesday June 27 2015 23:03:07 EST Ventricular Rate:  82 PR Interval:  167 QRS Duration: 86 QT Interval:  511 QTC Calculation: 597 R Axis:   61 Text Interpretation:  Sinus rhythm Ventricular bigeminy Abnormal R-wave  progression, early transition LVH with secondary repolarization  abnormality No previous ECGs available Confirmed by NGUYEN, EMILY ([id number])  on 06/27/2015 11:07:53 PM     Patient discussed with and seen by Dr. Sabra Heck.   Radiology results reviewed and shared with patient/family.  Consult with orthopedics ([place]):  Clean and irrigate wound, apply xerofoam with bulky ABD dressing, have ortho tech apply coaptation splint.  IV antibiotic initially, then transition to oral. MDM   Final diagnoses:  Arm pain, right  Type I comminuted fracture mid-distal right humerus. Ancef, 2 grams, IV ordered. Admit to medicine for completion of three doses of ancef.      Etta Quill, NP 06/28/15 0111  Noemi Chapel, MD 07/01/15 [phone]

## 2015-06-28 ENCOUNTER — Other Ambulatory Visit: Payer: Self-pay

## 2015-06-28 ENCOUNTER — Encounter (HOSPITAL_COMMUNITY): Payer: Self-pay | Admitting: Internal Medicine

## 2015-06-28 DIAGNOSIS — S42301B Unspecified fracture of shaft of humerus, right arm, initial encounter for open fracture: Secondary | ICD-10-CM | POA: Diagnosis present

## 2015-06-28 DIAGNOSIS — I493 Ventricular premature depolarization: Secondary | ICD-10-CM | POA: Diagnosis present

## 2015-06-28 DIAGNOSIS — S42351B Displaced comminuted fracture of shaft of humerus, right arm, initial encounter for open fracture: Secondary | ICD-10-CM

## 2015-06-28 DIAGNOSIS — I2510 Atherosclerotic heart disease of native coronary artery without angina pectoris: Secondary | ICD-10-CM | POA: Diagnosis present

## 2015-06-28 DIAGNOSIS — Z880 Allergy status to penicillin: Secondary | ICD-10-CM | POA: Diagnosis not present

## 2015-06-28 DIAGNOSIS — E785 Hyperlipidemia, unspecified: Secondary | ICD-10-CM

## 2015-06-28 DIAGNOSIS — Z888 Allergy status to other drugs, medicaments and biological substances status: Secondary | ICD-10-CM | POA: Diagnosis not present

## 2015-06-28 DIAGNOSIS — S42309A Unspecified fracture of shaft of humerus, unspecified arm, initial encounter for closed fracture: Secondary | ICD-10-CM | POA: Diagnosis present

## 2015-06-28 DIAGNOSIS — F419 Anxiety disorder, unspecified: Secondary | ICD-10-CM | POA: Diagnosis present

## 2015-06-28 DIAGNOSIS — I10 Essential (primary) hypertension: Secondary | ICD-10-CM

## 2015-06-28 DIAGNOSIS — Z950 Presence of cardiac pacemaker: Secondary | ICD-10-CM | POA: Diagnosis present

## 2015-06-28 DIAGNOSIS — S0093XA Contusion of unspecified part of head, initial encounter: Secondary | ICD-10-CM | POA: Diagnosis present

## 2015-06-28 DIAGNOSIS — Z853 Personal history of malignant neoplasm of breast: Secondary | ICD-10-CM | POA: Diagnosis not present

## 2015-06-28 DIAGNOSIS — M4800 Spinal stenosis, site unspecified: Secondary | ICD-10-CM | POA: Diagnosis present

## 2015-06-28 DIAGNOSIS — Z887 Allergy status to serum and vaccine status: Secondary | ICD-10-CM | POA: Diagnosis not present

## 2015-06-28 DIAGNOSIS — F329 Major depressive disorder, single episode, unspecified: Secondary | ICD-10-CM | POA: Diagnosis present

## 2015-06-28 DIAGNOSIS — Z885 Allergy status to narcotic agent status: Secondary | ICD-10-CM | POA: Diagnosis not present

## 2015-06-28 DIAGNOSIS — K219 Gastro-esophageal reflux disease without esophagitis: Secondary | ICD-10-CM | POA: Diagnosis present

## 2015-06-28 DIAGNOSIS — I2699 Other pulmonary embolism without acute cor pulmonale: Secondary | ICD-10-CM

## 2015-06-28 DIAGNOSIS — Z86711 Personal history of pulmonary embolism: Secondary | ICD-10-CM | POA: Diagnosis not present

## 2015-06-28 DIAGNOSIS — D72829 Elevated white blood cell count, unspecified: Secondary | ICD-10-CM | POA: Diagnosis present

## 2015-06-28 DIAGNOSIS — S42351A Displaced comminuted fracture of shaft of humerus, right arm, initial encounter for closed fracture: Secondary | ICD-10-CM | POA: Diagnosis not present

## 2015-06-28 DIAGNOSIS — Z86718 Personal history of other venous thrombosis and embolism: Secondary | ICD-10-CM | POA: Diagnosis not present

## 2015-06-28 DIAGNOSIS — S42353A Displaced comminuted fracture of shaft of humerus, unspecified arm, initial encounter for closed fracture: Secondary | ICD-10-CM | POA: Insufficient documentation

## 2015-06-28 DIAGNOSIS — Z886 Allergy status to analgesic agent status: Secondary | ICD-10-CM | POA: Diagnosis not present

## 2015-06-28 DIAGNOSIS — W06XXXA Fall from bed, initial encounter: Secondary | ICD-10-CM | POA: Diagnosis present

## 2015-06-28 DIAGNOSIS — M21372 Foot drop, left foot: Secondary | ICD-10-CM | POA: Diagnosis present

## 2015-06-28 DIAGNOSIS — O2230 Deep phlebothrombosis in pregnancy, unspecified trimester: Secondary | ICD-10-CM | POA: Diagnosis present

## 2015-06-28 LAB — CBC WITH DIFFERENTIAL/PLATELET
Basophils Absolute: 0 K/uL (ref 0.0–0.1)
Basophils Relative: 0 %
Eosinophils Absolute: 0.1 K/uL (ref 0.0–0.7)
Eosinophils Relative: 1 %
HCT: 38.4 % (ref 36.0–46.0)
Hemoglobin: 12.4 g/dL (ref 12.0–15.0)
Lymphocytes Relative: 7 %
Lymphs Abs: 0.8 K/uL (ref 0.7–4.0)
MCH: 31.3 pg (ref 26.0–34.0)
MCHC: 32.3 g/dL (ref 30.0–36.0)
MCV: 97 fL (ref 78.0–100.0)
Monocytes Absolute: 0.6 K/uL (ref 0.1–1.0)
Monocytes Relative: 5 %
Neutro Abs: 10.2 K/uL — ABNORMAL HIGH (ref 1.7–7.7)
Neutrophils Relative %: 87 %
Platelets: 183 K/uL (ref 150–400)
RBC: 3.96 MIL/uL (ref 3.87–5.11)
RDW: 13.1 % (ref 11.5–15.5)
WBC: 11.7 K/uL — ABNORMAL HIGH (ref 4.0–10.5)

## 2015-06-28 LAB — COMPREHENSIVE METABOLIC PANEL
ALBUMIN: 3.2 g/dL — AB (ref 3.5–5.0)
ALK PHOS: 77 U/L (ref 38–126)
ALT: 13 U/L — AB (ref 14–54)
ANION GAP: 8 (ref 5–15)
AST: 18 U/L (ref 15–41)
BUN: 14 mg/dL (ref 6–20)
CALCIUM: 8.6 mg/dL — AB (ref 8.9–10.3)
CHLORIDE: 107 mmol/L (ref 101–111)
CO2: 24 mmol/L (ref 22–32)
Creatinine, Ser: 0.9 mg/dL (ref 0.44–1.00)
GFR calc Af Amer: >60 mL/min (ref >60)
GFR calc non Af Amer: 52 mL/min — ABNORMAL LOW (ref >60)
GLUCOSE: 120 mg/dL — AB (ref 65–99)
Potassium: 3.6 mmol/L (ref 3.5–5.1)
SODIUM: 139 mmol/L (ref 135–145)
Total Bilirubin: 0.6 mg/dL (ref 0.3–1.2)
Total Protein: 5.2 g/dL — ABNORMAL LOW (ref 6.5–8.1)

## 2015-06-28 LAB — URINALYSIS, ROUTINE W REFLEX MICROSCOPIC
Bilirubin Urine: NEGATIVE
Glucose, UA: NEGATIVE mg/dL
Hgb urine dipstick: NEGATIVE
Ketones, ur: 15 mg/dL — AB
Nitrite: NEGATIVE
PROTEIN: NEGATIVE mg/dL
Specific Gravity, Urine: 1.019 (ref 1.005–1.030)
pH: 5 (ref 5.0–8.0)

## 2015-06-28 LAB — URINE MICROSCOPIC-ADD ON: RBC / HPF: NONE SEEN RBC/hpf (ref 0–5)

## 2015-06-28 LAB — CBC
HCT: 33.7 % — ABNORMAL LOW (ref 36.0–46.0)
HEMOGLOBIN: 11.1 g/dL — AB (ref 12.0–15.0)
MCH: 31.9 pg (ref 26.0–34.0)
MCHC: 32.9 g/dL (ref 30.0–36.0)
MCV: 96.8 fL (ref 78.0–100.0)
Platelets: 180 10*3/uL (ref 150–400)
RBC: 3.48 MIL/uL — ABNORMAL LOW (ref 3.87–5.11)
RDW: 13.3 % (ref 11.5–15.5)
WBC: 9.8 10*3/uL (ref 4.0–10.5)

## 2015-06-28 LAB — GLUCOSE, CAPILLARY: GLUCOSE-CAPILLARY: 121 mg/dL — AB (ref 65–99)

## 2015-06-28 LAB — TYPE AND SCREEN
ABO/RH(D): O POS
Antibody Screen: NEGATIVE

## 2015-06-28 LAB — BASIC METABOLIC PANEL WITH GFR
Anion gap: 13 (ref 5–15)
BUN: 14 mg/dL (ref 6–20)
CO2: 19 mmol/L — ABNORMAL LOW (ref 22–32)
Calcium: 8.7 mg/dL — ABNORMAL LOW (ref 8.9–10.3)
Chloride: 107 mmol/L (ref 101–111)
Creatinine, Ser: 0.93 mg/dL (ref 0.44–1.00)
GFR calc Af Amer: 58 mL/min — ABNORMAL LOW
GFR calc non Af Amer: 50 mL/min — ABNORMAL LOW
Glucose, Bld: 169 mg/dL — ABNORMAL HIGH (ref 65–99)
Potassium: 3.5 mmol/L (ref 3.5–5.1)
Sodium: 139 mmol/L (ref 135–145)

## 2015-06-28 LAB — SURGICAL PCR SCREEN
MRSA, PCR: NEGATIVE
Staphylococcus aureus: NEGATIVE

## 2015-06-28 LAB — PROTIME-INR
INR: 1.15 (ref 0.00–1.49)
Prothrombin Time: 14.9 seconds (ref 11.6–15.2)

## 2015-06-28 LAB — TROPONIN I
TROPONIN I: 0.03 ng/mL (ref <0.031)
Troponin I: 0.03 ng/mL (ref <0.031)
Troponin I: 0.04 ng/mL — ABNORMAL HIGH (ref <0.031)

## 2015-06-28 LAB — APTT: APTT: 33 s (ref 24–37)

## 2015-06-28 MED ORDER — ZOLPIDEM TARTRATE 5 MG PO TABS: 5.0000 mg | ORAL_TABLET | Freq: Every evening | ORAL | Status: DC | PRN

## 2015-06-28 MED ORDER — ACETAMINOPHEN 325 MG PO TABS
650.0000 mg | ORAL_TABLET | Freq: Three times a day (TID) | ORAL | Status: DC
  Filled 2015-06-28: qty 2

## 2015-06-28 MED ORDER — NITROGLYCERIN 0.4 MG SL SUBL: 0.4000 mg | SUBLINGUAL_TABLET | SUBLINGUAL | Status: DC | PRN

## 2015-06-28 MED ORDER — MELATONIN 3 MG PO TABS: 3.0000 mg | ORAL_TABLET | Freq: Every day | ORAL | Status: DC

## 2015-06-28 MED ORDER — HYDROXYZINE HCL 25 MG PO TABS: 25.0000 mg | ORAL_TABLET | Freq: Three times a day (TID) | ORAL | Status: AC | PRN

## 2015-06-28 MED ORDER — FLUTICASONE PROPIONATE 50 MCG/ACT NA SUSP
2.0000 | Freq: Every day | NASAL | Status: DC
  Filled 2015-06-28: qty 16

## 2015-06-28 MED ORDER — SODIUM CHLORIDE 0.9 % IV BOLUS (SEPSIS): 500.0000 mL | Freq: Once | INTRAVENOUS | Status: DC

## 2015-06-28 MED ORDER — HYDROXYZINE HCL 25 MG PO TABS
25.0000 mg | ORAL_TABLET | Freq: Two times a day (BID) | ORAL | Status: DC
  Administered 2015-06-28: 25 mg via ORAL
  Filled 2015-06-28: qty 1

## 2015-06-28 MED ORDER — OXYCODONE-ACETAMINOPHEN 5-325 MG PO TABS: 1.0000 | ORAL_TABLET | ORAL | Status: DC | PRN

## 2015-06-28 MED ORDER — LORAZEPAM 0.5 MG PO TABS: 0.5000 mg | ORAL_TABLET | Freq: Four times a day (QID) | ORAL | Status: DC | PRN

## 2015-06-28 MED ORDER — SODIUM CHLORIDE 0.9 % IV SOLN
INTRAVENOUS | Status: DC
  Administered 2015-06-28: 03:00:00 via INTRAVENOUS

## 2015-06-28 MED ORDER — DOXYCYCLINE HYCLATE 100 MG PO TABS: 100.0000 mg | ORAL_TABLET | Freq: Two times a day (BID) | ORAL | Status: DC

## 2015-06-28 MED ORDER — LORAZEPAM 1 MG PO TABS: 0.5000 mg | ORAL_TABLET | Freq: Four times a day (QID) | ORAL | Status: DC | PRN

## 2015-06-28 MED ORDER — LOPERAMIDE HCL 2 MG PO CAPS: 4.0000 mg | ORAL_CAPSULE | Freq: Every day | ORAL | Status: DC | PRN

## 2015-06-28 MED ORDER — LORAZEPAM 1 MG PO TABS
1.0000 mg | ORAL_TABLET | Freq: Once | ORAL | Status: AC
  Administered 2015-06-28: 1 mg via ORAL
  Filled 2015-06-28: qty 1

## 2015-06-28 MED ORDER — OXYCODONE-ACETAMINOPHEN 5-325 MG PO TABS
1.0000 | ORAL_TABLET | ORAL | Status: DC | PRN
  Administered 2015-06-28 (×2): 1 via ORAL
  Filled 2015-06-28 (×2): qty 1

## 2015-06-28 MED ORDER — SERTRALINE HCL 50 MG PO TABS
150.0000 mg | ORAL_TABLET | ORAL | Status: DC
  Filled 2015-06-28: qty 1

## 2015-06-28 MED ORDER — TEMAZEPAM 15 MG PO CAPS: 15.0000 mg | ORAL_CAPSULE | Freq: Every day | ORAL | Status: DC

## 2015-06-28 MED ORDER — BUSPIRONE HCL 15 MG PO TABS
30.0000 mg | ORAL_TABLET | Freq: Two times a day (BID) | ORAL | Status: DC
  Administered 2015-06-28: 30 mg via ORAL
  Filled 2015-06-28: qty 2

## 2015-06-28 MED ORDER — SODIUM CHLORIDE 0.9 % IJ SOLN: 3.0000 mL | Freq: Two times a day (BID) | INTRAMUSCULAR | Status: DC

## 2015-06-28 MED ORDER — CLINDAMYCIN PHOSPHATE 900 MG/50ML IV SOLN
900.0000 mg | Freq: Once | INTRAVENOUS | Status: AC
  Administered 2015-06-28: 900 mg via INTRAVENOUS
  Filled 2015-06-28: qty 50

## 2015-06-28 MED ORDER — DOXAZOSIN MESYLATE 8 MG PO TABS
8.0000 mg | ORAL_TABLET | Freq: Every day | ORAL | Status: DC
  Administered 2015-06-28: 8 mg via ORAL
  Filled 2015-06-28: qty 1

## 2015-06-28 MED ORDER — LORAZEPAM 1 MG PO TABS
1.0000 mg | ORAL_TABLET | Freq: Two times a day (BID) | ORAL | Status: DC
  Administered 2015-06-28: 1 mg via ORAL
  Filled 2015-06-28: qty 1

## 2015-06-28 MED ORDER — APIXABAN 2.5 MG PO TABS
2.5000 mg | ORAL_TABLET | Freq: Two times a day (BID) | ORAL | Status: DC
  Administered 2015-06-28 (×2): 2.5 mg via ORAL
  Filled 2015-06-28 (×2): qty 1

## 2015-06-28 MED ORDER — CEFAZOLIN SODIUM-DEXTROSE 2-3 GM-% IV SOLR
2.0000 g | Freq: Once | INTRAVENOUS | Status: DC
  Filled 2015-06-28: qty 50

## 2015-06-28 MED ORDER — LORATADINE 10 MG PO TABS: 10.0000 mg | ORAL_TABLET | Freq: Every day | ORAL | Status: DC | PRN

## 2015-06-28 MED ORDER — LISINOPRIL 40 MG PO TABS
40.0000 mg | ORAL_TABLET | Freq: Every day | ORAL | Status: DC
  Administered 2015-06-28: 40 mg via ORAL
  Filled 2015-06-28: qty 1

## 2015-06-28 MED ORDER — KETOTIFEN FUMARATE 0.025 % OP SOLN: 1.0000 [drp] | Freq: Every day | OPHTHALMIC | Status: DC | PRN

## 2015-06-28 MED ORDER — MORPHINE SULFATE (PF) 2 MG/ML IV SOLN
1.0000 mg | INTRAVENOUS | Status: DC | PRN
  Administered 2015-06-28: 1 mg via INTRAVENOUS
  Filled 2015-06-28: qty 1

## 2015-06-28 MED ORDER — DILTIAZEM HCL ER 180 MG PO CP24
180.0000 mg | ORAL_CAPSULE | Freq: Every day | ORAL | Status: DC
  Administered 2015-06-28: 180 mg via ORAL
  Filled 2015-06-28 (×2): qty 1

## 2015-06-28 NOTE — Progress Notes (Signed)
Report called to [place] at [facility].

## 2015-06-28 NOTE — Clinical Social Work Note (Signed)
Clinical Social Work Assessment  Patient Details  Name: Cathy Wallace MRN: [id number] Date of Birth: 04/04/1917  Date of referral:  06/28/15               Reason for consult:  Facility Placement, Discharge Planning                Permission sought to share information with:  Family Supports, Customer service manager Permission granted to share information::  Yes, Verbal Permission Granted  Name::     Fritz Pickerel  Agency::  [facility]  Relationship::  Adult son  Contact Information:  [phone]  Housing/Transportation Living arrangements for the past 2 months:  [place] of Information:  Facility, Adult Children Patient Interpreter Needed:  None Criminal Activity/Legal Involvement Pertinent to Current Situation/Hospitalization:  No - Comment as needed Significant Relationships:  Adult Children Lives with:  Facility Resident Do you feel safe going back to the place where you live?  Yes Need for family participation in patient care:  Yes (Comment) (Patient's son, Fritz Pickerel, active in patient's care.)  Care giving concerns:  Patient's son expressed no concerns at this time.   Social Worker assessment / plan:  CSW received referral stating patient admitted from facility. Per chart review, patient admitted from [facility]. CSW contacted [facility] who stated patient was a resident and able to return once medically stable. [facility] also provided CSW with next of kin contact information. CSW contacted patient's son, Fritz Pickerel, who confirmed patient will return to [facility] once medically stable. [facility] to provide wheelchair [place] transportation at [employer] on 06/28/2015.  Employment status:  Retired Forensic scientist:  Medicare PT Recommendations:  Not assessed at this time [place] / Referral to community resources:  [place]  Patient/Family's Response to care:  Patient's family understanding and agreeable to CSW plan of  care.  Patient/Family's Understanding of and Emotional Response to Diagnosis, Current Treatment, and Prognosis:  Patient's family understanding and agreeable to CSW plan of care.  Emotional Assessment Appearance:  Other (Comment Required (CSW spoke with patient's son, Fritz Pickerel.) Attitude/Demeanor/Rapport:  Other (CSW spoke with patient's son, Fritz Pickerel.) Affect (typically observed):  Other (CSW spoke with patient's son, Fritz Pickerel.) Orientation:  Oriented to Self, Oriented to Place, Oriented to Situation Alcohol / Substance use:  Not Applicable Psych involvement (Current and /or in the community):  No (Comment) (Not appropriate at this time.)  Discharge Needs  Concerns to be addressed:  No discharge needs identified Readmission within the last 30 days:  No Current discharge risk:  None Barriers to Discharge:  No Barriers Identified   Caroline Sauger, LCSW 06/28/2015, 12:51 PM [phone]

## 2015-06-28 NOTE — Discharge Summary (Signed)
Physician Discharge Summary  Cathy Wallace [id number] DOB: 06/27/1917 DOA: 06/27/2015  PCP: No primary care provider on file.  Admit date: 06/27/2015 Discharge date: 06/28/2015  Time spent: *25 minutes  Recommendations for Outpatient Follow-up:  1. Follow up PCP in 2 weeks 2. Follow up orthopedics in 4 weeks   Discharge Diagnoses:  Principal Problem:   Comminuted fracture of right humerus Active Problems:   PVC (premature ventricular contraction)   CAD (coronary artery disease)   HTN (hypertension)   HLD (hyperlipidemia)   PE (pulmonary embolism)   DVT (deep vein thrombosis) in pregnancy   Left foot drop   Spinal stenosis   Pacemaker   Humerus fracture   Discharge Condition: Stable  Diet recommendation: Low salt diet  Filed Weights   06/28/15 0304  Weight: 46.584 kg (102 lb 11.2 oz)    History of present illness:  81 y.o. female with PMH of hypertension, hyperlipidemia, GERD, depression, anxiety, CAD, S/p of stent placement, left foot drop, DVT, PE on Eliquis, spinal stenosis, pacemaker placement, breast cancer, who presents with right arm pain after fall. Pt is from SNF. Per report, she fell out of bed and hit her head and injured her R upper am on a metal trash can denting it.She developed an obvious open fracture to the right upper arm and has a hematoma over right frontal head. Patient does not have abdominal pain, diarrhea, symptoms of UTI. She has chronic left foot drop, but no new unilateral weakness, vision change or hearing loss.  Hospital Course:   Comminuted open fracture of right humerus- patient was seen by orthopedic surgery, recommended to continue patient in sling and nonoperative approach. Patient will be in the sling for a few days and then placed in a functional fracture brace. Will follow up with orthopedic surgery in 2 weeks.  Right upper arm wound- patient has laceration of the right upper arm, will send on doxycycline 100 mg twice a day for  2 weeks.  Leukocytosis- resolved, today white count is 9.8. She has been afebrile.  PVCs with bigeminy- patient had PVCs and bigeminy, pacemaker in place. Cardiac enzymes negative. No chest pain.  CAD- stable, no chest pain.  Hypertension- blood pressure stable continue lisinopril, doxazosin, diltiazem.  Polypharmacy- patient on multiple sedative medications including hydroxyzine 25 mg twice a day, Ativan 1 mg twice a day, temazepam 15 mg at bedtime. Will change hydroxyzine 25 mg every 8 hours when necessary, discontinue scheduled Ativan 1 mg twice a day. Will continue with Ativan 0.5 mg every 8 hours when necessary for anxiety. Will continue with temazepam 15 mg daily at bedtime.  History of pulmonary embolism and DVT- patient currently on Eliquis, has right frontal hematoma but no intracranial bleeding. Will continue with Eliquis at this time.   Procedures:  None  Consultations:  Orthopedics  Discharge Exam: Filed Vitals:   06/28/15 0304 06/28/15 0626  BP: 143/60 137/92  Pulse: 68 65  Temp: 98.4 F (36.9 C) 98.2 F (36.8 C)  Resp: 16 15    General: Appears in no acute distress Cardiovascular: S1-S2 regular Respiratory: Clear to auscultation bilaterally  Discharge Instructions   Discharge Instructions    Diet - low sodium heart healthy    Complete by:  As directed      Increase activity slowly    Complete by:  As directed           Current Discharge Medication List    START taking these medications   Details  doxycycline (VIBRA-TABS)  100 MG tablet Take 1 tablet (100 mg total) by mouth 2 (two) times daily. Qty: 28 tablet, Refills: 0    oxyCODONE-acetaminophen (PERCOCET/ROXICET) 5-325 MG tablet Take 1 tablet by mouth every 4 (four) hours as needed for moderate pain. Qty: 30 tablet, Refills: 0      CONTINUE these medications which have CHANGED   Details  hydrOXYzine (ATARAX/VISTARIL) 25 MG tablet Take 1 tablet (25 mg total) by mouth every 8 (eight) hours as  needed. Qty: 30 tablet, Refills: 0    LORazepam (ATIVAN) 1 MG tablet Take 0.5 tablets (0.5 mg total) by mouth every 6 (six) hours as needed for anxiety. Qty: 10 tablet, Refills: 0    temazepam (RESTORIL) 15 MG capsule Take 1 capsule (15 mg total) by mouth at bedtime. Qty: 30 capsule, Refills: 0      CONTINUE these medications which have NOT CHANGED   Details  acetaminophen (TYLENOL) 325 MG tablet Take 650 mg by mouth 3 (three) times daily.    apixaban (ELIQUIS) 2.5 MG TABS tablet Take 2.5 mg by mouth 2 (two) times daily.    busPIRone (BUSPAR) 30 MG tablet Take 30 mg by mouth 2 (two) times daily.    diltiazem (DILACOR XR) 180 MG 24 hr capsule Take 180 mg by mouth daily.    doxazosin (CARDURA) 8 MG tablet Take 8 mg by mouth every morning.    fluticasone (FLONASE) 50 MCG/ACT nasal spray Place 2 sprays into both nostrils daily.    ketotifen (ALLERGY EYE DROPS) 0.025 % ophthalmic solution Place 1 drop into both eyes daily as needed (allergies).    lisinopril (PRINIVIL,ZESTRIL) 40 MG tablet Take 40 mg by mouth every morning.    loperamide (IMODIUM) 2 MG capsule Take 4 mg by mouth daily as needed for diarrhea or loose stools.    loratadine (CLARITIN) 10 MG tablet Take 10 mg by mouth daily as needed for allergies.    Melatonin 3 MG TABS Take 3 mg by mouth at bedtime.    mirabegron ER (MYRBETRIQ) 25 MG TB24 tablet Take 25 mg by mouth every morning.    nitroGLYCERIN (NITROSTAT) 0.4 MG SL tablet Place 0.4 mg under the tongue every 5 (five) minutes as needed for chest pain.    potassium chloride (K-DUR,KLOR-CON) 10 MEQ tablet Take 10 mEq by mouth every morning.    sertraline (ZOLOFT) 100 MG tablet Take 150 mg by mouth every morning.       Allergies  Allergen Reactions  . Aspirin Other (See Comments)    Pt taking xarelto   . Benadryl [Diphenhydramine] Other (See Comments)    unknown  . Codeine Other (See Comments)    unknown  . Pneumococcal Vaccines Other (See Comments)     unknown  . Penicillins Rash      The results of significant diagnostics from this hospitalization (including imaging, microbiology, ancillary and laboratory) are listed below for reference.    Significant Diagnostic Studies: Ct Head Wo Contrast  06/27/2015  CLINICAL DATA:  Recent fall with frontal hematoma, initial encounter EXAM: CT HEAD WITHOUT CONTRAST TECHNIQUE: Contiguous axial images were obtained from the base of the skull through the vertex without intravenous contrast. COMPARISON:  None. FINDINGS: Bony calvarium is intact. A soft tissue hematoma is noted in the frontal region just to the right of the midline. Diffuse atrophic changes and chronic white matter ischemic change is noted. No findings to suggest acute hemorrhage, acute infarction or space-occupying mass lesion are noted. IMPRESSION: Soft tissue hematoma in the frontal  region No acute intracranial abnormality is noted. Electronically Signed   By: Inez Catalina M.D.   On: 06/27/2015 23:58   Dg Humerus Right  06/27/2015  CLINICAL DATA:  [address] out of bed with arm pain, initial encounter EXAM: RIGHT HUMERUS - 2+ VIEW COMPARISON:  None. FINDINGS: Comminuted fracture of the mid to distal humerus is noted with some mild impaction and angulation at the fracture site. Degenerative changes of the right shoulder joint are noted. IMPRESSION: Mid to distal comminuted humeral fracture. Electronically Signed   By: Inez Catalina M.D.   On: 06/27/2015 23:40    Microbiology: Recent Results (from the past 240 hour(s))  Surgical pcr screen     Status: None   Collection Time: 06/28/15  3:21 AM  Result Value Ref Range Status   MRSA, PCR NEGATIVE NEGATIVE Final   Staphylococcus aureus NEGATIVE NEGATIVE Final    Comment:        The Xpert SA Assay (FDA approved for NASAL specimens in patients over 98 years of age), is one component of a comprehensive surveillance program.  Test performance has been validated by [facility] for patients  greater than or equal to 33 year old. It is not intended to diagnose infection nor to guide or monitor treatment.      Labs: Basic Metabolic Panel:  Recent Labs Lab 06/27/15 2354 06/28/15 0600  NA 139 139  K 3.5 3.6  CL 107 107  CO2 19* 24  GLUCOSE 169* 120*  BUN 14 14  CREATININE 0.93 0.90  CALCIUM 8.7* 8.6*   Liver Function Tests:  Recent Labs Lab 06/28/15 0600  AST 18  ALT 13*  ALKPHOS 77  BILITOT 0.6  PROT 5.2*  ALBUMIN 3.2*   No results for input(s): LIPASE, AMYLASE in the last 168 hours. No results for input(s): AMMONIA in the last 168 hours. CBC:  Recent Labs Lab 06/27/15 2354 06/28/15 0600  WBC 11.7* 9.8  NEUTROABS 10.2*  --   HGB 12.4 11.1*  HCT 38.4 33.7*  MCV 97.0 96.8  PLT 183 180   Cardiac Enzymes:  Recent Labs Lab 06/28/15 0205 06/28/15 0600  TROPONINI 0.03 0.03   BNP:  CBG:  Recent Labs Lab 06/27/15 2328 06/28/15 0805  GLUCAP 168* 121*       Signed:  Oswald Hillock MD   [facility] 06/28/2015, 12:29 PM

## 2015-06-28 NOTE — ED Notes (Signed)
Paged ortho 

## 2015-06-28 NOTE — ED Notes (Signed)
Ortho completed at bedside

## 2015-06-28 NOTE — NC FL2 (Signed)
[place] MEDICAID FL2 LEVEL OF CARE SCREENING TOOL     IDENTIFICATION  Patient Name: Cathy Wallace Birthdate: [birth year] Sex: female Admission Date (Current Location): 06/27/2015  [facility] and [place] Number:  Herbalist and Address:  [facility], [place] [address], [place], [place] [address]      Provider Number: [id number]  Attending Physician Name and Address:  Oswald Hillock, MD  Relative Name and Phone Number:       Current Level of Care: Hospital Recommended Level of Care: [place] Prior Approval Number:    Date Approved/Denied:   PASRR Number:    Discharge Plan: SNF    Current Diagnoses: Patient Active Problem List   Diagnosis Date Noted  . PVC (premature ventricular contraction) 06/28/2015  . Comminuted fracture of right humerus 06/28/2015  . Humerus fracture 06/28/2015  . CAD (coronary artery disease)   . HTN (hypertension)   . HLD (hyperlipidemia)   . PE (pulmonary embolism)   . DVT (deep vein thrombosis) in pregnancy   . Left foot drop   . Spinal stenosis   . Pacemaker   . Comminuted fracture of humerus     Orientation RESPIRATION BLADDER Height & Weight    Self, Situation, Place  Normal Continent 5\' 4"  (162.6 cm) 102 lbs.  BEHAVIORAL SYMPTOMS/MOOD NEUROLOGICAL BOWEL NUTRITION STATUS   (n/a)  (n/a) Continent Diet (Please see discharge summary.)  AMBULATORY STATUS COMMUNICATION OF NEEDS Skin    (At previous function prior to admission.) Verbally Skin abrasions                       Personal Care Assistance Level of Assistance  Bathing, Feeding, Dressing Bathing Assistance: Maximum assistance Feeding assistance: Independent Dressing Assistance: Maximum assistance     Functional Limitations Info   (n/a)          [place]  PT (By licensed PT), OT (By licensed OT)     PT Frequency: 5 OT Frequency: 5            Contractures      Additional Factors Info   Code Status, Allergies Code Status Info: DNR Allergies Info: Aspirin, Benadryl Diphenhydramine, Codeine, Pneumococcal Vaccines, Penicillins           Current Medications (06/28/2015):  This is the current hospital active medication list Current Facility-Administered Medications  Medication Dose Route Frequency Provider Last Rate Last Dose  . 0.9 %  sodium chloride infusion   Intravenous Continuous Ivor Costa, MD 75 mL/hr at 06/28/15 0257    . acetaminophen (TYLENOL) tablet 650 mg  650 mg Oral TID Ivor Costa, MD   650 mg at 06/28/15 1243  . apixaban (ELIQUIS) tablet 2.5 mg  2.5 mg Oral BID Ivor Costa, MD   2.5 mg at 06/28/15 1244  . busPIRone (BUSPAR) tablet 30 mg  30 mg Oral BID Ivor Costa, MD   30 mg at 06/28/15 1137  . ceFAZolin (ANCEF) IVPB 2 g/50 mL premix  2 g Intravenous Once Etta Quill, NP   2 g at 06/28/15 0132  . diltiazem (DILACOR XR) 24 hr capsule 180 mg  180 mg Oral Daily Ivor Costa, MD   180 mg at 06/28/15 1136  . doxazosin (CARDURA) tablet 8 mg  8 mg Oral Daily Ivor Costa, MD   8 mg at 06/28/15 1245  . fluticasone (FLONASE) 50 MCG/ACT nasal spray 2 spray  2 spray Each Nare Daily Ivor Costa, MD  2 spray at 06/28/15 1245  . hydrOXYzine (ATARAX/VISTARIL) tablet 25 mg  25 mg Oral BID Ivor Costa, MD   25 mg at 06/28/15 1137  . ketotifen (ZADITOR) 0.025 % ophthalmic solution 1 drop  1 drop Both Eyes Daily PRN Ivor Costa, MD      . lisinopril (PRINIVIL,ZESTRIL) tablet 40 mg  40 mg Oral Daily Ivor Costa, MD   40 mg at 06/28/15 1136  . loperamide (IMODIUM) capsule 4 mg  4 mg Oral Daily PRN Ivor Costa, MD      . loratadine (CLARITIN) tablet 10 mg  10 mg Oral Daily PRN Ivor Costa, MD      . LORazepam (ATIVAN) tablet 0.5 mg  0.5 mg Oral Q6H PRN Ivor Costa, MD      . LORazepam (ATIVAN) tablet 1 mg  1 mg Oral BID Ivor Costa, MD   1 mg at 06/28/15 1137  . morphine 2 MG/ML injection 1 mg  1 mg Intravenous Q3H PRN Ivor Costa, MD   1 mg at 06/28/15 0920  . nitroGLYCERIN (NITROSTAT) SL tablet 0.4 mg  0.4  mg Sublingual Q5 min PRN Ivor Costa, MD      . oxyCODONE-acetaminophen (PERCOCET/ROXICET) 5-325 MG per tablet 1 tablet  1 tablet Oral Q4H PRN Ivor Costa, MD   1 tablet at 06/28/15 1137  . sertraline (ZOLOFT) tablet 150 mg  150 mg Oral Gaye Alken, MD   150 mg at 06/28/15 0608  . sodium chloride 0.9 % bolus 500 mL  500 mL Intravenous Once Ivor Costa, MD      . sodium chloride 0.9 % injection 3 mL  3 mL Intravenous Q12H Ivor Costa, MD      . temazepam (RESTORIL) capsule 15 mg  15 mg Oral QHS Ivor Costa, MD         Discharge Medications: Please see discharge summary for a list of discharge medications.  Relevant Imaging Results:  Relevant Lab Results:   Additional Information    Luna Kitchens [phone]

## 2015-06-28 NOTE — Progress Notes (Signed)
Orthopedic Tech Progress Note Patient Details:  Cathy Wallace Apr 20, 1917 [id number] Applied fiberglass coaptation splint to RUE.  Pulses, sensation, motion intact before and after splinting.  Capillary refill less than 2 seconds before and after splinting.  Placed splinted RUE in arm sling. Ortho Devices Type of Ortho Device: Coapt Ortho Device/Splint Location: RUE Ortho Device/Splint Interventions: Application   Darrol Poke 06/28/2015, 1:23 AM

## 2015-06-28 NOTE — Consult Note (Signed)
Reason for Consult:  Right humerus shaft fracture Referring Physician: EDP Sabra Heck, MD  Cathy Wallace is an 81 y.o. female.  HPI:   81 yo female nursing home patient who was admitted after sustaining a mechanical fall out of bed.  She was seen in the ED and found to have a right humerus fracture with a small punctate opening.  This was cleaned appropriately at the bedside and clean dressing as well as a splint was applied as appropriate treatment for this type of fracture.  She is awake and alert this am and does report right arm pain.  Past Medical History  Diagnosis Date  . Heart attack ([place]) "two years ago"  . CAD (coronary artery disease)   . HTN (hypertension)   . HLD (hyperlipidemia)   . PE (pulmonary embolism)   . DVT (deep vein thrombosis) in pregnancy   . Left foot drop   . Spinal stenosis   . Pacemaker   . Breast cancer [facility])     Past Surgical History  Procedure Laterality Date  . Hip fracture surgery      Family History  Problem Relation Age of Onset  . Heart attack Mother     Social History:  has no tobacco, alcohol, and drug history on file.  Allergies:  Allergies  Allergen Reactions  . Aspirin Other (See Comments)    Pt taking xarelto   . Benadryl [Diphenhydramine] Other (See Comments)    unknown  . Codeine Other (See Comments)    unknown  . Pneumococcal Vaccines Other (See Comments)    unknown  . Penicillins Rash    Medications: I have reviewed the patient's current medications.  Results for orders placed or performed during the hospital encounter of 06/27/15 (from the past 48 hour(s))  CBG monitoring, ED     Status: Abnormal   Collection Time: 06/27/15 11:28 PM  Result Value Ref Range   Glucose-Capillary 168 (H) 65 - 99 mg/dL  CBC with Differential/Platelet     Status: Abnormal   Collection Time: 06/27/15 11:54 PM  Result Value Ref Range   WBC 11.7 (H) 4.0 - 10.5 K/uL   RBC 3.96 3.87 - 5.11 MIL/uL   Hemoglobin 12.4 12.0 - 15.0 g/dL   HCT  38.4 36.0 - 46.0 %   MCV 97.0 78.0 - 100.0 fL   MCH 31.3 26.0 - 34.0 pg   MCHC 32.3 30.0 - 36.0 g/dL   RDW 13.1 11.5 - 15.5 %   Platelets 183 150 - 400 K/uL   Neutrophils Relative % 87 %   Neutro Abs 10.2 (H) 1.7 - 7.7 K/uL   Lymphocytes Relative 7 %   Lymphs Abs 0.8 0.7 - 4.0 K/uL   Monocytes Relative 5 %   Monocytes Absolute 0.6 0.1 - 1.0 K/uL   Eosinophils Relative 1 %   Eosinophils Absolute 0.1 0.0 - 0.7 K/uL   Basophils Relative 0 %   Basophils Absolute 0.0 0.0 - 0.1 K/uL  Basic metabolic panel     Status: Abnormal   Collection Time: 06/27/15 11:54 PM  Result Value Ref Range   Sodium 139 135 - 145 mmol/L   Potassium 3.5 3.5 - 5.1 mmol/L   Chloride 107 101 - 111 mmol/L   CO2 19 (L) 22 - 32 mmol/L   Glucose, Bld 169 (H) 65 - 99 mg/dL   BUN 14 6 - 20 mg/dL   Creatinine, Ser 0.93 0.44 - 1.00 mg/dL   Calcium 8.7 (L) 8.9 - 10.3 mg/dL  GFR calc non Af Amer 50 (L) >60 mL/min   GFR calc Af Amer 58 (L) >60 mL/min    Comment: (NOTE) The eGFR has been calculated using the CKD EPI equation. This calculation has not been validated in all clinical situations. eGFR's persistently <60 mL/min signify possible Chronic Kidney Disease.    Anion gap 13 5 - 15  Type and screen [place]     Status: None   Collection Time: 06/28/15  1:54 AM  Result Value Ref Range   ABO/RH(D) O POS    Antibody Screen NEG    Sample Expiration 07/01/2015   ABO/Rh     Status: None (Preliminary result)   Collection Time: 06/28/15  1:54 AM  Result Value Ref Range   ABO/RH(D) O POS   Protime-INR     Status: None   Collection Time: 06/28/15  2:05 AM  Result Value Ref Range   Prothrombin Time 14.9 11.6 - 15.2 seconds   INR 1.15 0.00 - 1.49  APTT     Status: None   Collection Time: 06/28/15  2:05 AM  Result Value Ref Range   aPTT 33 24 - 37 seconds  Troponin I (q 6hr x 3)     Status: None   Collection Time: 06/28/15  2:05 AM  Result Value Ref Range   Troponin I 0.03 <0.031 ng/mL     Comment:        NO INDICATION OF MYOCARDIAL INJURY.   Urinalysis, Routine w reflex microscopic (not at [facility])     Status: Abnormal   Collection Time: 06/28/15  2:30 AM  Result Value Ref Range   Color, Urine YELLOW YELLOW   APPearance CLEAR CLEAR   Specific Gravity, Urine 1.019 1.005 - 1.030   pH 5.0 5.0 - 8.0   Glucose, UA NEGATIVE NEGATIVE mg/dL   Hgb urine dipstick NEGATIVE NEGATIVE   Bilirubin Urine NEGATIVE NEGATIVE   Ketones, ur 15 (A) NEGATIVE mg/dL   Protein, ur NEGATIVE NEGATIVE mg/dL   Nitrite NEGATIVE NEGATIVE   Leukocytes, UA SMALL (A) NEGATIVE  Urine microscopic-add on     Status: Abnormal   Collection Time: 06/28/15  2:30 AM  Result Value Ref Range   Squamous Epithelial / LPF 0-5 (A) NONE SEEN   WBC, UA 0-5 0 - 5 WBC/hpf   RBC / HPF NONE SEEN 0 - 5 RBC/hpf   Bacteria, UA RARE (A) NONE SEEN   Urine-Other MICROSCOPIC EXAM PERFORMED ON UNCONCENTRATED URINE   Surgical pcr screen     Status: None   Collection Time: 06/28/15  3:21 AM  Result Value Ref Range   MRSA, PCR NEGATIVE NEGATIVE   Staphylococcus aureus NEGATIVE NEGATIVE    Comment:        The Xpert SA Assay (FDA approved for NASAL specimens in patients over 71 years of age), is one component of a comprehensive surveillance program.  Test performance has been validated by [facility] for patients greater than or equal to 36 year old. It is not intended to diagnose infection nor to guide or monitor treatment.   Comprehensive metabolic panel     Status: Abnormal   Collection Time: 06/28/15  6:00 AM  Result Value Ref Range   Sodium 139 135 - 145 mmol/L   Potassium 3.6 3.5 - 5.1 mmol/L   Chloride 107 101 - 111 mmol/L   CO2 24 22 - 32 mmol/L   Glucose, Bld 120 (H) 65 - 99 mg/dL   BUN 14 6 -  20 mg/dL   Creatinine, Ser 0.90 0.44 - 1.00 mg/dL   Calcium 8.6 (L) 8.9 - 10.3 mg/dL   Total Protein 5.2 (L) 6.5 - 8.1 g/dL   Albumin 3.2 (L) 3.5 - 5.0 g/dL   AST 18 15 - 41 U/L   ALT 13 (L) 14 - 54 U/L   Alkaline  Phosphatase 77 38 - 126 U/L   Total Bilirubin 0.6 0.3 - 1.2 mg/dL   GFR calc non Af Amer 52 (L) >60 mL/min   GFR calc Af Amer >60 >60 mL/min    Comment: (NOTE) The eGFR has been calculated using the CKD EPI equation. This calculation has not been validated in all clinical situations. eGFR's persistently <60 mL/min signify possible Chronic Kidney Disease.    Anion gap 8 5 - 15  CBC     Status: Abnormal   Collection Time: 06/28/15  6:00 AM  Result Value Ref Range   WBC 9.8 4.0 - 10.5 K/uL   RBC 3.48 (L) 3.87 - 5.11 MIL/uL   Hemoglobin 11.1 (L) 12.0 - 15.0 g/dL   HCT 33.7 (L) 36.0 - 46.0 %   MCV 96.8 78.0 - 100.0 fL   MCH 31.9 26.0 - 34.0 pg   MCHC 32.9 30.0 - 36.0 g/dL   RDW 13.3 11.5 - 15.5 %   Platelets 180 150 - 400 K/uL    Ct Head Wo Contrast  06/27/2015  CLINICAL DATA:  Recent fall with frontal hematoma, initial encounter EXAM: CT HEAD WITHOUT CONTRAST TECHNIQUE: Contiguous axial images were obtained from the base of the skull through the vertex without intravenous contrast. COMPARISON:  None. FINDINGS: Bony calvarium is intact. A soft tissue hematoma is noted in the frontal region just to the right of the midline. Diffuse atrophic changes and chronic white matter ischemic change is noted. No findings to suggest acute hemorrhage, acute infarction or space-occupying mass lesion are noted. IMPRESSION: Soft tissue hematoma in the frontal region No acute intracranial abnormality is noted. Electronically Signed   By: Inez Catalina M.D.   On: 06/27/2015 23:58   Dg Humerus Right  06/27/2015  CLINICAL DATA:  [address] out of bed with arm pain, initial encounter EXAM: RIGHT HUMERUS - 2+ VIEW COMPARISON:  None. FINDINGS: Comminuted fracture of the mid to distal humerus is noted with some mild impaction and angulation at the fracture site. Degenerative changes of the right shoulder joint are noted. IMPRESSION: Mid to distal comminuted humeral fracture. Electronically Signed   By: Inez Catalina M.D.    On: 06/27/2015 23:40    ROS Blood pressure 137/92, pulse 65, temperature 98.2 F (36.8 C), temperature source Oral, resp. rate 15, height _0  (1.626 m), weight 46.584 kg (102 lb 11.2 oz), SpO2 94 %. Physical Exam  Constitutional: She appears well-developed.  Neck: Normal range of motion. Neck supple.  Cardiovascular: Normal rate.   Respiratory: Effort normal.  GI: Soft.  Musculoskeletal:       Right upper arm: She exhibits bony tenderness, swelling, deformity and laceration.  Neurological: She is alert.   Her right arm splint is fitting appropriately and her hand is well-perfused   Assessment/Plan: Right humeral shaft fracture 1)  These fractures usually do very well with no-operative treatment.  She will be in her current splint for a few days and then placed in a functional fracture brace.  The right arm wound was minimal and she was given IV antibiotics.  She will need to stay in a sling as well and discharged  on oral antibiotics for 2 weeks (Keflex or Doxycycline).  Eldean Nanna Y 06/28/2015, 7:27 AM

## 2015-06-28 NOTE — Progress Notes (Signed)
PT Cancellation Note  Patient Details Name: Lylliana Nunez MRN: [id number] DOB: May 13, 1917   Cancelled Treatment:    Reason Eval/Treat Not Completed: Other (comment).  PT is due to d/c back to [facility] for rehab this PM at 1600.  Family in room and pt has already worked with OT today.  Family would like to defer PT to SNF tomorrow instead of proceeding with eval today.  If pt is still here tomorrow PT will evaluate then.    Thanks,    Barbarann Ehlers. Panayiotis [place], PT, DPT [phone]   06/28/2015, 2:35 PM

## 2015-06-28 NOTE — Discharge Planning (Signed)
Patient to return to [facility]. Patient's son, Fritz Pickerel, updated regarding discharge.  Facility: [facility] RN report number: [phone] Transportation: [facility] to transport patient at [facility], [place] Orthopedics: [phone] Surgical: [phone]

## 2015-06-28 NOTE — ED Notes (Signed)
This RN attempted to start ANCEF.  Admitting MD and Ortho at bedside.  Will come back

## 2015-06-28 NOTE — Progress Notes (Addendum)
Occupational Therapy Evaluation Patient Details Name: Cathy Wallace MRN: [id number] DOB: 1917-01-04 Today's Date: 06/28/2015    History of Present Illness 81 yo female nursing home patient who was admitted after sustaining a mechanical fall out of bed. She was seen in the ED and found to have a right humerus fracture.   Clinical Impression   Pt admitted with the above diagnoses and presents with below problem list. Pt will benefit from continued acute OT to address the below listed deficits and maximize independence with BADLs. PTA pt was a resident at [facility] and was w/c level with assist needed for transfers per chart review. Unsure exact prior level of assist with ADLs as pt is questionable historian but likely mod A. Pt is currently max to total A +2 for safety with rolling in bed. Session details below. If [facility] can provide current level of assist and therapy than recommend pt returning there at d/c. OT to continue to follow acutely      Follow Up Recommendations  Other (comment) (return to [facility] if can provide level of care, therapy)    Equipment Recommendations  Other (comment) (defer)    Recommendations for Other Services PT consult     Precautions / Restrictions Precautions Precautions: Fall;Other (comment) (NWB RUE, sling at all times except bathing/dressing) Required Braces or Orthoses: Sling Restrictions Weight Bearing Restrictions: Yes RUE Weight Bearing: Non weight bearing      Mobility Bed Mobility Overal bed mobility: Needs Assistance;+ 2 for safety/equipment Bed Mobility: Rolling Rolling: Max assist;+2 for safety/equipment;Total assist         General bed mobility comments: Max A to roll to left, +2 safety to protect RUE during rolling. Total A to partially roll to right to change bed pad. Pillow positioned under RUE.   Transfers                 General transfer comment: attempted supine to EOB with HOB elevated. Pt able to scoot  hips slightly to left and with mod A advanced BLE to EOB. With cues used LUE to hold bedrail and slightly elevated trunk with pt stating that she could not continue due to pain.     Balance Overall balance assessment: History of Falls (w/c level and assist for transfers at baseline)                                          ADL Overall ADL's : Needs assistance/impaired Eating/Feeding: NPO   Grooming: Total assistance   Upper Body Bathing: Total assistance   Lower Body Bathing: Total assistance   Upper Body Dressing : Total assistance   Lower Body Dressing: Total assistance     Toilet Transfer Details (indicate cue type and reason): bed pan with max A +2 safety/equipment to advance hips rolling into sidelying  Toileting- Clothing Manipulation and Hygiene: Bed level;Total assistance         General ADL Comments: Pt completed rolling to left and partial roll to right for bed pan and bed pad change. Max A +2 safety/equipment and to protect RUE. Pillow positioned under RUE during rolling. Educated pt on wrist/digit exercises with pt giving good return reps. Pt perseverating at times and noted to yell for assist vs. using call button. Call button positioned in pt's reach. Attempted supine>EOB with pt able to minimally initate with cues but declined completing citing pain in RUE.  Vision     Perception     Praxis      Pertinent Vitals/Pain Pain Assessment: Faces Faces Pain Scale: Hurts whole lot Pain Location: RUE with attempted bed mobility Pain Descriptors / Indicators: Grimacing;Operative site guarding Pain Intervention(s): Limited activity within patient's tolerance;Monitored during session;Repositioned     Hand Dominance     Extremity/Trunk Assessment Upper Extremity Assessment Upper Extremity Assessment: RUE deficits/detail;LUE deficits/detail;Difficult to assess due to impaired cognition RUE Deficits / Details: right humeral fracture, fiberglass  coaptation splint, sling RUE: Unable to fully assess due to immobilization LUE Deficits / Details: unable to fully assess due to cognition. pt able to use LUE to hold onto bed rail to assist minimally with scooting hips in bed to left. Generalized weakness at best, unsure of AROM.   Lower Extremity Assessment Lower Extremity Assessment: Defer to PT evaluation       Communication Communication Communication: HOH   Cognition Arousal/Alertness: Awake/alert Behavior During Therapy: WFL for tasks assessed/performed Overall Cognitive Status: No family/caregiver present to determine baseline cognitive functioning (unable to state where she is at)       Memory: Decreased recall of precautions;Decreased short-term memory             General Comments      Positioned pt with pillow behind RUE and under Rt forearm. educated pt and nurse on wrist and hand ROM and sling protocol. Per Dr. Ninfa Linden right wrist/digit ROM exercises, sling off for bathing/dressing is ok.         Exercises       Shoulder Instructions      Home Living Family/patient expects to be discharged to:: Skilled nursing facility                                 Additional Comments: from [facility]      Prior Functioning/Environment Level of Independence: Needs assistance  Gait / Transfers Assistance Needed: per chart review pt was at w/c level, assist for transfers  ADL's / Homemaking Assistance Needed: likely mod A   Comments: Pt reports she stands to get into w/c with +1 assist. Pt is questionable historian. Did state during bed level pericare that she normal stands "to get cleaned up."    OT Diagnosis: Generalized weakness;Acute pain;Cognitive deficits   OT Problem List: Decreased strength;Decreased activity tolerance;Impaired balance (sitting and/or standing);Decreased cognition;Decreased knowledge of use of DME or AE;Decreased knowledge of precautions;Impaired UE functional use;Pain   OT  Treatment/Interventions: Self-care/ADL training;Therapeutic exercise;DME and/or AE instruction;Therapeutic activities;Patient/family education;Balance training    OT Goals(Current goals can be found in the care plan section) Acute Rehab OT Goals Patient Stated Goal: not stated OT Goal Formulation: With patient Time For Goal Achievement: 07/05/15 Potential to Achieve Goals: Good ADL Goals Pt Will Perform Grooming: with mod assist;sitting Pt Will Perform Upper Body Dressing: with mod assist;sitting Pt Will Perform Lower Body Dressing: with mod assist;sitting/lateral leans;sit to/from stand;with adaptive equipment Pt Will Transfer to Toilet: with mod assist;stand pivot transfer;squat pivot transfer;bedside commode Additional ADL Goal #1: Pt/cg will be independent with sling protocol and mangement.  Additional ADL Goal #2: Pt/cg will be independent with right wrist and digit ROM exercises.   OT Frequency: Min 2X/week   Barriers to D/C:            Co-evaluation              End of Session Equipment Utilized During Treatment: Other (comment) (  sling) Nurse Communication: Mobility status;Other (comment) (sling protocol, finger and wrist ROM exercises)  Activity Tolerance: Patient limited by pain;Patient limited by fatigue Patient left: in bed;with call bell/phone within reach;with bed alarm set   Time: [id number] OT Time Calculation (min): 31 min Charges:  OT General Charges $OT Visit: 1 Procedure OT Evaluation $Initial OT Evaluation Tier I: 1 Procedure OT Treatments $Self Care/Home Management : 8-22 mins G-Codes:    Hortencia Pilar 2015/07/27, 11:17 AM

## 2015-06-28 NOTE — ED Notes (Addendum)
Spoke to Dr. Blaine Hamper about warning on Ancef for pt's Penicillin allergy.  Family denies patient allergies, okay'd by Dr. Blaine Hamper to continue administration and monitor patient.    Pt also requesting "sleepy medicine", which Dr. Blaine Hamper reviewed and will place for Ativan

## 2015-06-28 NOTE — Hospital Discharge Follow-Up (Addendum)
[facility] History and Physical  Cathy Wallace [id number] DOB: 1916/07/04 DOA: 06/27/2015  Referring physician: ED physician PCP: No primary care provider on file.  Specialists:   Chief Complaint: Right arm pain after fall  HPI: Cathy Wallace is a 81 y.o. female with PMH of hypertension, hyperlipidemia, GERD, depression, anxiety, CAD, S/p of stent placement, left foot drop, DVT, PE on Eliquis, spinal stenosis, pacemaker placement, breast cancer, who presents with right arm pain after fall.  Pt is from SNF. Per report, she fell out of bed and hit her head and injured her R upper am on a metal trash can denting it.She developed an obvious open fracture to the right upper arm and has a hematoma over right frontal head. Patient does not have abdominal pain, diarrhea, symptoms of UTI. She has chronic left foot drop, but no new unilateral weakness, vision change or hearing loss.  In ED, patient was found to have WBC 11.7, temperature normal, no tachycardia, electrolytes and renal function okay, x-ray of right arm showed mild distal comminuted humerus fracture. CT head is negative for intracranial abnormalities, but a shoulder soft tissue hematoma over right frontal area. Patient submitted 3 inpatient for further eval and treatment. Orthopedic surgeon, Dr. Ninfa Linden was consulted by EDP.  Where does patient live?   SNF Can patient participate in ADLs? None   Review of Systems:   General: no fevers, chills, no changes in body weight, has fatigue HEENT: no blurry vision, hearing changes or sore throat. Has frontal hematoma. Pulm: no dyspnea, coughing, wheezing CV: no chest pain, palpitations Abd: no nausea, vomiting, abdominal pain, diarrhea, constipation GU: no dysuria, burning on urination, increased urinary frequency, hematuria  Ext: no leg edema. Has R upper arm pain. Neuro: has left foot drop. no vision change or hearing loss Skin: no rash MSK: No muscle spasm, no deformity,  no limitation of range of movement in spin Heme: No easy bruising.  Travel history: No recent long distant travel.  Allergy:  Allergies  Allergen Reactions  . Aspirin Other (See Comments)    Pt taking xarelto   . Benadryl [Diphenhydramine] Other (See Comments)    unknown  . Codeine Other (See Comments)    unknown  . Pneumococcal Vaccines Other (See Comments)    unknown  . Penicillins Rash    Past Medical History  Diagnosis Date  . Heart attack ([place]) "two years ago"  . CAD (coronary artery disease)   . HTN (hypertension)   . HLD (hyperlipidemia)   . PE (pulmonary embolism)   . DVT (deep vein thrombosis) in pregnancy   . Left foot drop   . Spinal stenosis   . Pacemaker   . Breast cancer [facility])     Past Surgical History  Procedure Laterality Date  . Hip fracture surgery      Social History:  has no tobacco, alcohol, and drug history on file.  Family History:  Family History  Problem Relation Age of Onset  . Heart attack Mother      Prior to Admission medications   Not on File    Physical Exam: Filed Vitals:   06/27/15 2309 06/28/15 0100 06/28/15 0130 06/28/15 0304  BP:  129/65 148/50 143/60  Pulse: 68 68 67 68  Temp: 97.6 F (36.4 C)   98.4 F (36.9 C)  TempSrc: Axillary   Oral  Resp: 16 14 17 16   Height:    5\' 4"  (1.626 m)  Weight:    46.584 kg (102 lb 11.2 oz)  SpO2: 94% 94% 95% 96%   General: Not in acute distress HEENT: has R frontal hematoma.        Eyes: PERRL, EOMI, no scleral icterus.       ENT: No discharge from the ears and nose, no pharynx injection, no tonsillar enlargement.        Neck: No JVD, no bruit, no mass felt. Heme: No neck lymph node enlargement. Cardiac: S1/S2, RRR, No murmurs, No gallops or rubs. Pulm: No rales, wheezing, rhonchi or rubs. Abd: Soft, nondistended, nontender, no rebound pain, no organomegaly, BS present. Ext: No pitting leg edema bilaterally. 2+DP/PT pulse bilaterally. Has Left foot drop. Musculoskeletal: has  open fracture of R upper arm with tenderness.  Skin: No rashes.  Neuro: Alert, oriented X3, cranial nerves II-XII grossly intact, muscle strength 5/5 in all extremities, sensation to light touch intact.  Psych: Patient is not psychotic, no suicidal or hemocidal ideation.  Labs on Admission:  Basic Metabolic Panel:  Recent Labs Lab 06/27/15 2354  NA 139  K 3.5  CL 107  CO2 19*  GLUCOSE 169*  BUN 14  CREATININE 0.93  CALCIUM 8.7*   Liver Function Tests: No results for input(s): AST, ALT, ALKPHOS, BILITOT, PROT, ALBUMIN in the last 168 hours. No results for input(s): LIPASE, AMYLASE in the last 168 hours. No results for input(s): AMMONIA in the last 168 hours. CBC:  Recent Labs Lab 06/27/15 2354  WBC 11.7*  NEUTROABS 10.2*  HGB 12.4  HCT 38.4  MCV 97.0  PLT 183   Cardiac Enzymes:  Recent Labs Lab 06/28/15 0205  TROPONINI 0.03    BNP (last 3 results) No results for input(s): BNP in the last 8760 hours.  ProBNP (last 3 results) No results for input(s): PROBNP in the last 8760 hours.  CBG:  Recent Labs Lab 06/27/15 2328  GLUCAP 168*    Radiological Exams on Admission: Ct Head Wo Contrast  06/27/2015  CLINICAL DATA:  Recent fall with frontal hematoma, initial encounter EXAM: CT HEAD WITHOUT CONTRAST TECHNIQUE: Contiguous axial images were obtained from the base of the skull through the vertex without intravenous contrast. COMPARISON:  None. FINDINGS: Bony calvarium is intact. A soft tissue hematoma is noted in the frontal region just to the right of the midline. Diffuse atrophic changes and chronic white matter ischemic change is noted. No findings to suggest acute hemorrhage, acute infarction or space-occupying mass lesion are noted. IMPRESSION: Soft tissue hematoma in the frontal region No acute intracranial abnormality is noted. Electronically Signed   By: Inez Catalina M.D.   On: 06/27/2015 23:58   Dg Humerus Right  06/27/2015  CLINICAL DATA:  [place] out of  bed with arm pain, initial encounter EXAM: RIGHT HUMERUS - 2+ VIEW COMPARISON:  None. FINDINGS: Comminuted fracture of the mid to distal humerus is noted with some mild impaction and angulation at the fracture site. Degenerative changes of the right shoulder joint are noted. IMPRESSION: Mid to distal comminuted humeral fracture. Electronically Signed   By: Inez Catalina M.D.   On: 06/27/2015 23:40    EKG: Independently reviewed. QTC 597, PVC, bigeminy  Assessment/Plan Principal Problem:   Comminuted fracture of right humerus Active Problems:   PVC (premature ventricular contraction)   CAD (coronary artery disease)   HTN (hypertension)   HLD (hyperlipidemia)   PE (pulmonary embolism)   DVT (deep vein thrombosis) in pregnancy   Left foot drop   Spinal stenosis   Pacemaker   Humerus fracture  Comminuted open fracture  of right humerus: Orthopedic surgery was consulted. Dr. Ninfa Linden will see patient in morning. Per ED physician, patient is not a candidate for surgery per Dr. Ninfa Linden. No neurovascular compromise. -will admit to tele bed -has splint placed. -Pain control: morphine prn and percocet - follow up ortho recs - NPO - type and cross - INR/PTT -1 dose of Ancef was given in emergency room.  Leukocytosis: Likely due to stress-induced demargination. Patient does not have signs of infection. -Follow-up CBC  PVC with bigeminy: No chest pain.  -has pacemaker -trop x 3 -repeat EKG in AM  CAD (coronary artery disease): No chest pain. -prn NTG  HTN:  - On lisinopril, doxazosin and Diltiazem  PE (pulmonary embolism) and DVT: on Eliquis.  Now has R frontal hematoma, but no intracranial bleeding. -will continue Eliquis, will d/c if hematoma expanding.   DVT ppx:  On Eliquis  Code Status: DNR Family Communication:  Yes, patient's son  at bed side Disposition Plan: Admit to inpatient   Date of Service 06/28/2015    Ivor Costa [facility] Pager [phone]  If  7PM-7AM, please contact night-coverage [email or web address] Password [facility] 06/28/2015, 4:34 AM

## 2015-06-29 ENCOUNTER — Non-Acute Institutional Stay (SKILLED_NURSING_FACILITY): Payer: Medicare Other | Admitting: Adult Health

## 2015-06-29 DIAGNOSIS — IMO0002 Reserved for concepts with insufficient information to code with codable children: Secondary | ICD-10-CM

## 2015-06-29 DIAGNOSIS — T148XXA Other injury of unspecified body region, initial encounter: Secondary | ICD-10-CM

## 2015-06-29 DIAGNOSIS — S42301B Unspecified fracture of shaft of humerus, right arm, initial encounter for open fracture: Secondary | ICD-10-CM | POA: Diagnosis not present

## 2015-06-29 DIAGNOSIS — T148 Other injury of unspecified body region: Secondary | ICD-10-CM

## 2015-06-29 LAB — ABO/RH: ABO/RH(D): O POS

## 2015-06-30 ENCOUNTER — Non-Acute Institutional Stay (SKILLED_NURSING_FACILITY): Payer: Medicare Other | Admitting: Adult Health

## 2015-06-30 DIAGNOSIS — D62 Acute posthemorrhagic anemia: Secondary | ICD-10-CM | POA: Diagnosis not present

## 2015-06-30 DIAGNOSIS — S42301D Unspecified fracture of shaft of humerus, right arm, subsequent encounter for fracture with routine healing: Secondary | ICD-10-CM | POA: Diagnosis not present

## 2015-06-30 DIAGNOSIS — R238 Other skin changes: Secondary | ICD-10-CM | POA: Diagnosis not present

## 2015-07-01 ENCOUNTER — Encounter: Payer: Self-pay | Admitting: Adult Health

## 2015-07-01 NOTE — Progress Notes (Signed)
Patient ID: MIEKE MAUSS, female   DOB: 1917/04/24, 81 y.o.   MRN: [id number]    Nursing Home Location:  [place] of Service: SNF (31)  [name], DO as PCP - General (Geriatric Medicine) [facility] [place]  PCP: REED, TIFFANY, DO  Allergies  Allergen Reactions  . Aspirin Other (See Comments)    taking xarelto  . Codeine Nausea And Vomiting  . Benadryl [Diphenhydramine Hcl] Other (See Comments)    unknown  . Flu Virus Vaccine Other (See Comments)    unknown  . Penicillins Rash  . Pneumococcal Vaccines Other (See Comments)    unknown    Chief Complaint  Patient presents with  . Acute Visit    ? eval right arm s/p fracture, dressing changes    HPI:  Patient is a 81 y.o. female seen today at [facility] after a 24 hospitalization for a fall on 06/27/15.  She got out of bed without help and fell hitting her head and injuring her right upper arm. She has a large amt of bruising/hematoma to her forehead and face, CT of the head negative for acute bleed. She suffered a right distal open comminuted humerus fracture. Surgery was consulted with a sling recommended, no surgery. She was placed on Doxycycline for two weeks due to the laceration from the open fracture.   There was concern for her fall risk so her ativan that was scheduled, as well as vistaril were made prn. These medicines were started by psych due to severe anxiety/depression.  She reports pain to the right upper arm relieved with percocet. She denies any numbness to the right arm. The staff is asking that I assess her arm and order dressing changes if necessary to the upper arm. She is in a hard brace and a sling currently.  Review of Systems:  Review of Systems  Constitutional: Positive for activity change. Negative for fever, chills, diaphoresis, appetite change and fatigue.  HENT: Negative for congestion.   Respiratory:  Negative for cough and shortness of breath.   Cardiovascular: Negative for chest pain, palpitations and leg swelling.  Gastrointestinal: Negative for nausea, abdominal pain, constipation and abdominal distention.  Genitourinary: Negative for dysuria and difficulty urinating.  Musculoskeletal: Positive for joint swelling, arthralgias and gait problem. Negative for back pain.  Neurological: Positive for weakness. Negative for dizziness, tremors, seizures, syncope, facial asymmetry, speech difficulty, light-headedness, numbness and headaches.       H/o right foot drop  Psychiatric/Behavioral: Negative for behavioral problems, confusion and agitation. The patient is nervous/anxious.     Past Medical History  Diagnosis Date  . DVT (deep venous thrombosis) ([place]) 2011     Eliquis anticoagulation  . Pulmonary embolism [facility]) Jan 2012    Eliquis anticoagulation  . HTN (hypertension)   . Spinal stenosis   . Bradycardia, on admit 08/09/2012  . Acute MI ([place]) 08/10/2012    2D Echo - EF 55-60%, normal  . Depression   . GERD (gastroesophageal reflux disease)   . Cancer (HCC)     hx of breast cancer  . Arthritis   . Closed pelvic fracture ([place]) 11/11/2012  . Alcohol dependence ([place]) 11/15/2012  . Long term (current) use of anticoagulants 11/15/2012  . Sacral fracture, closed ([place]) 11/15/2012    Bilateral nondisplaced sacral fractures.   . L5 vertebral fracture ([place]) 11/15/2012    Fracture of the L5 left transverse process.    . Anemia 11/15/2012  .  B12 deficiency 12/01/2012  . Insomnia 12/01/2012  . Unspecified vitamin D deficiency 12/07/2012  . Anxiety 2013  . Osteoarthritis 02/10/2013  . Lumbar vertebral fracture (HCC) 06/15/2013    L3-4 12/15/20143  . Anginal pain ([place]) 2014  . Hyperlipemia 2014  . History of fall 2014  . Intertrochanteric fracture of left hip ([place])  11/16/48  . Coronary artery disease 2014  . Urinary tract infection 2015  . Constipation 2015  . Atherosclerosis 2015  . Cerebral  atrophy 2015  . Cerebrovascular disease 2015    Small vessel disease  . Right radial fracture 2014  . Right foot drop 2014  . Unstable gait  2014  . Cardiomegaly 2015  . Fracture of right superior pubic ramus ([place]) 2011    Superior and inferior pubic rami fractures   Past Surgical History  Procedure Laterality Date  . Abdominal hysterectomy    . Elbow surgery    . Cataract extraction Bilateral   . Cardiac catheterization  08/08/2012    2.5x17mm Emerge balloon predilation, 2.75x36mm VeriFLEX bare-metal stent was inserted into the RCA to cover proximal to mid segments of RCA deployed with Noncompliant Sprinter balloon 2.75x32mm up to 2.39mm dilated  . Orif wrist fracture Right 07/03/2013    Procedure: OPEN REDUCTION INTERNAL FIXATION (ORIF) RIGHT WRIST FRACTURE;  Surgeon: Linna Hoff, MD;  Location: [place];  Service: Orthopedics;  Laterality: Right;  . Intramedullary (im) nail intertrochanteric Left 11/18/2013    Procedure: IM NAIL  LEFT HIP;  Surgeon: Rozanna Box, MD;  Location: [facility];  Service: Orthopedics;  Laterality: Left;  . Left heart catheterization with coronary angiogram N/A 08/08/2012    Procedure: LEFT HEART CATHETERIZATION WITH CORONARY ANGIOGRAM;  Surgeon: Troy Sine, MD;  Location: [facility] CATH LAB;  Service: Cardiovascular;  Laterality: N/A;  . Percutaneous coronary stent intervention (pci-s) N/A 08/08/2012    Procedure: PERCUTANEOUS CORONARY STENT INTERVENTION (PCI-S);  Surgeon: Troy Sine, MD;  Location: [facility] C[facility] LAB;  Service: Cardiovascular;  Laterality: N/A;   Social History:   reports that she has never smoked. She has never used smokeless tobacco. She reports that she drinks alcohol. She reports that she does not use illicit drugs.  History reviewed. No pertinent family history.  Medications: Patient's Medications  New Prescriptions   No medications on file  Previous Medications   ACETAMINOPHEN (TYLENOL) 325 MG TABLET    Take 2 tablets (650 mg total) by mouth 3  (three) times daily.   APIXABAN (ELIQUIS) 2.5 MG TABS TABLET    Take 1 tablet (2.5 mg total) by mouth 2 (two) times daily.   BUSPIRONE (BUSPAR) 30 MG TABLET    Take 30 mg by mouth 2 (two) times daily.   DILTIAZEM (CARDIZEM CD) 180 MG 24 HR CAPSULE    Take 1 capsule (180 mg total) by mouth daily after lunch.   DOXAZOSIN (CARDURA) 8 MG TABLET    Take 8 mg by mouth daily.   FLUTICASONE (VERAMYST) 27.5 MCG/SPRAY NASAL SPRAY    Place 2 sprays into the nose daily.   HYDROXYZINE (ATARAX/VISTARIL) 25 MG TABLET    Take 25 mg by mouth 2 (two) times daily.   LISINOPRIL (PRINIVIL,ZESTRIL) 40 MG TABLET    Take 40 mg by mouth daily.   LOPERAMIDE (IMODIUM A-D) 2 MG TABLET    Take 2 mg by mouth. Take 2 mg four times daily as needed for diarrhea or loose stools   LORATADINE (CLARITIN) 10 MG TABLET    Take 10  mg by mouth daily as needed for allergies.    LORAZEPAM (ATIVAN) 1 MG TABLET    Take one tablet by mouth at lunch for anxiety   MELATONIN 3 MG TABS    Take 1 tablet by mouth at bedtime.   MIRABEGRON ER (MYRBETRIQ) 25 MG TB24 TABLET    Take 25 mg by mouth daily.   NITROGLYCERIN (NITROSTAT) 0.4 MG SL TABLET    Place 1 tablet (0.4 mg total) under the tongue every 5 (five) minutes x 3 doses as needed for chest pain.   OXYCODONE-ACETAMINOPHEN (PERCOCET/ROXICET) 5-325 MG TABLET    Take 1-2 tablets by mouth every 4 (four) hours as needed for severe pain.   POTASSIUM CHLORIDE (K-DUR) 10 MEQ TABLET    Take 10 mEq by mouth daily.   SERTRALINE (ZOLOFT) 100 MG TABLET    Take 150 mg by mouth daily.   TEMAZEPAM (RESTORIL) 15 MG CAPSULE    Take 15 mg by mouth at bedtime.  Modified Medications   No medications on file  Discontinued Medications   HYDROCODONE-ACETAMINOPHEN (NORCO/VICODIN) 5-325 MG PER TABLET    Take 1 tablet by mouth every 6 (six) hours as needed for severe pain.     Physical Exam: VS reviewed  Physical Exam  Constitutional: She is oriented to person, place, and time. No distress.  frail  HENT:    Significant ecchymoses to bilateral orbital area, hematoma to forehead with ecchymoses   Eyes: Conjunctivae and EOM are normal. Pupils are equal, round, and reactive to light. Right eye exhibits no discharge. Left eye exhibits no discharge.  Neck: No JVD present. No tracheal deviation present. No thyromegaly present.  Cardiovascular: Normal rate and regular rhythm.   No murmur heard. No edema to BLE  Pulmonary/Chest: Effort normal and breath sounds normal. No respiratory distress. She has no wheezes.  Abdominal: Soft. Bowel sounds are normal. She exhibits no distension. There is no tenderness.  Musculoskeletal: She exhibits edema and tenderness.  Right arm from hand to shoulder with significant edema and bruising. +CMS to right hand  Lymphadenopathy:    She has no cervical adenopathy.  Neurological: She is alert and oriented to person, place, and time. No cranial nerve deficit.  Skin: Skin is warm and dry. She is not diaphoretic.  Punctate wound to right upper arm no drainage or erythema, surrounding wound with blistering in the patter of the hypofix distribution  Psychiatric: She has a normal mood and affect.    Labs reviewed: Basic Metabolic Panel:  Recent Labs  08/02/14 02/14/15 03/24/15  NA 140 140 140  K 3.8 3.8 3.8  BUN 17 13 12   CREATININE 0.9 0.8 0.8   Liver Function Tests: No results for input(s): AST, ALT, ALKPHOS, BILITOT, PROT, ALBUMIN in the last 8760 hours. No results for input(s): LIPASE, AMYLASE in the last 8760 hours. No results for input(s): AMMONIA in the last 8760 hours. CBC:  Recent Labs  02/14/15 05/04/15  WBC 5.5 5.9  HGB 13.1 13.0  HCT 40 40  PLT 207 214   TSH: No results for input(s): TSH in the last 8760 hours. A1C: Lab Results  Component Value Date   HGBA1C 6.1* 08/10/2012   Lipid Panel: No results for input(s): CHOL, HDL, LDLCALC, TRIG, CHOLHDL, LDLDIRECT in the last 8760 hours.  Radiological Exams: EXAM: CT HEAD WITHOUT CONTRAST  06/27/15   TECHNIQUE: Contiguous axial images were obtained from the base of the skull through the vertex without intravenous contrast.   COMPARISON:  None.  FINDINGS: Bony calvarium is intact. A soft tissue hematoma is noted in the frontal region just to the right of the midline. Diffuse atrophic changes and chronic white matter ischemic change is noted. No findings to suggest acute hemorrhage, acute infarction or space-occupying mass lesion are noted.   IMPRESSION: Soft tissue hematoma in the frontal region   No acute intracranial abnormality is noted.     Interpretation Summary       CLINICAL DATA:  [place] out of bed with arm pain, initial encounter   EXAM: RIGHT HUMERUS - 2+ VIEW   COMPARISON:  None.   FINDINGS: Comminuted fracture of the mid to distal humerus is noted with some mild impaction and angulation at the fracture site. Degenerative changes of the right shoulder joint are noted.   IMPRESSION: Mid to distal comminuted humeral fracture.      Assessment/Plan  1. Humerus fracture, right, open, initial encounter -keep arm elevated with brace and sling in place -check CMS qshift to right arm -use percocet prn for pain -f/u with ortho as indicated  2. 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 Laceration (of right arm) -continue doxycycline -had reaction to hypofix tape, avoid adhesive -wound appear clean, apply vasoline gauze and absorbant dressing with ace wrap and change three times weekly -I do not see where a Tdap was given, will ask staff to administer   Cindi Carbon, [place] [phone]

## 2015-07-03 ENCOUNTER — Non-Acute Institutional Stay (SKILLED_NURSING_FACILITY): Payer: Medicare Other | Admitting: Internal Medicine

## 2015-07-03 ENCOUNTER — Encounter: Payer: Self-pay | Admitting: Adult Health

## 2015-07-03 DIAGNOSIS — T148 Other injury of unspecified body region: Secondary | ICD-10-CM | POA: Diagnosis not present

## 2015-07-03 DIAGNOSIS — I2699 Other pulmonary embolism without acute cor pulmonale: Secondary | ICD-10-CM | POA: Diagnosis not present

## 2015-07-03 DIAGNOSIS — IMO0002 Reserved for concepts with insufficient information to code with codable children: Secondary | ICD-10-CM

## 2015-07-03 DIAGNOSIS — G4700 Insomnia, unspecified: Secondary | ICD-10-CM | POA: Diagnosis not present

## 2015-07-03 DIAGNOSIS — M150 Primary generalized (osteo)arthritis: Secondary | ICD-10-CM | POA: Diagnosis not present

## 2015-07-03 DIAGNOSIS — S42301B Unspecified fracture of shaft of humerus, right arm, initial encounter for open fracture: Secondary | ICD-10-CM | POA: Diagnosis not present

## 2015-07-03 DIAGNOSIS — N3281 Overactive bladder: Secondary | ICD-10-CM | POA: Diagnosis not present

## 2015-07-03 DIAGNOSIS — F3341 Major depressive disorder, recurrent, in partial remission: Secondary | ICD-10-CM | POA: Diagnosis not present

## 2015-07-03 DIAGNOSIS — F411 Generalized anxiety disorder: Secondary | ICD-10-CM | POA: Diagnosis not present

## 2015-07-03 DIAGNOSIS — M159 Polyosteoarthritis, unspecified: Secondary | ICD-10-CM

## 2015-07-03 DIAGNOSIS — S42351B Displaced comminuted fracture of shaft of humerus, right arm, initial encounter for open fracture: Secondary | ICD-10-CM | POA: Diagnosis not present

## 2015-07-03 DIAGNOSIS — M8949 Other hypertrophic osteoarthropathy, multiple sites: Secondary | ICD-10-CM

## 2015-07-03 DIAGNOSIS — T148XXA Other injury of unspecified body region, initial encounter: Secondary | ICD-10-CM

## 2015-07-03 NOTE — Progress Notes (Signed)
Patient ID: Cathy Wallace, female   DOB: [birth year], 82 y.o.   MRN: [id number]  Provider:  Rexene Edison. Mariea Clonts, D.O., C.M.D. Location:  [facility] PCP: Hollace Kinnier, DO  Code Status: DNR Goals of Care: Advanced Directive information Does patient have an advance directive?: Yes, Type of Advance Directive: [place];Living will;Out of facility DNR (pink MOST or yellow form), Pre-existing out of facility DNR order (yellow form or pink MOST form): Yellow form placed in chart (order not valid for inpatient use), Does patient want to make changes to advanced directive?: No - Patient declined   Chief Complaint  Patient presents with  . Readmit To SNF    s/p hospitalization for fall with arm fracture  .    HPI: Patient is a 82 y.o. female seen today for readmission to SNF s/p hospitalization for fall with humeral fracture.  She was seen acutely upon return by NP Wert:  "She got out of bed without help and fell hitting her head and injuring her right upper arm. She has a large amt of bruising/hematoma to her forehead and face, CT of the head negative for acute bleed. She suffered a right distal open comminuted humerus fracture. Surgery was consulted with a sling recommended, no surgery. She was placed on Doxycycline for two weeks due to the laceration from the open fracture.   There was concern for her fall risk so her ativan that was scheduled, as well as vistaril were made prn. These medicines were started by psych due to severe anxiety/depression. She reports pain to the right upper arm relieved with percocet. She denies any numbness to the right arm. The staff is asking that I assess her arm and order dressing changes if necessary to the upper arm. She is in a hard brace and a sling currently."  She has a h/o PE on longstanding eliquis, but due to the large hematoma, high fall risk and a drop in her hgb, this has been discontinued.    12/30, she was seen by Dr. Ninfa Linden  and brace placed on her upper arm and not to be removed. 12/31, tetanus booster given.  She is to f/u with Dr. Ninfa Linden later this week.  Nursing notes were reviewed and she's been very anxious and forgetful over the past few days since returning from the hospital.    When seen today by me, she asked over and over what she should do next.  Staff report that this anxiety is worse off of the ativan (seems delirious).    Review of Systems  Constitutional: Negative for fever and chills.  HENT: Positive for hearing loss.        Hearing aides put in during visit  Eyes: Negative for blurred vision.  Respiratory: Negative for shortness of breath.   Cardiovascular: Negative for chest pain and leg swelling.  Gastrointestinal: Negative for abdominal pain, constipation, blood in stool and melena.  Genitourinary: Negative for dysuria.  Musculoskeletal: Positive for falls.  Skin:       Abrasion/laceration of right posterior arm; ecchymoses of face (raccoon eyes) and right posterior arm and shoulder, right hand overtly swollen, edematous and ecchymotic  Neurological: Positive for weakness. Negative for dizziness.  Psychiatric/Behavioral: Positive for memory loss.    Past Medical History  Diagnosis Date  . Heart attack ([place]) "two years ago"  . CAD (coronary artery disease)   . HLD (hyperlipidemia)   . PE (pulmonary embolism)   . DVT (deep vein thrombosis) in pregnancy   .  Left foot drop   . Pacemaker   . Breast cancer ([place])   . DVT (deep venous thrombosis) ([place]) 2011     Eliquis anticoagulation  . Pulmonary embolism [facility]) Jan 2012    Eliquis anticoagulation  . HTN (hypertension)   . Spinal stenosis   . Bradycardia, on admit 08/09/2012  . Acute MI ([place]) 08/10/2012    2D Echo - EF 55-60%, normal  . Depression   . GERD (gastroesophageal reflux disease)   . Cancer (HCC)     hx of breast cancer  . Arthritis   . Closed pelvic fracture ([place]) 11/11/2012  . Alcohol dependence ([place]) 11/15/2012  .  Long term (current) use of anticoagulants 11/15/2012  . Sacral fracture, closed ([place]) 11/15/2012    Bilateral nondisplaced sacral fractures.   . L5 vertebral fracture ([place]) 11/15/2012    Fracture of the L5 left transverse process.    . Anemia 11/15/2012  . B12 deficiency 12/01/2012  . Insomnia 12/01/2012  . Unspecified vitamin D deficiency 12/07/2012  . Anxiety 2013  . Osteoarthritis 02/10/2013  . Lumbar vertebral fracture (HCC) 06/15/2013    L3-4 12/15/20143  . Anginal pain ([place]) 2014  . Hyperlipemia 2014  . History of fall 2014  . Intertrochanteric fracture of left hip ([place])  11/16/48  . Coronary artery disease 2014  . Urinary tract infection 2015  . Constipation 2015  . Atherosclerosis 2015  . Cerebral atrophy 2015  . Cerebrovascular disease 2015    Small vessel disease  . Right radial fracture 2014  . Right foot drop 2014  . Unstable gait  2014  . Cardiomegaly 2015  . Fracture of right superior pubic ramus ([place]) 2011    Superior and inferior pubic rami fractures   Past Surgical History  Procedure Laterality Date  . Hip fracture surgery    . Abdominal hysterectomy    . Elbow surgery    . Cataract extraction Bilateral   . Cardiac catheterization  08/08/2012    2.5x68mm Emerge balloon predilation, 2.75x62mm VeriFLEX bare-metal stent was inserted into the RCA to cover proximal to mid segments of RCA deployed with Noncompliant Sprinter balloon 2.75x61mm up to 2.56mm dilated  . Orif wrist fracture Right 07/03/2013    Procedure: OPEN REDUCTION INTERNAL FIXATION (ORIF) RIGHT WRIST FRACTURE;  Surgeon: Linna Hoff, MD;  Location: [facility];  Service: Orthopedics;  Laterality: Right;  . Intramedullary (im) nail intertrochanteric Left 11/18/2013    Procedure: IM NAIL  LEFT HIP;  Surgeon: Rozanna Box, MD;  Location: [facility];  Service: Orthopedics;  Laterality: Left;  . Left heart catheterization with coronary angiogram N/A 08/08/2012    Procedure: LEFT HEART CATHETERIZATION WITH CORONARY ANGIOGRAM;   Surgeon: Troy Sine, MD;  Location[facility] CATH LAB;  Service: Cardiovascular;  Laterality: N/A;  . Percutaneous coronary stent intervention (pci-s) N/A 08/08/2012    Procedure: PERCUTANEOUS CORONARY STENT INTERVENTION (PCI-S);  Surgeon: Troy Sine, MD;  Location: [facility] CATH LAB;  Service: Cardiovascular;  Laterality: N/A;    reports that she has never smoked. She has never used smokeless tobacco. She reports that she drinks alcohol. She reports that she does not use illicit drugs. Family History  Problem Relation Age of Onset  . Heart attack Mother     Allergies  Allergen Reactions  . Aspirin Other (See Comments)    taking xarelto  . Codeine Nausea And Vomiting  . Aspirin Other (See Comments)    Pt taking xarelto   . Benadryl [Diphenhydramine] Other (See  Comments)    unknown  . Codeine Other (See Comments)    unknown  . Pneumococcal Vaccines Other (See Comments)    unknown  . Benadryl [Diphenhydramine Hcl] Other (See Comments)    unknown  . Flu Virus Vaccine Other (See Comments)    unknown  . Penicillins Rash  . Penicillins Rash  . Pneumococcal Vaccines Other (See Comments)    unknown      Medication List       This list is accurate as of: 07/03/15 11:59 PM.  Always use your most recent med list.               acetaminophen 325 MG tablet  Commonly known as:  TYLENOL  Take 650 mg by mouth 3 (three) times daily.     acetaminophen 325 MG tablet  Commonly known as:  TYLENOL  Take 2 tablets (650 mg total) by mouth 3 (three) times daily.     ALLERGY EYE DROPS 0.025 % ophthalmic solution  Generic drug:  ketotifen  Place 1 drop into both eyes daily as needed (allergies).     ELIQUIS 2.5 MG Tabs tablet  Generic drug:  apixaban  Take 2.5 mg by mouth 2 (two) times daily.     apixaban 2.5 MG Tabs tablet  Commonly known as:  ELIQUIS  Take 1 tablet (2.5 mg total) by mouth 2 (two) times daily.     busPIRone 30 MG tablet  Commonly known as:  BUSPAR  Take 30 mg by mouth  2 (two) times daily.     busPIRone 30 MG tablet  Commonly known as:  BUSPAR  Take 30 mg by mouth 2 (two) times daily.     diltiazem 180 MG 24 hr capsule  Commonly known as:  DILACOR XR  Take 180 mg by mouth daily.     diltiazem 180 MG 24 hr capsule  Commonly known as:  CARDIZEM CD  Take 1 capsule (180 mg total) by mouth daily after lunch.     doxazosin 8 MG tablet  Commonly known as:  CARDURA  Take 8 mg by mouth daily.     doxazosin 8 MG tablet  Commonly known as:  CARDURA  Take 8 mg by mouth every morning.     doxycycline 100 MG tablet  Commonly known as:  VIBRA-TABS  Take 1 tablet (100 mg total) by mouth 2 (two) times daily.     fluticasone 27.5 MCG/SPRAY nasal spray  Commonly known as:  VERAMYST  Place 2 sprays into the nose daily.     fluticasone 50 MCG/ACT nasal spray  Commonly known as:  FLONASE  Place 2 sprays into both nostrils daily.     hydrOXYzine 25 MG tablet  Commonly known as:  ATARAX/VISTARIL  Take 25 mg by mouth 2 (two) times daily.     hydrOXYzine 25 MG tablet  Commonly known as:  ATARAX/VISTARIL  Take 1 tablet (25 mg total) by mouth every 8 (eight) hours as needed.     lisinopril 40 MG tablet  Commonly known as:  PRINIVIL,ZESTRIL  Take 40 mg by mouth daily.     lisinopril 40 MG tablet  Commonly known as:  PRINIVIL,ZESTRIL  Take 40 mg by mouth every morning.     loperamide 2 MG tablet  Commonly known as:  IMODIUM A-D  Take 2 mg by mouth. Take 2 mg four times daily as needed for diarrhea or loose stools     loperamide 2 MG capsule  Commonly known as:  IMODIUM  Take 4 mg by mouth daily as needed for diarrhea or loose stools.     loratadine 10 MG tablet  Commonly known as:  CLARITIN  Take 10 mg by mouth daily as needed for allergies.     loratadine 10 MG tablet  Commonly known as:  CLARITIN  Take 10 mg by mouth daily as needed for allergies.     LORazepam 1 MG tablet  Commonly known as:  ATIVAN  Take one tablet by mouth at lunch for  anxiety     LORazepam 1 MG tablet  Commonly known as:  ATIVAN  Take 0.5 tablets (0.5 mg total) by mouth every 6 (six) hours as needed for anxiety.     Melatonin 3 MG Tabs  Take 3 mg by mouth at bedtime.     Melatonin 3 MG Tabs  Take 1 tablet by mouth at bedtime.     mirabegron ER 25 MG Tb24 tablet  Commonly known as:  MYRBETRIQ  Take 25 mg by mouth daily.     MYRBETRIQ 25 MG Tb24 tablet  Generic drug:  mirabegron ER  Take 25 mg by mouth every morning.     nitroGLYCERIN 0.4 MG SL tablet  Commonly known as:  NITROSTAT  Place 0.4 mg under the tongue every 5 (five) minutes as needed for chest pain.     nitroGLYCERIN 0.4 MG SL tablet  Commonly known as:  NITROSTAT  Place 1 tablet (0.4 mg total) under the tongue every 5 (five) minutes x 3 doses as needed for chest pain.     oxyCODONE-acetaminophen 5-325 MG tablet  Commonly known as:  PERCOCET/ROXICET  Take 1-2 tablets by mouth every 4 (four) hours as needed for severe pain.     oxyCODONE-acetaminophen 5-325 MG tablet  Commonly known as:  PERCOCET/ROXICET  Take 1 tablet by mouth every 4 (four) hours as needed for moderate pain.     potassium chloride 10 MEQ tablet  Commonly known as:  K-DUR  Take 10 mEq by mouth daily.     potassium chloride 10 MEQ tablet  Commonly known as:  K-DUR,KLOR-CON  Take 10 mEq by mouth every morning.     sertraline 100 MG tablet  Commonly known as:  ZOLOFT  Take 150 mg by mouth daily.     sertraline 100 MG tablet  Commonly known as:  ZOLOFT  Take 150 mg by mouth every morning.     temazepam 15 MG capsule  Commonly known as:  RESTORIL  Take 15 mg by mouth at bedtime.     temazepam 15 MG capsule  Commonly known as:  RESTORIL  Take 1 capsule (15 mg total) by mouth at bedtime.        Physical Exam: Filed Vitals:   07/04/15 1025  BP: 122/56  Pulse: 52  Temp: 99.3 F (37.4 C)  Resp: 15  Height: 5\' 3"  (1.6 m)  Weight: 101 lb (45.813 kg)  SpO2: 95%   Body mass index is 17.9  kg/(m^2). Physical Exam  Constitutional:  Thin white female sitting in her recliner chair  HENT:  Right Ear: External ear normal.  Left Ear: External ear normal.  Nose: Nose normal.  Mouth/Throat: Oropharynx is clear and moist.  Raccoon eyes  Eyes: Conjunctivae and EOM are normal. Pupils are equal, round, and reactive to light.  Neck: Neck supple. No JVD present. No thyromegaly present.  Cardiovascular: Normal rate, regular rhythm and intact distal pulses.   Murmur heard. Pulmonary/Chest: Effort normal and breath sounds  normal. No respiratory distress. She has no wheezes. She has no rales.  Abdominal: Soft. Bowel sounds are normal. She exhibits no distension. There is no tenderness.  Musculoskeletal:   brace in place on right upper arm due to displaced fracture   Lymphadenopathy:    She has no cervical adenopathy.  Neurological: She is alert. No cranial nerve deficit.  Drowsy off and on  Skin:  See ros  Psychiatric:  Delirious--repeating same questions every couple of minutes "what do I do now, what do I do next?"    Labs reviewed: Basic Metabolic Panel:  Recent Labs  03/24/15 06/27/15 2354 06/28/15 0600  NA 140 139 139  K 3.8 3.5 3.6  CL  --  107 107  CO2  --  19* 24  GLUCOSE  --  169* 120*  BUN 12 14 14   CREATININE 0.8 0.93 0.90  CALCIUM  --  8.7* 8.6*   Liver Function Tests:  Recent Labs  06/28/15 0600  AST 18  ALT 13*  ALKPHOS 77  BILITOT 0.6  PROT 5.2*  ALBUMIN 3.2*   No results for input(s): LIPASE, AMYLASE in the last 8760 hours. No results for input(s): AMMONIA in the last 8760 hours. CBC:  Recent Labs  05/04/15 06/27/15 2354 06/28/15 0600  WBC 5.9 11.7* 9.8  NEUTROABS  --  10.2*  --   HGB 13.0 12.4 11.1*  HCT 40 38.4 33.7*  MCV  --  97.0 96.8  PLT 214 183 180   Cardiac Enzymes:  Recent Labs  06/28/15 0205 06/28/15 0600 06/28/15 1330  TROPONINI 0.03 0.03 0.04*   BNP: Invalid input(s): POCBNP Lab Results  Component Value Date     HGBA1C 6.1* 08/10/2012   Lab Results  Component Value Date   TSH 2.082 08/10/2012   Lab Results  Component Value Date   VITAMINB12 410 11/21/2013   Lab Results  Component Value Date   FOLATE 5.4 11/21/2013   Lab Results  Component Value Date   IRON 57 11/21/2013   TIBC 317 11/21/2013   FERRITIN 107 11/21/2013    Imaging and Procedures obtained prior to SNF admission: Ct Head Wo Contrast  06/27/2015  CLINICAL DATA:  Recent fall with frontal hematoma, initial encounter EXAM: CT HEAD WITHOUT CONTRAST TECHNIQUE: Contiguous axial images were obtained from the base of the skull through the vertex without intravenous contrast. COMPARISON:  None. FINDINGS: Bony calvarium is intact. A soft tissue hematoma is noted in the frontal region just to the right of the midline. Diffuse atrophic changes and chronic white matter ischemic change is noted. No findings to suggest acute hemorrhage, acute infarction or space-occupying mass lesion are noted. IMPRESSION: Soft tissue hematoma in the frontal region No acute intracranial abnormality is noted. Electronically Signed   By: Inez Catalina M.D.   On: 06/27/2015 23:58   Dg Humerus Right  06/27/2015  CLINICAL DATA:  [place] out of bed with arm pain, initial encounter EXAM: RIGHT HUMERUS - 2+ VIEW COMPARISON:  None. FINDINGS: Comminuted fracture of the mid to distal humerus is noted with some mild impaction and angulation at the fracture site. Degenerative changes of the right shoulder joint are noted. IMPRESSION: Mid to distal comminuted humeral fracture. Electronically Signed   By: Inez Catalina M.D.   On: 06/27/2015 23:40    Assessment/Plan 1. Humerus fracture, right, open, initial encounter -displaced and with open area on posterior arm -cont brace as per orthopedics and sling -f/u as planned later this week -cont tylenol scheduled, percocet  q 4 prn -needs close supervision due to ongoing delirium from pain, pain medicine, anxiety meds, sleeping  pill, baseline cognitive losses  2. Hematoma -of forehead -CT was negative for acute bleed, but pt's cognition still not back to baseline as noted above  3. Laceration -right posterior arm--being cleaned and dressed daily  4. Generalized anxiety disorder -ativan was resumed per prior dosing due to her anxiety -? How much at this point is delirium not anxiety - may need increased pain meds and working on decreasing anxiety and insomnia meds with more supervision in place  5. Insomnia -is on temazepam for sleep which is well known for altering cognition in those with some cognitive losses already at play--would like to get her off of this in time  6. Other pulmonary embolism without acute cor pulmonale, unspecified chronicity (HCC) -we opted to stop her eliquis due to drop in hgb, large hematoma, bruising, open fracture -f/u cbc  7. Primary osteoarthritis involving multiple joints -on chronic scheduled tylenol for this--watch total tylenol per day with percocet use  8. OAB (overactive bladder) -on myrbetriq 25mg  daily to decrease trips to restroom--if she is not ambulating right now, this is probably not helpful to continue  9. Recurrent major depressive disorder, in partial remission ([place]) -continues on zoloft 150mg  po daily plus her ativan and vistaril and buspar  Functional status: skilled level of care--requires assist with adls except feeding  Family/ staff Communication: discussed with SNF nurse, nurse manager  Labs/tests ordered:  F/u cbc next wk  Taquita Demby L. Crue Otero, D.O. [facility] [address], [place] [address] Cell Phone (Mon-Fri 8am-5pm):  [phone] On Call:  [phone] & follow prompts after 5pm & weekends Office Phone:  [phone] Office Fax:  [phone]

## 2015-07-04 ENCOUNTER — Encounter: Payer: Self-pay | Admitting: Internal Medicine

## 2015-07-04 LAB — CBC AND DIFFERENTIAL
HCT: 26 % — AB (ref 36–46)
Hemoglobin: 8.6 g/dL — AB (ref 12.0–16.0)
PLATELETS: 266 10*3/uL (ref 150–399)
WBC: 8.2 10^3/mL

## 2015-07-10 ENCOUNTER — Encounter: Payer: Self-pay | Admitting: Adult Health

## 2015-07-10 NOTE — Progress Notes (Signed)
Patient ID: Cathy Wallace, female   DOB: [birth year], 82 y.o.   MRN: [id number]    Nursing Home Location:  [place] of Service: SNF (31)  [name], DO as PCP - General (Geriatric Medicine) [facility] [place]  PCP: REED, TIFFANY, DO  Allergies  Allergen Reactions  . Aspirin Other (See Comments)    taking xarelto  . Codeine Nausea And Vomiting  . Aspirin Other (See Comments)    Pt taking xarelto   . Benadryl [Diphenhydramine] Other (See Comments)    unknown  . Codeine Other (See Comments)    unknown  . Pneumococcal Vaccines Other (See Comments)    unknown  . Benadryl [Diphenhydramine Hcl] Other (See Comments)    unknown  . Flu Virus Vaccine Other (See Comments)    unknown  . Penicillins Rash  . Penicillins Rash  . Pneumococcal Vaccines Other (See Comments)    unknown    Chief Complaint  Patient presents with  . Acute Visit    blistering of skin, pale    HPI:  Patient is a 82 y.o. female seen at [facility] after a 24 hospitalization for a fall on 06/27/15.  She got out of bed without help and fell hitting her head and injuring her right upper arm. She has a large amt of bruising/hematoma to her forehead and face, CT of the head negative for acute bleed. She suffered a right distal open comminuted humerus fracture. Surgery was consulted with a sling recommended, no surgery. She was placed on Doxycycline for two weeks due to the laceration from the open fracture.   The staff asked that I see her today due to blistering of her right arm.  This was noted yesterday surrounding the right arm wound and was due to a reaction to tape. Today there is blistering to the right elbow and concern for increasing edema to the arm.  She remains in a sling with a hard brace. Resident reports increasing pain unrelieved by percocet. Denies numbness or tingling to the right hand.  Also resident is pale  today and generally not feeling well per staff.   Review of Systems:  Review of Systems  Constitutional: Positive for activity change and fatigue. Negative for fever, chills, diaphoresis and appetite change.  HENT: Negative for congestion.   Respiratory: Negative for cough and shortness of breath.   Cardiovascular: Negative for chest pain, palpitations and leg swelling.  Gastrointestinal: Positive for nausea (due to pain per resident). Negative for abdominal pain, constipation and abdominal distention.  Genitourinary: Negative for dysuria and difficulty urinating.  Musculoskeletal: Positive for joint swelling, arthralgias and gait problem. Negative for back pain.  Skin: Positive for pallor (face) and wound. Negative for rash.       Fluid filled blisters to right arm  Neurological: Positive for weakness. Negative for dizziness, tremors, seizures, syncope, facial asymmetry, speech difficulty, light-headedness, numbness and headaches.       H/o right foot drop  Psychiatric/Behavioral: Negative for behavioral problems, confusion and agitation. The patient is nervous/anxious.     Past Medical History  Diagnosis Date  . Heart attack ([place]) "two years ago"  . CAD (coronary artery disease)   . HLD (hyperlipidemia)   . PE (pulmonary embolism)   . DVT (deep vein thrombosis) in pregnancy   . Left foot drop   . Pacemaker   . Breast cancer ([place])   . DVT (deep venous thrombosis) ([place]) 2011  Eliquis anticoagulation  . Pulmonary embolism [facility]) Jan 2012    Eliquis anticoagulation  . HTN (hypertension)   . Spinal stenosis   . Bradycardia, on admit 08/09/2012  . Acute MI ([place]) 08/10/2012    2D Echo - EF 55-60%, normal  . Depression   . GERD (gastroesophageal reflux disease)   . Cancer (HCC)     hx of breast cancer  . Arthritis   . Closed pelvic fracture ([place]) 11/11/2012  . Alcohol dependence ([place]) 11/15/2012  . Long term (current) use of anticoagulants 11/15/2012  . Sacral fracture, closed  ([place]) 11/15/2012    Bilateral nondisplaced sacral fractures.   . L5 vertebral fracture ([place]) 11/15/2012    Fracture of the L5 left transverse process.    . Anemia 11/15/2012  . B12 deficiency 12/01/2012  . Insomnia 12/01/2012  . Unspecified vitamin D deficiency 12/07/2012  . Anxiety 2013  . Osteoarthritis 02/10/2013  . Lumbar vertebral fracture (HCC) 06/15/2013    L3-4 12/15/20143  . Anginal pain ([place]) 2014  . Hyperlipemia 2014  . History of fall 2014  . Intertrochanteric fracture of left hip ([place])  11/16/48  . Coronary artery disease 2014  . Urinary tract infection 2015  . Constipation 2015  . Atherosclerosis 2015  . Cerebral atrophy 2015  . Cerebrovascular disease 2015    Small vessel disease  . Right radial fracture 2014  . Right foot drop 2014  . Unstable gait  2014  . Cardiomegaly 2015  . Fracture of right superior pubic ramus ([place]) 2011    Superior and inferior pubic rami fractures   Past Surgical History  Procedure Laterality Date  . Hip fracture surgery    . Abdominal hysterectomy    . Elbow surgery    . Cataract extraction Bilateral   . Cardiac catheterization  08/08/2012    2.5x21mm Emerge balloon predilation, 2.75x47mm VeriFLEX bare-metal stent was inserted into the RCA to cover proximal to mid segments of RCA deployed with Noncompliant Sprinter balloon 2.75x4mm up to 2.1mm dilated  . Orif wrist fracture Right 07/03/2013    Procedure: OPEN REDUCTION INTERNAL FIXATION (ORIF) RIGHT WRIST FRACTURE;  Surgeon: Linna Hoff, MD;  Location: [place];  Service: Orthopedics;  Laterality: Right;  . Intramedullary (im) nail intertrochanteric Left 11/18/2013    Procedure: IM NAIL  LEFT HIP;  Surgeon: Rozanna Box, MD;  Location: [place];  Service: Orthopedics;  Laterality: Left;  . Left heart catheterization with coronary angiogram N/A 08/08/2012    Procedure: LEFT HEART CATHETERIZATION WITH CORONARY ANGIOGRAM;  Surgeon: Troy Sine, MD;  Location[facility] CATH LAB;  Service: Cardiovascular;   Laterality: N/A;  . Percutaneous coronary stent intervention (pci-s) N/A 08/08/2012    Procedure: PERCUTANEOUS CORONARY STENT INTERVENTION (PCI-S);  Surgeon: Troy Sine, MD;  Location: [facility] CATH LAB;  Service: Cardiovascular;  Laterality: N/A;   Social History:   reports that she has never smoked. She has never used smokeless tobacco. She reports that she drinks alcohol. She reports that she does not use illicit drugs.  Family History  Problem Relation Age of Onset  . Heart attack Mother     Medications: Patient's Medications  New Prescriptions   No medications on file  Previous Medications   ACETAMINOPHEN (TYLENOL) 325 MG TABLET    Take 650 mg by mouth 3 (three) times daily.   APIXABAN (ELIQUIS) 2.5 MG TABS TABLET    Take 1 tablet (2.5 mg total) by mouth 2 (two) times daily.   BUSPIRONE (BUSPAR) 30  MG TABLET    Take 30 mg by mouth 2 (two) times daily.   DILTIAZEM (CARDIZEM CD) 180 MG 24 HR CAPSULE    Take 1 capsule (180 mg total) by mouth daily after lunch.   DOXAZOSIN (CARDURA) 8 MG TABLET    Take 8 mg by mouth every morning.   DOXYCYCLINE (VIBRA-TABS) 100 MG TABLET    Take 1 tablet (100 mg total) by mouth 2 (two) times daily.   FLUTICASONE (VERAMYST) 27.5 MCG/SPRAY NASAL SPRAY    Place 2 sprays into the nose daily.   HYDROXYZINE (ATARAX/VISTARIL) 25 MG TABLET    Take 25 mg by mouth 2 (two) times daily.   HYDROXYZINE (ATARAX/VISTARIL) 25 MG TABLET    Take 1 tablet (25 mg total) by mouth every 8 (eight) hours as needed.   KETOTIFEN (ALLERGY EYE DROPS) 0.025 % OPHTHALMIC SOLUTION    Place 1 drop into both eyes daily as needed (allergies).   LISINOPRIL (PRINIVIL,ZESTRIL) 40 MG TABLET    Take 40 mg by mouth every morning.   LOPERAMIDE (IMODIUM A-D) 2 MG TABLET    Take 2 mg by mouth. Take 2 mg four times daily as needed for diarrhea or loose stools   LORATADINE (CLARITIN) 10 MG TABLET    Take 10 mg by mouth daily as needed for allergies.    LORAZEPAM (ATIVAN) 1 MG TABLET    Take one tablet  by mouth at lunch for anxiety   LORAZEPAM (ATIVAN) 1 MG TABLET    Take 0.5 tablets (0.5 mg total) by mouth every 6 (six) hours as needed for anxiety.   MELATONIN 3 MG TABS    Take 1 tablet by mouth at bedtime.   MIRABEGRON ER (MYRBETRIQ) 25 MG TB24 TABLET    Take 25 mg by mouth daily.   NITROGLYCERIN (NITROSTAT) 0.4 MG SL TABLET    Place 1 tablet (0.4 mg total) under the tongue every 5 (five) minutes x 3 doses as needed for chest pain.   OXYCODONE-ACETAMINOPHEN (PERCOCET/ROXICET) 5-325 MG TABLET    Take 1-2 tablets by mouth every 4 (four) hours as needed for severe pain.   POTASSIUM CHLORIDE (K-DUR) 10 MEQ TABLET    Take 10 mEq by mouth daily.   SERTRALINE (ZOLOFT) 100 MG TABLET    Take 150 mg by mouth daily.   TEMAZEPAM (RESTORIL) 15 MG CAPSULE    Take 15 mg by mouth at bedtime.  Modified Medications   No medications on file  Discontinued Medications   ACETAMINOPHEN (TYLENOL) 325 MG TABLET    Take 2 tablets (650 mg total) by mouth 3 (three) times daily.   APIXABAN (ELIQUIS) 2.5 MG TABS TABLET    Take 2.5 mg by mouth 2 (two) times daily.   BUSPIRONE (BUSPAR) 30 MG TABLET    Take 30 mg by mouth 2 (two) times daily.   DILTIAZEM (DILACOR XR) 180 MG 24 HR CAPSULE    Take 180 mg by mouth daily.   DOXAZOSIN (CARDURA) 8 MG TABLET    Take 8 mg by mouth daily.   FLUTICASONE (FLONASE) 50 MCG/ACT NASAL SPRAY    Place 2 sprays into both nostrils daily.   LISINOPRIL (PRINIVIL,ZESTRIL) 40 MG TABLET    Take 40 mg by mouth daily.   LOPERAMIDE (IMODIUM) 2 MG CAPSULE    Take 4 mg by mouth daily as needed for diarrhea or loose stools.   LORATADINE (CLARITIN) 10 MG TABLET    Take 10 mg by mouth daily as needed for allergies.  MELATONIN 3 MG TABS    Take 3 mg by mouth at bedtime.   MIRABEGRON ER (MYRBETRIQ) 25 MG TB24 TABLET    Take 25 mg by mouth every morning.   NITROGLYCERIN (NITROSTAT) 0.4 MG SL TABLET    Place 0.4 mg under the tongue every 5 (five) minutes as needed for chest pain.   OXYCODONE-ACETAMINOPHEN  (PERCOCET/ROXICET) 5-325 MG TABLET    Take 1 tablet by mouth every 4 (four) hours as needed for moderate pain.   POTASSIUM CHLORIDE (K-DUR,KLOR-CON) 10 MEQ TABLET    Take 10 mEq by mouth every morning.   SERTRALINE (ZOLOFT) 100 MG TABLET    Take 150 mg by mouth every morning.   TEMAZEPAM (RESTORIL) 15 MG CAPSULE    Take 1 capsule (15 mg total) by mouth at bedtime.     Physical Exam: VS reviewed  Physical Exam  Constitutional: She is oriented to person, place, and time. No distress.  frail  HENT:  Significant ecchymoses to bilateral orbital area, hematoma to forehead with ecchymoses   Eyes: Conjunctivae and EOM are normal. Pupils are equal, round, and reactive to light. Right eye exhibits no discharge. Left eye exhibits no discharge.  Neck: No JVD present. No tracheal deviation present. No thyromegaly present.  Cardiovascular: Normal rate and regular rhythm.   No murmur heard. No edema to BLE  Pulmonary/Chest: Effort normal and breath sounds normal. No respiratory distress. She has no wheezes.  Abdominal: Soft. Bowel sounds are normal. She exhibits no distension. There is no tenderness.  Musculoskeletal: She exhibits edema and tenderness.  Right arm from hand to shoulder with significant edema and bruising. +CMS to right hand  Lymphadenopathy:    She has no cervical adenopathy.  Neurological: She is alert and oriented to person, place, and time. No cranial nerve deficit.  Skin: Skin is warm and dry. She is not diaphoretic. There is pallor.  Punctate wound to right upper arm no drainage or erythema, surrounding wound with blistering in the patter of the hypofix distribution.  Fluid filled blister to right elbow without purulent matter or erythema  Psychiatric: She has a normal mood and affect.    Labs reviewed: Basic Metabolic Panel:  Recent Labs  03/24/15 06/27/15 2354 06/28/15 0600  NA 140 139 139  K 3.8 3.5 3.6  CL  --  107 107  CO2  --  19* 24  GLUCOSE  --  169* 120*    BUN 12 14 14   CREATININE 0.8 0.93 0.90  CALCIUM  --  8.7* 8.6*   Liver Function Tests:  Recent Labs  06/28/15 0600  AST 18  ALT 13*  ALKPHOS 77  BILITOT 0.6  PROT 5.2*  ALBUMIN 3.2*   No results for input(s): LIPASE, AMYLASE in the last 8760 hours. No results for input(s): AMMONIA in the last 8760 hours. CBC:  Recent Labs  05/04/15 06/27/15 2354 06/28/15 0600  WBC 5.9 11.7* 9.8  NEUTROABS  --  10.2*  --   HGB 13.0 12.4 11.1*  HCT 40 38.4 33.7*  MCV  --  97.0 96.8  PLT 214 183 180   TSH: No results for input(s): TSH in the last 8760 hours. A1C: Lab Results  Component Value Date   HGBA1C 6.1* 08/10/2012   Lipid Panel: No results for input(s): CHOL, HDL, LDLCALC, TRIG, CHOLHDL, LDLDIRECT in the last 8760 hours.  Radiological Exams: EXAM: CT HEAD WITHOUT CONTRAST 06/27/15   TECHNIQUE: Contiguous axial images were obtained from the base of the skull  through the vertex without intravenous contrast.   COMPARISON:  None.   FINDINGS: Bony calvarium is intact. A soft tissue hematoma is noted in the frontal region just to the right of the midline. Diffuse atrophic changes and chronic white matter ischemic change is noted. No findings to suggest acute hemorrhage, acute infarction or space-occupying mass lesion are noted.   IMPRESSION: Soft tissue hematoma in the frontal region   No acute intracranial abnormality is noted.     Interpretation Summary       CLINICAL DATA:  [place] out of bed with arm pain, initial encounter   EXAM: RIGHT HUMERUS - 2+ VIEW   COMPARISON:  None.   FINDINGS: Comminuted fracture of the mid to distal humerus is noted with some mild impaction and angulation at the fracture site. Degenerative changes of the right shoulder joint are noted.   IMPRESSION: Mid to distal comminuted humeral fracture.      Assessment/Plan  1. Blisters of multiple sites -due to edema in her arm, keep elevated with sling and monitor CMS  2. Open  fracture humerus shaft, right, with routine healing, subsequent encounter -needs to see ortho today as her brace is malfitting and uncomfortable causing increased pain -no signs of vascular compromise  3. Acute blood loss anemia -check CBC, if H/H dropping will hold eliquis (given her fall risk and age this may be appropriate)   Cindi Carbon, [place] [phone]

## 2015-07-11 ENCOUNTER — Non-Acute Institutional Stay (SKILLED_NURSING_FACILITY): Payer: Medicare Other | Admitting: Internal Medicine

## 2015-07-11 DIAGNOSIS — R410 Disorientation, unspecified: Secondary | ICD-10-CM

## 2015-07-11 DIAGNOSIS — S42301P Unspecified fracture of shaft of humerus, right arm, subsequent encounter for fracture with malunion: Secondary | ICD-10-CM | POA: Diagnosis not present

## 2015-07-11 DIAGNOSIS — T148XXA Other injury of unspecified body region, initial encounter: Secondary | ICD-10-CM

## 2015-07-11 DIAGNOSIS — T148 Other injury of unspecified body region: Secondary | ICD-10-CM

## 2015-07-11 DIAGNOSIS — F05 Delirium due to known physiological condition: Secondary | ICD-10-CM

## 2015-07-11 LAB — CBC AND DIFFERENTIAL
HCT: 28 % — AB (ref 36–46)
Hemoglobin: 9.4 g/dL — AB (ref 12.0–16.0)
WBC: 8.5 10*3/mL

## 2015-07-12 NOTE — Progress Notes (Signed)
Patient ID: Cathy Wallace, female   DOB: 1917-02-20, 82 y.o.   MRN: [id number]   Location: [facility] Provider: Toriano Aikey L. Mariea Clonts, D.O., C.M.D.  Code Status: DNR Goals of Care: Advanced Directive information Advanced Directives 07/04/2015  Does patient have an advance directive? Yes  Type of Paramedic of [place];Living will;Out of facility DNR (pink MOST or yellow form)  Does patient want to make changes to advanced directive? No - Patient declined  Copy of advanced directive(s) in chart? Yes  Pre-existing out of facility DNR order (yellow form or pink MOST form) Yellow form placed in chart (order not valid for inpatient use)     Chief Complaint  Patient presents with  . Acute Visit    right arm dressing change being performed and evening supervisor noted "increased deformity of upper arm"--pt with increased pain in her arm late afternoon and early evening and especially after brace was removed to do the dressing change    HPI: Patient is a 82 y.o. female seen in the office today for an acute visit for concerns about her displaced right humeral fracture appearing worse during dressing change.  She was noted to have no movement of her lower arm when the upper arm was moved.  Her pain was increased.  She was to wear the immobilizer/brace on her upper arm due to the displaced nature of the fracture in the hopes it would approximate and begin to heal so a hard cast could be placed.  She's had ongoing swelling of her right hand, bruising of the right arm and her face from the original fall.  We've taken her off of eliquis due to worsening anemia and h/h has improved since then.  She remains delirious ever since her fall, pain med and anxiety med use.  She asks over and over what she should do next.     Review of Systems:  Review of Systems  Constitutional: Positive for malaise/fatigue. Negative for fever.  HENT: Positive for hearing loss.   Respiratory: Negative  for shortness of breath.   Cardiovascular: Negative for chest pain.  Gastrointestinal: Negative for abdominal pain.  Genitourinary: Negative for dysuria.  Musculoskeletal: Negative for falls.       Right arm pain and deformity  Skin:       Bruising, open area posterior right arm  Neurological: Positive for weakness. Negative for dizziness.  Psychiatric/Behavioral: Positive for depression and memory loss. The patient is nervous/anxious.     Past Medical History  Diagnosis Date  . Heart attack ([place]) "two years ago"  . CAD (coronary artery disease)   . HLD (hyperlipidemia)   . PE (pulmonary embolism)   . DVT (deep vein thrombosis) in pregnancy   . Left foot drop   . Pacemaker   . Breast cancer ([place])   . DVT (deep venous thrombosis) ([place]) 2011     Eliquis anticoagulation  . Pulmonary embolism [facility]) Jan 2012    Eliquis anticoagulation  . HTN (hypertension)   . Spinal stenosis   . Bradycardia, on admit 08/09/2012  . Acute MI ([place]) 08/10/2012    2D Echo - EF 55-60%, normal  . Depression   . GERD (gastroesophageal reflux disease)   . Cancer (HCC)     hx of breast cancer  . Arthritis   . Closed pelvic fracture ([place]) 11/11/2012  . Alcohol dependence ([place]) 11/15/2012  . Long term (current) use of anticoagulants 11/15/2012  . Sacral fracture, closed ([place]) 11/15/2012    Bilateral  nondisplaced sacral fractures.   . L5 vertebral fracture ([place]) 11/15/2012    Fracture of the L5 left transverse process.    . Anemia 11/15/2012  . B12 deficiency 12/01/2012  . Insomnia 12/01/2012  . Unspecified vitamin D deficiency 12/07/2012  . Anxiety 2013  . Osteoarthritis 02/10/2013  . Lumbar vertebral fracture (HCC) 06/15/2013    L3-4 12/15/20143  . Anginal pain ([place]) 2014  . Hyperlipemia 2014  . History of fall 2014  . Intertrochanteric fracture of left hip ([place])  11/16/48  . Coronary artery disease 2014  . Urinary tract infection 2015  . Constipation 2015  . Atherosclerosis 2015  . Cerebral atrophy 2015    . Cerebrovascular disease 2015    Small vessel disease  . Right radial fracture 2014  . Right foot drop 2014  . Unstable gait  2014  . Cardiomegaly 2015  . Fracture of right superior pubic ramus ([place]) 2011    Superior and inferior pubic rami fractures    Past Surgical History  Procedure Laterality Date  . Hip fracture surgery    . Abdominal hysterectomy    . Elbow surgery    . Cataract extraction Bilateral   . Cardiac catheterization  08/08/2012    2.5x6mm Emerge balloon predilation, 2.75x37mm VeriFLEX bare-metal stent was inserted into the RCA to cover proximal to mid segments of RCA deployed with Noncompliant Sprinter balloon 2.75x13mm up to 2.33mm dilated  . Orif wrist fracture Right 07/03/2013    Procedure: OPEN REDUCTION INTERNAL FIXATION (ORIF) RIGHT WRIST FRACTURE;  Surgeon: Linna Hoff, MD;  Location: [place];  Service: Orthopedics;  Laterality: Right;  . Intramedullary (im) nail intertrochanteric Left 11/18/2013    Procedure: IM NAIL  LEFT HIP;  Surgeon: Rozanna Box, MD;  Location: [place];  Service: Orthopedics;  Laterality: Left;  . Left heart catheterization with coronary angiogram N/A 08/08/2012    Procedure: LEFT HEART CATHETERIZATION WITH CORONARY ANGIOGRAM;  Surgeon: Troy Sine, MD;  Location[facility] C[facility] LAB;  Service: Cardiovascular;  Laterality: N/A;  . Percutaneous coronary stent intervention (pci-s) N/A 08/08/2012    Procedure: PERCUTANEOUS CORONARY STENT INTERVENTION (PCI-S);  Surgeon: Troy Sine, MD;  Location: [facility] CATH LAB;  Service: Cardiovascular;  Laterality: N/A;    Allergies  Allergen Reactions  . Aspirin Other (See Comments)    taking xarelto  . Codeine Nausea And Vomiting  . Aspirin Other (See Comments)    Pt taking xarelto   . Benadryl [Diphenhydramine] Other (See Comments)    unknown  . Codeine Other (See Comments)    unknown  . Pneumococcal Vaccines Other (See Comments)    unknown  . Benadryl [Diphenhydramine Hcl] Other (See Comments)     unknown  . Flu Virus Vaccine Other (See Comments)    unknown  . Penicillins Rash  . Penicillins Rash  . Pneumococcal Vaccines Other (See Comments)    unknown      Medication List       This list is accurate as of: 07/11/15 11:59 PM.  Always use your most recent med list.               acetaminophen 325 MG tablet  Commonly known as:  TYLENOL  Take 650 mg by mouth 3 (three) times daily.     ALLERGY EYE DROPS 0.025 % ophthalmic solution  Generic drug:  ketotifen  Place 1 drop into both eyes daily as needed (allergies).     busPIRone 30 MG tablet  Commonly known as:  BUSPAR  Take 30 mg by mouth 2 (two) times daily.     diltiazem 180 MG 24 hr capsule  Commonly known as:  CARDIZEM CD  Take 1 capsule (180 mg total) by mouth daily after lunch.     doxazosin 8 MG tablet  Commonly known as:  CARDURA  Take 8 mg by mouth every morning.     doxycycline 100 MG tablet  Commonly known as:  VIBRA-TABS  Take 1 tablet (100 mg total) by mouth 2 (two) times daily.     fluticasone 27.5 MCG/SPRAY nasal spray  Commonly known as:  VERAMYST  Place 2 sprays into the nose daily.     hydrOXYzine 25 MG tablet  Commonly known as:  ATARAX/VISTARIL  Take 25 mg by mouth 2 (two) times daily.     hydrOXYzine 25 MG tablet  Commonly known as:  ATARAX/VISTARIL  Take 1 tablet (25 mg total) by mouth every 8 (eight) hours as needed.     lisinopril 40 MG tablet  Commonly known as:  PRINIVIL,ZESTRIL  Take 40 mg by mouth every morning.     loperamide 2 MG tablet  Commonly known as:  IMODIUM A-D  Take 2 mg by mouth. Take 2 mg four times daily as needed for diarrhea or loose stools     loratadine 10 MG tablet  Commonly known as:  CLARITIN  Take 10 mg by mouth daily as needed for allergies.     LORazepam 1 MG tablet  Commonly known as:  ATIVAN  Take one tablet by mouth at lunch for anxiety     LORazepam 1 MG tablet  Commonly known as:  ATIVAN  Take 0.5 tablets (0.5 mg total) by mouth every 6  (six) hours as needed for anxiety.     Melatonin 3 MG Tabs  Take 1 tablet by mouth at bedtime.     mirabegron ER 25 MG Tb24 tablet  Commonly known as:  MYRBETRIQ  Take 25 mg by mouth daily.     nitroGLYCERIN 0.4 MG SL tablet  Commonly known as:  NITROSTAT  Place 1 tablet (0.4 mg total) under the tongue every 5 (five) minutes x 3 doses as needed for chest pain.     oxyCODONE-acetaminophen 5-325 MG tablet  Commonly known as:  PERCOCET/ROXICET  Take 1-2 tablets by mouth every 4 (four) hours as needed for severe pain.     potassium chloride 10 MEQ tablet  Commonly known as:  K-DUR  Take 10 mEq by mouth daily.     sertraline 100 MG tablet  Commonly known as:  ZOLOFT  Take 150 mg by mouth daily.     temazepam 15 MG capsule  Commonly known as:  RESTORIL  Take 15 mg by mouth at bedtime.        Physical Exam: Filed Vitals:   07/11/15 1542  BP: 116/69  Pulse: 58  Temp: 98.4 F (36.9 C)  Resp: 16  Height: 5\' 3"  (1.6 m)  Weight: 101 lb 6.4 oz (45.995 kg)  SpO2: 96%   Body mass index is 17.97 kg/(m^2). Physical Exam  Cardiovascular: Normal rate, regular rhythm and normal heart sounds.   Pulmonary/Chest: Effort normal and breath sounds normal.  Abdominal: Soft. Bowel sounds are normal.  Musculoskeletal: She exhibits edema and tenderness.  Right arm  Neurological:  Intermittent lethargy/waxing and waning mentation; asks what to do next over and over  Skin:  Ecchymoses of right shoulder and hand, severe swelling of right hand; open area right posterior arm;  right upper arm is deformed and clearly moving separately from distal arm when brace removed for dressing change  Psychiatric:  See neuro    Labs reviewed: Basic Metabolic Panel:  Recent Labs  03/24/15 06/27/15 2354 06/28/15 0600  NA 140 139 139  K 3.8 3.5 3.6  CL  --  107 107  CO2  --  19* 24  GLUCOSE  --  169* 120*  BUN 12 14 14   CREATININE 0.8 0.93 0.90  CALCIUM  --  8.7* 8.6*   Liver Function  Tests:  Recent Labs  06/28/15 0600  AST 18  ALT 13*  ALKPHOS 77  BILITOT 0.6  PROT 5.2*  ALBUMIN 3.2*   No results for input(s): LIPASE, AMYLASE in the last 8760 hours. No results for input(s): AMMONIA in the last 8760 hours. CBC:  Recent Labs  06/27/15 2354 06/28/15 0600 07/04/15  WBC 11.7* 9.8 8.2  NEUTROABS 10.2*  --   --   HGB 12.4 11.1* 8.6*  HCT 38.4 33.7* 26*  MCV 97.0 96.8  --   PLT 183 180 266   Lipid Panel: No results for input(s): CHOL, HDL, LDLCALC, TRIG, CHOLHDL, LDLDIRECT in the last 8760 hours. Lab Results  Component Value Date   HGBA1C 6.1* 08/10/2012    Assessment/Plan 1. Fracture, humerus, open, right, with malunion, subsequent encounter -supervisor called surgeon on call and advised to put brace on upper arm and keep it on -she should f/u tomorrow in ortho clinic  2. Hematoma -slowly resolving on forehead -off eliquis  3. Subacute delirium -multifactorial with pain, pain meds, anxiety meds, baseline cognitive impairment, sleeping pills -need to work to stabilize her regimen to improve her mentation and pain control  Labs/tests ordered:  F/u with ortho tomorrow, but injury appears unchanged to me  Oza Oberle L. Raeley Gilmore, D.O. [facility] [address], [place] [address] Cell Phone (Mon-Fri 8am-5pm):  [phone] On Call:  [phone] & follow prompts after 5pm & weekends Office Phone:  [phone] Office Fax:  [phone]

## 2015-07-13 ENCOUNTER — Other Ambulatory Visit: Payer: Self-pay | Admitting: *Deleted

## 2015-07-13 MED ORDER — OXYCODONE-ACETAMINOPHEN 5-325 MG PO TABS: 1.0000 | ORAL_TABLET | ORAL | Status: DC | PRN

## 2015-07-23 ENCOUNTER — Encounter: Payer: Self-pay | Admitting: Internal Medicine

## 2015-08-11 ENCOUNTER — Non-Acute Institutional Stay (SKILLED_NURSING_FACILITY): Payer: Medicare Other | Admitting: Adult Health

## 2015-08-11 DIAGNOSIS — I2699 Other pulmonary embolism without acute cor pulmonale: Secondary | ICD-10-CM | POA: Diagnosis not present

## 2015-08-11 DIAGNOSIS — H9193 Unspecified hearing loss, bilateral: Secondary | ICD-10-CM

## 2015-08-11 DIAGNOSIS — S42351B Displaced comminuted fracture of shaft of humerus, right arm, initial encounter for open fracture: Secondary | ICD-10-CM

## 2015-08-11 DIAGNOSIS — D649 Anemia, unspecified: Secondary | ICD-10-CM | POA: Diagnosis not present

## 2015-08-11 DIAGNOSIS — I10 Essential (primary) hypertension: Secondary | ICD-10-CM | POA: Diagnosis not present

## 2015-08-11 DIAGNOSIS — Z7189 Other specified counseling: Secondary | ICD-10-CM

## 2015-08-15 LAB — CBC AND DIFFERENTIAL
HCT: 37 % (ref 36–46)
Hemoglobin: 11.9 g/dL — AB (ref 12.0–16.0)
Platelets: 255 10*3/uL (ref 150–399)
WBC: 6.8 10^3/mL

## 2015-08-15 LAB — BASIC METABOLIC PANEL
BUN: 17 mg/dL (ref 4–21)
Creatinine: 0.6 mg/dL (ref 0.5–1.1)
Glucose: 80 mg/dL
Potassium: 3.7 mmol/L (ref 3.4–5.3)
Sodium: 141 mmol/L (ref 137–147)

## 2015-08-15 LAB — HEPATIC FUNCTION PANEL
ALT: 12 U/L (ref 7–35)
AST: 17 U/L (ref 13–35)
Alkaline Phosphatase: 114 U/L (ref 25–125)
Bilirubin, Total: 0.5 mg/dL

## 2015-08-16 ENCOUNTER — Encounter: Payer: Self-pay | Admitting: Adult Health

## 2015-08-16 DIAGNOSIS — H9190 Unspecified hearing loss, unspecified ear: Secondary | ICD-10-CM | POA: Insufficient documentation

## 2015-08-16 NOTE — Progress Notes (Signed)
Patient ID: Cathy Wallace, female   DOB: August 31, 1916, 82 y.o.   MRN: [id number]    Nursing Home Location:  [place]   Code Status: DNR  [name], DO as PCP - General (Geriatric Medicine) [facility]  Goals of care: Advanced Directive information Advanced Directives 07/04/2015  Does patient have an advance directive? Yes  Type of Paramedic of [place];Living will;Out of facility DNR (pink MOST or yellow form)  Does patient want to make changes to advanced directive? No - Patient declined  Copy of advanced directive(s) in chart? Yes  Pre-existing out of facility DNR order (yellow form or pink MOST form) Yellow form placed in chart (order not valid for inpatient use)     Place of Service: SNF (31)  Chief Complaint  Patient presents with  . Medical Management of Chronic Issues    HPI:  82 y.o. female, residing at [facility], skilled care section. I am here to review his/her chronic medical issues.  VS have been stable over the past month. Weight has remained stable at   I am here to review her chronic medical issues. She has a hx of STEMI, HTN, DVT/PE recurrent (off xarelto due to falls and acute blood loss anemia), depression, anxiety, right foot drop, OP, memory loss, and GERD. The staff reports that she has a decreased appetite, and has lost 3 lbs in the past month. She is receiving boost as a supplement. She has reported to her caregiver that she is ready to die.  She has requested that we not intervene if she becomes acutely ill. She has a DNR noted on the chart.  Her family would like to meet with myself or Dr. Mariea Clonts to discuss her care. She continues to have anxiety surrounding her caregiver schedule and routines through out the day. She has become more forgetful over time and asks the same questions over and over again. Followed by Dr. Casimiro Needle with pysch for this  issue.  Resident request to see the audiologist due to poor hearing. She would like to be able to communicate more with the staff and myself about her care but finds this difficult.  BP has been WNL on lisinopril, Cardiazem, and Cardura. Anemia improved from 8.6 on 1/3 to 9.4 on 1/10 after discontinuing the xarelto.      Review of Systems:  Review of Systems  Constitutional: Positive for activity change and appetite change. Negative for fever, chills, diaphoresis and fatigue.  HENT: Negative for congestion.   Respiratory: Negative for cough and shortness of breath.   Cardiovascular: Negative for chest pain, palpitations and leg swelling.  Genitourinary: Negative for dysuria.  Musculoskeletal: Positive for arthralgias and gait problem.  Skin: Positive for wound. Negative for rash.  Neurological: Negative for dizziness, syncope, facial asymmetry, speech difficulty, weakness and numbness.  Psychiatric/Behavioral: Positive for behavioral problems, confusion, dysphoric mood and agitation. Negative for suicidal ideas, hallucinations, sleep disturbance and self-injury. The patient is nervous/anxious. The patient is not hyperactive.     Medications: Patient's Medications  New Prescriptions   No medications on file  Previous Medications   ACETAMINOPHEN (TYLENOL) 325 MG TABLET    Take 650 mg by mouth 3 (three) times daily.   BUSPIRONE (BUSPAR) 30 MG TABLET    Take 30 mg by mouth 2 (two) times daily.   DILTIAZEM (CARDIZEM CD) 180 MG 24 HR CAPSULE    Take 1 capsule (180 mg total) by mouth daily  after lunch.   DOXAZOSIN (CARDURA) 8 MG TABLET    Take 8 mg by mouth every morning.   FLUTICASONE (VERAMYST) 27.5 MCG/SPRAY NASAL SPRAY    Place 2 sprays into the nose daily.   HYDROXYZINE (ATARAX/VISTARIL) 25 MG TABLET    Take 25 mg by mouth 2 (two) times daily.   HYDROXYZINE (ATARAX/VISTARIL) 25 MG TABLET    Take 1 tablet (25 mg total) by mouth every 8 (eight) hours as needed.   KETOTIFEN (ALLERGY EYE  DROPS) 0.025 % OPHTHALMIC SOLUTION    Place 1 drop into both eyes daily as needed (allergies).   LISINOPRIL (PRINIVIL,ZESTRIL) 40 MG TABLET    Take 40 mg by mouth every morning.   LOPERAMIDE (IMODIUM A-D) 2 MG TABLET    Take 2 mg by mouth. Take 2 mg four times daily as needed for diarrhea or loose stools   LORATADINE (CLARITIN) 10 MG TABLET    Take 10 mg by mouth daily as needed for allergies.    LORAZEPAM (ATIVAN) 1 MG TABLET    Take one tablet by mouth at lunch for anxiety   LORAZEPAM (ATIVAN) 1 MG TABLET    Take 0.5 tablets (0.5 mg total) by mouth every 6 (six) hours as needed for anxiety.   MELATONIN 3 MG TABS    Take 1 tablet by mouth at bedtime.   MIRABEGRON ER (MYRBETRIQ) 25 MG TB24 TABLET    Take 25 mg by mouth daily.   NITROGLYCERIN (NITROSTAT) 0.4 MG SL TABLET    Place 1 tablet (0.4 mg total) under the tongue every 5 (five) minutes x 3 doses as needed for chest pain.   OXYCODONE-ACETAMINOPHEN (PERCOCET/ROXICET) 5-325 MG TABLET    Take 1-2 tablets by mouth every 4 (four) hours as needed for severe pain.   POTASSIUM CHLORIDE (K-DUR) 10 MEQ TABLET    Take 10 mEq by mouth daily.   SERTRALINE (ZOLOFT) 100 MG TABLET    Take 150 mg by mouth daily.   TEMAZEPAM (RESTORIL) 15 MG CAPSULE    Take 15 mg by mouth at bedtime.  Modified Medications   No medications on file  Discontinued Medications   DOXYCYCLINE (VIBRA-TABS) 100 MG TABLET    Take 1 tablet (100 mg total) by mouth 2 (two) times daily.     Physical Exam:  Filed Vitals:   08/16/15 1129  BP: 113/68  Pulse: 69  Temp: 97.9 F (36.6 C)  Resp: 17  Weight: 98 lb 9.6 oz (44.725 kg)    Physical Exam  Constitutional: She is oriented to person, place, and time. No distress.  Thin, frail  HENT:  Significant bruising to forehead and orbital area (improved)  Eyes: Pupils are equal, round, and reactive to light.  Neck: No JVD present.  Cardiovascular: Normal rate and regular rhythm.   No murmur heard. No edema  Pulmonary/Chest:  Effort normal and breath sounds normal. No respiratory distress.  Abdominal: Soft. Bowel sounds are normal. She exhibits no distension.  Musculoskeletal:  Right arm in a support sling, normal sensation and circulation, edema significantly improved  Neurological: She is oriented to person, place, and time.  Skin: Skin is warm and dry. She is not diaphoretic.  1x1cm open area to sacrum, 80% yellow tissue, 20% red.  No tenderness, no drainage  Psychiatric:  Anxious, ask the same question over and over    Wt Readings from Last 3 Encounters:  08/16/15 98 lb 9.6 oz (44.725 kg)  07/11/15 101 lb 6.4 oz (45.995 kg)  07/04/15  101 lb (45.813 kg)     Labs reviewed/Significant Diagnostic Results:  Basic Metabolic Panel:  Recent Labs  03/24/15 06/27/15 2354 06/28/15 0600  NA 140 139 139  K 3.8 3.5 3.6  CL  --  107 107  CO2  --  19* 24  GLUCOSE  --  169* 120*  BUN 12 14 14   CREATININE 0.8 0.93 0.90  CALCIUM  --  8.7* 8.6*   Liver Function Tests:  Recent Labs  06/28/15 0600  AST 18  ALT 13*  ALKPHOS 77  BILITOT 0.6  PROT 5.2*  ALBUMIN 3.2*   No results for input(s): LIPASE, AMYLASE in the last 8760 hours. No results for input(s): AMMONIA in the last 8760 hours. CBC:  Recent Labs  06/27/15 2354 06/28/15 0600 07/04/15 07/11/15  WBC 11.7* 9.8 8.2 8.5  NEUTROABS 10.2*  --   --   --   HGB 12.4 11.1* 8.6* 9.4*  HCT 38.4 33.7* 26* 28*  MCV 97.0 96.8  --   --   PLT 183 180 266  --    CBG:  Recent Labs  06/27/15 2328 06/28/15 0805  GLUCAP 168* 121*   TSH: No results for input(s): TSH in the last 8760 hours. A1C: Lab Results  Component Value Date   HGBA1C 6.1* 08/10/2012   Lipid Panel: No results for input(s): CHOL, HDL, LDLCALC, TRIG, CHOLHDL, LDLDIRECT in the last 8760 hours.     Assessment/Plan  1. Right comminuted open humerus fracture, subsequent encounter -healing with less pain per the resident -continues to use percocet 1-2 times a day -followed  by ortho with a wrist cock splint ordered -continue OT, improved edema with therapy  2. Other pulmonary embolism without acute cor pulmonale, unspecified chronicity (HCC) -has a hx of this but no new events -xarelto discontinued indefinitely due to falls and anemia -given her goals of care and overall poor quality of life, would not resume  3. Anemia, unspecified anemia type -stabilized with discontinuing xarelto -not on iron therapy -recheck CBC  4. Essential hypertension -controlled, continue current meds  5. Advanced care planning/counseling discussion -Resident disclosed that she is ready to die and does not want [place] to intervene if she becomes acutely ill. She remains a DNR and would benefit from a review of a MOST form. I had difficulty communicating with her, as her hearing has worsened over time. Her family would like to meet next week, which would be a good time review the most form and discuss her goals of care further. I let the resident know that we are not "keeping her alive" with medication, as she was concerned about this. Our goal is to provide her with comfort and a good quality of life.  6. HOH -worsening over time -resident requested to see the audiologist about her hearing aids     Cindi Carbon, [place] [phone]

## 2015-08-22 ENCOUNTER — Other Ambulatory Visit: Payer: Self-pay | Admitting: Internal Medicine

## 2015-08-22 ENCOUNTER — Encounter: Payer: Self-pay | Admitting: Internal Medicine

## 2015-08-22 ENCOUNTER — Non-Acute Institutional Stay (SKILLED_NURSING_FACILITY): Payer: Medicare Other | Admitting: Internal Medicine

## 2015-08-22 DIAGNOSIS — Z7189 Other specified counseling: Secondary | ICD-10-CM

## 2015-08-22 DIAGNOSIS — G4700 Insomnia, unspecified: Secondary | ICD-10-CM | POA: Diagnosis not present

## 2015-08-22 DIAGNOSIS — F411 Generalized anxiety disorder: Secondary | ICD-10-CM | POA: Diagnosis not present

## 2015-08-22 DIAGNOSIS — I10 Essential (primary) hypertension: Secondary | ICD-10-CM | POA: Diagnosis not present

## 2015-08-22 DIAGNOSIS — S42351G Displaced comminuted fracture of shaft of humerus, right arm, subsequent encounter for fracture with delayed healing: Secondary | ICD-10-CM

## 2015-08-22 DIAGNOSIS — I482 Chronic atrial fibrillation, unspecified: Secondary | ICD-10-CM

## 2015-08-22 DIAGNOSIS — I2699 Other pulmonary embolism without acute cor pulmonale: Secondary | ICD-10-CM | POA: Diagnosis not present

## 2015-08-22 DIAGNOSIS — H9193 Unspecified hearing loss, bilateral: Secondary | ICD-10-CM

## 2015-08-22 DIAGNOSIS — D62 Acute posthemorrhagic anemia: Secondary | ICD-10-CM

## 2015-08-22 NOTE — Progress Notes (Signed)
Patient ID: Cathy Wallace, female   DOB: [birth year], 82 y.o.   MRN: [id number]  Location:  [place] Room Number: 108 Place of Service:  SNF (31) Provider:  Almon Whitford L. Mariea Clonts, D.O., C.M.D.  Hollace Kinnier, DO  [name], DO as PCP - General (Geriatric Medicine) [facility]  Extended Emergency Contact Information Primary Emergency Contact: Nadel,Beverly Address: [place] [address] Johnnette Litter of [place] Phone: [phone] Work Phone: [phone] Mobile Phone: [phone] Relation: Daughter Secondary Emergency Contact: Bilski,Larry Address: [address]          [place], [place] [address] Johnnette Litter of [place] Phone: [phone] Work Phone: [phone] Relation: Other  Code Status:  DNR Goals of care: Advanced Directive information Advanced Directives 07/04/2015  Does patient have an advance directive? Yes  Type of Paramedic of [place];Living will;Out of facility DNR (pink MOST or yellow form)  Does patient want to make changes to advanced directive? No - Patient declined  Copy of advanced directive(s) in chart? Yes  Pre-existing out of facility DNR order (yellow form or pink MOST form) Yellow form placed in chart (order not valid for inpatient use)  MOST updated:  DNR, Do not hospitalize, do not transfer to rehab, no antibiotics, no iv fluids, no tube feeding, meds only for comfort (pain, anxiety, depression); family discussing hospice, but not as of yet due to still eating fairly well   Chief Complaint  Patient presents with  . Acute Visit    discuss comfort measures with children, complete MOST form    HPI:  Pt is a 82 y.o. female seen today for an acute visit due to concerns about her decline, desires to die, worsening mentation and anxiety.  She is repeating herself more often, feeling anxious and forgetting more.  I  met with Fritz Pickerel, her son, Judeen Hammans, her daughter in law and Bev, her daughter (by phone).  They are quite clearly in agreement with allowing Mrs. Harkin to be comfortable.  We updated her MOST form.  They all agree that she does not want her life prolonged and they want to grant that wish.  We decided on discontinuing all meds and services/testing that would be life-prolonging.  We will continue to treat her anxiety, depression and pain for maximum pain control and to keep her still as interactive as possible.  She and her family have previously decided on do not hospitalize status and now they do not want to move her to rehab at all either.  They no longer want antibiotics given b/c these will prolong her life.  Also no IVs, tube feeding and certainly DNR code status.   Past Medical History  Diagnosis Date  . Heart attack ([place]) "two years ago"  . CAD (coronary artery disease)   . HLD (hyperlipidemia)   . PE (pulmonary embolism)   . DVT (deep vein thrombosis) in pregnancy   . Left foot drop   . Pacemaker   . Breast cancer ([place])   . DVT (deep venous thrombosis) ([place]) 2011     Eliquis anticoagulation  . Pulmonary embolism [facility]) Jan 2012    Eliquis anticoagulation  . HTN (hypertension)   . Spinal stenosis   . Bradycardia, on admit 08/09/2012  . Acute MI ([place]) 08/10/2012    2D Echo - EF 55-60%, normal  . Depression   . GERD (gastroesophageal reflux disease)   .  Cancer (HCC)     hx of breast cancer  . Arthritis   . Closed pelvic fracture (HCC) 11/11/2012  . Alcohol dependence (HCC) 11/15/2012  . Long term (current) use of anticoagulants 11/15/2012  . Sacral fracture, closed (HCC) 11/15/2012    Bilateral nondisplaced sacral fractures.   . L5 vertebral fracture (HCC) 11/15/2012    Fracture of the L5 left transverse process.    . Anemia 11/15/2012  . B12 deficiency 12/01/2012  . Insomnia 12/01/2012  . Unspecified vitamin D deficiency 12/07/2012  . Anxiety 2013  . Osteoarthritis 02/10/2013  . Lumbar  vertebral fracture (HCC) 06/15/2013    L3-4 12/15/20143  . Anginal pain (HCC) 2014  . Hyperlipemia 2014  . History of fall 2014  . Intertrochanteric fracture of left hip (HCC)  11/16/48  . Coronary artery disease 2014  . Urinary tract infection 2015  . Constipation 2015  . Atherosclerosis 2015  . Cerebral atrophy 2015  . Cerebrovascular disease 2015    Small vessel disease  . Right radial fracture 2014  . Right foot drop 2014  . Unstable gait  2014  . Cardiomegaly 2015  . Fracture of right superior pubic ramus (HCC) 2011    Superior and inferior pubic rami fractures   Past Surgical History  Procedure Laterality Date  . Hip fracture surgery    . Abdominal hysterectomy    . Elbow surgery    . Cataract extraction Bilateral   . Cardiac catheterization  08/08/2012    2.5x60mm Emerge balloon predilation, 2.75x14mm VeriFLEX bare-metal stent was inserted into the RCA to cover proximal to mid segments of RCA deployed with Noncompliant Sprinter balloon 2.75x65mm up to 2.13mm dilated  . Orif wrist fracture Right 07/03/2013    Procedure: OPEN REDUCTION INTERNAL FIXATION (ORIF) RIGHT WRIST FRACTURE;  Surgeon: Sharma Covert, MD;  Location: [facility] OR;  Service: Orthopedics;  Laterality: Right;  . Intramedullary (im) nail intertrochanteric Left 11/18/2013    Procedure: IM NAIL  LEFT HIP;  Surgeon: Budd Palmer, MD;  Location: [facility] OR;  Service: Orthopedics;  Laterality: Left;  . Left heart catheterization with coronary angiogram N/A 08/08/2012    Procedure: LEFT HEART CATHETERIZATION WITH CORONARY ANGIOGRAM;  Surgeon: Lennette Bihari, MD;  Location: [facility] CATH LAB;  Service: Cardiovascular;  Laterality: N/A;  . Percutaneous coronary stent intervention (pci-s) N/A 08/08/2012    Procedure: PERCUTANEOUS CORONARY STENT INTERVENTION (PCI-S);  Surgeon: Lennette Bihari, MD;  Location: [facility] CATH LAB;  Service: Cardiovascular;  Laterality: N/A;    Allergies  Allergen Reactions  . Aspirin Other (See Comments)    taking  xarelto  . Codeine Nausea And Vomiting  . Aspirin Other (See Comments)    Pt taking xarelto   . Benadryl [Diphenhydramine] Other (See Comments)    unknown  . Codeine Other (See Comments)    unknown  . Pneumococcal Vaccines Other (See Comments)    unknown  . Benadryl [Diphenhydramine Hcl] Other (See Comments)    unknown  . Flu Virus Vaccine Other (See Comments)    unknown  . Penicillins Rash  . Penicillins Rash  . Pneumococcal Vaccines Other (See Comments)    unknown      Medication List       This list is accurate as of: 08/22/15  1:33 PM.  Always use your most recent med list.               acetaminophen 325 MG tablet  Commonly known as:  TYLENOL  Take 650 mg  by mouth 3 (three) times daily.     ALLERGY EYE DROPS 0.025 % ophthalmic solution  Generic drug:  ketotifen  Place 1 drop into both eyes daily as needed (allergies).     busPIRone 30 MG tablet  Commonly known as:  BUSPAR  Take 30 mg by mouth 2 (two) times daily.     diltiazem 180 MG 24 hr capsule  Commonly known as:  CARDIZEM CD  Take 1 capsule (180 mg total) by mouth daily after lunch.     doxazosin 8 MG tablet  Commonly known as:  CARDURA  Take 8 mg by mouth every morning.     fluticasone 27.5 MCG/SPRAY nasal spray  Commonly known as:  VERAMYST  Place 2 sprays into the nose daily.     hydrOXYzine 25 MG tablet  Commonly known as:  ATARAX/VISTARIL  Take 25 mg by mouth 2 (two) times daily.     hydrOXYzine 25 MG tablet  Commonly known as:  ATARAX/VISTARIL  Take 1 tablet (25 mg total) by mouth every 8 (eight) hours as needed.     lisinopril 40 MG tablet  Commonly known as:  PRINIVIL,ZESTRIL  Take 40 mg by mouth every morning.     loperamide 2 MG tablet  Commonly known as:  IMODIUM A-D  Take 2 mg by mouth. Take 2 mg four times daily as needed for diarrhea or loose stools     loratadine 10 MG tablet  Commonly known as:  CLARITIN  Take 10 mg by mouth daily as needed for allergies.     LORazepam  1 MG tablet  Commonly known as:  ATIVAN  Take one tablet by mouth at lunch for anxiety     LORazepam 1 MG tablet  Commonly known as:  ATIVAN  Take 0.5 tablets (0.5 mg total) by mouth every 6 (six) hours as needed for anxiety.     Melatonin 3 MG Tabs  Take 1 tablet by mouth at bedtime.     mirabegron ER 25 MG Tb24 tablet  Commonly known as:  MYRBETRIQ  Take 25 mg by mouth daily.     nitroGLYCERIN 0.4 MG SL tablet  Commonly known as:  NITROSTAT  Place 1 tablet (0.4 mg total) under the tongue every 5 (five) minutes x 3 doses as needed for chest pain.     oxyCODONE-acetaminophen 5-325 MG tablet  Commonly known as:  PERCOCET/ROXICET  Take 1-2 tablets by mouth every 4 (four) hours as needed for severe pain.     potassium chloride 10 MEQ tablet  Commonly known as:  K-DUR  Take 10 mEq by mouth daily.     sertraline 100 MG tablet  Commonly known as:  ZOLOFT  Take 150 mg by mouth daily.     temazepam 15 MG capsule  Commonly known as:  RESTORIL  Take 15 mg by mouth at bedtime.        Review of Systems  Constitutional: Positive for activity change, appetite change and fatigue. Negative for fever and chills.       Intake remains fairly good, but her taste buds have changed and she is recently saying she doesn't like many items she previously ate  HENT: Positive for hearing loss. Negative for facial swelling.        Seeing audiology tomorrow--family wants to keep this appt so she can hear better  Eyes: Negative for visual disturbance.  Respiratory: Negative for chest tightness and shortness of breath.   Cardiovascular: Negative for chest pain and leg  swelling.  Gastrointestinal: Negative for abdominal pain and abdominal distention.  Genitourinary: Positive for urgency. Negative for difficulty urinating.       Urinary incontinence  Musculoskeletal: Positive for arthralgias and gait problem.       Right upper arm pain is gradually improving as is swelling of right hand; wears brace  and sling; no longer ambulatory  Skin: Positive for wound.       Bruising of face improved, mild remains around eyes; right posterior arm w/o signs of infection  Neurological: Positive for weakness and numbness. Negative for dizziness.  Psychiatric/Behavioral: Positive for confusion, sleep disturbance and decreased concentration. Negative for suicidal ideas. The patient is nervous/anxious.        Has expressed desire to die     There is no immunization history on file for this patient. Pertinent  Health Maintenance Due  Topic Date Due  . DEXA SCAN  02/26/1984  . PNA vac Low Risk Adult (1 of 2 - PCV13) 02/26/1984  . INFLUENZA VACCINE  01/30/2016   Fall Risk  03/23/2015 12/15/2014  Falls in the past year? Yes No  Number falls in past yr: 1 -  Risk for fall due to : History of fall(s) Impaired balance/gait   Functional Status Survey: Is the patient deaf or have difficulty hearing?: Yes Does the patient have difficulty seeing, even when wearing glasses/contacts?: No Does the patient have difficulty concentrating, remembering, or making decisions?: Yes Does the patient have difficulty walking or climbing stairs?: Yes Does the patient have difficulty dressing or bathing?: Yes  Filed Vitals:   08/22/15 1331  BP: 114/66  Pulse: 65  Temp: 98.4 F (36.9 C)  Resp: 17  Height: '5\' 3"'$  (1.6 m)  Weight: 98 lb 9.6 oz (44.725 kg)  SpO2: 94%   Body mass index is 17.47 kg/(m^2). Physical Exam  Constitutional:  Frail white female seated in recliner in her room  Cardiovascular: Intact distal pulses.   irreg irreg  Pulmonary/Chest: Effort normal and breath sounds normal.  Abdominal: Bowel sounds are normal. She exhibits no distension.  Musculoskeletal:  Right upper arm tender; wearing her sling and brace  Neurological: She is alert.  Confused, repeating herself often, pleasant  Skin: Skin is warm and dry.  Ecchymoses slowly healing  Psychiatric:  Anxious; poor judgment in terms of trying  to get up    Labs reviewed:  Recent Labs  03/24/15 06/27/15 2354 06/28/15 0600  NA 140 139 139  K 3.8 3.5 3.6  CL  --  107 107  CO2  --  19* 24  GLUCOSE  --  169* 120*  BUN '12 14 14  '$ CREATININE 0.8 0.93 0.90  CALCIUM  --  8.7* 8.6*    Recent Labs  06/28/15 0600  AST 18  ALT 13*  ALKPHOS 77  BILITOT 0.6  PROT 5.2*  ALBUMIN 3.2*    Recent Labs  06/27/15 2354 06/28/15 0600 07/04/15 07/11/15  WBC 11.7* 9.8 8.2 8.5  NEUTROABS 10.2*  --   --   --   HGB 12.4 11.1* 8.6* 9.4*  HCT 38.4 33.7* 26* 28*  MCV 97.0 96.8  --   --   PLT 183 180 266  --    Lab Results  Component Value Date   TSH 2.082 08/10/2012   Lab Results  Component Value Date   HGBA1C 6.1* 08/10/2012   Lab Results  Component Value Date   CHOL  07/26/2010    191  ATP III CLASSIFICATION:  <200     mg/dL   Desirable  200-239  mg/dL   Borderline High  >=240    mg/dL   High          HDL 93 07/26/2010   LDLCALC  07/26/2010    83        Total Cholesterol/HDL:CHD Risk Coronary Heart Disease Risk Table                     Men   Women  1/2 Average Risk   3.4   3.3  Average Risk       5.0   4.4  2 X Average Risk   9.6   7.1  3 X Average Risk  23.4   11.0        Use the calculated Patient Ratio above and the CHD Risk Table to determine the patient's CHD Risk.        ATP III CLASSIFICATION (LDL):  <100     mg/dL   Optimal  100-129  mg/dL   Near or Above                    Optimal  130-159  mg/dL   Borderline  160-189  mg/dL   High  >190     mg/dL   Very High   TRIG 77 07/26/2010   CHOLHDL 2.1 07/26/2010    Assessment/Plan 1. Comminuted fracture of right humerus, with delayed healing, subsequent encounter -this has interfered considerably in her qol--she's unable to hold a book to read now -pain under improved control over time--cont tylenol, percocet for this as well as braces, sling which help keep fracture secure  2. Advanced care planning/counseling discussion -in addition to  the medical mgt portion today (spent 25 mins), I spent 30 mins meeting with the [place] family, updating goals of care including MOST form and making med changes -see MOST for details and above - they are discussing hospice, but seemed hesitant due to their mom's good po intake at this point (wt 98.6 which is down from 101 a month ago), still getting boost in am also--she is more picky than she once was with what she will eat  3. Other pulmonary embolism without acute cor pulmonale, unspecified chronicity (HCC) -off xarelto due to severe drop in hgb -hgb is trending upward now -would not restart xarelto for any reason at this stage  4. Essential hypertension -bp is well controlled, but maintaining these meds is life prolonging so d/c lisinopril, diltiazem, cardura -decrease vitals to q month from q week  5. Acute blood loss anemia -hgb improving gradually off blood thinners, stop checking due to goals of care  6. Chronic atrial fibrillation (HCC) -off xarelto, rate controlled with diltiazem--discussed risks of stopping diltiazem, but goal is comfort not to prolong life so family agreed to stopping it also  7. HOH (hard of hearing), bilateral -pt and family want to go to audiologist to get hearing improved so qol is better--keep tomorrow's appt  8. Generalized anxiety disorder -cont current regimen as per Dr. Casimiro Needle considering goals of care--would not dose reduce or taper meds--goal is good qol, comfort  9. Insomnia -cont temazepam and melatonin for sleep for comfort reasons   Family/ staff Communication: as above, spoke with children today and discussed with snf nurse and nurse manager  Labs/tests ordered:  No further labs/imaging   Adilynn Bessey L. Gearldean Lomanto, D.O. [facility] [phone]  [place], [place] [address] Cell Phone (Mon-Fri 8am-5pm):  [phone] On Call:  [phone] & follow prompts after 5pm & weekends Office Phone:   [phone] Office Fax:  [phone]

## 2015-08-28 ENCOUNTER — Non-Acute Institutional Stay (SKILLED_NURSING_FACILITY): Payer: Medicare Other | Admitting: Adult Health

## 2015-08-28 DIAGNOSIS — S42351G Displaced comminuted fracture of shaft of humerus, right arm, subsequent encounter for fracture with delayed healing: Secondary | ICD-10-CM

## 2015-08-28 DIAGNOSIS — M21331 Wrist drop, right wrist: Secondary | ICD-10-CM | POA: Diagnosis not present

## 2015-08-31 ENCOUNTER — Other Ambulatory Visit: Payer: Self-pay

## 2015-08-31 MED ORDER — OXYCODONE-ACETAMINOPHEN 5-325 MG PO TABS: 1.0000 | ORAL_TABLET | ORAL | Status: DC | PRN

## 2015-08-31 NOTE — Telephone Encounter (Signed)
Refill request for Oxycod/Apap 5-325 mg tablets.   Prescription printed and placed in Dr. Cyndi Lennert folder for signing.

## 2015-09-05 NOTE — Progress Notes (Signed)
Patient ID: Cathy Wallace, female   DOB: [birth year], 82 y.o.   MRN: [id number]  Location:  [place]:  SNF (31) Provider:  Royal Hawthorn NP  Hollace Kinnier, DO  [name], DO as PCP - General (Geriatric Medicine) [facility]  Extended Emergency Contact Information Primary Emergency Contact: Nadel,Beverly Address: [place] [address] Johnnette Litter of [place] Phone: [phone] Work Phone: [phone] Mobile Phone: [phone] Relation: Daughter Secondary Emergency Contact: Stamper,Larry Address: [address]          [place], [place] [address] Johnnette Litter of [place] Phone: [phone] Work Phone: [phone] Relation: Other  Code Status:  DNR comfort care Goals of care: Advanced Directive information Advanced Directives 07/04/2015  Does patient have an advance directive? Yes  Type of Paramedic of [place];Living will;Out of facility DNR (pink MOST or yellow form)  Does patient want to make changes to advanced directive? No - Patient declined  Copy of advanced directive(s) in chart? Yes  Pre-existing out of facility DNR order (yellow form or pink MOST form) Yellow form placed in chart (order not valid for inpatient use)     Chief Complaint  Patient presents with  . Acute Visit    ? concerning brace, resident wants arm checked    HPI:  Pt is a 82 y.o. female seen today for an acute visit due to questions concerning her arm brace. She fell and subsequently had an open fracture to the right humerus on 12/27.  She was followed by ortho but this has been discontinued as her current care plan is to provide comfort measures only. Her quality of life has significantly declined because she can no longer read due to wrist drop in the right arm. She asked to see me to assess her arm for healing and ask if her skin under the brace  was intact. She denies pain or numbness to the right arm. The staff would like orders to check the skin integrity under the brace.  She is currently in a hard brace and sling to the right arm.   Past Medical History  Diagnosis Date  . Heart attack ([place]) "two years ago"  . CAD (coronary artery disease)   . HLD (hyperlipidemia)   . PE (pulmonary embolism)   . DVT (deep vein thrombosis) in pregnancy   . Left foot drop   . Pacemaker   . Breast cancer ([place])   . DVT (deep venous thrombosis) ([place]) 2011     Eliquis anticoagulation  . Pulmonary embolism [facility]) Jan 2012    Eliquis anticoagulation  . HTN (hypertension)   . Spinal stenosis   . Bradycardia, on admit 08/09/2012  . Acute MI ([place]) 08/10/2012    2D Echo - EF 55-60%, normal  . Depression   . GERD (gastroesophageal reflux disease)   . Cancer (HCC)     hx of breast cancer  . Arthritis   . Closed pelvic fracture ([place]) 11/11/2012  . Alcohol dependence ([place]) 11/15/2012  . Long term (current) use of anticoagulants 11/15/2012  . Sacral fracture, closed ([place]) 11/15/2012    Bilateral nondisplaced sacral fractures.   . L5 vertebral fracture ([place]) 11/15/2012    Fracture of the L5 left transverse process.    . Anemia 11/15/2012  . B12 deficiency 12/01/2012  . Insomnia 12/01/2012  . Unspecified vitamin D deficiency 12/07/2012  .  Anxiety 2013  . Osteoarthritis 02/10/2013  . Lumbar vertebral fracture (HCC) 06/15/2013    L3-4 12/15/20143  . Anginal pain ([place]) 2014  . Hyperlipemia 2014  . History of fall 2014  . Intertrochanteric fracture of left hip ([place])  11/16/48  . Coronary artery disease 2014  . Urinary tract infection 2015  . Constipation 2015  . Atherosclerosis 2015  . Cerebral atrophy 2015  . Cerebrovascular disease 2015    Small vessel disease  . Right radial fracture 2014  . Right foot drop 2014  . Unstable gait  2014  . Cardiomegaly 2015  . Fracture of right superior pubic ramus ([place]) 2011    Superior and inferior pubic rami  fractures   Past Surgical History  Procedure Laterality Date  . Hip fracture surgery    . Abdominal hysterectomy    . Elbow surgery    . Cataract extraction Bilateral   . Cardiac catheterization  08/08/2012    2.5x34mm Emerge balloon predilation, 2.75x39mm VeriFLEX bare-metal stent was inserted into the RCA to cover proximal to mid segments of RCA deployed with Noncompliant Sprinter balloon 2.75x34mm up to 2.50mm dilated  . Orif wrist fracture Right 07/03/2013    Procedure: OPEN REDUCTION INTERNAL FIXATION (ORIF) RIGHT WRIST FRACTURE;  Surgeon: Linna Hoff, MD;  Location: [place];  Service: Orthopedics;  Laterality: Right;  . Intramedullary (im) nail intertrochanteric Left 11/18/2013    Procedure: IM NAIL  LEFT HIP;  Surgeon: Rozanna Box, MD;  Location: [place];  Service: Orthopedics;  Laterality: Left;  . Left heart catheterization with coronary angiogram N/A 08/08/2012    Procedure: LEFT HEART CATHETERIZATION WITH CORONARY ANGIOGRAM;  Surgeon: Troy Sine, MD;  Location: [facility] CATH LAB;  Service: Cardiovascular;  Laterality: N/A;  . Percutaneous coronary stent intervention (pci-s) N/A 08/08/2012    Procedure: PERCUTANEOUS CORONARY STENT INTERVENTION (PCI-S);  Surgeon: Troy Sine, MD;  Location: [facility] CATH LAB;  Service: Cardiovascular;  Laterality: N/A;    Allergies  Allergen Reactions  . Aspirin Other (See Comments)    taking xarelto  . Codeine Nausea And Vomiting  . Aspirin Other (See Comments)    Pt taking xarelto   . Benadryl [Diphenhydramine] Other (See Comments)    unknown  . Codeine Other (See Comments)    unknown  . Pneumococcal Vaccines Other (See Comments)    unknown  . Benadryl [Diphenhydramine Hcl] Other (See Comments)    unknown  . Flu Virus Vaccine Other (See Comments)    unknown  . Penicillins Rash  . Penicillins Rash  . Pneumococcal Vaccines Other (See Comments)    unknown     Review of Systems  Constitutional: Positive for activity change.  Respiratory:  Negative for cough and shortness of breath.   Gastrointestinal: Negative for abdominal pain and abdominal distention.  Musculoskeletal: Positive for arthralgias and gait problem.  Neurological: Positive for weakness. Negative for dizziness and facial asymmetry.  Psychiatric/Behavioral: Positive for confusion. The patient is nervous/anxious.      There is no immunization history on file for this patient. Pertinent  Health Maintenance Due  Topic Date Due  . DEXA SCAN  02/26/1984  . PNA vac Low Risk Adult (1 of 2 - PCV13) 02/26/1984  . INFLUENZA VACCINE  01/30/2016   Fall Risk  03/23/2015 12/15/2014  Falls in the past year? Yes No  Number falls in past yr: 1 -  Risk for fall due to : History of fall(s) Impaired balance/gait   Functional Status Survey:  There were no vitals filed for this visit. There is no weight on file to calculate BMI. Physical Exam  Constitutional: No distress.  Cardiovascular: Normal rate and regular rhythm.   Pulmonary/Chest: Effort normal and breath sounds normal.  Abdominal: Soft. Bowel sounds are normal.  Musculoskeletal: She exhibits tenderness. She exhibits no edema.  Significant decrease in edema to the right arm, medial aspect of the upper arm with deformity and mild tenderness but no open areas or edema. Wrist drop noted to the right. Normal radial pulse and sensation  Neurological: She is alert.  Oriented x 2, repeats herself  Skin: Skin is warm and dry. She is not diaphoretic.    Labs reviewed:  Recent Labs  03/24/15 06/27/15 2354 06/28/15 0600  NA 140 139 139  K 3.8 3.5 3.6  CL  --  107 107  CO2  --  19* 24  GLUCOSE  --  169* 120*  BUN 12 14 14   CREATININE 0.8 0.93 0.90  CALCIUM  --  8.7* 8.6*    Recent Labs  06/28/15 0600  AST 18  ALT 13*  ALKPHOS 77  BILITOT 0.6  PROT 5.2*  ALBUMIN 3.2*    Recent Labs  06/27/15 2354 06/28/15 0600 07/04/15 07/11/15  WBC 11.7* 9.8 8.2 8.5  NEUTROABS 10.2*  --   --   --   HGB 12.4  11.1* 8.6* 9.4*  HCT 38.4 33.7* 26* 28*  MCV 97.0 96.8  --   --   PLT 183 180 266  --    Lab Results  Component Value Date   TSH 2.082 08/10/2012   Lab Results  Component Value Date   HGBA1C 6.1* 08/10/2012   Lab Results  Component Value Date   CHOL  07/26/2010    191        ATP III CLASSIFICATION:  <200     mg/dL   Desirable  200-239  mg/dL   Borderline High  >=240    mg/dL   High          HDL 93 07/26/2010   LDLCALC  07/26/2010    83        Total Cholesterol/HDL:CHD Risk Coronary Heart Disease Risk Table                     Men   Women  1/2 Average Risk   3.4   3.3  Average Risk       5.0   4.4  2 X Average Risk   9.6   7.1  3 X Average Risk  23.4   11.0        Use the calculated Patient Ratio above and the CHD Risk Table to determine the patient's CHD Risk.        ATP III CLASSIFICATION (LDL):  <100     mg/dL   Optimal  100-129  mg/dL   Near or Above                    Optimal  130-159  mg/dL   Borderline  160-189  mg/dL   High  >190     mg/dL   Very High   TRIG 77 07/26/2010   CHOLHDL 2.1 07/26/2010    Significant Diagnostic Results in last 30 days:  No results found.  Assessment/Plan 1. Comminuted fracture of right humerus, with delayed healing, subsequent encounter -continues with brace for comfort, denies pain -placed on comfort care per resident/family wishes -staff should check  the skin under the brace three times weekly to monitor for skin breakdown as the brace is hard and has limited flexibility -no further imaging per her care plan so I can not say if the bone is healing but there continues to be a noted deformity to the humerus at the medial portion of the upper arm -I let the resident know there is nothing else that can be done to promote healing other than to wear the brace, this seemed to relieve her anxiety  2. Wrist drop, acquired, right -noted due to disuse -she has worked with OT, given her goals of care, no further orders were  written    Family/ staff Communication: discussed with nurse  Labs/tests ordered:  none  Cindi Carbon, [place] [phone]

## 2015-10-09 ENCOUNTER — Non-Acute Institutional Stay (SKILLED_NURSING_FACILITY): Payer: Medicare Other | Admitting: Adult Health

## 2015-10-09 DIAGNOSIS — F411 Generalized anxiety disorder: Secondary | ICD-10-CM | POA: Diagnosis not present

## 2015-10-09 DIAGNOSIS — R413 Other amnesia: Secondary | ICD-10-CM

## 2015-10-09 DIAGNOSIS — G4700 Insomnia, unspecified: Secondary | ICD-10-CM | POA: Diagnosis not present

## 2015-10-09 DIAGNOSIS — S42351G Displaced comminuted fracture of shaft of humerus, right arm, subsequent encounter for fracture with delayed healing: Secondary | ICD-10-CM | POA: Diagnosis not present

## 2015-10-09 DIAGNOSIS — I10 Essential (primary) hypertension: Secondary | ICD-10-CM

## 2015-10-09 NOTE — Progress Notes (Signed)
Patient ID: Cathy Wallace, female   DOB: 1917-03-17, 82 y.o.   MRN: [id number]  Location:  [place]:  SNF (31) Provider:   Cindi Carbon, ANP [place] [phone]   REED, Jonelle Sidle, DO  [name], [name],[name],[name], [name]  Code Status:  DNR Goals of care: Advanced Directive information Advanced Directives 07/04/2015  Does patient have an advance directive? Yes  Type of Paramedic of [place];Living will;Out of facility DNR (pink MOST or yellow form)  Does patient want to make changes to advanced directive? No - Patient declined  Copy of advanced directive(s) in chart? Yes  Pre-existing out of facility DNR order (yellow form or pink MOST form) Yellow form placed in chart (order not valid for inpatient use)     Chief Complaint  Patient presents with  . Medical Management of Chronic Issues    HPI:  Pt is a 82 y.o. female seen today for medical management of chronic diseases. Resident seen by Dr. Mariea Clonts and taken off several medicines and placed on comfort care due to family request due to depression, anxiety, non healing fracture, and memory loss.   1. Essential hypertension She was taken off BP meds on 2/21 by Dr. Mariea Clonts per family wishes. Her BP has been stable since she has stopped taking hypertensive medications. Her current weight is 95 lb. She has lost 3 lb in the past 3  months.   2. Comminuted fracture of right humerus, with delayed healing, subsequent encounter -happened after a fall in December. She wears a brace to her R upper arm and uses a sling. She is taking Percocet for pain prn . She has taken two tablets 1-2x/day on most days. She denies pain today.   3. Generalized anxiety disorder Her anxiety has not gotten any worse per nursing staff. She states she is eating well. Her son visits her often. She is taking Buspar and Ativan scheduled and prn for anxiety. She has used the ativan 3x for the past 2 weeks.   4. Memory loss Her memory loss has worsened over time. There is no recent MMSE for review. Not currently on meds.  5. Insomnia She states she is sleeping well. She takes Restoril for sleep.   Past Medical History  Diagnosis Date  . Heart attack ([place]) "two years ago"  . CAD (coronary artery disease)   . HLD (hyperlipidemia)   . PE (pulmonary embolism)   . DVT (deep vein thrombosis) in pregnancy   . Left foot drop   . Pacemaker   . Breast cancer ([place])   . DVT (deep venous thrombosis) ([place]) 2011     Eliquis anticoagulation  . Pulmonary embolism [facility]) Jan 2012    Eliquis anticoagulation  . HTN (hypertension)   . Spinal stenosis   . Bradycardia, on admit 08/09/2012  . Acute MI ([place]) 08/10/2012    2D Echo - EF 55-60%, normal  . Depression   . GERD (gastroesophageal reflux  disease)   . Cancer (HCC)     hx of breast cancer  . Arthritis   . Closed pelvic fracture ([place]) 11/11/2012  . Alcohol dependence ([place]) 11/15/2012  . Long term (current) use of anticoagulants 11/15/2012  . Sacral fracture, closed ([place]) 11/15/2012    Bilateral nondisplaced sacral fractures.   . L5 vertebral fracture ([place]) 11/15/2012    Fracture of the L5 left transverse process.    . Anemia 11/15/2012  . B12 deficiency 12/01/2012  . Insomnia 12/01/2012  . Unspecified vitamin D deficiency 12/07/2012  . Anxiety 2013  . Osteoarthritis 02/10/2013  . Lumbar vertebral fracture (HCC)  06/15/2013    L3-4 12/15/20143  . Anginal pain ([place]) 2014  . Hyperlipemia 2014  . History of fall 2014  . Intertrochanteric fracture of left hip ([place])  11/16/48  . Coronary artery disease 2014  . Urinary tract infection 2015  . Constipation 2015  . Atherosclerosis 2015  . Cerebral atrophy 2015  . Cerebrovascular disease 2015    Small vessel disease  . Right radial fracture 2014  . Right foot drop 2014  . Unstable gait  2014  . Cardiomegaly 2015  . Fracture of right superior pubic ramus ([place]) 2011    Superior and inferior pubic rami fractures   Past Surgical History  Procedure Laterality Date  . Hip fracture surgery    . Abdominal hysterectomy    . Elbow surgery    . Cataract extraction Bilateral   . Cardiac catheterization  08/08/2012    2.5x83mm Emerge balloon predilation, 2.75x10mm VeriFLEX bare-metal stent was inserted into the RCA to cover proximal to mid segments of RCA deployed with Noncompliant Sprinter balloon 2.75x15mm up to 2.57mm dilated  . Orif wrist fracture Right 07/03/2013    Procedure: OPEN REDUCTION INTERNAL FIXATION (ORIF) RIGHT WRIST FRACTURE;  Surgeon: Linna Hoff, MD;  Location: [place];  Service: Orthopedics;  Laterality: Right;  . Intramedullary (im) nail intertrochanteric Left 11/18/2013    Procedure: IM NAIL  LEFT HIP;  Surgeon: Rozanna Box, MD;  Location: [place];  Service: Orthopedics;  Laterality: Left;  . Left heart catheterization with coronary angiogram N/A 08/08/2012    Procedure: LEFT HEART CATHETERIZATION WITH CORONARY ANGIOGRAM;  Surgeon: Troy Sine, MD;  Location[facility] CATH LAB;  Service: Cardiovascular;  Laterality: N/A;  . Percutaneous coronary stent intervention (pci-s) N/A 08/08/2012    Procedure: PERCUTANEOUS CORONARY STENT INTERVENTION (PCI-S);  Surgeon: Troy Sine, MD;  Location: [facility] CATH LAB;  Service: Cardiovascular;  Laterality: N/A;    Allergies  Allergen Reactions  . Aspirin Other (See Comments)    taking xarelto  . Codeine  Nausea And Vomiting  . Aspirin Other (See Comments)    Pt taking xarelto   . Benadryl [Diphenhydramine] Other (See Comments)    unknown  . Codeine Other (See Comments)    unknown  . Pneumococcal Vaccines Other (See Comments)    unknown  . Benadryl [Diphenhydramine Hcl] Other (See Comments)    unknown  . Flu Virus Vaccine Other (See Comments)    unknown  . Penicillins Rash  . Penicillins Rash  . Pneumococcal Vaccines Other (See Comments)    unknown      Medication List       This list is accurate as of: 10/09/15  2:41 PM.  Always use your most recent med list.               acetaminophen 325 MG tablet  Commonly known as:  TYLENOL  Take 650 mg by mouth 3 (three) times daily.     busPIRone 30 MG tablet  Commonly known as:  BUSPAR  Take 30 mg by mouth 2 (two) times daily.     fluticasone 27.5 MCG/SPRAY nasal spray  Commonly known as:  VERAMYST  Place 2 sprays into the nose daily.     hydrOXYzine 25 MG tablet  Commonly known as:  ATARAX/VISTARIL  Take 25 mg by mouth 2 (two) times daily.     hydrOXYzine 25 MG tablet  Commonly known as:  ATARAX/VISTARIL  Take 1 tablet (25 mg total) by mouth every 8 (eight) hours as needed.     loperamide 2 MG tablet  Commonly known as:  IMODIUM A-D  Take 2 mg by mouth. Take 2 mg four times daily as needed for diarrhea or loose stools     LORazepam 1 MG tablet  Commonly known as:  ATIVAN  Take one tablet by mouth at lunch for anxiety     LORazepam 1 MG tablet  Commonly known as:  ATIVAN  Take 0.5 tablets (0.5 mg total) by mouth every 6 (six) hours as needed for anxiety.     Melatonin 3 MG Tabs  Take 1 tablet by mouth at bedtime.     nitroGLYCERIN 0.4 MG SL tablet  Commonly known as:  NITROSTAT  Place 1 tablet (0.4 mg total) under the tongue every 5 (five) minutes x 3 doses as needed for chest pain.     oxyCODONE-acetaminophen 5-325 MG tablet  Commonly known as:  PERCOCET/ROXICET  Take 1-2 tablets by mouth every 4 (four)  hours as needed for severe pain.     sertraline 100 MG tablet  Commonly known as:  ZOLOFT  Take 150 mg by mouth daily.     temazepam 15 MG capsule  Commonly known as:  RESTORIL  Take 15 mg by mouth at bedtime.        Review of Systems  Constitutional: Positive for activity change (She is unable to read books anymore due to her R wrist drop). Negative for chills, diaphoresis, appetite change, fatigue and unexpected weight change.  HENT: Positive for hearing loss.   Respiratory: Negative for cough, chest tightness and shortness of breath.   Cardiovascular: Negative for chest pain, palpitations and leg swelling.  Gastrointestinal: Negative for nausea, vomiting, abdominal pain, diarrhea and constipation.  Genitourinary: Negative for dysuria, flank pain and difficulty urinating.  Musculoskeletal: Positive for arthralgias (Pain to R upper arm s/p fracture). Negative for myalgias, neck pain and neck stiffness.  Skin: Negative for rash and wound.  Neurological: Negative for dizziness, speech difficulty, weakness and headaches.  Psychiatric/Behavioral: Positive for confusion. Negative for sleep disturbance and agitation. The patient is nervous/anxious.      There is no immunization history on file for this patient. Pertinent  Health Maintenance Due  Topic Date Due  . DEXA SCAN  02/26/1984  . PNA vac Low Risk Adult (1 of 2 - PCV13) 02/26/1984  . INFLUENZA VACCINE  01/30/2016   Fall Risk  03/23/2015 12/15/2014  Falls in the past year? Yes No  Number falls in past yr: 1 -  Risk for fall due to : History of fall(s) Impaired balance/gait   Functional Status Survey: Hoyer lift   Wt Readings from Last 3 Encounters:  10/09/15 95 lb (43.092 kg)  08/22/15 98 lb 9.6 oz (44.725 kg)  08/16/15 98 lb 9.6 oz (44.725 kg)    Filed Vitals:   10/09/15 1438  Weight: 95 lb (  43.092 kg)   Body mass index is 16.83 kg/(m^2). Physical Exam  Constitutional: No distress.  HENT:  Head: Normocephalic  and atraumatic.  Mouth/Throat: Oropharynx is clear and moist.  Eyes: Conjunctivae and EOM are normal. Pupils are equal, round, and reactive to light. Right eye exhibits no discharge. Left eye exhibits no discharge.  Neck: Normal range of motion. Neck supple. No JVD present. No thyromegaly present.  Cardiovascular: Normal rate, regular rhythm, normal heart sounds and intact distal pulses.  Exam reveals no friction rub.   No murmur heard. Pulmonary/Chest: Effort normal and breath sounds normal. No respiratory distress. She has no wheezes.  Abdominal: Soft. Bowel sounds are normal. She exhibits no distension. There is no tenderness. There is no guarding.  Musculoskeletal: She exhibits tenderness (R upper arm). She exhibits no edema.  Wrist drop on the right, foot drop on the right.  Lymphadenopathy:    She has no cervical adenopathy.  Neurological: She is alert.  Skin: Skin is warm and dry. No rash noted. She is not diaphoretic. No erythema.  Psychiatric: She has a normal mood and affect. Thought content normal.  Nursing note and vitals reviewed.   Labs reviewed:  Recent Labs  03/24/15 06/27/15 2354 06/28/15 0600  NA 140 139 139  K 3.8 3.5 3.6  CL  --  107 107  CO2  --  19* 24  GLUCOSE  --  169* 120*  BUN 12 14 14   CREATININE 0.8 0.93 0.90  CALCIUM  --  8.7* 8.6*    Recent Labs  06/28/15 0600  AST 18  ALT 13*  ALKPHOS 77  BILITOT 0.6  PROT 5.2*  ALBUMIN 3.2*    Recent Labs  06/27/15 2354 06/28/15 0600 07/04/15 07/11/15  WBC 11.7* 9.8 8.2 8.5  NEUTROABS 10.2*  --   --   --   HGB 12.4 11.1* 8.6* 9.4*  HCT 38.4 33.7* 26* 28*  MCV 97.0 96.8  --   --   PLT 183 180 266  --    Lab Results  Component Value Date   TSH 2.082 08/10/2012   Lab Results  Component Value Date   HGBA1C 6.1* 08/10/2012   Lab Results  Component Value Date   CHOL  07/26/2010    191        ATP III CLASSIFICATION:  <200     mg/dL   Desirable  200-239  mg/dL   Borderline High  >=240     mg/dL   High          HDL 93 07/26/2010   LDLCALC  07/26/2010    83        Total Cholesterol/HDL:CHD Risk Coronary Heart Disease Risk Table                     Men   Women  1/2 Average Risk   3.4   3.3  Average Risk       5.0   4.4  2 X Average Risk   9.6   7.1  3 X Average Risk  23.4   11.0        Use the calculated Patient Ratio above and the CHD Risk Table to determine the patient's CHD Risk.        ATP III CLASSIFICATION (LDL):  <100     mg/dL   Optimal  100-129  mg/dL   Near or Above  Optimal  130-159  mg/dL   Borderline  160-189  mg/dL   High  >190     mg/dL   Very High   TRIG 77 07/26/2010   CHOLHDL 2.1 07/26/2010    Significant Diagnostic Results in last 30 days:  No results found.  Assessment/Plan  1. Essential hypertension Stable.  BP meds d/c'ed per family requests.  Further lab draws d/c'ed per family requests.  Family wants comfort care only.   2. Comminuted fracture of right humerus, with delayed healing, subsequent encounter -no further ortho follow ups -continue percocet prn Continue wearing brace to R upper arm and sling.  3. Generalized anxiety disorder Stable but she continues to have periods of anxiety regarding her daily routine and care. Continue Buspar and Ativan.  4. Memory loss Worsening.  She is not taking memory medication due to goals of care  5. Insomnia -Controlled.  -continue restoril   Labs/tests ordered:  No labs   Cindi Carbon, [place] [phone]

## 2015-10-19 ENCOUNTER — Non-Acute Institutional Stay (SKILLED_NURSING_FACILITY): Payer: Medicare Other | Admitting: Adult Health

## 2015-10-19 ENCOUNTER — Encounter: Payer: Self-pay | Admitting: Adult Health

## 2015-10-19 DIAGNOSIS — R413 Other amnesia: Secondary | ICD-10-CM | POA: Diagnosis not present

## 2015-10-19 DIAGNOSIS — F411 Generalized anxiety disorder: Secondary | ICD-10-CM

## 2015-10-19 NOTE — Progress Notes (Signed)
Patient ID: Cathy Wallace, female   DOB: [birth year], 82 y.o.   MRN: [id number]  Location:  [place]:  SNF (31) Provider:   Cindi Carbon, ANP [place] [phone]   REED, Jonelle Sidle, DO  [name], DO as PCP - General (Geriatric Medicine) Well [facility]  Extended Emergency Contact Information Primary Emergency Contact: Nadel,Beverly Address: [place] [address] Johnnette Litter of [place] Phone: [phone] Work Phone: [phone] Mobile Phone: [phone] Relation: Daughter Secondary Emergency Contact: Fiorenza,Larry Address: [address]          [place], [place] [address] Johnnette Litter of [place] Phone: [phone] Work Phone: [phone] Relation: Other  Code Status:  DNR Goals of care: Advanced Directive information Advanced Directives 07/04/2015  Does patient have an advance directive? Yes  Type of Paramedic of [place];Living will;Out of facility DNR (pink MOST or yellow form)  Does patient want to make changes to advanced directive? No - Patient declined  Copy of advanced directive(s) in chart? Yes  Pre-existing out of facility DNR order (yellow form or pink MOST form) Yellow form placed in chart (order not valid for inpatient use)     Chief Complaint  Patient presents with  . Acute Visit    anxiety    HPI:  Pt is a 82 y.o. female seen today for an acute visit for anxiety and memory loss. The staff reports there her daughter requested visit to see if there is anything we can do to help her frequent anxiety. This seems to come from her caregiver schedule, routines, etc. She has had progressive memory loss over the past year and requires frequent reminders and reorientation. She has difficulty with orientation and recall. She has been followed by psych and is currently on Zoloft, Restoril, and scheduled  ativan.   Past Medical History  Diagnosis Date  . Heart attack ([place]) "two years ago"  . CAD (coronary artery disease)   . HLD (hyperlipidemia)   . PE (pulmonary embolism)   . DVT (deep vein thrombosis) in pregnancy   . Left foot drop   . Pacemaker   . Breast cancer ([place])   . DVT (deep venous thrombosis) ([place]) 2011     Eliquis anticoagulation  . Pulmonary embolism [facility]) Jan 2012    Eliquis anticoagulation  . HTN (hypertension)   . Spinal stenosis   . Bradycardia, on admit 08/09/2012  . Acute MI ([place]) 08/10/2012    2D Echo - EF 55-60%, normal  . Depression   . GERD (gastroesophageal reflux disease)   . Cancer (HCC)     hx of breast cancer  . Arthritis   . Closed pelvic fracture ([place]) 11/11/2012  . Alcohol dependence ([place]) 11/15/2012  . Long term (current) use of anticoagulants 11/15/2012  . Sacral fracture, closed ([place]) 11/15/2012    Bilateral nondisplaced sacral fractures.   . L5 vertebral fracture ([place]) 11/15/2012    Fracture of the L5 left transverse process.    . Anemia 11/15/2012  . B12 deficiency 12/01/2012  . Insomnia 12/01/2012  . Unspecified vitamin D deficiency 12/07/2012  . Anxiety 2013  . Osteoarthritis 02/10/2013  . Lumbar vertebral fracture (HCC) 06/15/2013    L3-4 12/15/20143  . Anginal pain ([place]) 2014  . Hyperlipemia 2014  . History of fall 2014  . Intertrochanteric fracture of left hip ([place])  11/16/48  .  Coronary artery disease 2014  . Urinary tract infection 2015  . Constipation 2015  . Atherosclerosis 2015  . Cerebral atrophy 2015  . Cerebrovascular disease 2015    Small vessel disease  . Right radial fracture 2014  . Right foot drop 2014  . Unstable gait  2014  . Cardiomegaly 2015  . Fracture of right superior pubic ramus ([place]) 2011    Superior and inferior pubic rami fractures   Past Surgical History  Procedure Laterality Date  . Hip fracture surgery    . Abdominal hysterectomy    . Elbow surgery    . Cataract extraction Bilateral   . Cardiac  catheterization  08/08/2012    2.5x62mm Emerge balloon predilation, 2.75x35mm VeriFLEX bare-metal stent was inserted into the RCA to cover proximal to mid segments of RCA deployed with Noncompliant Sprinter balloon 2.75x67mm up to 2.65mm dilated  . Orif wrist fracture Right 07/03/2013    Procedure: OPEN REDUCTION INTERNAL FIXATION (ORIF) RIGHT WRIST FRACTURE;  Surgeon: Linna Hoff, MD;  Location: [place];  Service: Orthopedics;  Laterality: Right;  . Intramedullary (im) nail intertrochanteric Left 11/18/2013    Procedure: IM NAIL  LEFT HIP;  Surgeon: Rozanna Box, MD;  Location: [place];  Service: Orthopedics;  Laterality: Left;  . Left heart catheterization with coronary angiogram N/A 08/08/2012    Procedure: LEFT HEART CATHETERIZATION WITH CORONARY ANGIOGRAM;  Surgeon: Troy Sine, MD;  Location: [facility] CATH LAB;  Service: Cardiovascular;  Laterality: N/A;  . Percutaneous coronary stent intervention (pci-s) N/A 08/08/2012    Procedure: PERCUTANEOUS CORONARY STENT INTERVENTION (PCI-S);  Surgeon: Troy Sine, MD;  Location: [facility] C[facility];  Service: Cardiovascular;  Laterality: N/A;    Allergies  Allergen Reactions  . Aspirin Other (See Comments)    taking xarelto  . Codeine Nausea And Vomiting  . Aspirin Other (See Comments)    Pt taking xarelto   . Benadryl [Diphenhydramine] Other (See Comments)    unknown  . Codeine Other (See Comments)    unknown  . Pneumococcal Vaccines Other (See Comments)    unknown  . Benadryl [Diphenhydramine Hcl] Other (See Comments)    unknown  . Flu Virus Vaccine Other (See Comments)    unknown  . Penicillins Rash  . Penicillins Rash  . Pneumococcal Vaccines Other (See Comments)    unknown      Medication List       This list is accurate as of: 10/19/15  2:34 PM.  Always use your most recent med list.               acetaminophen 325 MG tablet  Commonly known as:  TYLENOL  Take 650 mg by mouth 3 (three) times daily.     busPIRone 30 MG tablet    Commonly known as:  BUSPAR  Take 30 mg by mouth 2 (two) times daily.     fluticasone 27.5 MCG/SPRAY nasal spray  Commonly known as:  VERAMYST  Place 2 sprays into the nose daily.     hydrOXYzine 25 MG tablet  Commonly known as:  ATARAX/VISTARIL  Take 25 mg by mouth 2 (two) times daily.     hydrOXYzine 25 MG tablet  Commonly known as:  ATARAX/VISTARIL  Take 1 tablet (25 mg total) by mouth every 8 (eight) hours as needed.     loperamide 2 MG tablet  Commonly known as:  IMODIUM A-D  Take 2 mg by mouth. Take 2 mg four times daily as needed for diarrhea  or loose stools     LORazepam 1 MG tablet  Commonly known as:  ATIVAN  Take one tablet by mouth at lunch for anxiety     LORazepam 1 MG tablet  Commonly known as:  ATIVAN  Take 0.5 tablets (0.5 mg total) by mouth every 6 (six) hours as needed for anxiety.     Melatonin 3 MG Tabs  Take 1 tablet by mouth at bedtime.     nitroGLYCERIN 0.4 MG SL tablet  Commonly known as:  NITROSTAT  Place 1 tablet (0.4 mg total) under the tongue every 5 (five) minutes x 3 doses as needed for chest pain.     oxyCODONE-acetaminophen 5-325 MG tablet  Commonly known as:  PERCOCET/ROXICET  Take 1-2 tablets by mouth every 4 (four) hours as needed for severe pain.     sertraline 100 MG tablet  Commonly known as:  ZOLOFT  Take 150 mg by mouth daily.     temazepam 15 MG capsule  Commonly known as:  RESTORIL  Take 15 mg by mouth at bedtime.        Review of Systems  Constitutional: Negative for activity change and appetite change.  HENT: Negative for congestion.   Respiratory: Negative for cough and shortness of breath.   Cardiovascular: Negative for chest pain, palpitations and leg swelling.  Gastrointestinal: Negative for abdominal distention.  Musculoskeletal: Positive for arthralgias and gait problem. Negative for back pain and joint swelling.  Psychiatric/Behavioral: Positive for behavioral problems, confusion and decreased concentration.  Negative for hallucinations, sleep disturbance, self-injury and dysphoric mood. The patient is nervous/anxious. The patient is not hyperactive.      There is no immunization history on file for this patient. Pertinent  Health Maintenance Due  Topic Date Due  . DEXA SCAN  02/26/1984  . PNA vac Low Risk Adult (1 of 2 - PCV13) 02/26/1984  . INFLUENZA VACCINE  01/30/2016   Fall Risk  03/23/2015 12/15/2014  Falls in the past year? Yes No  Number falls in past yr: 1 -  Risk for fall due to : History of fall(s) Impaired balance/gait   Functional Status Survey:    Filed Vitals:   10/19/15 1431  BP: 122/70  Pulse: 68  Temp: 98.5 F (36.9 C)  Resp: 18  SpO2: 94%   There is no weight on file to calculate BMI. Physical Exam  Constitutional: No distress.  Eyes: Conjunctivae are normal. Pupils are equal, round, and reactive to light. Right eye exhibits no discharge. Left eye exhibits no discharge.  Cardiovascular: Normal rate and regular rhythm.   No murmur heard. Pulmonary/Chest: Effort normal and breath sounds normal.  Abdominal: Soft. Bowel sounds are normal.  Neurological: She is alert.  Repeats herself, forgetful, difficulty following commands  Skin: Skin is warm and dry. She is not diaphoretic.  Psychiatric:  anxious    Labs reviewed:  Recent Labs  03/24/15 06/27/15 2354 06/28/15 0600  NA 140 139 139  K 3.8 3.5 3.6  CL  --  107 107  CO2  --  19* 24  GLUCOSE  --  169* 120*  BUN 12 14 14   CREATININE 0.8 0.93 0.90  CALCIUM  --  8.7* 8.6*    Recent Labs  06/28/15 0600  AST 18  ALT 13*  ALKPHOS 77  BILITOT 0.6  PROT 5.2*  ALBUMIN 3.2*    Recent Labs  06/27/15 2354 06/28/15 0600 07/04/15 07/11/15  WBC 11.7* 9.8 8.2 8.5  NEUTROABS 10.2*  --   --   --  HGB 12.4 11.1* 8.6* 9.4*  HCT 38.4 33.7* 26* 28*  MCV 97.0 96.8  --   --   PLT 183 180 266  --    Lab Results  Component Value Date   TSH 2.082 08/10/2012   Lab Results  Component Value Date    HGBA1C 6.1* 08/10/2012   Lab Results  Component Value Date   CHOL  07/26/2010    191        ATP III CLASSIFICATION:  <200     mg/dL   Desirable  200-239  mg/dL   Borderline High  >=240    mg/dL   High          HDL 93 07/26/2010   LDLCALC  07/26/2010    83        Total Cholesterol/HDL:CHD Risk Coronary Heart Disease Risk Table                     Men   Women  1/2 Average Risk   3.4   3.3  Average Risk       5.0   4.4  2 X Average Risk   9.6   7.1  3 X Average Risk  23.4   11.0        Use the calculated Patient Ratio above and the CHD Risk Table to determine the patient's CHD Risk.        ATP III CLASSIFICATION (LDL):  <100     mg/dL   Optimal  100-129  mg/dL   Near or Above                    Optimal  130-159  mg/dL   Borderline  160-189  mg/dL   High  >190     mg/dL   Very High   TRIG 77 07/26/2010   CHOLHDL 2.1 07/26/2010    Significant Diagnostic Results in last 30 days:  No results found.  Assessment/Plan 1. Generalized anxiety disorder -already on a complex regimen and followed psych, they are due to the facility on May 9th -she remains fixated on her schedule and seems to get more anxious over it, this is confounded by her progressive memory loss -I will add Ativan 0.5 mg at lunch, and continue the 1 mg BID -Continue zoloft and await input from psych  2. Memory loss -progressive, requires assistance for all ADL's and very forgetful of the details of her care -her goals of care are comfort so I will not add any additional meds -continue supportive care and frequent reorientation    Family/ staff Communication: discussed with nurse  Cindi Carbon, [place] [phone]

## 2015-11-03 ENCOUNTER — Non-Acute Institutional Stay (SKILLED_NURSING_FACILITY): Payer: Medicare Other | Admitting: Adult Health

## 2015-11-03 DIAGNOSIS — G4700 Insomnia, unspecified: Secondary | ICD-10-CM | POA: Diagnosis not present

## 2015-11-03 DIAGNOSIS — F0391 Unspecified dementia with behavioral disturbance: Secondary | ICD-10-CM | POA: Diagnosis not present

## 2015-11-03 DIAGNOSIS — R627 Adult failure to thrive: Secondary | ICD-10-CM | POA: Diagnosis not present

## 2015-11-03 DIAGNOSIS — S42301G Unspecified fracture of shaft of humerus, right arm, subsequent encounter for fracture with delayed healing: Secondary | ICD-10-CM

## 2015-11-03 DIAGNOSIS — F411 Generalized anxiety disorder: Secondary | ICD-10-CM

## 2015-11-03 DIAGNOSIS — M21371 Foot drop, right foot: Secondary | ICD-10-CM | POA: Diagnosis not present

## 2015-11-03 DIAGNOSIS — F03918 Unspecified dementia, unspecified severity, with other behavioral disturbance: Secondary | ICD-10-CM

## 2015-11-10 ENCOUNTER — Other Ambulatory Visit: Payer: Self-pay | Admitting: *Deleted

## 2015-11-10 MED ORDER — OXYCODONE-ACETAMINOPHEN 5-325 MG PO TABS: ORAL_TABLET | ORAL | Status: DC

## 2015-11-10 NOTE — Telephone Encounter (Signed)
[facility] 

## 2015-11-20 ENCOUNTER — Encounter: Payer: Self-pay | Admitting: Adult Health

## 2015-11-20 NOTE — Progress Notes (Signed)
Patient ID: Cathy Wallace, female   DOB: 1917-03-06, 82 y.o.   MRN: [id number]  Location:  [place]:  SNF (31) Provider:   Cindi Carbon, ANP [place] [phone]   REED, Jonelle Sidle, DO  [name], DO as PCP - General (Geriatric Medicine) Well [facility]  Extended Emergency Contact Information Primary Emergency Contact: Nadel,Beverly Address: [place] [address] Johnnette Litter of [place] Phone: [phone] Work Phone: [phone] Mobile Phone: [phone] Relation: Daughter Secondary Emergency Contact: Stout,Larry Address: [address]          [place], [place] [address] Johnnette Litter of [place] Phone: [phone] Work Phone: [phone] Relation: Other  Code Status:  DNR Goals of care: Advanced Directive information Advanced Directives 07/04/2015  Does patient have an advance directive? Yes  Type of Paramedic of [place];Living will;Out of facility DNR (pink MOST or yellow form)  Does patient want to make changes to advanced directive? No - Patient declined  Copy of advanced directive(s) in chart? Yes  Pre-existing out of facility DNR order (yellow form or pink MOST form) Yellow form placed in chart (order not valid for inpatient use)     Chief Complaint  Patient presents with  . Medical Management of Chronic Issues    HPI:  Pt is a 82 y.o. female seen today for management of chronic conditions. Her goals of care are comfort based due to her age, debility, and overall poor quality of life after a fall with subsequent humerus fracture and worsening dementia. She has had progressive memory loss over the past year and requires frequent reminders and reorientation. She has difficulty with orientation and recall. She has been followed by psych and is currently on Zoloft, Restoril, and scheduled ativan. She is  non ambulatory due to right foot drop. Currently she has caregivers during the day to help with her ADL's.    Functional status: hoyer lift, intermittently incontinent   Past Medical History  Diagnosis Date  . Heart attack ([place]) "two years ago"  . CAD (coronary artery disease)   . HLD (hyperlipidemia)   . PE (pulmonary embolism)   . DVT (deep vein thrombosis) in pregnancy   . Left foot drop   . Pacemaker   . Breast cancer ([place])   . DVT (deep venous thrombosis) ([place]) 2011     Eliquis anticoagulation  . Pulmonary embolism [facility]) Jan 2012    Eliquis anticoagulation  . HTN (hypertension)   . Spinal stenosis   . Bradycardia, on admit 08/09/2012  . Acute MI ([place]) 08/10/2012    2D Echo - EF 55-60%, normal  . Depression   . GERD (gastroesophageal reflux disease)   . Cancer (HCC)     hx of breast cancer  . Arthritis   . Closed pelvic fracture ([place]) 11/11/2012  . Alcohol dependence ([place]) 11/15/2012  . Long term (current) use of anticoagulants 11/15/2012  . Sacral fracture, closed ([place]) 11/15/2012    Bilateral nondisplaced sacral fractures.   . L5 vertebral fracture ([place]) 11/15/2012    Fracture of the L5 left transverse process.    . Anemia 11/15/2012  . B12 deficiency 12/01/2012  . Insomnia 12/01/2012  . Unspecified vitamin D deficiency 12/07/2012  . Anxiety 2013  . Osteoarthritis 02/10/2013  . Lumbar vertebral fracture (HCC) 06/15/2013    L3-4 12/15/20143  . Anginal pain ([place]) 2014  .  Hyperlipemia 2014  . History of fall 2014  . Intertrochanteric fracture of left hip ([employer])  11/16/48  . Coronary artery disease 2014  . Urinary tract infection 2015  . Constipation 2015  . Atherosclerosis 2015  . Cerebral atrophy 2015  . Cerebrovascular disease 2015    Small vessel disease  . Right radial fracture 2014  . Right foot drop 2014  . Unstable gait  2014  . Cardiomegaly 2015  . Fracture of right superior pubic ramus ([place]) 2011    Superior and inferior pubic rami fractures   Past Surgical  History  Procedure Laterality Date  . Hip fracture surgery    . Abdominal hysterectomy    . Elbow surgery    . Cataract extraction Bilateral   . Cardiac catheterization  08/08/2012    2.5x83mm Emerge balloon predilation, 2.75x37mm VeriFLEX bare-metal stent was inserted into the RCA to cover proximal to mid segments of RCA deployed with Noncompliant Sprinter balloon 2.75x5mm up to 2.80mm dilated  . Orif wrist fracture Right 07/03/2013    Procedure: OPEN REDUCTION INTERNAL FIXATION (ORIF) RIGHT WRIST FRACTURE;  Surgeon: Linna Hoff, MD;  Location: [place];  Service: Orthopedics;  Laterality: Right;  . Intramedullary (im) nail intertrochanteric Left 11/18/2013    Procedure: IM NAIL  LEFT HIP;  Surgeon: Rozanna Box, MD;  Location: [place];  Service: Orthopedics;  Laterality: Left;  . Left heart catheterization with coronary angiogram N/A 08/08/2012    Procedure: LEFT HEART CATHETERIZATION WITH CORONARY ANGIOGRAM;  Surgeon: Troy Sine, MD;  Location: [facility] CATH LAB;  Service: Cardiovascular;  Laterality: N/A;  . Percutaneous coronary stent intervention (pci-s) N/A 08/08/2012    Procedure: PERCUTANEOUS CORONARY STENT INTERVENTION (PCI-S);  Surgeon: Troy Sine, MD;  Location: [facility] CATH LAB;  Service: Cardiovascular;  Laterality: N/A;    Allergies  Allergen Reactions  . Aspirin Other (See Comments)    taking xarelto  . Codeine Nausea And Vomiting  . Aspirin Other (See Comments)    Pt taking xarelto   . Benadryl [Diphenhydramine] Other (See Comments)    unknown  . Codeine Other (See Comments)    unknown  . Pneumococcal Vaccines Other (See Comments)    unknown  . Benadryl [Diphenhydramine Hcl] Other (See Comments)    unknown  . Flu Virus Vaccine Other (See Comments)    unknown  . Penicillins Rash  . Penicillins Rash  . Pneumococcal Vaccines Other (See Comments)    unknown      Medication List       This list is accurate as of: 11/03/15 11:59 PM.  Always use your most recent med list.                 acetaminophen 325 MG tablet  Commonly known as:  TYLENOL  Take 650 mg by mouth 3 (three) times daily.     busPIRone 30 MG tablet  Commonly known as:  BUSPAR  Take 30 mg by mouth 2 (two) times daily.     fluticasone 27.5 MCG/SPRAY nasal spray  Commonly known as:  VERAMYST  Place 2 sprays into the nose daily.     hydrOXYzine 25 MG tablet  Commonly known as:  ATARAX/VISTARIL  Take 1 tablet (25 mg total) by mouth every 8 (eight) hours as needed.     loperamide 2 MG tablet  Commonly known as:  IMODIUM A-D  Take 2 mg by mouth. Take 2 mg four times daily as needed for diarrhea or loose stools  LORazepam 1 MG tablet  Commonly known as:  ATIVAN  Take one tablet by mouth at lunch for anxiety     LORazepam 1 MG tablet  Commonly known as:  ATIVAN  Take 0.5 tablets (0.5 mg total) by mouth every 6 (six) hours as needed for anxiety.     nitroGLYCERIN 0.4 MG SL tablet  Commonly known as:  NITROSTAT  Place 1 tablet (0.4 mg total) under the tongue every 5 (five) minutes x 3 doses as needed for chest pain.     oxyCODONE-acetaminophen 5-325 MG tablet  Commonly known as:  PERCOCET/ROXICET  Take 1-2 tablets by mouth every 4 (four) hours as needed for severe pain.     sertraline 100 MG tablet  Commonly known as:  ZOLOFT  Take 150 mg by mouth daily.     temazepam 15 MG capsule  Commonly known as:  RESTORIL  Take 15 mg by mouth at bedtime.        Review of Systems  Constitutional: Negative for activity change and appetite change.  HENT: Negative for congestion.   Respiratory: Negative for cough and shortness of breath.   Cardiovascular: Negative for chest pain, palpitations and leg swelling.  Gastrointestinal: Negative for abdominal distention.  Musculoskeletal: Positive for arthralgias and gait problem. Negative for back pain and joint swelling.  Psychiatric/Behavioral: Positive for behavioral problems, confusion and decreased concentration. Negative for  hallucinations, sleep disturbance, self-injury and dysphoric mood. The patient is nervous/anxious. The patient is not hyperactive.      There is no immunization history on file for this patient. Pertinent  Health Maintenance Due  Topic Date Due  . DEXA SCAN  02/26/1984  . PNA vac Low Risk Adult (1 of 2 - PCV13) 02/26/1984  . INFLUENZA VACCINE  01/30/2016   Fall Risk  03/23/2015 12/15/2014  Falls in the past year? Yes No  Number falls in past yr: 1 -  Risk for fall due to : History of fall(s) Impaired balance/gait   Functional Status Survey:    Filed Vitals:   11/20/15 0900  Weight: 97 lb (43.999 kg)   Body mass index is 17.19 kg/(m^2). Physical Exam  Constitutional: No distress.  Eyes: Conjunctivae are normal. Pupils are equal, round, and reactive to light. Right eye exhibits no discharge. Left eye exhibits no discharge.  Cardiovascular: Normal rate and regular rhythm.   No murmur heard. Pulmonary/Chest: Effort normal and breath sounds normal.  Abdominal: Soft. Bowel sounds are normal.  Musculoskeletal: She exhibits no edema or tenderness.  Right wrist drop  Neurological: She is alert.  Repeats herself, forgetful, difficulty following commands  Skin: Skin is warm and dry. She is not diaphoretic.  Psychiatric:  anxious    Labs reviewed:  Recent Labs  03/24/15 06/27/15 2354 06/28/15 0600  NA 140 139 139  K 3.8 3.5 3.6  CL  --  107 107  CO2  --  19* 24  GLUCOSE  --  169* 120*  BUN 12 14 14   CREATININE 0.8 0.93 0.90  CALCIUM  --  8.7* 8.6*    Recent Labs  06/28/15 0600  AST 18  ALT 13*  ALKPHOS 77  BILITOT 0.6  PROT 5.2*  ALBUMIN 3.2*    Recent Labs  06/27/15 2354 06/28/15 0600 07/04/15 07/11/15  WBC 11.7* 9.8 8.2 8.5  NEUTROABS 10.2*  --   --   --   HGB 12.4 11.1* 8.6* 9.4*  HCT 38.4 33.7* 26* 28*  MCV 97.0 96.8  --   --  PLT 183 180 266  --    Lab Results  Component Value Date   TSH 2.082 08/10/2012   Lab Results  Component Value Date    HGBA1C 6.1* 08/10/2012   Lab Results  Component Value Date   CHOL  07/26/2010    191        ATP III CLASSIFICATION:  <200     mg/dL   Desirable  200-239  mg/dL   Borderline High  >=240    mg/dL   High          HDL 93 07/26/2010   LDLCALC  07/26/2010    83        Total Cholesterol/HDL:CHD Risk Coronary Heart Disease Risk Table                     Men   Women  1/2 Average Risk   3.4   3.3  Average Risk       5.0   4.4  2 X Average Risk   9.6   7.1  3 X Average Risk  23.4   11.0        Use the calculated Patient Ratio above and the CHD Risk Table to determine the patient's CHD Risk.        ATP III CLASSIFICATION (LDL):  <100     mg/dL   Optimal  100-129  mg/dL   Near or Above                    Optimal  130-159  mg/dL   Borderline  160-189  mg/dL   High  >190     mg/dL   Very High   TRIG 77 07/26/2010   CHOLHDL 2.1 07/26/2010    Significant Diagnostic Results in last 30 days:  No results found.  Assessment/Plan  1. Dementia with behavioral disturbance Progressive, no meds as these would not benefit her at this point Continue comfort care and frequent re orientation  2. Generalized anxiety disorder -slight improvement with current regimen -await input from Dr. Casimiro Needle  3. FTT (failure to thrive) in adult -poor appetite and progressive dementia -monitor weight and provide nutritional invention as indicated  4. Insomnia Stable, continue restoril  5. Foot drop, right -wears a brace, denies pain  6. Humerus fracture, right, with delayed healing, subsequent encounter -continue percocet, wrist splint, and brace -no further ortho apts as resident is comfort care  Consider Hospice   Family/ staff Communication: discussed with nurse  Cindi Carbon, [place] [phone]

## 2015-12-04 ENCOUNTER — Non-Acute Institutional Stay (SKILLED_NURSING_FACILITY): Payer: Medicare Other | Admitting: Adult Health

## 2015-12-04 ENCOUNTER — Encounter: Payer: Self-pay | Admitting: Adult Health

## 2015-12-04 DIAGNOSIS — F0280 Dementia in other diseases classified elsewhere without behavioral disturbance: Secondary | ICD-10-CM

## 2015-12-04 DIAGNOSIS — F411 Generalized anxiety disorder: Secondary | ICD-10-CM

## 2015-12-04 DIAGNOSIS — M4800 Spinal stenosis, site unspecified: Secondary | ICD-10-CM

## 2015-12-04 DIAGNOSIS — S42291K Other displaced fracture of upper end of right humerus, subsequent encounter for fracture with nonunion: Secondary | ICD-10-CM

## 2015-12-04 DIAGNOSIS — K5900 Constipation, unspecified: Secondary | ICD-10-CM | POA: Insufficient documentation

## 2015-12-04 DIAGNOSIS — G4700 Insomnia, unspecified: Secondary | ICD-10-CM | POA: Diagnosis not present

## 2015-12-04 DIAGNOSIS — K5901 Slow transit constipation: Secondary | ICD-10-CM | POA: Diagnosis not present

## 2015-12-04 DIAGNOSIS — G301 Alzheimer's disease with late onset: Secondary | ICD-10-CM

## 2015-12-04 NOTE — Progress Notes (Signed)
Patient ID: Cathy Wallace, female   DOB: 1917-04-10, 82 y.o.   MRN: [id number]  Location:   [place] of Service:   SNF  Provider:   Cindi Carbon, [place] [phone]   Hollace Kinnier, DO  [name], DO as PCP - General (Geriatric Medicine) [facility]  Extended Emergency Contact Information Primary Emergency Contact: Nadel,Beverly Address: [place] [address] Johnnette Litter of [place] Phone: [phone] Work Phone: [phone] Mobile Phone: [phone] Relation: Daughter Secondary Emergency Contact: Chavis,Larry Address: [address]          [place], [place] [address] Johnnette Litter of [place] Phone: [phone] Work Phone: [phone] Relation: Other  Code Status:  DNR Goals of care: Advanced Directive information Advanced Directives 07/04/2015  Does patient have an advance directive? Yes  Type of Paramedic of [place];Living will;Out of facility DNR (pink MOST or yellow form)  Does patient want to make changes to advanced directive? No - Patient declined  Copy of advanced directive(s) in chart? Yes  Pre-existing out of facility DNR order (yellow form or pink MOST form) Yellow form placed in chart (order not valid for inpatient use)     Chief Complaint  Patient presents with  . Medical Management of Chronic Issues    HPI:  Pt is a 82 y.o. female seen today for management of chronic conditions. She resides in skilled care due progressive functional losses from right foot drop and weakness. She has progressive memory loss as well.  Her goals of care are comfort based and she is followed by hospice.  She reports some anxiety surrounding her caregiver schedule.  She also has intermittent pain to her right arm due to an open humerus fracture and wrist drop that has been difficult to heal, which occurred in  Dec after a fall. She is using a wrist splint and sling. She is using percocet for pain and thi seems to help.  Reports difficulty with BMs. Sleeps well   Functional status: hoyer lift, intermittently incontinent   Past Medical History  Diagnosis Date  . Heart attack ([place]) "two years ago"  . CAD (coronary artery disease)   . HLD (hyperlipidemia)   . PE (pulmonary embolism)   . DVT (deep vein thrombosis) in pregnancy   . Left foot drop   . Pacemaker   . Breast cancer ([place])   . DVT (deep venous thrombosis) ([place]) 2011     Eliquis anticoagulation  . Pulmonary embolism [facility]) Jan 2012    Eliquis anticoagulation  . HTN (hypertension)   . Spinal stenosis   . Bradycardia, on admit 08/09/2012  . Acute MI ([place]) 08/10/2012    2D Echo - EF 55-60%, normal  . Depression   . GERD (gastroesophageal reflux disease)   . Cancer (HCC)     hx of breast cancer  . Arthritis   . Closed pelvic fracture ([place]) 11/11/2012  . Alcohol dependence ([place]) 11/15/2012  . Long term (current) use of anticoagulants 11/15/2012  . Sacral fracture, closed ([place]) 11/15/2012    Bilateral nondisplaced sacral fractures.   . L5 vertebral fracture ([place]) 11/15/2012    Fracture of the L5 left transverse process.    . Anemia 11/15/2012  . B12 deficiency 12/01/2012  . Insomnia 12/01/2012  . Unspecified vitamin D deficiency 12/07/2012  . Anxiety 2013  . Osteoarthritis 02/10/2013  .  Lumbar vertebral fracture (HCC) 06/15/2013    L3-4 12/15/20143  . Anginal pain ([place]) 2014  . Hyperlipemia 2014  . History of fall 2014  . Intertrochanteric fracture of left hip ([place])  11/16/48  . Coronary artery disease 2014  . Urinary tract infection 2015  . Constipation 2015  . Atherosclerosis 2015  . Cerebral atrophy 2015  . Cerebrovascular disease 2015    Small vessel disease  . Right radial fracture 2014  . Right foot drop 2014  . Unstable gait  2014  . Cardiomegaly 2015  . Fracture of right superior pubic ramus ([place]) 2011    Superior and  inferior pubic rami fractures   Past Surgical History  Procedure Laterality Date  . Hip fracture surgery    . Abdominal hysterectomy    . Elbow surgery    . Cataract extraction Bilateral   . Cardiac catheterization  08/08/2012    2.5x42mm Emerge balloon predilation, 2.75x54mm VeriFLEX bare-metal stent was inserted into the RCA to cover proximal to mid segments of RCA deployed with Noncompliant Sprinter balloon 2.75x59mm up to 2.67mm dilated  . Orif wrist fracture Right 07/03/2013    Procedure: OPEN REDUCTION INTERNAL FIXATION (ORIF) RIGHT WRIST FRACTURE;  Surgeon: Linna Hoff, MD;  Location: [place];  Service: Orthopedics;  Laterality: Right;  . Intramedullary (im) nail intertrochanteric Left 11/18/2013    Procedure: IM NAIL  LEFT HIP;  Surgeon: Rozanna Box, MD;  Location[place];  Service: Orthopedics;  Laterality: Left;  . Left heart catheterization with coronary angiogram N/A 08/08/2012    Procedure: LEFT HEART CATHETERIZATION WITH CORONARY ANGIOGRAM;  Surgeon: Troy Sine, MD;  Location: [facility] CATH LAB;  Service: Cardiovascular;  Laterality: N/A;  . Percutaneous coronary stent intervention (pci-s) N/A 08/08/2012    Procedure: PERCUTANEOUS CORONARY STENT INTERVENTION (PCI-S);  Surgeon: Troy Sine, MD;  Location: [facility] CATH LAB;  Service: Cardiovascular;  Laterality: N/A;    Allergies  Allergen Reactions  . Aspirin Other (See Comments)    taking xarelto  . Codeine Nausea And Vomiting  . Aspirin Other (See Comments)    Pt taking xarelto   . Benadryl [Diphenhydramine] Other (See Comments)    unknown  . Codeine Other (See Comments)    unknown  . Pneumococcal Vaccines Other (See Comments)    unknown  . Benadryl [Diphenhydramine Hcl] Other (See Comments)    unknown  . Flu Virus Vaccine Other (See Comments)    unknown  . Penicillins Rash  . Penicillins Rash  . Pneumococcal Vaccines Other (See Comments)    unknown      Medication List       This list is accurate as of: 12/04/15  12:32 PM.  Always use your most recent med list.               acetaminophen 325 MG tablet  Commonly known as:  TYLENOL  Take 650 mg by mouth 3 (three) times daily.     busPIRone 30 MG tablet  Commonly known as:  BUSPAR  Take 30 mg by mouth 2 (two) times daily.     fluticasone 27.5 MCG/SPRAY nasal spray  Commonly known as:  VERAMYST  Place 2 sprays into the nose daily.     hydrOXYzine 25 MG tablet  Commonly known as:  ATARAX/VISTARIL  Take 1 tablet (25 mg total) by mouth every 8 (eight) hours as needed.     loperamide 2 MG tablet  Commonly known as:  IMODIUM A-D  Take 2 mg  by mouth. Take 2 mg four times daily as needed for diarrhea or loose stools     LORazepam 1 MG tablet  Commonly known as:  ATIVAN  Take one tablet by mouth at lunch for anxiety     LORazepam 1 MG tablet  Commonly known as:  ATIVAN  Take 0.5 tablets (0.5 mg total) by mouth every 6 (six) hours as needed for anxiety.     Melatonin 3 MG Tabs  Take 3 mg by mouth daily.     nitroGLYCERIN 0.4 MG SL tablet  Commonly known as:  NITROSTAT  Place 1 tablet (0.4 mg total) under the tongue every 5 (five) minutes x 3 doses as needed for chest pain.     oxyCODONE-acetaminophen 5-325 MG tablet  Commonly known as:  PERCOCET/ROXICET  Take one tablet by mouth every 4 hours as needed for pain scale 1-5; Take two tablets by mouth every 4 hours as needed for pain scale 6-10     QUEtiapine 50 MG tablet  Commonly known as:  SEROQUEL  Take 50 mg by mouth 2 (two) times daily.     sertraline 100 MG tablet  Commonly known as:  ZOLOFT  Take 150 mg by mouth daily.     temazepam 15 MG capsule  Commonly known as:  RESTORIL  Take 15 mg by mouth at bedtime.        Review of Systems  Constitutional: Negative for activity change and appetite change.  HENT: Negative for congestion.   Respiratory: Negative for cough and shortness of breath.   Cardiovascular: Negative for chest pain, palpitations and leg swelling.    Gastrointestinal: Negative for abdominal distention.  Musculoskeletal: Positive for arthralgias and gait problem. Negative for back pain and joint swelling.  Psychiatric/Behavioral: Positive for behavioral problems, confusion and decreased concentration. Negative for hallucinations, sleep disturbance, self-injury and dysphoric mood. The patient is nervous/anxious. The patient is not hyperactive.      There is no immunization history on file for this patient. Pertinent  Health Maintenance Due  Topic Date Due  . DEXA SCAN  02/26/1984  . PNA vac Low Risk Adult (1 of 2 - PCV13) 02/26/1984  . INFLUENZA VACCINE  01/30/2016   Fall Risk  03/23/2015 12/15/2014  Falls in the past year? Yes No  Number falls in past yr: 1 -  Risk for fall due to : History of fall(s) Impaired balance/gait   Functional Status Survey:    Filed Vitals:   12/04/15 1524  BP: 144/76  Pulse: 78  Temp: 97.3 F (36.3 C)  Resp: 18  Weight: 100 lb (45.36 kg)  SpO2: 97%   Body mass index is 17.72 kg/(m^2).  Wt Readings from Last 3 Encounters:  12/04/15 100 lb (45.36 kg)  11/20/15 97 lb (43.999 kg)  10/09/15 95 lb (43.092 kg)    Physical Exam  Constitutional: No distress.  Eyes: Conjunctivae are normal. Pupils are equal, round, and reactive to light. Right eye exhibits no discharge. Left eye exhibits no discharge.  Cardiovascular: Normal rate and regular rhythm.   No murmur heard. Pulmonary/Chest: Effort normal and breath sounds normal.  Abdominal: Soft. Bowel sounds are normal.  Musculoskeletal: She exhibits no edema or tenderness.  Right wrist drop, right foot drop  Neurological: She is alert.  Oriented to her self and situation only  Skin: Skin is warm and dry. She is not diaphoretic.  Psychiatric: She has a normal mood and affect.    Labs reviewed:  Recent Labs  03/24/15 06/27/15  2354 06/28/15 0600  NA 140 139 139  K 3.8 3.5 3.6  CL  --  107 107  CO2  --  19* 24  GLUCOSE  --  169* 120*  BUN  12 14 14   CREATININE 0.8 0.93 0.90  CALCIUM  --  8.7* 8.6*    Recent Labs  06/28/15 0600  AST 18  ALT 13*  ALKPHOS 77  BILITOT 0.6  PROT 5.2*  ALBUMIN 3.2*    Recent Labs  06/27/15 2354 06/28/15 0600 07/04/15 07/11/15  WBC 11.7* 9.8 8.2 8.5  NEUTROABS 10.2*  --   --   --   HGB 12.4 11.1* 8.6* 9.4*  HCT 38.4 33.7* 26* 28*  MCV 97.0 96.8  --   --   PLT 183 180 266  --    Lab Results  Component Value Date   TSH 2.082 08/10/2012   Lab Results  Component Value Date   HGBA1C 6.1* 08/10/2012   Lab Results  Component Value Date   CHOL  07/26/2010    191        ATP III CLASSIFICATION:  <200     mg/dL   Desirable  200-239  mg/dL   Borderline High  >=240    mg/dL   High          HDL 93 07/26/2010   LDLCALC  07/26/2010    83        Total Cholesterol/HDL:CHD Risk Coronary Heart Disease Risk Table                     Men   Women  1/2 Average Risk   3.4   3.3  Average Risk       5.0   4.4  2 X Average Risk   9.6   7.1  3 X Average Risk  23.4   11.0        Use the calculated Patient Ratio above and the CHD Risk Table to determine the patient's CHD Risk.        ATP III CLASSIFICATION (LDL):  <100     mg/dL   Optimal  100-129  mg/dL   Near or Above                    Optimal  130-159  mg/dL   Borderline  160-189  mg/dL   High  >190     mg/dL   Very High   TRIG 77 07/26/2010   CHOLHDL 2.1 07/26/2010    Significant Diagnostic Results in last 30 days:  No results found.  Assessment/Plan  1. Dementia in Alzheimer's disease with late onset Improved agitation with seroquel Continue supportive care  2. Spinal stenosis, unspecified spinal region Denies back pain, continues with right foot drop No new intervention  3. Slow transit constipation Add senokot s 2 qhs  4. Generalized anxiety disorder Improved Continue Zoloft, buspar and ativan  5. Insomnia -continue restoril and melatonin  6. Right humerus fracture Healed with wrist drop Continue prn  percocet     Family/ staff Communication: discussed with nurse  Cindi Carbon, [place] [phone]

## 2016-01-23 ENCOUNTER — Non-Acute Institutional Stay (SKILLED_NURSING_FACILITY): Payer: Medicare Other | Admitting: Internal Medicine

## 2016-01-23 ENCOUNTER — Encounter: Payer: Self-pay | Admitting: Internal Medicine

## 2016-01-23 DIAGNOSIS — H04123 Dry eye syndrome of bilateral lacrimal glands: Secondary | ICD-10-CM

## 2016-01-23 DIAGNOSIS — G301 Alzheimer's disease with late onset: Secondary | ICD-10-CM | POA: Diagnosis not present

## 2016-01-23 DIAGNOSIS — R627 Adult failure to thrive: Secondary | ICD-10-CM | POA: Diagnosis not present

## 2016-01-23 DIAGNOSIS — H9193 Unspecified hearing loss, bilateral: Secondary | ICD-10-CM

## 2016-01-23 DIAGNOSIS — K5901 Slow transit constipation: Secondary | ICD-10-CM | POA: Diagnosis not present

## 2016-01-23 DIAGNOSIS — F411 Generalized anxiety disorder: Secondary | ICD-10-CM | POA: Diagnosis not present

## 2016-01-23 DIAGNOSIS — F0280 Dementia in other diseases classified elsewhere without behavioral disturbance: Secondary | ICD-10-CM

## 2016-01-23 NOTE — Progress Notes (Signed)
Patient ID: ATZIRY MARKEY, female   DOB: 03-11-1917, 82 y.o.   MRN: [id number]  Location:   [place] Room Number: [id number] of Service:  SNF (31) Provider:  Fayne Mcguffee L. Mariea Clonts, D.O., C.M.D.  Hollace Kinnier, DO  [name], DO as PCP - General (Geriatric Medicine) [facility]  Extended Emergency Contact Information Primary Emergency Contact: Nadel,Beverly Address: [place] [address] Johnnette Litter of [place] Phone: [phone] Work Phone: [phone] Mobile Phone: [phone] Relation: Daughter Secondary Emergency Contact: Speyer,Larry Address: [address]          [place], [place] [address] Johnnette Litter of [place] Phone: [phone] Work Phone: [phone] Relation: Other  Code Status:  DNR, hospice care Goals of care: Advanced Directive information Advanced Directives 01/23/2016  Does patient have an advance directive? Yes  Type of Advance Directive Out of facility DNR (pink MOST or yellow form);Healthcare Power of Attorney  Does patient want to make changes to advanced directive? -  Copy of advanced directive(s) in chart? Yes  Pre-existing out of facility DNR order (yellow form or pink MOST form) Yellow form placed in chart (order not valid for inpatient use);Pink MOST form placed in chart (order not valid for inpatient use)   Chief Complaint  Patient presents with  . Medical Management of Chronic Issues    routine visit    HPI:  Pt is a 82 y.o. female seen today for medical management of chronic diseases.  She appears to be doing better.  She admits to continue chronic anxiety and worry about her daily schedule.  She repeats herself frequently during the visit.  She did not complain of pain in her right arm when asked.  She has been on hospice now since May.  She appears more alert and interactive than she'd been for some time after her right arm fracture.  Pain meds  must have sedated her and also some of her anxiety meds.  Family had been concerned about that and meds were adjusted.  It actually looks like when all was said and done, she might be on just as much or more than before.  She's also on buspar 30mg  daily.  She c/o boredom and being unable to really go anywhere or do anything for herself.  She also reports worsening of her hearing.  She did not have her hearing aides in when seen.  She also c/o dry eyes.    Past Medical History:  Diagnosis Date  . Acute MI ([place]) 08/10/2012   2D Echo - EF 55-60%, normal  . Alcohol dependence ([place]) 11/15/2012  . Anemia 11/15/2012  . Anginal pain ([place]) 2014  . Anxiety 2013  . Arthritis   . Atherosclerosis 2015  . B12 deficiency 12/01/2012  . Bradycardia, on admit 08/09/2012  . Breast cancer ([place])   . CAD (coronary artery disease)   . Cancer (HCC)    hx of breast cancer  . Cardiomegaly 2015  . Cerebral atrophy 2015  . Cerebrovascular disease 2015   Small vessel disease  . Closed pelvic fracture ([place]) 11/11/2012  . Constipation 2015  . Coronary artery disease 2014  . Depression   . DVT (deep vein thrombosis) in pregnancy   . DVT (deep venous thrombosis) ([place]) 2011    Eliquis anticoagulation  . Fracture of right superior pubic ramus ([place]) 2011   Superior and inferior pubic rami fractures  .  GERD (gastroesophageal reflux disease)   . Heart attack ([place]) "two years ago"  . History of fall 2014  . HLD (hyperlipidemia)   . HTN (hypertension)   . Hyperlipemia 2014  . Insomnia 12/01/2012  . Intertrochanteric fracture of left hip ([place])  11/16/48  . L5 vertebral fracture ([place]) 11/15/2012   Fracture of the L5 left transverse process.    . Left foot drop   . Long term (current) use of anticoagulants 11/15/2012  . Lumbar vertebral fracture (HCC) 06/15/2013   L3-4 12/15/20143  . Osteoarthritis 02/10/2013  . Pacemaker   . PE (pulmonary embolism)   . Pulmonary embolism [facility]) Jan 2012   Eliquis anticoagulation  . Right  foot drop 2014  . Right radial fracture 2014  . Sacral fracture, closed ([place]) 11/15/2012   Bilateral nondisplaced sacral fractures.   Marland Kitchen Spinal stenosis   . Unspecified vitamin D deficiency 12/07/2012  . Unstable gait  2014  . Urinary tract infection 2015   Past Surgical History:  Procedure Laterality Date  . ABDOMINAL HYSTERECTOMY    . CARDIAC CATHETERIZATION  08/08/2012   2.5x19mm Emerge balloon predilation, 2.75x63mm VeriFLEX bare-metal stent was inserted into the RCA to cover proximal to mid segments of RCA deployed with Noncompliant Sprinter balloon 2.75x69mm up to 2.47mm dilated  . CATARACT EXTRACTION Bilateral   . ELBOW SURGERY    . HIP FRACTURE SURGERY    . INTRAMEDULLARY (IM) NAIL INTERTROCHANTERIC Left 11/18/2013   Procedure: IM NAIL  LEFT HIP;  Surgeon: Rozanna Box, MD;  Location: [place];  Service: Orthopedics;  Laterality: Left;  . LEFT HEART CATHETERIZATION WITH CORONARY ANGIOGRAM N/A 08/08/2012   Procedure: LEFT HEART CATHETERIZATION WITH CORONARY ANGIOGRAM;  Surgeon: Troy Sine, MD;  Location: [facility] CATH LAB;  Service: Cardiovascular;  Laterality: N/A;  . ORIF WRIST FRACTURE Right 07/03/2013   Procedure: OPEN REDUCTION INTERNAL FIXATION (ORIF) RIGHT WRIST FRACTURE;  Surgeon: Linna Hoff, MD;  Location: [place];  Service: Orthopedics;  Laterality: Right;  . PERCUTANEOUS CORONARY STENT INTERVENTION (PCI-S) N/A 08/08/2012   Procedure: PERCUTANEOUS CORONARY STENT INTERVENTION (PCI-S);  Surgeon: Troy Sine, MD;  Location: [facility] CATH LAB;  Service: Cardiovascular;  Laterality: N/A;    Allergies  Allergen Reactions  . Aspirin Other (See Comments)    taking xarelto  . Codeine Nausea And Vomiting  . Aspirin Other (See Comments)    Pt taking xarelto   . Benadryl [Diphenhydramine] Other (See Comments)    unknown  . Codeine Other (See Comments)    unknown  . Pneumococcal Vaccines Other (See Comments)    unknown  . Benadryl [Diphenhydramine Hcl] Other (See Comments)    unknown  .  Flu Virus Vaccine Other (See Comments)    unknown  . Penicillins Rash  . Penicillins Rash  . Pneumococcal Vaccines Other (See Comments)    unknown      Medication List       Accurate as of 01/23/16  3:04 PM. Always use your most recent med list.          acetaminophen 325 MG tablet Commonly known as:  TYLENOL Take 650 mg by mouth 3 (three) times daily.   busPIRone 30 MG tablet Commonly known as:  BUSPAR Take 30 mg by mouth 2 (two) times daily.   feeding supplement Liqd Take 1 Container by mouth every morning.   fluticasone 27.5 MCG/SPRAY nasal spray Commonly known as:  VERAMYST Place 2 sprays into the nose daily.   fluticasone 50 MCG/ACT nasal  spray Commonly known as:  FLONASE Place 2 sprays into both nostrils daily.   hydrOXYzine 25 MG tablet Commonly known as:  ATARAX/VISTARIL Take 1 tablet (25 mg total) by mouth every 8 (eight) hours as needed.   loperamide 2 MG tablet Commonly known as:  IMODIUM A-D Take 2 mg by mouth. Take 2 mg four times daily as needed for diarrhea or loose stools   LORazepam 0.5 MG tablet Commonly known as:  ATIVAN Take 0.5 by mouth at lunch, 0.5 by mouth at bedtime and 0.5 by mouth every 6 hours as needed for anxiety   LORazepam 1 MG tablet Commonly known as:  ATIVAN Take 1 mg by mouth every morning.   Melatonin 3 MG Tabs Take 3 mg by mouth daily.   nitroGLYCERIN 0.4 MG SL tablet Commonly known as:  NITROSTAT Place 1 tablet (0.4 mg total) under the tongue every 5 (five) minutes x 3 doses as needed for chest pain.   oxyCODONE-acetaminophen 5-325 MG tablet Commonly known as:  PERCOCET/ROXICET Take one tablet by mouth every 4 hours as needed for pain scale 1-5; Take two tablets by mouth every 4 hours as needed for pain scale 6-10   QUEtiapine 50 MG tablet Commonly known as:  SEROQUEL Take 50 mg by mouth 2 (two) times daily.   sertraline 100 MG tablet Commonly known as:  ZOLOFT Take 150 mg by mouth daily.   temazepam 15 MG  capsule Commonly known as:  RESTORIL Take 15 mg by mouth at bedtime.       Review of Systems  Constitutional: Positive for appetite change. Negative for fatigue and fever.       Continues to gradually lose weight  HENT: Positive for hearing loss. Negative for sinus pressure.   Eyes:       Dry eyes  Respiratory: Negative for cough, shortness of breath and wheezing.   Cardiovascular: Negative for chest pain, palpitations and leg swelling.       Edema controlled with compression hose  Genitourinary: Negative for dysuria.       Incontinent  Musculoskeletal: Negative for arthralgias.       Denies upper arm pain at present  Neurological: Negative for dizziness.  Psychiatric/Behavioral: Positive for confusion. Negative for sleep disturbance. The patient is nervous/anxious.        Sleeping well with meds    Immunization History  Administered Date(s) Administered  . Tdap 07/02/2015   Pertinent  Health Maintenance Due  Topic Date Due  . DEXA SCAN  02/26/1984  . PNA vac Low Risk Adult (1 of 2 - PCV13) 02/26/1984  . INFLUENZA VACCINE  01/30/2016   Fall Risk  03/23/2015 12/15/2014  Falls in the past year? Yes No  Number falls in past yr: 1 -  Risk for fall due to : History of fall(s) Impaired balance/gait   Functional Status Survey:    Vitals:   01/23/16 1436  BP: 122/67  Pulse: 62  Resp: (!) 21  Temp: 97.5 F (36.4 C)  TempSrc: Oral  SpO2: 96%   There is no height or weight on file to calculate BMI. Physical Exam  Constitutional: No distress.  Cardiovascular:  Compression hose in place; irreg irreg  Pulmonary/Chest: Effort normal and breath sounds normal. No respiratory distress.  Abdominal: Soft. Bowel sounds are normal.  Musculoskeletal:  Right foot drop  Neurological: She is alert.  Skin: Skin is warm and dry.  Psychiatric:  Anxious, repeats questions multiple times (about eyes and ears)    Labs  reviewed:  Recent Labs  06/27/15 2354 06/28/15 0600  08/15/15 0755  NA 139 139 141  K 3.5 3.6 3.7  CL 107 107  --   CO2 19* 24  --   GLUCOSE 169* 120*  --   BUN 14 14 17   CREATININE 0.93 0.90 0.6  CALCIUM 8.7* 8.6*  --     Recent Labs  06/28/15 0600 08/15/15 0755  AST 18 17  ALT 13* 12  ALKPHOS 77 114  BILITOT 0.6  --   PROT 5.2*  --   ALBUMIN 3.2*  --     Recent Labs  06/27/15 2354 06/28/15 0600 07/04/15 07/11/15 08/15/15 0755  WBC 11.7* 9.8 8.2 8.5 6.8  NEUTROABS 10.2*  --   --   --   --   HGB 12.4 11.1* 8.6* 9.4* 11.9*  HCT 38.4 33.7* 26* 28* 37  MCV 97.0 96.8  --   --   --   PLT 183 180 266  --  255   Lab Results  Component Value Date   TSH 2.082 08/10/2012   Lab Results  Component Value Date   HGBA1C 6.1 (H) 08/10/2012   Lab Results  Component Value Date   CHOL  07/26/2010    191        ATP III CLASSIFICATION:  <200     mg/dL   Desirable  200-239  mg/dL   Borderline High  >=240    mg/dL   High          HDL 93 07/26/2010   LDLCALC  07/26/2010    83        Total Cholesterol/HDL:CHD Risk Coronary Heart Disease Risk Table                     Men   Women  1/2 Average Risk   3.4   3.3  Average Risk       5.0   4.4  2 X Average Risk   9.6   7.1  3 X Average Risk  23.4   11.0        Use the calculated Patient Ratio above and the CHD Risk Table to determine the patient's CHD Risk.        ATP III CLASSIFICATION (LDL):  <100     mg/dL   Optimal  100-129  mg/dL   Near or Above                    Optimal  130-159  mg/dL   Borderline  160-189  mg/dL   High  >190     mg/dL   Very High   TRIG 77 07/26/2010   CHOLHDL 2.1 07/26/2010    Assessment/Plan 1. Dry eyes, bilateral -systane preservative free drops orders for her  2. Hearing loss, bilateral -if family desires, may need audiology f/u and needs to use the hearing aides all day  3. Generalized anxiety disorder -cont current regimen per psychiatry--favor only one provider adjusting these kinds of meds (still on large amts of medication:   restoril 15mg  at hs, zoloft 150mg  daily, seroquel 50mg  po bid, melatonin 3mg  at hs, ativan 1mg  daily in am and  0.5mg  bid and every 6 hrs prn, buspar 30mg  bid, hydroxyzine for itching every 8 hrs as needed)--also pt on hospice and does not appear uncomfortable now -these certainly explain her worsening memory loss and confusion  4. Slow transit constipation -not on active meds for this, in fact,has as  needed imodium now  5. FTT (failure to thrive) in adult -continues to lose weight and decline overall, but is more alert with some decrease in ativan  6. Dementia in Alzheimer's disease with late onset -not on meds for memory at this point and not indicated at this advanced stage of dementia -cont comfort measures  Family/ staff Communication: -if family continues to have concerns about oversedation and confusion, would favor reducing benzos first  Labs/tests ordered:  None due to goals of care

## 2016-01-24 DIAGNOSIS — H04123 Dry eye syndrome of bilateral lacrimal glands: Secondary | ICD-10-CM | POA: Insufficient documentation

## 2016-02-01 ENCOUNTER — Other Ambulatory Visit: Payer: Self-pay

## 2016-02-01 MED ORDER — LORAZEPAM 1 MG PO TABS: ORAL_TABLET | ORAL | 5 refills | Status: DC

## 2016-02-01 NOTE — Telephone Encounter (Signed)
Faxed to [facility] Fax Number: [phone], Phone Number [phone]  

## 2016-02-05 ENCOUNTER — Other Ambulatory Visit: Payer: Self-pay

## 2016-02-05 MED ORDER — TEMAZEPAM 15 MG PO CAPS: 15.0000 mg | ORAL_CAPSULE | Freq: Every day | ORAL | 5 refills | Status: AC

## 2016-02-05 NOTE — Telephone Encounter (Signed)
Faxed to [facility] Fax Number: [phone], Phone Number [phone]  

## 2016-02-15 ENCOUNTER — Non-Acute Institutional Stay (SKILLED_NURSING_FACILITY): Payer: Medicare Other | Admitting: Adult Health

## 2016-02-15 ENCOUNTER — Encounter: Payer: Self-pay | Admitting: Adult Health

## 2016-02-15 DIAGNOSIS — M79642 Pain in left hand: Secondary | ICD-10-CM | POA: Diagnosis not present

## 2016-02-15 NOTE — Progress Notes (Signed)
Patient ID: Cathy Wallace, female   DOB: 11/11/1916, 82 y.o.   MRN: [id number]   Location:   [facility]   Place of Service:  SNF (31) Provider:   Cindi Carbon, ANP [facility] [phone]   REED, Jonelle Sidle, DO  [name], DO as PCP - General (Geriatric Medicine) [facility]  Extended Emergency Contact Information Primary Emergency Contact: Nadel,Beverly Address: [place] [address] Johnnette Litter of [place] Phone: [phone] Work Phone: [phone] Mobile Phone: [phone] Relation: Daughter Secondary Emergency Contact: Mericle,Larry Address: [address]          [place], [place] [address] Johnnette Litter of [place] Phone: [phone] Work Phone: [phone] Relation: Other  Code Status:  DNR Goals of care: Advanced Directive information Advanced Directives 01/23/2016  Does patient have an advance directive? Yes  Type of Advance Directive Out of facility DNR (pink MOST or yellow form);Healthcare Power of Attorney  Does patient want to make changes to advanced directive? -  Copy of advanced directive(s) in chart? Yes  Pre-existing out of facility DNR order (yellow form or pink MOST form) Yellow form placed in chart (order not valid for inpatient use);Pink MOST form placed in chart (order not valid for inpatient use)     Chief Complaint  Patient presents with  . Acute Visit    left hand swelling and pain    HPI:  Pt is a 82 y.o. female seen today for left hand swelling and pain. She reports this started within the past few days but she is uncertain. She has memory loss and FTT and is followed by hospice. She has a hx of right humerus fracture and right ulnar neuropathy.  She wears a brace for comfort to the right upper arm and a brace to her wrist.  She reports someone dropped something on her hand and after that it hurt and was swollen. There is no noted injury per the  staff. There is no bruising but some warmth, no redness, and mild edema.   Past Medical History:  Diagnosis Date  . Acute MI ([place]) 08/10/2012   2D Echo - EF 55-60%, normal  . Alcohol dependence ([place]) 11/15/2012  . Anemia 11/15/2012  . Anginal pain ([place]) 2014  . Anxiety 2013  . Arthritis   . Atherosclerosis 2015  . B12 deficiency 12/01/2012  . Bradycardia, on admit 08/09/2012  . Breast cancer ([place])   . CAD (coronary artery disease)   . Cancer (H[facility])    hx of breast cancer  . Cardiomegaly 2015  . Cerebral atrophy 2015  . Cerebrovascular disease 2015   Small vessel disease  . Closed pelvic fracture ([place]) 11/11/2012  . Constipation 2015  . Coronary artery disease 2014  . Depression   . DVT (deep vein thrombosis) in pregnancy   . DVT (deep venous thrombosis) ([place]) 2011    Eliquis anticoagulation  . Fracture of right superior pubic ramus ([place]) 2011   Superior and inferior pubic rami fractures  . GERD (gastroesophageal reflux disease)   . Heart attack ([place]) "two years ago"  . History of fall 2014  . HLD (hyperlipidemia)   . HTN (hypertension)   . Hyperlipemia 2014  . Insomnia 12/01/2012  . Intertrochanteric fracture of left hip ([place])  11/16/48  . L5 vertebral fracture ([place]) 11/15/2012   Fracture of the L5 left transverse process.    . Left foot drop   .  Long term (current) use of anticoagulants 11/15/2012  . Lumbar vertebral fracture (H[facility]) 06/15/2013   L3-4 12/15/20143  . Osteoarthritis 02/10/2013  . Pacemaker   . PE (pulmonary embolism)   . Pulmonary embolism [facility]) Jan 2012   Eliquis anticoagulation  . Right foot drop 2014  . Right radial fracture 2014  . Sacral fracture, closed ([place]) 11/15/2012   Bilateral nondisplaced sacral fractures.   Marland Kitchen Spinal stenosis   . Unspecified vitamin D deficiency 12/07/2012  . Unstable gait  2014  . Urinary tract infection 2015   Past Surgical History:  Procedure Laterality Date  . ABDOMINAL HYSTERECTOMY    . CARDIAC CATHETERIZATION  08/08/2012    2.5x1mm Emerge balloon predilation, 2.75x52mm VeriFLEX bare-metal stent was inserted into the RCA to cover proximal to mid segments of RCA deployed with Noncompliant Sprinter balloon 2.75x79mm up to 2.64mm dilated  . CATARACT EXTRACTION Bilateral   . ELBOW SURGERY    . HIP FRACTURE SURGERY    . INTRAMEDULLARY (IM) NAIL INTERTROCHANTERIC Left 11/18/2013   Procedure: IM NAIL  LEFT HIP;  Surgeon: Rozanna Box, MD;  Location: [facility];  Service: Orthopedics;  Laterality: Left;  . LEFT HEART CATHETERIZATION WITH CORONARY ANGIOGRAM N/A 08/08/2012   Procedure: LEFT HEART CATHETERIZATION WITH CORONARY ANGIOGRAM;  Surgeon: Troy Sine, MD;  Location: [facility] C[facility] LAB;  Service: Cardiovascular;  Laterality: N/A;  . ORIF WRIST FRACTURE Right 07/03/2013   Procedure: OPEN REDUCTION INTERNAL FIXATION (ORIF) RIGHT WRIST FRACTURE;  Surgeon: Linna Hoff, MD;  Location: [facility];  Service: Orthopedics;  Laterality: Right;  . PERCUTANEOUS CORONARY STENT INTERVENTION (PCI-S) N/A 08/08/2012   Procedure: PERCUTANEOUS CORONARY STENT INTERVENTION (PCI-S);  Surgeon: Troy Sine, MD;  Location: [facility] CATH LAB;  Service: Cardiovascular;  Laterality: N/A;    Allergies  Allergen Reactions  . Aspirin Other (See Comments)    taking xarelto  . Codeine Nausea And Vomiting  . Aspirin Other (See Comments)    Pt taking xarelto   . Benadryl [Diphenhydramine] Other (See Comments)    unknown  . Codeine Other (See Comments)    unknown  . Pneumococcal Vaccines Other (See Comments)    unknown  . Benadryl [Diphenhydramine Hcl] Other (See Comments)    unknown  . Flu Virus Vaccine Other (See Comments)    unknown  . Penicillins Rash  . Penicillins Rash  . Pneumococcal Vaccines Other (See Comments)    unknown      Medication List       Accurate as of 02/15/16  2:59 PM. Always use your most recent med list.          acetaminophen 325 MG tablet Commonly known as:  TYLENOL Take 650 mg by mouth 3 (three) times daily.     busPIRone 30 MG tablet Commonly known as:  BUSPAR Take 30 mg by mouth 2 (two) times daily.   feeding supplement Liqd Take 1 Container by mouth every morning.   fluticasone 27.5 MCG/SPRAY nasal spray Commonly known as:  VERAMYST Place 2 sprays into the nose daily.   fluticasone 50 MCG/ACT nasal spray Commonly known as:  FLONASE Place 2 sprays into both nostrils daily.   hydrOXYzine 25 MG tablet Commonly known as:  ATARAX/VISTARIL Take 1 tablet (25 mg total) by mouth every 8 (eight) hours as needed.   loperamide 2 MG tablet Commonly known as:  IMODIUM A-D Take 2 mg by mouth. Take 2 mg four times daily as needed for diarrhea or loose stools   LORazepam 0.5  MG tablet Commonly known as:  ATIVAN Take 0.5 by mouth at lunch, 0.5 by mouth at bedtime and 0.5 by mouth every 6 hours as needed for anxiety   LORazepam 1 MG tablet Commonly known as:  ATIVAN 1/2 by mouth every 6 hours as needed for anxiety   Melatonin 3 MG Tabs Take 3 mg by mouth daily.   nitroGLYCERIN 0.4 MG SL tablet Commonly known as:  NITROSTAT Place 1 tablet (0.4 mg total) under the tongue every 5 (five) minutes x 3 doses as needed for chest pain.   oxyCODONE-acetaminophen 5-325 MG tablet Commonly known as:  PERCOCET/ROXICET Take one tablet by mouth every 4 hours as needed for pain scale 1-5; Take two tablets by mouth every 4 hours as needed for pain scale 6-10   QUEtiapine 50 MG tablet Commonly known as:  SEROQUEL Take 50 mg by mouth 2 (two) times daily.   senna-docusate 8.6-50 MG tablet Commonly known as:  Senokot-S Take 2 tablets by mouth at bedtime.   sertraline 100 MG tablet Commonly known as:  ZOLOFT Take 150 mg by mouth daily.   temazepam 15 MG capsule Commonly known as:  RESTORIL Take 1 capsule (15 mg total) by mouth at bedtime.       Review of Systems  Constitutional: Negative for activity change, appetite change, chills, diaphoresis, fatigue and fever.  Musculoskeletal: Positive for  arthralgias, gait problem and joint swelling. Negative for back pain, myalgias and neck pain.  Skin: Negative for color change and wound.    Immunization History  Administered Date(s) Administered  . Tdap 07/02/2015   Pertinent  Health Maintenance Due  Topic Date Due  . DEXA SCAN  02/26/1984  . PNA vac Low Risk Adult (1 of 2 - PCV13) 02/26/1984  . INFLUENZA VACCINE  01/30/2016   Fall Risk  03/23/2015 12/15/2014  Falls in the past year? Yes No  Number falls in past yr: 1 -  Risk for fall due to : History of fall(s) Impaired balance/gait   Functional Status Survey:    Vitals:   02/15/16 1450  BP: (!) 142/65  Pulse: (!) 58  Resp: 16  Temp: 97.4 F (36.3 C)  SpO2: 93%  Weight: 103 lb 6.4 oz (46.9 kg)   Body mass index is 18.32 kg/m. Physical Exam  Constitutional: No distress.  Neck: Normal range of motion. Neck supple. No JVD present. No tracheal deviation present. No thyromegaly present.  Cardiovascular: Normal rate and regular rhythm.   No murmur heard. Pulmonary/Chest: Effort normal and breath sounds normal. She has no rales.  Abdominal: Soft. Bowel sounds are normal. She exhibits no distension.  Musculoskeletal: She exhibits edema (left hand, generalized) and tenderness (left hand generalized, no erythema).  Ulnar neuropathy to the right hand  Neurological: She is alert.  Oriented to self, place, and situation, not time  Skin: Skin is warm and dry. She is not diaphoretic. There is pallor.  Psychiatric: She has a normal mood and affect.  Nursing note and vitals reviewed.   Labs reviewed:  Recent Labs  06/27/15 2354 06/28/15 0600 08/15/15 0755  NA 139 139 141  K 3.5 3.6 3.7  CL 107 107  --   CO2 19* 24  --   GLUCOSE 169* 120*  --   BUN 14 14 17   CREATININE 0.93 0.90 0.6  CALCIUM 8.7* 8.6*  --     Recent Labs  06/28/15 0600 08/15/15 0755  AST 18 17  ALT 13* 12  ALKPHOS 77 114  BILITOT 0.6  --   PROT 5.2*  --   ALBUMIN 3.2*  --     Recent  Labs  06/27/15 2354 06/28/15 0600 07/04/15 07/11/15 08/15/15 0755  WBC 11.7* 9.8 8.2 8.5 6.8  NEUTROABS 10.2*  --   --   --   --   HGB 12.4 11.1* 8.6* 9.4* 11.9*  HCT 38.4 33.7* 26* 28* 37  MCV 97.0 96.8  --   --   --   PLT 183 180 266  --  255   Lab Results  Component Value Date   TSH 2.082 08/10/2012   Lab Results  Component Value Date   HGBA1C 6.1 (H) 08/10/2012   Lab Results  Component Value Date   CHOL  07/26/2010    191        ATP III CLASSIFICATION:  <200     mg/dL   Desirable  200-239  mg/dL   Borderline High  >=240    mg/dL   High          HDL 93 07/26/2010   LDLCALC  07/26/2010    83        Total Cholesterol/HDL:CHD Risk Coronary Heart Disease Risk Table                     Men   Women  1/2 Average Risk   3.4   3.3  Average Risk       5.0   4.4  2 X Average Risk   9.6   7.1  3 X Average Risk  23.4   11.0        Use the calculated Patient Ratio above and the CHD Risk Table to determine the patient's CHD Risk.        ATP III CLASSIFICATION (LDL):  <100     mg/dL   Optimal  100-129  mg/dL   Near or Above                    Optimal  130-159  mg/dL   Borderline  160-189  mg/dL   High  >190     mg/dL   Very High   TRIG 77 07/26/2010   CHOLHDL 2.1 07/26/2010    Significant Diagnostic Results in last 30 days:  No results found.  Assessment/Plan  1) Left hand pain -Check left hand xray to rule out fracture -consider inflammatory arthritis or gout in the differential, although the hx leans more towards injury. It is not exquisitely tender as you would see with gout.  -has pain meds per hospice  Family/ staff Communication: discussed with staff  Labs/tests ordered:  xray

## 2016-03-13 IMAGING — RF DG C-ARM 61-120 MIN
1 series · 4 of 4 positions shown · non-contrast
Comparison: Left hip radiographs dated 11/16/2013.

CLINICAL DATA: Left intertrochanteric femur fracture.

EXAM:
LEFT FEMUR - 2 VIEW; DG C-ARM 61-120 MIN

[Series 1: run · 4 of 4 slices shown]
[im 1/4]
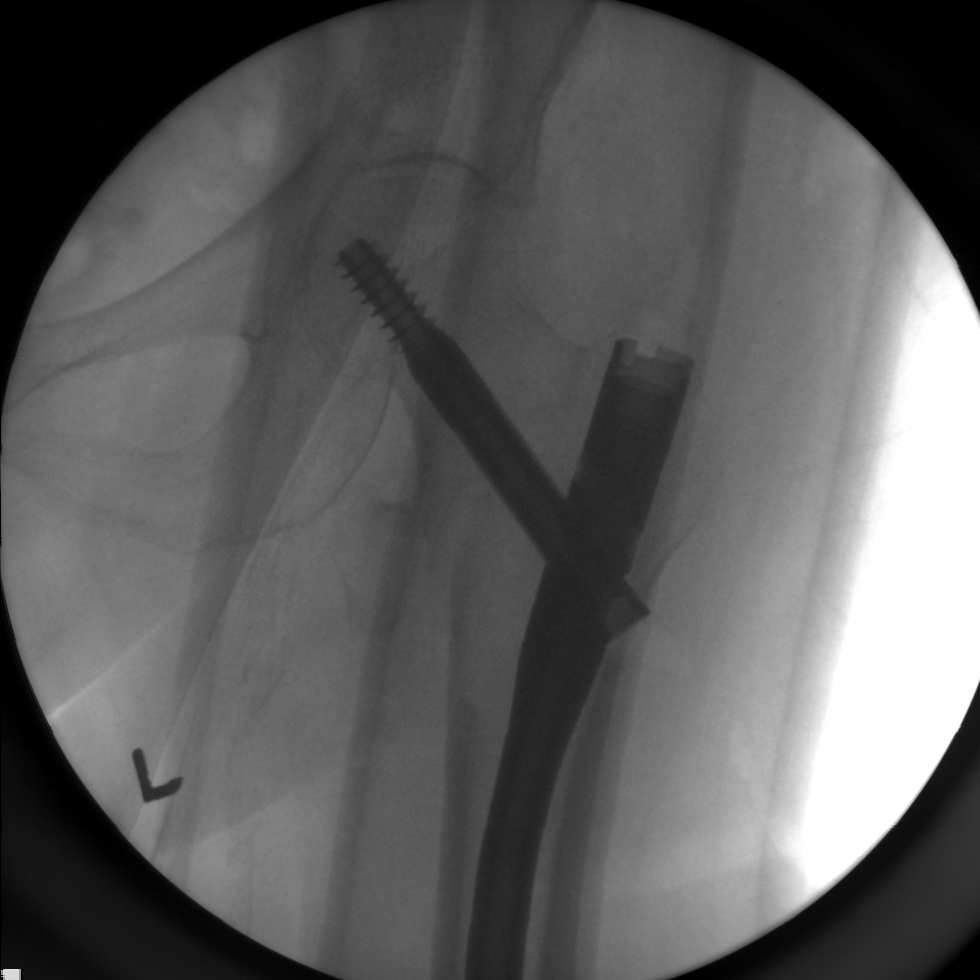
[im 2/4]
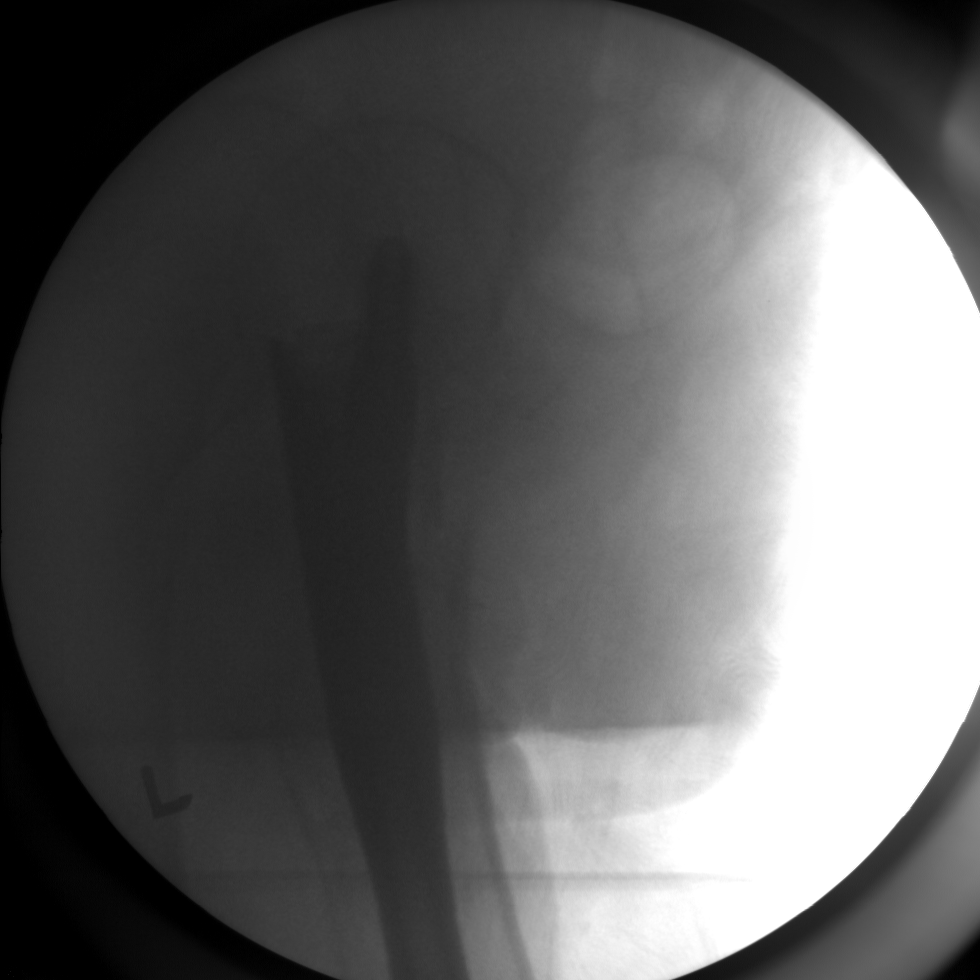
[im 3/4]
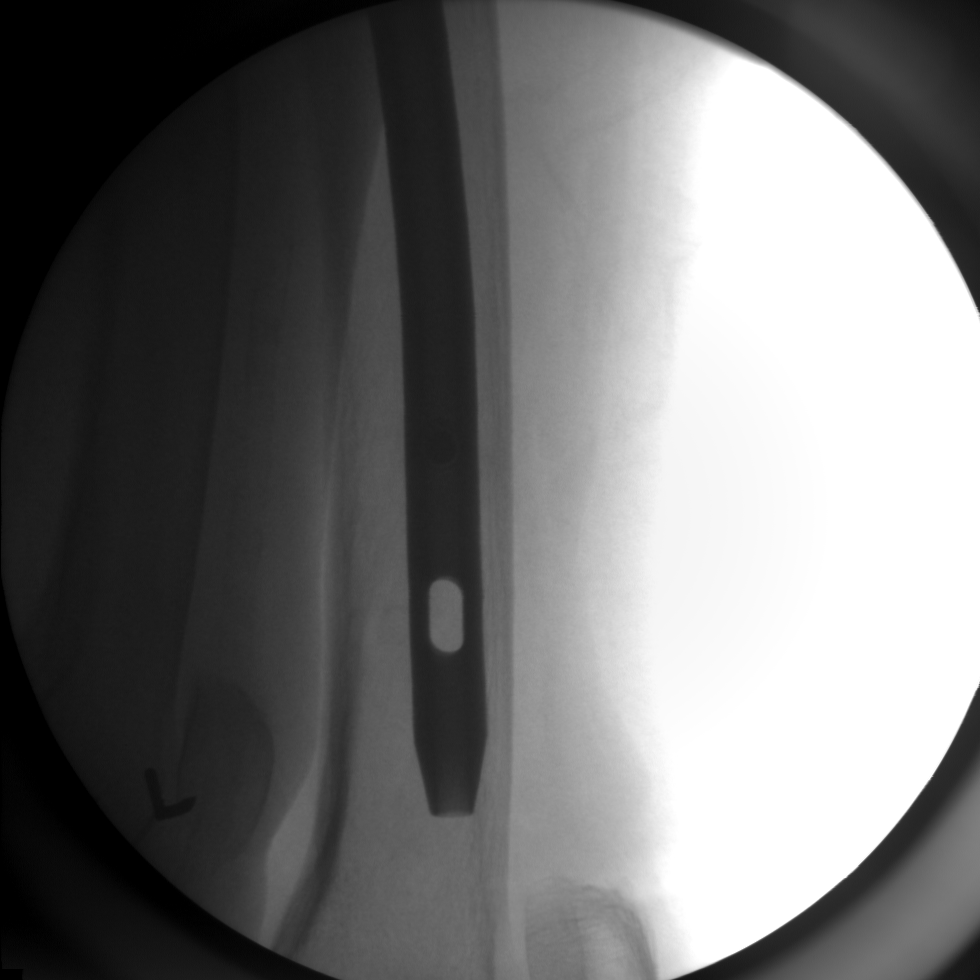
[im 4/4]
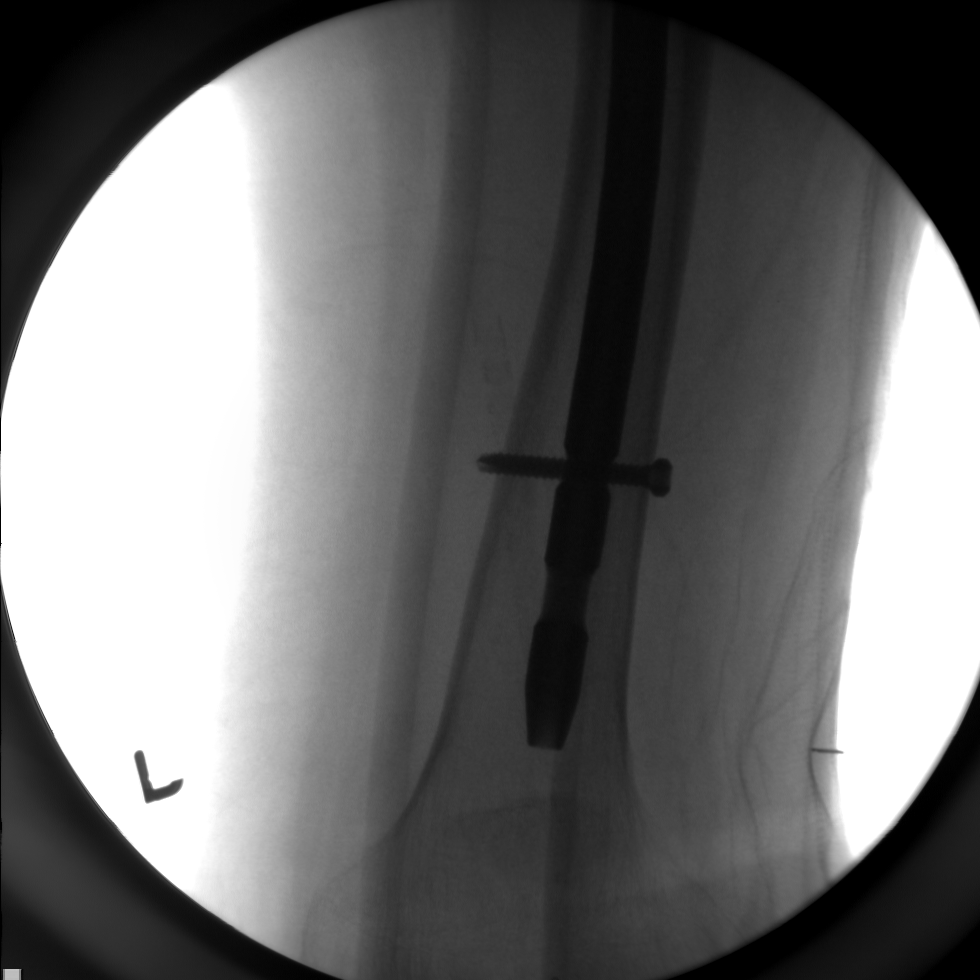

[4 of 4 positions shown; findings below may reference images not displayed]

FINDINGS: Compression screw and rod fixation of the previously seen
intertrochanteric fracture. Mild residual lateral displacement of
the distal fragment as well as medial displacement lesser trochanter
fragment.
IMPRESSION: Hardware fixation of the previously seen left intertrochanteric
fracture.

## 2016-03-18 ENCOUNTER — Non-Acute Institutional Stay (SKILLED_NURSING_FACILITY): Payer: Medicare Other | Admitting: Adult Health

## 2016-03-18 ENCOUNTER — Encounter: Payer: Self-pay | Admitting: Adult Health

## 2016-03-18 DIAGNOSIS — G301 Alzheimer's disease with late onset: Secondary | ICD-10-CM | POA: Diagnosis not present

## 2016-03-18 DIAGNOSIS — G4700 Insomnia, unspecified: Secondary | ICD-10-CM | POA: Diagnosis not present

## 2016-03-18 DIAGNOSIS — F411 Generalized anxiety disorder: Secondary | ICD-10-CM

## 2016-03-18 DIAGNOSIS — N3281 Overactive bladder: Secondary | ICD-10-CM | POA: Diagnosis not present

## 2016-03-18 DIAGNOSIS — F0280 Dementia in other diseases classified elsewhere without behavioral disturbance: Secondary | ICD-10-CM

## 2016-03-18 DIAGNOSIS — F329 Major depressive disorder, single episode, unspecified: Secondary | ICD-10-CM

## 2016-03-18 DIAGNOSIS — I10 Essential (primary) hypertension: Secondary | ICD-10-CM | POA: Diagnosis not present

## 2016-03-18 DIAGNOSIS — K5901 Slow transit constipation: Secondary | ICD-10-CM | POA: Diagnosis not present

## 2016-03-18 DIAGNOSIS — F32A Depression, unspecified: Secondary | ICD-10-CM

## 2016-03-18 NOTE — Progress Notes (Signed)
Patient ID: ANIFER ZENTGRAF, female   DOB: 1916-11-30, 82 y.o.   MRN: [id number]  Location:   [place] of Service:  SNF ([id number])SNF  Provider:   Cindi Carbon, ANP [place] [phone]   REED, Jonelle Sidle, DO  [name], DO as PCP - General (Geriatric Medicine) [facility]  Extended Emergency Contact Information Primary Emergency Contact: Nadel,Beverly Address: [place] [address] Johnnette Litter of [place] Phone: [phone] Work Phone: [phone] Mobile Phone: [phone] Relation: Daughter Secondary Emergency Contact: Faller,Larry Address: [address]          [place], [place] [address] Johnnette Litter of [place] Phone: [phone] Work Phone: [phone] Relation: Other  Code Status:  DNR Goals of care: Advanced Directive information Advanced Directives 02/15/2016  Does patient have an advance directive? Yes  Type of Advance Directive Out of facility DNR (pink MOST or yellow form);[place];Living will  Does patient want to make changes to advanced directive? -  Copy of advanced directive(s) in chart? Yes  Pre-existing out of facility DNR order (yellow form or pink MOST form) Pink MOST form placed in chart (order not valid for inpatient use)     Chief Complaint  Patient presents with  . Medical Management of Chronic Issues    HPI:  Ms. Manes is a 82 y.o. female seen today for management of chronic conditions. She resides in skilled care due progressive functional losses from right foot drop and weakness. She has progressive memory loss as well.  Her goals of care are comfort based and she is followed by hospice.   1. Dementia in Alzheimer's disease with late onset She continues to have anxiety and has confusion regarding her sitter care and routine. Staff reports that she had an episode of anxiety and yelling out  yesterday.  Her last MMSE on 7/16 was 24/30. She had deficits in serial 7s, recall, and image copying. She states she still enjoys reading books.   2. Depression She states she is depressed today because it is a Monday and the start of another week. She is taking Seroquel and Zoloft for depression.   3. Insomnia She states she has no trouble falling, but states sometimes she wakes up because she hears music and there is no way to turn it off. She takes Melatonin and Restoril for sleep.   4. Generalized anxiety disorder She has been given Ativan 0.5 mg prn twice in the last 2 weeks. She takes Ativan scheduled three times daily as well.  She states she always has anxiety and worries about when her sitter is coming. .  5. OAB (overactive bladder) Off myrbetriq, denies bladder issues  6. Slow transit constipation Reports normal bms  7. Essential hypertension Off BP meds BP controlled   Functional status: hoyer lift, intermittently incontinent   Past Medical History:  Diagnosis Date  . Acute MI ([place]) 08/10/2012   2D Echo - EF 55-60%, normal  . Alcohol dependence ([place]) 11/15/2012  . Anemia 11/15/2012  . Anginal pain ([place]) 2014  . Anxiety 2013  . Arthritis   . Atherosclerosis 2015  . B12 deficiency 12/01/2012  . Bradycardia, on admit 08/09/2012  . Breast cancer ([place])   . CAD (coronary artery disease)   . Cancer (H[facility])    hx of breast cancer  . Cardiomegaly 2015  . Cerebral atrophy 2015  .  Cerebrovascular disease 2015   Small vessel disease  . Closed pelvic fracture ([place]) 11/11/2012  . Constipation 2015  . Coronary artery disease 2014  . Depression   . DVT (deep vein thrombosis) in pregnancy   . DVT (deep venous thrombosis) ([place]) 2011    Eliquis anticoagulation  . Fracture of right superior pubic ramus ([place]) 2011   Superior and inferior pubic rami fractures  . GERD (gastroesophageal reflux disease)   . Heart attack ([place]) "two years ago"  . History of fall 2014  . HLD  (hyperlipidemia)   . HTN (hypertension)   . Hyperlipemia 2014  . Insomnia 12/01/2012  . Intertrochanteric fracture of left hip ([place])  11/16/48  . L5 vertebral fracture ([place]) 11/15/2012   Fracture of the L5 left transverse process.    . Left foot drop   . Long term (current) use of anticoagulants 11/15/2012  . Lumbar vertebral fracture (H[facility]) 06/15/2013   L3-4 12/15/20143  . Osteoarthritis 02/10/2013  . Pacemaker   . PE (pulmonary embolism)   . Pulmonary embolism [facility]) Jan 2012   Eliquis anticoagulation  . Right foot drop 2014  . Right radial fracture 2014  . Sacral fracture, closed ([place]) 11/15/2012   Bilateral nondisplaced sacral fractures.   Marland Kitchen Spinal stenosis   . Unspecified vitamin D deficiency 12/07/2012  . Unstable gait  2014  . Urinary tract infection 2015   Past Surgical History:  Procedure Laterality Date  . ABDOMINAL HYSTERECTOMY    . CARDIAC CATHETERIZATION  08/08/2012   2.5x41mm Emerge balloon predilation, 2.75x46mm VeriFLEX bare-metal stent was inserted into the RCA to cover proximal to mid segments of RCA deployed with Noncompliant Sprinter balloon 2.75x41mm up to 2.44mm dilated  . CATARACT EXTRACTION Bilateral   . ELBOW SURGERY    . HIP FRACTURE SURGERY    . INTRAMEDULLARY (IM) NAIL INTERTROCHANTERIC Left 11/18/2013   Procedure: IM NAIL  LEFT HIP;  Surgeon: Rozanna Box, MD;  Location: [facility];  Service: Orthopedics;  Laterality: Left;  . LEFT HEART CATHETERIZATION WITH CORONARY ANGIOGRAM N/A 08/08/2012   Procedure: LEFT HEART CATHETERIZATION WITH CORONARY ANGIOGRAM;  Surgeon: Troy Sine, MD;  Location[facility] C[facility] LAB;  Service: Cardiovascular;  Laterality: N/A;  . ORIF WRIST FRACTURE Right 07/03/2013   Procedure: OPEN REDUCTION INTERNAL FIXATION (ORIF) RIGHT WRIST FRACTURE;  Surgeon: Linna Hoff, MD;  Location: [facility];  Service: Orthopedics;  Laterality: Right;  . PERCUTANEOUS CORONARY STENT INTERVENTION (PCI-S) N/A 08/08/2012   Procedure: PERCUTANEOUS CORONARY STENT  INTERVENTION (PCI-S);  Surgeon: Troy Sine, MD;  Location: [facility] CATH LAB;  Service: Cardiovascular;  Laterality: N/A;    Allergies  Allergen Reactions  . Aspirin Other (See Comments)    taking xarelto  . Codeine Nausea And Vomiting  . Aspirin Other (See Comments)    Pt taking xarelto   . Benadryl [Diphenhydramine] Other (See Comments)    unknown  . Codeine Other (See Comments)    unknown  . Pneumococcal Vaccines Other (See Comments)    unknown  . Benadryl [Diphenhydramine Hcl] Other (See Comments)    unknown  . Flu Virus Vaccine Other (See Comments)    unknown  . Penicillins Rash  . Penicillins Rash  . Pneumococcal Vaccines Other (See Comments)    unknown      Medication List       Accurate as of 03/18/16  4:43 PM. Always use your most recent med list.          acetaminophen 325 MG tablet  Commonly known as:  TYLENOL Take 650 mg by mouth 3 (three) times daily.   busPIRone 30 MG tablet Commonly known as:  BUSPAR Take 30 mg by mouth 2 (two) times daily.   feeding supplement Liqd Take 1 Container by mouth every morning.   hydrOXYzine 25 MG tablet Commonly known as:  ATARAX/VISTARIL Take 1 tablet (25 mg total) by mouth every 8 (eight) hours as needed.   loperamide 2 MG tablet Commonly known as:  IMODIUM A-D Take 2 mg by mouth. Take 2 mg four times daily as needed for diarrhea or loose stools   LORazepam 0.5 MG tablet Commonly known as:  ATIVAN Take 0.5 by mouth at lunch, 0.5 by mouth at bedtime and 0.5 by mouth every 6 hours as needed for anxiety   LORazepam 1 MG tablet Commonly known as:  ATIVAN 1/2 by mouth every 6 hours as needed for anxiety   Melatonin 3 MG Tabs Take 3 mg by mouth daily.   nitroGLYCERIN 0.4 MG SL tablet Commonly known as:  NITROSTAT Place 1 tablet (0.4 mg total) under the tongue every 5 (five) minutes x 3 doses as needed for chest pain.   oxyCODONE-acetaminophen 5-325 MG tablet Commonly known as:  PERCOCET/ROXICET Take one tablet  by mouth every 4 hours as needed for pain scale 1-5; Take two tablets by mouth every 4 hours as needed for pain scale 6-10   QUEtiapine 50 MG tablet Commonly known as:  SEROQUEL Take 50 mg by mouth every morning.   senna-docusate 8.6-50 MG tablet Commonly known as:  Senokot-S Take 2 tablets by mouth at bedtime.   sertraline 100 MG tablet Commonly known as:  ZOLOFT Take 150 mg by mouth daily.   temazepam 15 MG capsule Commonly known as:  RESTORIL Take 1 capsule (15 mg total) by mouth at bedtime.       Review of Systems  Constitutional: Negative for activity change, appetite change, fatigue and fever.  HENT: Negative for congestion and trouble swallowing.   Respiratory: Negative for apnea, cough, choking and shortness of breath.   Cardiovascular: Negative for chest pain, palpitations and leg swelling.  Gastrointestinal: Negative for abdominal distention, constipation, nausea and vomiting.  Genitourinary: Negative for difficulty urinating and dysuria.  Musculoskeletal: Positive for arthralgias and gait problem. Negative for back pain, joint swelling, neck pain and neck stiffness.       Chronic R arm pain  Neurological: Negative for dizziness, tremors and headaches.  Psychiatric/Behavioral: Positive for behavioral problems and confusion. Negative for decreased concentration, dysphoric mood, hallucinations, self-injury and sleep disturbance. The patient is nervous/anxious. The patient is not hyperactive.     Immunization History  Administered Date(s) Administered  . Tdap 07/02/2015   Pertinent  Health Maintenance Due  Topic Date Due  . DEXA SCAN  02/26/1984  . PNA vac Low Risk Adult (1 of 2 - PCV13) 02/26/1984  . INFLUENZA VACCINE  01/30/2016   Fall Risk  03/23/2015 12/15/2014  Falls in the past year? Yes No  Number falls in past yr: 1 -  Risk for fall due to : History of fall(s) Impaired balance/gait   Functional Status Survey:    Vitals:   03/18/16 1608  BP: (!)  145/80  Pulse: 62  Resp: 16  Temp: 97.8 F (36.6 C)  SpO2: 94%  Weight: 108 lb 12.8 oz (49.4 kg)   Body mass index is 19.27 kg/m.  Wt Readings from Last 3 Encounters:  03/18/16 108 lb 12.8 oz (49.4 kg)  02/15/16 103  lb 6.4 oz (46.9 kg)  12/04/15 100 lb (45.4 kg)    Physical Exam  Constitutional: No distress.  HENT:  Head: Normocephalic and atraumatic.  Mouth/Throat: Oropharynx is clear and moist.  Eyes: Conjunctivae are normal. Pupils are equal, round, and reactive to light. Right eye exhibits no discharge. Left eye exhibits no discharge.  Neck: Normal range of motion. Neck supple. No JVD present.  Cardiovascular: Normal rate and regular rhythm.   No murmur heard. Pulmonary/Chest: Effort normal and breath sounds normal. No respiratory distress. She has no wheezes. She exhibits no tenderness.  Abdominal: Soft. Bowel sounds are normal. She exhibits no distension. There is no tenderness.  Musculoskeletal: She exhibits no edema or tenderness.  Right wrist drop, right foot drop. She is wearing a brace to R upper arm.   Lymphadenopathy:    She has no cervical adenopathy.  Neurological: She is alert.  Oriented to her self and situation only  Skin: Skin is warm and dry. She is not diaphoretic.  Psychiatric: She has a normal mood and affect.  Nursing note and vitals reviewed.   Labs reviewed:  Recent Labs  06/27/15 2354 06/28/15 0600 08/15/15 0755  NA 139 139 141  K 3.5 3.6 3.7  CL 107 107  --   CO2 19* 24  --   GLUCOSE 169* 120*  --   BUN 14 14 17   CREATININE 0.93 0.90 0.6  CALCIUM 8.7* 8.6*  --     Recent Labs  06/28/15 0600 08/15/15 0755  AST 18 17  ALT 13* 12  ALKPHOS 77 114  BILITOT 0.6  --   PROT 5.2*  --   ALBUMIN 3.2*  --     Recent Labs  06/27/15 2354 06/28/15 0600 07/04/15 07/11/15 08/15/15 0755  WBC 11.7* 9.8 8.2 8.5 6.8  NEUTROABS 10.2*  --   --   --   --   HGB 12.4 11.1* 8.6* 9.4* 11.9*  HCT 38.4 33.7* 26* 28* 37  MCV 97.0 96.8  --   --    --   PLT 183 180 266  --  255   Lab Results  Component Value Date   TSH 2.082 08/10/2012   Lab Results  Component Value Date   HGBA1C 6.1 (H) 08/10/2012   Lab Results  Component Value Date   CHOL  07/26/2010    191        ATP III CLASSIFICATION:  <200     mg/dL   Desirable  200-239  mg/dL   Borderline High  >=240    mg/dL   High          HDL 93 07/26/2010   LDLCALC  07/26/2010    83        Total Cholesterol/HDL:CHD Risk Coronary Heart Disease Risk Table                     Men   Women  1/2 Average Risk   3.4   3.3  Average Risk       5.0   4.4  2 X Average Risk   9.6   7.1  3 X Average Risk  23.4   11.0        Use the calculated Patient Ratio above and the CHD Risk Table to determine the patient's CHD Risk.        ATP III CLASSIFICATION (LDL):  <100     mg/dL   Optimal  100-129  mg/dL   Near or  Above                    Optimal  130-159  mg/dL   Borderline  160-189  mg/dL   High  >190     mg/dL   Very High   TRIG 77 07/26/2010   CHOLHDL 2.1 07/26/2010    Significant Diagnostic Results in last 30 days:  No results found.  Assessment/Plan  1. Dementia in Alzheimer's disease with late onset Progressive memory loss Would not benefit from memory meds  2. Depression Stable Continue current regimen  3. Insomnia Continue restoril and melatonin  4. Generalized anxiety disorder Continues to have periods of anxiety Consider increasing seroquel Will defer to psych, nsg manager to speak with them  5. OAB (overactive bladder) Stable  6. Slow transit constipation Continue Senokot 2 tabs qhs  7. Essential hypertension Controlled, no meds  Goals of care are comfort based. No hospitalizations, tube feeding, etc.     Family/ staff Communication: discussed with nurse  Cindi Carbon, [place] [phone]

## 2016-03-28 ENCOUNTER — Other Ambulatory Visit: Payer: Self-pay | Admitting: *Deleted

## 2016-03-28 MED ORDER — OXYCODONE-ACETAMINOPHEN 5-325 MG PO TABS: ORAL_TABLET | ORAL | 0 refills | Status: AC

## 2016-03-28 NOTE — Telephone Encounter (Signed)
[facility] #[phone] Fax: [phone]  

## 2016-04-12 ENCOUNTER — Non-Acute Institutional Stay (SKILLED_NURSING_FACILITY): Payer: Medicare Other | Admitting: Adult Health

## 2016-04-12 ENCOUNTER — Encounter: Payer: Self-pay | Admitting: Adult Health

## 2016-04-12 DIAGNOSIS — F0281 Dementia in other diseases classified elsewhere with behavioral disturbance: Secondary | ICD-10-CM

## 2016-04-12 DIAGNOSIS — K5901 Slow transit constipation: Secondary | ICD-10-CM

## 2016-04-12 DIAGNOSIS — G301 Alzheimer's disease with late onset: Secondary | ICD-10-CM

## 2016-04-12 DIAGNOSIS — F02818 Dementia in other diseases classified elsewhere, unspecified severity, with other behavioral disturbance: Secondary | ICD-10-CM

## 2016-04-12 DIAGNOSIS — F03918 Unspecified dementia, unspecified severity, with other behavioral disturbance: Secondary | ICD-10-CM | POA: Insufficient documentation

## 2016-04-12 DIAGNOSIS — M25521 Pain in right elbow: Secondary | ICD-10-CM | POA: Diagnosis not present

## 2016-04-12 DIAGNOSIS — F5101 Primary insomnia: Secondary | ICD-10-CM

## 2016-04-12 DIAGNOSIS — F0391 Unspecified dementia with behavioral disturbance: Secondary | ICD-10-CM | POA: Insufficient documentation

## 2016-04-12 DIAGNOSIS — F411 Generalized anxiety disorder: Secondary | ICD-10-CM | POA: Diagnosis not present

## 2016-04-12 NOTE — Progress Notes (Signed)
Patient ID: Cathy Wallace, female   DOB: Jan 25, 1917, 82 y.o.   MRN: [id number]  Location:   [facility]:  SNF (31)  Provider:   Cindi Carbon, ANP [place] [phone]   REED, Jonelle Sidle, DO  [name], DO as PCP - General (Geriatric Medicine) [facility]  Extended Emergency Contact Information Primary Emergency Contact: Nadel,Beverly Address: [place] [address] Johnnette Litter of [place] Phone: [phone] Work Phone: [phone] Mobile Phone: [phone] Relation: Daughter Secondary Emergency Contact: Grape,Larry Address: [address]          [place],  [address] Johnnette Litter of [place] Phone: [phone] Work Phone: [phone] Relation: Other  Code Status:  DNR Goals of care: Advanced Directive information Advanced Directives 02/15/2016  Does patient have an advance directive? Yes  Type of Advance Directive Out of facility DNR (pink MOST or yellow form);[place];Living will  Does patient want to make changes to advanced directive? -  Copy of advanced directive(s) in chart? Yes  Pre-existing out of facility DNR order (yellow form or pink MOST form) Pink MOST form placed in chart (order not valid for inpatient use)     Chief Complaint  Patient presents with  . Medical Management of Chronic Issues    HPI:  Cathy Wallace is a 82 y.o. female seen today for management of chronic conditions. She resides in skilled care due progressive functional losses from right foot drop, dementia, and weakness.Her goals of care are comfort based and she is followed by hospice.   She has had issues with agitation and frequent yelling out for help despite having private caregivers for a portion of the day. She was started on Seroquel 50 mg qam earlier this week and is now rather sleepy and not able to feed herself.    The hospice nurse reports that she has reported pain to her legs and right arm (previous non healing humerus fx).  They are requesting scheduled norco, as scheduled tylenol has not helped.   She has a good appetite per the staff, some increased edema in her hands and has gained 10 lbs in the past 2 months. No sob or cp.    Functional status: hoyer lift, intermittently incontinent   Past Medical History:  Diagnosis Date  . Acute MI 08/10/2012   2D Echo - EF 55-60%, normal  . Alcohol dependence ([place]) 11/15/2012  . Anemia 11/15/2012  . Anginal pain ([place]) 2014  . Anxiety 2013  . Arthritis   . Atherosclerosis 2015  . B12 deficiency 12/01/2012  . Bradycardia, on admit 08/09/2012  . Breast cancer ([place])   . CAD (coronary artery disease)   . Cancer (HCC)    hx of breast cancer  . Cardiomegaly 2015  . Cerebral atrophy 2015  . Cerebrovascular disease 2015   Small vessel disease  . Closed pelvic fracture ([place]) 11/11/2012  . Constipation 2015  . Coronary artery disease 2014  . Depression   . DVT (deep vein thrombosis) in pregnancy ([place])   . DVT (deep venous thrombosis) ([place]) 2011    Eliquis anticoagulation  . Fracture of right superior pubic ramus ([place]) 2011   Superior and inferior pubic rami fractures  . GERD (gastroesophageal reflux disease)   . Heart attack "two years ago"  . History of fall 2014  . HLD (hyperlipidemia)   .  HTN (hypertension)   . Hyperlipemia 2014  . Insomnia 12/01/2012  . Intertrochanteric fracture of left hip ([place])  11/16/48  . L5 vertebral fracture ([place]) 11/15/2012   Fracture of the L5 left transverse process.    . Left foot drop   . Long term (current) use of anticoagulants 11/15/2012  . Lumbar vertebral fracture (HCC) 06/15/2013   L3-4 12/15/20143  . Osteoarthritis 02/10/2013  . Pacemaker   . PE (pulmonary embolism)   . Pulmonary embolism [facility]) Jan 2012   Eliquis anticoagulation  . Right foot drop 2014  . Right radial fracture 2014  . Sacral fracture,  closed ([place]) 11/15/2012   Bilateral nondisplaced sacral fractures.   Marland Kitchen Spinal stenosis   . Unspecified vitamin D deficiency 12/07/2012  . Unstable gait  2014  . Urinary tract infection 2015   Past Surgical History:  Procedure Laterality Date  . ABDOMINAL HYSTERECTOMY    . CARDIAC CATHETERIZATION  08/08/2012   2.5x82mm Emerge balloon predilation, 2.75x51mm VeriFLEX bare-metal stent was inserted into the RCA to cover proximal to mid segments of RCA deployed with Noncompliant Sprinter balloon 2.75x64mm up to 2.7mm dilated  . CATARACT EXTRACTION Bilateral   . ELBOW SURGERY    . HIP FRACTURE SURGERY    . INTRAMEDULLARY (IM) NAIL INTERTROCHANTERIC Left 11/18/2013   Procedure: IM NAIL  LEFT HIP;  Surgeon: Rozanna Box, MD;  Location: [facility];  Service: Orthopedics;  Laterality: Left;  . LEFT HEART CATHETERIZATION WITH CORONARY ANGIOGRAM N/A 08/08/2012   Procedure: LEFT HEART CATHETERIZATION WITH CORONARY ANGIOGRAM;  Surgeon: Troy Sine, MD;  Location: [facility] CATH LAB;  Service: Cardiovascular;  Laterality: N/A;  . ORIF WRIST FRACTURE Right 07/03/2013   Procedure: OPEN REDUCTION INTERNAL FIXATION (ORIF) RIGHT WRIST FRACTURE;  Surgeon: Linna Hoff, MD;  Location: [facility];  Service: Orthopedics;  Laterality: Right;  . PERCUTANEOUS CORONARY STENT INTERVENTION (PCI-S) N/A 08/08/2012   Procedure: PERCUTANEOUS CORONARY STENT INTERVENTION (PCI-S);  Surgeon: Troy Sine, MD;  Location[facility] C[facility] LAB;  Service: Cardiovascular;  Laterality: N/A;    Allergies  Allergen Reactions  . Aspirin Other (See Comments)    taking xarelto  . Codeine Nausea And Vomiting  . Aspirin Other (See Comments)    Pt taking xarelto   . Benadryl [Diphenhydramine] Other (See Comments)    unknown  . Codeine Other (See Comments)    unknown  . Pneumococcal Vaccines Other (See Comments)    unknown  . Benadryl [Diphenhydramine Hcl] Other (See Comments)    unknown  . Flu Virus Vaccine Other (See Comments)    unknown  . Penicillins  Rash  . Penicillins Rash  . Pneumococcal Vaccines Other (See Comments)    unknown      Medication List       Accurate as of 04/12/16 11:38 AM. Always use your most recent med list.          acetaminophen 325 MG tablet Commonly known as:  TYLENOL Take 650 mg by mouth 3 (three) times daily.   busPIRone 30 MG tablet Commonly known as:  BUSPAR Take 30 mg by mouth 2 (two) times daily.   clonazePAM 1 MG tablet Commonly known as:  KLONOPIN Take 1 mg by mouth daily. 2pm   feeding supplement Liqd Take 1 Container by mouth every morning.   hydrOXYzine 25 MG tablet Commonly known as:  ATARAX/VISTARIL Take 1 tablet (25 mg total) by mouth every 8 (eight) hours as needed.   loperamide 2 MG tablet Commonly known as:  IMODIUM A-D Take 2 mg by mouth. Take 2 mg four times daily as needed for diarrhea or loose stools   LORazepam 0.5 MG tablet Commonly known as:  ATIVAN Take 0.5 by mouth at lunch, 0.5 by mouth at bedtime and 0.5 by mouth every 6 hours as needed for anxiety   LORazepam 1 MG tablet Commonly known as:  ATIVAN 1/2 by mouth every 6 hours as needed for anxiety   Melatonin 3 MG Tabs Take 3 mg by mouth daily.   nitroGLYCERIN 0.4 MG SL tablet Commonly known as:  NITROSTAT Place 1 tablet (0.4 mg total) under the tongue every 5 (five) minutes x 3 doses as needed for chest pain.   oxyCODONE-acetaminophen 5-325 MG tablet Commonly known as:  PERCOCET/ROXICET Take one tablet by mouth every 4 hours as needed for pain scale 1-5; Take two tablets by mouth every 4 hours as needed for pain scale 6-10   QUEtiapine 50 MG tablet Commonly known as:  SEROQUEL Take 50 mg by mouth every morning.   senna-docusate 8.6-50 MG tablet Commonly known as:  Senokot-S Take 2 tablets by mouth at bedtime.   sertraline 100 MG tablet Commonly known as:  ZOLOFT Take 150 mg by mouth daily.   temazepam 15 MG capsule Commonly known as:  RESTORIL Take 1 capsule (15 mg total) by mouth at  bedtime.       Review of Systems  Unable to perform ROS: Dementia    Immunization History  Administered Date(s) Administered  . Tdap 07/02/2015   Pertinent  Health Maintenance Due  Topic Date Due  . DEXA SCAN  02/26/1984  . PNA vac Low Risk Adult (1 of 2 - PCV13) 02/26/1984  . INFLUENZA VACCINE  01/30/2016   Fall Risk  03/23/2015 12/15/2014  Falls in the past year? Yes No  Number falls in past yr: 1 -  Risk for fall due to : History of fall(s) Impaired balance/gait   Functional Status Survey:    Vitals:   04/12/16 1128  BP: (!) 120/56  Pulse: 66  Resp: 20  Temp: 97.3 F (36.3 C)  SpO2: 98%  Weight: 113 lb (51.3 kg)   Body mass index is 20.02 kg/m.  Wt Readings from Last 3 Encounters:  04/12/16 113 lb (51.3 kg)  03/18/16 108 lb 12.8 oz (49.4 kg)  02/15/16 103 lb 6.4 oz (46.9 kg)    Physical Exam  Constitutional: No distress.  HENT:  Head: Normocephalic and atraumatic.  Mouth/Throat: Oropharynx is clear and moist.  Eyes: Conjunctivae are normal. Pupils are equal, round, and reactive to light. Right eye exhibits no discharge. Left eye exhibits no discharge.  Neck: Normal range of motion. Neck supple. No JVD present.  Cardiovascular: Normal rate and regular rhythm.   No murmur heard. Trace edema ot both arms, face puffy  Pulmonary/Chest: Effort normal and breath sounds normal. No respiratory distress. She has no wheezes. She exhibits no tenderness.  Abdominal: Soft. Bowel sounds are normal. She exhibits no distension. There is no tenderness.  Musculoskeletal: She exhibits no edema or tenderness.  Right wrist drop, right foot drop. She is wearing a brace to R upper arm.   Lymphadenopathy:    She has no cervical adenopathy.  Neurological: She is alert.  Oriented to her self and situation only. Able to follow commands  Skin: Skin is warm and dry. She is not diaphoretic.  Psychiatric: She has a normal mood and affect.  sleepy  Nursing note and vitals  reviewed.  Labs reviewed:  Recent Labs  06/27/15 2354 06/28/15 0600 08/15/15 0755  NA 139 139 141  K 3.5 3.6 3.7  CL 107 107  --   CO2 19* 24  --   GLUCOSE 169* 120*  --   BUN 14 14 17   CREATININE 0.93 0.90 0.6  CALCIUM 8.7* 8.6*  --     Recent Labs  06/28/15 0600 08/15/15 0755  AST 18 17  ALT 13* 12  ALKPHOS 77 114  BILITOT 0.6  --   PROT 5.2*  --   ALBUMIN 3.2*  --     Recent Labs  06/27/15 2354 06/28/15 0600 07/04/15 07/11/15 08/15/15 0755  WBC 11.7* 9.8 8.2 8.5 6.8  NEUTROABS 10.2*  --   --   --   --   HGB 12.4 11.1* 8.6* 9.4* 11.9*  HCT 38.4 33.7* 26* 28* 37  MCV 97.0 96.8  --   --   --   PLT 183 180 266  --  255   Lab Results  Component Value Date   TSH 2.082 08/10/2012   Lab Results  Component Value Date   HGBA1C 6.1 (H) 08/10/2012   Lab Results  Component Value Date   CHOL  07/26/2010    191        ATP III CLASSIFICATION:  <200     mg/dL   Desirable  200-239  mg/dL   Borderline High  >=240    mg/dL   High          HDL 93 07/26/2010   LDLCALC  07/26/2010    83        Total Cholesterol/HDL:CHD Risk Coronary Heart Disease Risk Table                     Men   Women  1/2 Average Risk   3.4   3.3  Average Risk       5.0   4.4  2 X Average Risk   9.6   7.1  3 X Average Risk  23.4   11.0        Use the calculated Patient Ratio above and the CHD Risk Table to determine the patient's CHD Risk.        ATP III CLASSIFICATION (LDL):  <100     mg/dL   Optimal  100-129  mg/dL   Near or Above                    Optimal  130-159  mg/dL   Borderline  160-189  mg/dL   High  >190     mg/dL   Very High   TRIG 77 07/26/2010   CHOLHDL 2.1 07/26/2010    Significant Diagnostic Results in last 30 days:  No results found.  Assessment/Plan   1. Late onset Alzheimer's disease with behavioral disturbance Change seroquel to 25 mg BID as 50 mg in the am is rather sedating for her  2. Generalized anxiety disorder D/C ativan, seems to be  tolerant to it Klonopin 1 mg TID D/C Buspar and Vistaril  3. Primary insomnia D/C melatonin (ineffecive) Continue temazepam 15 mg qhs  4. Slow transit constipation Increase senna s 2 tabs BID  5. Arthralgia of right upper arm Scheduled Norco 1 tab BID and continue prn dosing D/c scheduled tylenol  These orders were made in collaboration with Dr. Casimiro Needle with psych, the DON, and the hospice nurse. We are trying to lessen her med list and  focus on her comfort. She has been screaming in her room repeatedly and has delirium and anxiety. While she was comfortable for my visit, she was rather sedated but has periods of extreme agitation. We will monitor for response to therapy and adjust as indicated in conjunction with Psych.    Cindi Carbon, ANP [facility] [phone]

## 2016-04-14 ENCOUNTER — Emergency Department (HOSPITAL_COMMUNITY)

## 2016-04-14 ENCOUNTER — Emergency Department (HOSPITAL_COMMUNITY)
Admission: EM | Admit: 2016-04-14 | Discharge: 2016-04-14 | Disposition: A | Discharge status: Home / Self Care | Attending: Emergency Medicine | Admitting: Emergency Medicine

## 2016-04-14 DIAGNOSIS — S0990XA Unspecified injury of head, initial encounter: Secondary | ICD-10-CM

## 2016-04-14 DIAGNOSIS — Y92129 Unspecified place in nursing home as the place of occurrence of the external cause: Secondary | ICD-10-CM | POA: Insufficient documentation

## 2016-04-14 DIAGNOSIS — Z853 Personal history of malignant neoplasm of breast: Secondary | ICD-10-CM | POA: Diagnosis not present

## 2016-04-14 DIAGNOSIS — Z950 Presence of cardiac pacemaker: Secondary | ICD-10-CM | POA: Insufficient documentation

## 2016-04-14 DIAGNOSIS — Y999 Unspecified external cause status: Secondary | ICD-10-CM | POA: Diagnosis not present

## 2016-04-14 DIAGNOSIS — I252 Old myocardial infarction: Secondary | ICD-10-CM | POA: Diagnosis not present

## 2016-04-14 DIAGNOSIS — S0083XA Contusion of other part of head, initial encounter: Secondary | ICD-10-CM | POA: Diagnosis not present

## 2016-04-14 DIAGNOSIS — S12100A Unspecified displaced fracture of second cervical vertebra, initial encounter for closed fracture: Secondary | ICD-10-CM | POA: Insufficient documentation

## 2016-04-14 DIAGNOSIS — Z79899 Other long term (current) drug therapy: Secondary | ICD-10-CM | POA: Insufficient documentation

## 2016-04-14 DIAGNOSIS — W228XXA Striking against or struck by other objects, initial encounter: Secondary | ICD-10-CM | POA: Diagnosis not present

## 2016-04-14 DIAGNOSIS — Z8673 Personal history of transient ischemic attack (TIA), and cerebral infarction without residual deficits: Secondary | ICD-10-CM | POA: Insufficient documentation

## 2016-04-14 DIAGNOSIS — I2510 Atherosclerotic heart disease of native coronary artery without angina pectoris: Secondary | ICD-10-CM | POA: Insufficient documentation

## 2016-04-14 DIAGNOSIS — I10 Essential (primary) hypertension: Secondary | ICD-10-CM | POA: Insufficient documentation

## 2016-04-14 DIAGNOSIS — W19XXXA Unspecified fall, initial encounter: Secondary | ICD-10-CM

## 2016-04-14 DIAGNOSIS — Y939 Activity, unspecified: Secondary | ICD-10-CM | POA: Insufficient documentation

## 2016-04-14 MED ORDER — MORPHINE SULFATE (PF) 2 MG/ML IV SOLN
2.0000 mg | Freq: Once | INTRAVENOUS | Status: AC
  Administered 2016-04-14: 2 mg via INTRAMUSCULAR
  Filled 2016-04-14: qty 1

## 2016-04-14 MED ORDER — TETANUS-DIPHTH-ACELL PERTUSSIS 5-2.5-18.5 LF-MCG/0.5 IM SUSP
0.5000 mL | Freq: Once | INTRAMUSCULAR | Status: DC
  Filled 2016-04-14: qty 0.5

## 2016-04-14 MED ORDER — HYDROCODONE-ACETAMINOPHEN 5-325 MG PO TABS
1.0000 | ORAL_TABLET | Freq: Once | ORAL | Status: DC
  Filled 2016-04-14: qty 1

## 2016-04-14 NOTE — ED Notes (Signed)
ptar at bedside. 

## 2016-04-14 NOTE — ED Notes (Signed)
Patient transported to CT 

## 2016-04-14 NOTE — ED Triage Notes (Addendum)
Pt is coming from [facility]. Pt slide out of her lift recliner this am when aid walked out of the room. Aid immediately returned to find the pt on the floor. Pt hit head against the floor.  Pt has a pacemaker. Pt has old right arm fracture due to pt not be a candidate for surgery.  Pt is yelling now but via EMS this is pt's normal.  Pt has DNR papers in room.  Pt has hemotoma to forehead.  217/153 automatic 87 Pacemaker  96%

## 2016-04-14 NOTE — ED Notes (Signed)
Upon discharge, pts temp found to be 100.5. Dr Lita Mains made aware and advised this RN that family requested no further treatment of patient and wants patient tranferred [facility] to [facility].

## 2016-04-14 NOTE — ED Notes (Signed)
DR. Lita Mains made aware that patient has DNR and pink Most form at bedside

## 2016-04-14 NOTE — ED Notes (Addendum)
Pt's Son (Who is the POA) refused Tdap shot. This RN informed Son about Tdap shot and Son stated "I dont think she'll be needing that."

## 2016-04-14 NOTE — ED Provider Notes (Signed)
[facility] DEPT Provider Note   CSN: [id number] Arrival date & time: 04/14/16  [id number]     History   Chief Complaint Chief Complaint  Patient presents with  . Fall    HPI Cathy Wallace is a 81 y.o. female.  HPI Level V caveat. Patient with advanced dementia coming from nursing home by EMS. Per EMS patient slipped from her recliner and struck her head. No extended loss of consciousness. Patient's been screaming out since their arrival. Per EMS this is a shunt baseline per nursing home staff. Vital signs stable in route. Patient on backboard with towel for cervical stabilization. Past Medical History:  Diagnosis Date  . Acute MI 08/10/2012   2D Echo - EF 55-60%, normal  . Alcohol dependence ([place]) 11/15/2012  . Anemia 11/15/2012  . Anginal pain ([place]) 2014  . Anxiety 2013  . Arthritis   . Atherosclerosis 2015  . B12 deficiency 12/01/2012  . Bradycardia, on admit 08/09/2012  . Breast cancer ([place])   . CAD (coronary artery disease)   . Cancer (HCC)    hx of breast cancer  . Cardiomegaly 2015  . Cerebral atrophy 2015  . Cerebrovascular disease 2015   Small vessel disease  . Closed pelvic fracture ([place]) 11/11/2012  . Constipation 2015  . Coronary artery disease 2014  . Depression   . DVT (deep vein thrombosis) in pregnancy ([place])   . DVT (deep venous thrombosis) ([place]) 2011    Eliquis anticoagulation  . Fracture of right superior pubic ramus ([place]) 2011   Superior and inferior pubic rami fractures  . GERD (gastroesophageal reflux disease)   . Heart attack "two years ago"  . History of fall 2014  . HLD (hyperlipidemia)   . HTN (hypertension)   . Hyperlipemia 2014  . Insomnia 12/01/2012  . Intertrochanteric fracture of left hip ([place])  11/16/48  . L5 vertebral fracture ([place]) 11/15/2012   Fracture of the L5 left transverse process.    . Left foot drop   . Long term (current) use of anticoagulants 11/15/2012  . Lumbar vertebral fracture (HCC) 06/15/2013   L3-4 12/15/20143  .  Osteoarthritis 02/10/2013  . Pacemaker   . PE (pulmonary embolism)   . Pulmonary embolism [facility]) Jan 2012   Eliquis anticoagulation  . Right foot drop 2014  . Right radial fracture 2014  . Sacral fracture, closed ([place]) 11/15/2012   Bilateral nondisplaced sacral fractures.   Marland Kitchen Spinal stenosis   . Unspecified vitamin D deficiency 12/07/2012  . Unstable gait  2014  . Urinary tract infection 2015    Patient Active Problem List   Diagnosis Date Noted  . Dementia with behavioral disturbance 04/12/2016  . Dry eyes 01/24/2016  . Constipation 12/04/2015  . HOH (hard of hearing) 08/16/2015  . HLD (hyperlipidemia)   . PE (pulmonary embolism)   . Spinal stenosis   . Pacemaker   . OAB (overactive bladder) 11/03/2014  . Hypokalemia 06/27/2014  . Ankle contracture 05/17/2014  . Advanced care planning/counseling discussion 12/06/2013  . Healthcare maintenance 09/29/2013  . Right foot drop 06/22/2013  . Lumbar vertebral fracture 06/15/2013  . Depression 06/02/2013  . Osteoarthritis 02/10/2013  . Generalized anxiety disorder   . Vitamin D deficiency 12/07/2012  . B12 deficiency 12/01/2012  . Insomnia 12/01/2012  . Alcohol dependence ([place]) 11/15/2012  . Long term current use of anticoagulant therapy 11/15/2012  . Diverticulosis of colon with hemorrhage 11/12/2012  . Osteoporosis 11/12/2012  . GERD (gastroesophageal reflux disease) 11/12/2012  . Breast cancer (  [place]) 11/12/2012  . Lactose intolerance 11/12/2012  . Anemia 11/11/2012  . CAD (coronary artery disease), residual 30% septal stenosis 08/09/2012  . Complete heart block, transient with STEMI 08/09/2012  . STEMI (ST elevation myocardial infarction), to RCA with PCI 08/08/12 08/08/2012  . Pulmonary embolism, Jan 2012 08/08/2012  . DVT (deep venous thrombosis), 2011 08/08/2012  . HTN (hypertension) 08/08/2012  . Spinal stenosis 08/08/2012    Past Surgical History:  Procedure Laterality Date  . ABDOMINAL HYSTERECTOMY    . CARDIAC  CATHETERIZATION  08/08/2012   2.5x32mm Emerge balloon predilation, 2.75x21mm VeriFLEX bare-metal stent was inserted into the RCA to cover proximal to mid segments of RCA deployed with Noncompliant Sprinter balloon 2.75x25mm up to 2.33mm dilated  . CATARACT EXTRACTION Bilateral   . ELBOW SURGERY    . HIP FRACTURE SURGERY    . INTRAMEDULLARY (IM) NAIL INTERTROCHANTERIC Left 11/18/2013   Procedure: IM NAIL  LEFT HIP;  Surgeon: Rozanna Box, MD;  Location: [facility];  Service: Orthopedics;  Laterality: Left;  . LEFT HEART CATHETERIZATION WITH CORONARY ANGIOGRAM N/A 08/08/2012   Procedure: LEFT HEART CATHETERIZATION WITH CORONARY ANGIOGRAM;  Surgeon: Troy Sine, MD;  Location: [facility] C[facility] LAB;  Service: Cardiovascular;  Laterality: N/A;  . ORIF WRIST FRACTURE Right 07/03/2013   Procedure: OPEN REDUCTION INTERNAL FIXATION (ORIF) RIGHT WRIST FRACTURE;  Surgeon: Linna Hoff, MD;  Location: [facility];  Service: Orthopedics;  Laterality: Right;  . PERCUTANEOUS CORONARY STENT INTERVENTION (PCI-S) N/A 08/08/2012   Procedure: PERCUTANEOUS CORONARY STENT INTERVENTION (PCI-S);  Surgeon: Troy Sine, MD;  Location: [facility] CATH LAB;  Service: Cardiovascular;  Laterality: N/A;    OB History    Gravida Para Term Preterm AB Living   0 0 0 0 0     SAB TAB Ectopic Multiple Live Births   0 0 0           Home Medications    Prior to Admission medications   Medication Sig Start Date End Date Taking? Authorizing Provider  bisacodyl (DULCOLAX) 10 MG suppository Place 10 mg rectally as needed for moderate constipation.   Yes Historical Provider, MD  clonazePAM (KLONOPIN) 1 MG tablet Take 1 mg by mouth See admin instructions. Take 1 mg by mouth in the morning, take 1 mg by mouth at 1400 and take 1 mg by mouth at bedtime.   Yes Historical Provider, MD  feeding supplement (BOOST / RESOURCE BREEZE) LIQD Take 1 Container by mouth every morning.   Yes Historical Provider, MD  fluticasone (FLONASE) 50 MCG/ACT nasal spray Place 2  sprays into both nostrils daily.   Yes Historical Provider, MD  hydrOXYzine (ATARAX/VISTARIL) 25 MG tablet Take 1 tablet (25 mg total) by mouth every 8 (eight) hours as needed. Patient taking differently: Take 25 mg by mouth every 8 (eight) hours as needed for anxiety.  06/28/15  Yes Oswald Hillock, MD  loperamide (IMODIUM A-D) 2 MG tablet Take 4 mg by mouth daily as needed for diarrhea or loose stools.    Yes Historical Provider, MD  nitroGLYCERIN (NITROSTAT) 0.4 MG SL tablet Place 1 tablet (0.4 mg total) under the tongue every 5 (five) minutes x 3 doses as needed for chest pain. 08/12/12  Yes Brett Canales, PA-C  oxyCODONE-acetaminophen (PERCOCET/ROXICET) 5-325 MG tablet Take one tablet by mouth every 4 hours as needed for pain scale 1-5; Take two tablets by mouth every 4 hours as needed for pain scale 6-10 Patient taking differently: Take 1-2 tablets by mouth  See admin instructions. Take 1 tablet by mouth twice daily. Take 1 tablet every 4 hours as needed for 1-5 pain. Take 2 tablets by mouth every 4 hours as needed for pain rated 6-10. 03/28/16  Yes Tiffany L Reed, DO  QUEtiapine (SEROQUEL) 25 MG tablet Take 25 mg by mouth 2 (two) times daily.    Yes Historical Provider, MD  senna-docusate (SENNA-PLUS) 8.6-50 MG tablet Take 2 tablets by mouth 2 (two) times daily. Hold for loose stools   Yes Historical Provider, MD  sertraline (ZOLOFT) 100 MG tablet Take 150 mg by mouth daily.   Yes Historical Provider, MD  temazepam (RESTORIL) 15 MG capsule Take 1 capsule (15 mg total) by mouth at bedtime. 02/05/16  Yes Lauree Chandler, NP  acetaminophen (TYLENOL) 325 MG tablet Take 650 mg by mouth 3 (three) times daily.    Historical Provider, MD  LORazepam (ATIVAN) 1 MG tablet 1/2 by mouth every 6 hours as needed for anxiety Patient not taking: Reported on 04/14/2016 02/01/16   Gayland Curry, DO    Family History Family History  Problem Relation Age of Onset  . Heart attack Mother     Social History Social  History  Substance Use Topics  . Smoking status: Never Smoker  . Smokeless tobacco: Never Used  . Alcohol use Yes     Comment: 2 oz of scotch in evening     Allergies   Aspirin; Codeine; Aspirin; Benadryl [diphenhydramine hcl]; Benadryl [diphenhydramine]; Codeine; Flu virus vaccine; Penicillins; Pneumococcal vaccines; and Pneumococcal vaccines   Review of Systems Review of Systems  Unable to perform ROS: Dementia     Physical Exam Updated Vital Signs BP 200/69   Pulse 72   Resp 21   SpO2 97%   Physical Exam  Constitutional: She appears well-developed and well-nourished. She appears distressed.  Patient constantly crying out. No verbalization. Chronically ill-appearing/frail.  HENT:  Head: Normocephalic and atraumatic.  Mouth/Throat: Oropharynx is clear and moist.  Patient has contusion and mild abrasion to the right forehead. Matted bloody hair over the right temporal region. Suspect underlying laceration. No bony deformity. Midface appears stable. Dry mucous membranes.  Eyes: EOM are normal. Pupils are equal, round, and reactive to light.  Neck: Normal range of motion. Neck supple.  No perceived tenderness with palpation of the midline posterior cervical spine.  Cardiovascular: Normal rate and regular rhythm.   Pulmonary/Chest: Effort normal and breath sounds normal.  Abdominal: Soft. Bowel sounds are normal. There is no tenderness. There is no rebound and no guarding.  Musculoskeletal: Normal range of motion. She exhibits no edema or tenderness.  Patient has chronic right shoulder/proximal humerus deformity. Bilateral lower extremities are equal in length. Appears to tolerate ambulation of the bilateral hips. Distal pulses are intact. No definite midline thoracic or lumbar tenderness.  Neurological:  Patient is awake. Not answering questions or following commands. Appears to have movement in all extremities.  Skin: Skin is warm and dry. Capillary refill takes less than 2  seconds. No rash noted. No erythema.  Nursing note and vitals reviewed.    ED Treatments / Results  Labs (all labs ordered are listed, but only abnormal results are displayed) Labs Reviewed - No data to display  EKG  EKG Interpretation None       Radiology Ct Head Wo Contrast  Result Date: 04/14/2016 CLINICAL DATA:  Pain after fall EXAM: CT HEAD WITHOUT CONTRAST CT CERVICAL SPINE WITHOUT CONTRAST TECHNIQUE: Multidetector CT imaging of the head and cervical  spine was performed following the standard protocol without intravenous contrast. Multiplanar CT image reconstructions of the cervical spine were also generated. COMPARISON:  June 27, 2015 and June 14, 2013 FINDINGS: CT HEAD FINDINGS Brain: No subdural, epidural, or subarachnoid hemorrhage. The ventricles and sulci are prominent but stable. The cerebellum, brainstem, and basal cisterns are unchanged and unremarkable. Moderate to severe white matter changes persist without significant change. No acute cortical ischemia or infarct. No mass, mass effect, or midline shift. Vascular: Calcified atherosclerosis is seen in the intracranial carotid arteries. Skull: Normal. Negative for fracture or focal lesion. Sinuses/Orbits: There is fluid in the sphenoid sinus. Paranasal sinuses, mastoid air cells, and middle ears are otherwise normal. Other: There is a hematoma over the midline of the forehead. Extracranial soft tissues are otherwise normal. CT CERVICAL SPINE FINDINGS Alignment: Minimal anterolisthesis of C7 versus T1 is stable since 2014. No other malalignment. Skull base and vertebrae: Cortical disruption through the anterior odontoid process, just inferior to the anterior arch of C1. No adjacent soft tissue swelling. No extension seen through the posterior cortex. No other fractures identified. Soft tissues and spinal canal: No prevertebral fluid or swelling. No visible canal hematoma. Disc levels:  Multilevel degenerative changes. Upper  chest: Scarring in the left apex. Other: No other abnormalities. IMPRESSION: 1. No acute intracranial abnormality. 2. Cortical disruption through the anterior odontoid process. This is consistent with a fracture but is unusual given the lack of extension through the posterior cortex and the lack of displacement or adjacent soft tissue swelling. No other fractures or significant malalignment. Findings called to Dr.  Rutherford Nail Electronically Signed   By: Dorise Bullion III M.D   On: 04/14/2016 10:47   Ct Cervical Spine Wo Contrast  Result Date: 04/14/2016 CLINICAL DATA:  Pain after fall EXAM: CT HEAD WITHOUT CONTRAST CT CERVICAL SPINE WITHOUT CONTRAST TECHNIQUE: Multidetector CT imaging of the head and cervical spine was performed following the standard protocol without intravenous contrast. Multiplanar CT image reconstructions of the cervical spine were also generated. COMPARISON:  June 27, 2015 and June 14, 2013 FINDINGS: CT HEAD FINDINGS Brain: No subdural, epidural, or subarachnoid hemorrhage. The ventricles and sulci are prominent but stable. The cerebellum, brainstem, and basal cisterns are unchanged and unremarkable. Moderate to severe white matter changes persist without significant change. No acute cortical ischemia or infarct. No mass, mass effect, or midline shift. Vascular: Calcified atherosclerosis is seen in the intracranial carotid arteries. Skull: Normal. Negative for fracture or focal lesion. Sinuses/Orbits: There is fluid in the sphenoid sinus. Paranasal sinuses, mastoid air cells, and middle ears are otherwise normal. Other: There is a hematoma over the midline of the forehead. Extracranial soft tissues are otherwise normal. CT CERVICAL SPINE FINDINGS Alignment: Minimal anterolisthesis of C7 versus T1 is stable since 2014. No other malalignment. Skull base and vertebrae: Cortical disruption through the anterior odontoid process, just inferior to the anterior arch of C1. No adjacent soft  tissue swelling. No extension seen through the posterior cortex. No other fractures identified. Soft tissues and spinal canal: No prevertebral fluid or swelling. No visible canal hematoma. Disc levels:  Multilevel degenerative changes. Upper chest: Scarring in the left apex. Other: No other abnormalities. IMPRESSION: 1. No acute intracranial abnormality. 2. Cortical disruption through the anterior odontoid process. This is consistent with a fracture but is unusual given the lack of extension through the posterior cortex and the lack of displacement or adjacent soft tissue swelling. No other fractures or significant malalignment. Findings called to Dr.  Overton Electronically Signed   By: Dorise Bullion III M.D   On: 04/14/2016 10:47    Procedures Procedures (including critical care time)  Medications Ordered in ED Medications  Tdap (BOOSTRIX) injection 0.5 mL (0.5 mLs Intramuscular Not Given 04/14/16 1027)  HYDROcodone-acetaminophen (NORCO/VICODIN) 5-325 MG per tablet 1 tablet (not administered)  morphine 2 MG/ML injection 2 mg (2 mg Intramuscular Given 04/14/16 1126)     Initial Impression / Assessment and Plan / ED Course  I have reviewed the triage vital signs and the nursing notes.  Pertinent labs & imaging results that were available during my care of the patient were reviewed by me and considered in my medical decision making (see chart for details).  Clinical Course   Discussed CT findings of possible odontoid fracture with family who are at bedside. Son is patient's healthcare power of attorney. States that patient is going to receive only comfort care. Does not want even conservative treatment with cervical collar. Asking have the patient transferred back to nursing facility. They're aware that if the patient does have an odontoid fracture this is an unstable fracture and can progress to spinal cord compromise and possible death. Will discharge back to nursing home facility.   Final  Clinical Impressions(s) / ED Diagnoses   Final diagnoses:  Fall, initial encounter  Injury of head, initial encounter  Closed odontoid fracture, initial encounter [facility])    New Prescriptions New Prescriptions   No medications on file     Julianne Rice, MD 04/14/16 1128

## 2016-04-14 NOTE — ED Notes (Addendum)
Manager from [facility] contacted this RN to inform me that pt is at end-of-life care. There should be no life saving interventions. Manager told this RN about pt's right humerus fx in January and how family choice not to pursue surgical interventions. Manager stated that pt is to  NO be admitted to the hospital, but to be sent back to SNF once her laceration on forehead is fix and make sure there is no skull depressions. Manager inform this RN that pt's Son is POA and was coming to hospital.    Family is at bedside now.

## 2016-04-15 ENCOUNTER — Encounter: Payer: Self-pay | Admitting: Adult Health

## 2016-04-15 ENCOUNTER — Non-Acute Institutional Stay (SKILLED_NURSING_FACILITY): Payer: Medicare Other | Admitting: Adult Health

## 2016-04-15 DIAGNOSIS — F411 Generalized anxiety disorder: Secondary | ICD-10-CM | POA: Diagnosis not present

## 2016-04-15 DIAGNOSIS — M79642 Pain in left hand: Secondary | ICD-10-CM | POA: Diagnosis not present

## 2016-04-15 DIAGNOSIS — S12100A Unspecified displaced fracture of second cervical vertebra, initial encounter for closed fracture: Secondary | ICD-10-CM | POA: Diagnosis not present

## 2016-04-15 DIAGNOSIS — R296 Repeated falls: Secondary | ICD-10-CM | POA: Diagnosis not present

## 2016-04-15 NOTE — Progress Notes (Signed)
Patient ID: Cathy Wallace, female   DOB: [birth year], 82 y.o.   MRN: [id number]  Location:   [place]:  SNF (31)  Provider:   Cindi Carbon, ANP [place] [phone]   REED, Jonelle Sidle, DO  [name], DO as PCP - General (Geriatric Medicine) [facility]  Extended Emergency Contact Information Primary Emergency Contact: Nadel,Beverly Address: [place] [address] Johnnette Litter of [place] Phone: [phone] Work Phone: [phone] Mobile Phone: [phone] Relation: Daughter Secondary Emergency Contact: Escalona,Larry Address: [address]          [place],  [address] Johnnette Litter of [place] Phone: [phone] Work Phone: [phone] Relation: Other  Code Status:  DNR Goals of care: Advanced Directive information Advanced Directives 04/14/2016  Does patient have an advance directive? Yes  Type of Advance Directive Out of facility DNR (pink MOST or yellow form)  Does patient want to make changes to advanced directive? -  Copy of advanced directive(s) in chart? Yes  Pre-existing out of facility DNR order (yellow form or pink MOST form) Physician notified to receive inpatient order     Chief Complaint  Patient presents with  . Acute Visit    Fall follow-up     HPI:  Cathy Wallace is a 82 y.o. female seen today for a follow-up visit for a fall she sustained on 04/14/16. Staff reports that pt had a delirium episode yesterday in which she screamed constantly, which staff reports was different from her prior episodes of anxiety and agitation. During that time she attempted to get oob without help.  Staff reports she had a a temp of 100.7 on 10/15, which has resolved with no additional findings . She was transferred to ED due to laceration on head and swelling at the back of her neck. Ct Head Wo Contrast revealed  "1. No acute  intracranial abnormality. 2. Cortical disruption through the anterior odontoid process. This is consistent with a fracture but is unusual given the lack of extension through the posterior cortex and the lack of displacement or adjacent soft tissue swelling. No other fractures or significant malalignment." Pt was offered a cervical collar, but family declined. Family notified of potential life-threatening complications from possible odontoid fracture.  Pt has been getting Roxanol for pain. Staff reports she has had periods of apnea today lasting 16-18 sec. Her Roxanol was held at [facility]. She received her 8am dose. Upon examination, she would spontaneously open her eyes and appeared alert. She did have periods of apnea upon exam. She has had decline in function since her fall and her safety to swallow pills is questionable.  She has some swelling, ecchymosis to the top her L hand. She exhibits exquisite pain upon passive ROM to hand.  She has been taking Ativan for anxiety, but has shown diminished results. She was switched to klonopin on 10/13. Her dose of seroquel was increased and her buspar was d/c'ed. Staff reports that her agitation escalated since her medications were changed.   She is currently under Hospice care.She has a caregiver at bedside.   Functional status: hoyer lift, intermittently incontinent   Past Medical History:  Diagnosis Date  . Acute MI 08/10/2012   2D Echo - EF 55-60%, normal  . Alcohol dependence ([place]) 11/15/2012  . Anemia 11/15/2012  . Anginal pain ([place]) 2014  . Anxiety 2013  .  Arthritis   . Atherosclerosis 2015  . B12 deficiency 12/01/2012  . Bradycardia, on admit 08/09/2012  . Breast cancer ([place])   . CAD (coronary artery disease)   . Cancer (HCC)    hx of breast cancer  . Cardiomegaly 2015  . Cerebral atrophy 2015  . Cerebrovascular disease 2015   Small vessel disease  . Closed pelvic fracture ([place]) 11/11/2012  . Constipation 2015  . Coronary artery disease 2014  .  Depression   . DVT (deep vein thrombosis) in pregnancy ([place])   . DVT (deep venous thrombosis) ([place]) 2011    Eliquis anticoagulation  . Fracture of right superior pubic ramus ([place]) 2011   Superior and inferior pubic rami fractures  . GERD (gastroesophageal reflux disease)   . Heart attack "two years ago"  . History of fall 2014  . HLD (hyperlipidemia)   . HTN (hypertension)   . Hyperlipemia 2014  . Insomnia 12/01/2012  . Intertrochanteric fracture of left hip ([place])  11/16/48  . L5 vertebral fracture ([place]) 11/15/2012   Fracture of the L5 left transverse process.    . Left foot drop   . Long term (current) use of anticoagulants 11/15/2012  . Lumbar vertebral fracture (HCC) 06/15/2013   L3-4 12/15/20143  . Osteoarthritis 02/10/2013  . Pacemaker   . PE (pulmonary embolism)   . Pulmonary embolism [facility]) Jan 2012   Eliquis anticoagulation  . Right foot drop 2014  . Right radial fracture 2014  . Sacral fracture, closed ([place]) 11/15/2012   Bilateral nondisplaced sacral fractures.   Marland Kitchen Spinal stenosis   . Unspecified vitamin D deficiency 12/07/2012  . Unstable gait  2014  . Urinary tract infection 2015   Past Surgical History:  Procedure Laterality Date  . ABDOMINAL HYSTERECTOMY    . CARDIAC CATHETERIZATION  08/08/2012   2.5x65mm Emerge balloon predilation, 2.75x1mm VeriFLEX bare-metal stent was inserted into the RCA to cover proximal to mid segments of RCA deployed with Noncompliant Sprinter balloon 2.75x67mm up to 2.62mm dilated  . CATARACT EXTRACTION Bilateral   . ELBOW SURGERY    . HIP FRACTURE SURGERY    . INTRAMEDULLARY (IM) NAIL INTERTROCHANTERIC Left 11/18/2013   Procedure: IM NAIL  LEFT HIP;  Surgeon: Rozanna Box, MD;  Location: [place];  Service: Orthopedics;  Laterality: Left;  . LEFT HEART CATHETERIZATION WITH CORONARY ANGIOGRAM N/A 08/08/2012   Procedure: LEFT HEART CATHETERIZATION WITH CORONARY ANGIOGRAM;  Surgeon: Troy Sine, MD;  Location: [facility] CATH LAB;  Service:  Cardiovascular;  Laterality: N/A;  . ORIF WRIST FRACTURE Right 07/03/2013   Procedure: OPEN REDUCTION INTERNAL FIXATION (ORIF) RIGHT WRIST FRACTURE;  Surgeon: Linna Hoff, MD;  Location: [place];  Service: Orthopedics;  Laterality: Right;  . PERCUTANEOUS CORONARY STENT INTERVENTION (PCI-S) N/A 08/08/2012   Procedure: PERCUTANEOUS CORONARY STENT INTERVENTION (PCI-S);  Surgeon: Troy Sine, MD;  Location: [facility] CATH LAB;  Service: Cardiovascular;  Laterality: N/A;    Allergies  Allergen Reactions  . Aspirin Other (See Comments)    taking xarelto  . Codeine Nausea And Vomiting  . Aspirin Other (See Comments)    Pt taking xarelto   . Benadryl [Diphenhydramine Hcl] Other (See Comments)    unknown  . Benadryl [Diphenhydramine] Other (See Comments)    unknown  . Codeine Other (See Comments)    unknown  . Flu Virus Vaccine Other (See Comments)    unknown  . Penicillins Rash  . Pneumococcal Vaccines Other (See Comments)    unknown  . Pneumococcal  Vaccines Other (See Comments)    unknown      Medication List       Accurate as of 04/15/16 11:19 AM. Always use your most recent med list.          acetaminophen 650 MG suppository Commonly known as:  TYLENOL Place 650 mg rectally every 4 (four) hours as needed for fever (fever greater than 100).   bisacodyl 10 MG suppository Commonly known as:  DULCOLAX Place 10 mg rectally as needed for moderate constipation.   clonazePAM 1 MG tablet Commonly known as:  KLONOPIN Take 1 mg by mouth See admin instructions. Take 1 mg by mouth in the morning, take 1 mg by mouth at 1400 and take 1 mg by mouth at bedtime.   feeding supplement Liqd Take 1 Container by mouth every morning.   fluticasone 50 MCG/ACT nasal spray Commonly known as:  FLONASE Place 2 sprays into both nostrils daily.   hydrOXYzine 25 MG tablet Commonly known as:  ATARAX/VISTARIL Take 1 tablet (25 mg total) by mouth every 8 (eight) hours as needed.   loperamide 2 MG  tablet Commonly known as:  IMODIUM A-D Take 4 mg by mouth daily as needed for diarrhea or loose stools.   morphine CONCENTRATE 10 mg / 0.5 ml concentrated solution Take 10 mg by mouth 4 (four) times daily. Roxanol q 2 hrs prn   nitroGLYCERIN 0.4 MG SL tablet Commonly known as:  NITROSTAT Place 1 tablet (0.4 mg total) under the tongue every 5 (five) minutes x 3 doses as needed for chest pain.   oxyCODONE-acetaminophen 5-325 MG tablet Commonly known as:  PERCOCET/ROXICET Take one tablet by mouth every 4 hours as needed for pain scale 1-5; Take two tablets by mouth every 4 hours as needed for pain scale 6-10   QUEtiapine 25 MG tablet Commonly known as:  SEROQUEL Take 25 mg by mouth 2 (two) times daily.   SENNA-PLUS 8.6-50 MG tablet Generic drug:  senna-docusate Take 2 tablets by mouth 2 (two) times daily. Hold for loose stools   sertraline 100 MG tablet Commonly known as:  ZOLOFT Take 150 mg by mouth daily.   temazepam 15 MG capsule Commonly known as:  RESTORIL Take 1 capsule (15 mg total) by mouth at bedtime.       Review of Systems  Unable to perform ROS: Dementia    Immunization History  Administered Date(s) Administered  . Tdap 07/02/2015   Pertinent  Health Maintenance Due  Topic Date Due  . DEXA SCAN  02/26/1984  . PNA vac Low Risk Adult (1 of 2 - PCV13) 02/26/1984  . INFLUENZA VACCINE  01/30/2016   Fall Risk  03/23/2015 12/15/2014  Falls in the past year? Yes No  Number falls in past yr: 1 -  Risk for fall due to : History of fall(s) Impaired balance/gait   Functional Status Survey:    Vitals:   04/15/16 1029  BP: (!) 152/75  Pulse: 67  Resp: 16  Temp: 98.2 F (36.8 C)  SpO2: 95%   There is no height or weight on file to calculate BMI.  Wt Readings from Last 3 Encounters:  04/12/16 113 lb (51.3 kg)  03/18/16 108 lb 12.8 oz (49.4 kg)  02/15/16 103 lb 6.4 oz (46.9 kg)    Physical Exam  Constitutional: No distress.  HENT:  Steri-strips to  wound in mid forehead. Ecchymoses noted to forehead.  Oropharynx dry  Eyes: Conjunctivae are normal. Pupils are equal, round, and reactive  to light. Right eye exhibits no discharge. Left eye exhibits no discharge.  Neck: No JVD present.  Neck supported by pillows. No bony abnormalities palpated.   Cardiovascular: Normal rate and regular rhythm.   No murmur heard. Trace edema ot both arms, face puffy  Pulmonary/Chest: Effort normal and breath sounds normal. No respiratory distress. She has no wheezes. She exhibits no tenderness.  Musculoskeletal: She exhibits edema and tenderness.  Right wrist drop, right foot drop. She is wearing a brace to R upper arm. She has ecchymosis, swelling to top of L hand. Mild tremor to L arm.  Lymphadenopathy:    She has no cervical adenopathy.  Neurological:  Non-verbal. Opens eyes spontaneously. Tracks appropriately with eyes.   Skin: Skin is warm and dry. She is not diaphoretic.  Psychiatric: She has a normal mood and affect.  Nursing note and vitals reviewed.   Labs reviewed:  Recent Labs  06/27/15 2354 06/28/15 0600 08/15/15 0755  NA 139 139 141  K 3.5 3.6 3.7  CL 107 107  --   CO2 19* 24  --   GLUCOSE 169* 120*  --   BUN 14 14 17   CREATININE 0.93 0.90 0.6  CALCIUM 8.7* 8.6*  --     Recent Labs  06/28/15 0600 08/15/15 0755  AST 18 17  ALT 13* 12  ALKPHOS 77 114  BILITOT 0.6  --   PROT 5.2*  --   ALBUMIN 3.2*  --     Recent Labs  06/27/15 2354 06/28/15 0600 07/04/15 07/11/15 08/15/15 0755  WBC 11.7* 9.8 8.2 8.5 6.8  NEUTROABS 10.2*  --   --   --   --   HGB 12.4 11.1* 8.6* 9.4* 11.9*  HCT 38.4 33.7* 26* 28* 37  MCV 97.0 96.8  --   --   --   PLT 183 180 266  --  255   Lab Results  Component Value Date   TSH 2.082 08/10/2012   Lab Results  Component Value Date   HGBA1C 6.1 (H) 08/10/2012   Lab Results  Component Value Date   CHOL  07/26/2010    191        ATP III CLASSIFICATION:  <200     mg/dL   Desirable   200-239  mg/dL   Borderline High  >=240    mg/dL   High          HDL 93 07/26/2010   LDLCALC  07/26/2010    83        Total Cholesterol/HDL:CHD Risk Coronary Heart Disease Risk Table                     Men   Women  1/2 Average Risk   3.4   3.3  Average Risk       5.0   4.4  2 X Average Risk   9.6   7.1  3 X Average Risk  23.4   11.0        Use the calculated Patient Ratio above and the CHD Risk Table to determine the patient's CHD Risk.        ATP III CLASSIFICATION (LDL):  <100     mg/dL   Optimal  100-129  mg/dL   Near or Above                    Optimal  130-159  mg/dL   Borderline  160-189  mg/dL   High  >190  mg/dL   Very High   TRIG 77 07/26/2010   CHOLHDL 2.1 07/26/2010    Significant Diagnostic Results in last 30 days:  Ct Head Wo Contrast  Result Date: 04/14/2016 CLINICAL DATA:  Pain after fall EXAM: CT HEAD WITHOUT CONTRAST CT CERVICAL SPINE WITHOUT CONTRAST TECHNIQUE: Multidetector CT imaging of the head and cervical spine was performed following the standard protocol without intravenous contrast. Multiplanar CT image reconstructions of the cervical spine were also generated. COMPARISON:  June 27, 2015 and June 14, 2013 FINDINGS: CT HEAD FINDINGS Brain: No subdural, epidural, or subarachnoid hemorrhage. The ventricles and sulci are prominent but stable. The cerebellum, brainstem, and basal cisterns are unchanged and unremarkable. Moderate to severe white matter changes persist without significant change. No acute cortical ischemia or infarct. No mass, mass effect, or midline shift. Vascular: Calcified atherosclerosis is seen in the intracranial carotid arteries. Skull: Normal. Negative for fracture or focal lesion. Sinuses/Orbits: There is fluid in the sphenoid sinus. Paranasal sinuses, mastoid air cells, and middle ears are otherwise normal. Other: There is a hematoma over the midline of the forehead. Extracranial soft tissues are otherwise normal. CT CERVICAL  SPINE FINDINGS Alignment: Minimal anterolisthesis of C7 versus T1 is stable since 2014. No other malalignment. Skull base and vertebrae: Cortical disruption through the anterior odontoid process, just inferior to the anterior arch of C1. No adjacent soft tissue swelling. No extension seen through the posterior cortex. No other fractures identified. Soft tissues and spinal canal: No prevertebral fluid or swelling. No visible canal hematoma. Disc levels:  Multilevel degenerative changes. Upper chest: Scarring in the left apex. Other: No other abnormalities. IMPRESSION: 1. No acute intracranial abnormality. 2. Cortical disruption through the anterior odontoid process. This is consistent with a fracture but is unusual given the lack of extension through the posterior cortex and the lack of displacement or adjacent soft tissue swelling. No other fractures or significant malalignment. Findings called to Dr.  Rutherford Nail Electronically Signed   By: Dorise Bullion III M.D   On: 04/14/2016 10:47   Ct Cervical Spine Wo Contrast  Result Date: 04/14/2016 CLINICAL DATA:  Pain after fall EXAM: CT HEAD WITHOUT CONTRAST CT CERVICAL SPINE WITHOUT CONTRAST TECHNIQUE: Multidetector CT imaging of the head and cervical spine was performed following the standard protocol without intravenous contrast. Multiplanar CT image reconstructions of the cervical spine were also generated. COMPARISON:  June 27, 2015 and June 14, 2013 FINDINGS: CT HEAD FINDINGS Brain: No subdural, epidural, or subarachnoid hemorrhage. The ventricles and sulci are prominent but stable. The cerebellum, brainstem, and basal cisterns are unchanged and unremarkable. Moderate to severe white matter changes persist without significant change. No acute cortical ischemia or infarct. No mass, mass effect, or midline shift. Vascular: Calcified atherosclerosis is seen in the intracranial carotid arteries. Skull: Normal. Negative for fracture or focal lesion.  Sinuses/Orbits: There is fluid in the sphenoid sinus. Paranasal sinuses, mastoid air cells, and middle ears are otherwise normal. Other: There is a hematoma over the midline of the forehead. Extracranial soft tissues are otherwise normal. CT CERVICAL SPINE FINDINGS Alignment: Minimal anterolisthesis of C7 versus T1 is stable since 2014. No other malalignment. Skull base and vertebrae: Cortical disruption through the anterior odontoid process, just inferior to the anterior arch of C1. No adjacent soft tissue swelling. No extension seen through the posterior cortex. No other fractures identified. Soft tissues and spinal canal: No prevertebral fluid or swelling. No visible canal hematoma. Disc levels:  Multilevel degenerative changes. Upper chest: Scarring in the  left apex. Other: No other abnormalities. IMPRESSION: 1. No acute intracranial abnormality. 2. Cortical disruption through the anterior odontoid process. This is consistent with a fracture but is unusual given the lack of extension through the posterior cortex and the lack of displacement or adjacent soft tissue swelling. No other fractures or significant malalignment. Findings called to Dr.  Rutherford Nail Electronically Signed   By: Dorise Bullion III M.D   On: 04/14/2016 10:47    Assessment/Plan 1. Closed odontoid fracture, initial encounter ([place]) -Possible closed odontoid fracture per CT on 10/15 - Family refused c-collar  - Pt under Hospice care for comfort care only -Roxanol 10 mg q 6 hrs and q 2 hrs prn  2. Hand pain, left -Swelling, ecchymosis, and exquisite pain upon movement of L hand -L hand x-ray ordered  3. Frequent falls -due to delerium and dementia, also has right foot drop -fall prec   4. Generalized anxiety disorder -Start Klonopin wafers 1 mg tid prn due to pt's decrease in cognition and inability to safely swallow medications -improved delerium  Cindi Carbon, ANP [facility] [phone]

## 2016-05-01 DEATH — deceased

## 2017-10-20 IMAGING — DX DG HUMERUS 2V *R*
1 series · 1 of 1 positions shown · non-contrast
Comparison: None.

CLINICAL DATA: Fell out of bed with arm pain, initial encounter

EXAM:
RIGHT HUMERUS - 2+ VIEW

[humerus lat]
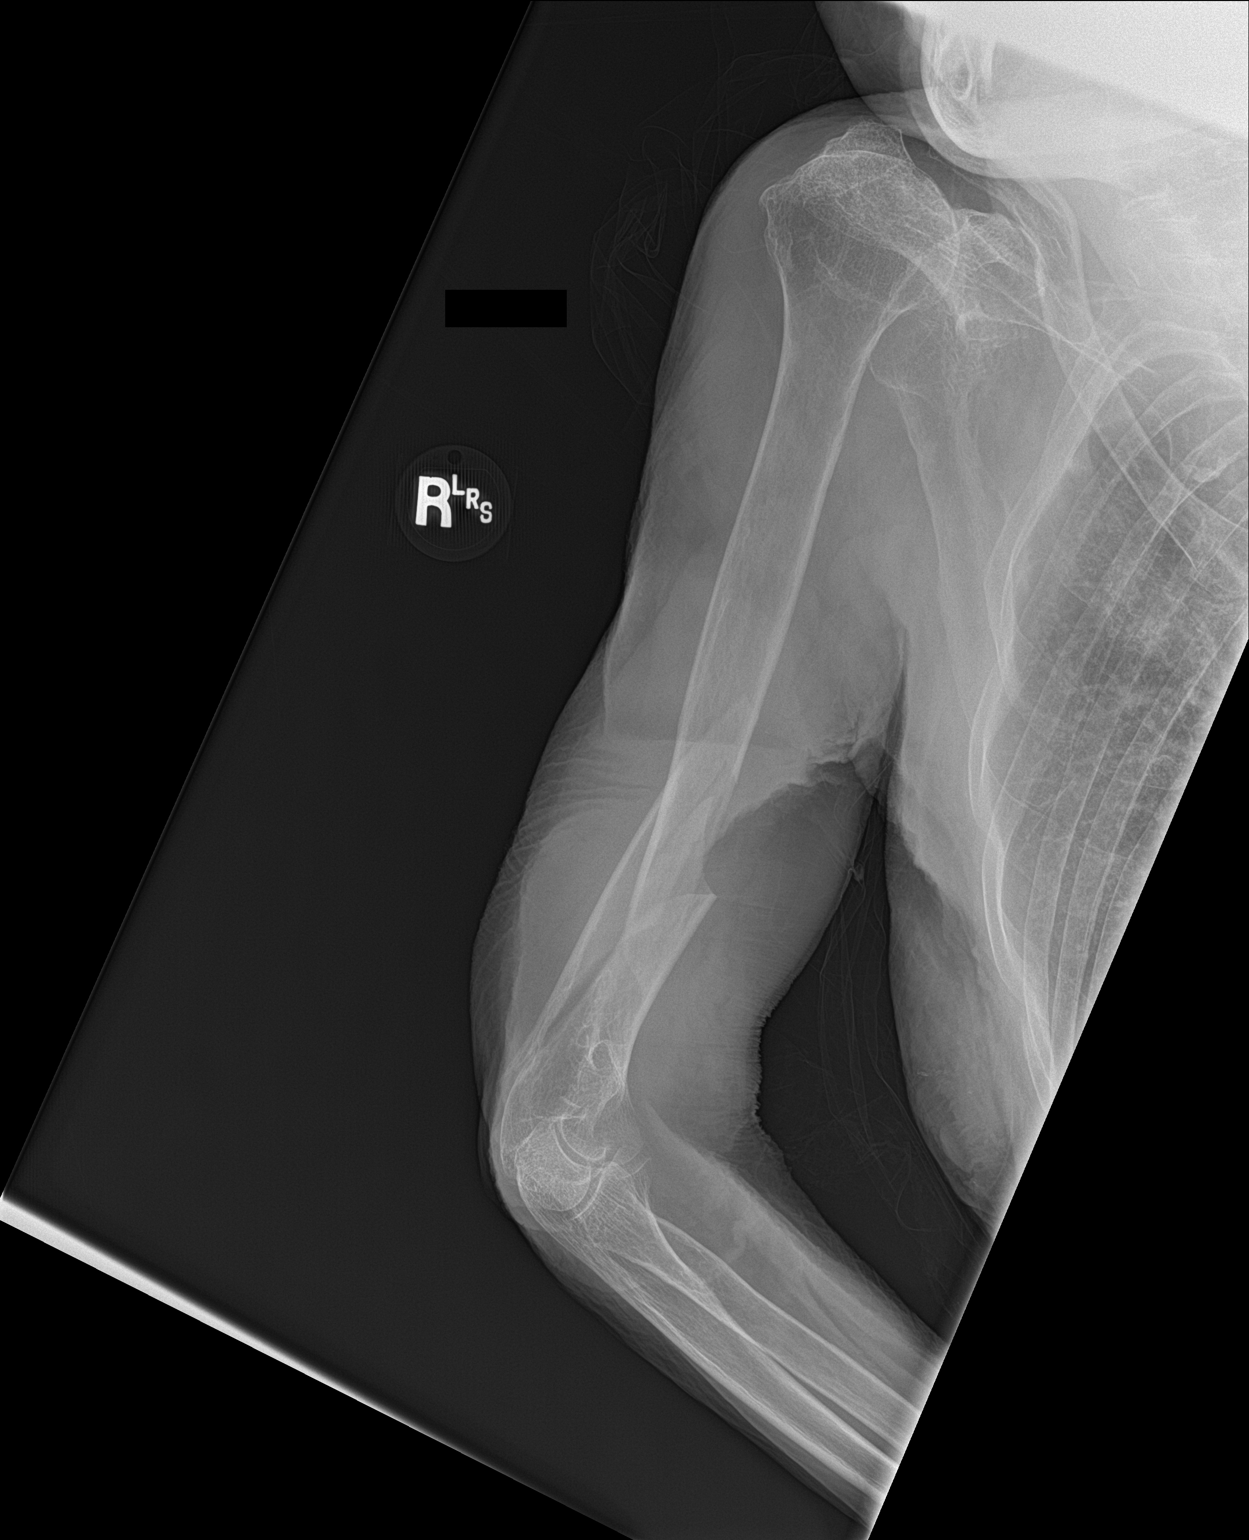

[1 of 1 positions shown; findings below may reference images not displayed]

FINDINGS: Comminuted fracture of the mid to distal humerus is noted with some
mild impaction and angulation at the fracture site. Degenerative
changes of the right shoulder joint are noted.
IMPRESSION: Mid to distal comminuted humeral fracture.
